# Patient Record
Sex: Female | Born: 1945 | Race: White | Hispanic: No | Smoking: Former smoker
Health system: Southern US, Community
[De-identification: ages and names within clinical notes are randomized; demographics above are authoritative.]

## PROBLEM LIST (undated history)

## (undated) DIAGNOSIS — I10 Essential (primary) hypertension: Secondary | ICD-10-CM

## (undated) DIAGNOSIS — M1990 Unspecified osteoarthritis, unspecified site: Secondary | ICD-10-CM

## (undated) DIAGNOSIS — J449 Chronic obstructive pulmonary disease, unspecified: Secondary | ICD-10-CM

## (undated) DIAGNOSIS — G4730 Sleep apnea, unspecified: Secondary | ICD-10-CM

## (undated) DIAGNOSIS — Z87442 Personal history of urinary calculi: Secondary | ICD-10-CM

## (undated) DIAGNOSIS — T884XXA Failed or difficult intubation, initial encounter: Secondary | ICD-10-CM

## (undated) DIAGNOSIS — K219 Gastro-esophageal reflux disease without esophagitis: Secondary | ICD-10-CM

## (undated) HISTORY — PX: BREAST EXCISIONAL BIOPSY: SUR124

## (undated) HISTORY — PX: BACK SURGERY: SHX140

## (undated) HISTORY — PX: COLONOSCOPY: SHX174

## (undated) HISTORY — PX: CHOLECYSTECTOMY: SHX55

## (undated) HISTORY — PX: SPLENECTOMY, TOTAL: SHX788

## (undated) HISTORY — PX: FRACTURE SURGERY: SHX138

## (undated) HISTORY — PX: EYE SURGERY: SHX253

## (undated) HISTORY — PX: TRUNK SKIN LESION EXCISIONAL BIOPSY: SUR474

---
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
## 1969-05-10 HISTORY — PX: BREAST BIOPSY: SHX20

## 2004-07-18 IMAGING — CR DG CHEST 1V PORT
1 series · 1 of 1 positions shown · non-contrast
Comparison: [DATE].

CLINICAL DATA: Weakness.  Lung lesion. 
PORTABLE CHEST:

[view not recorded]
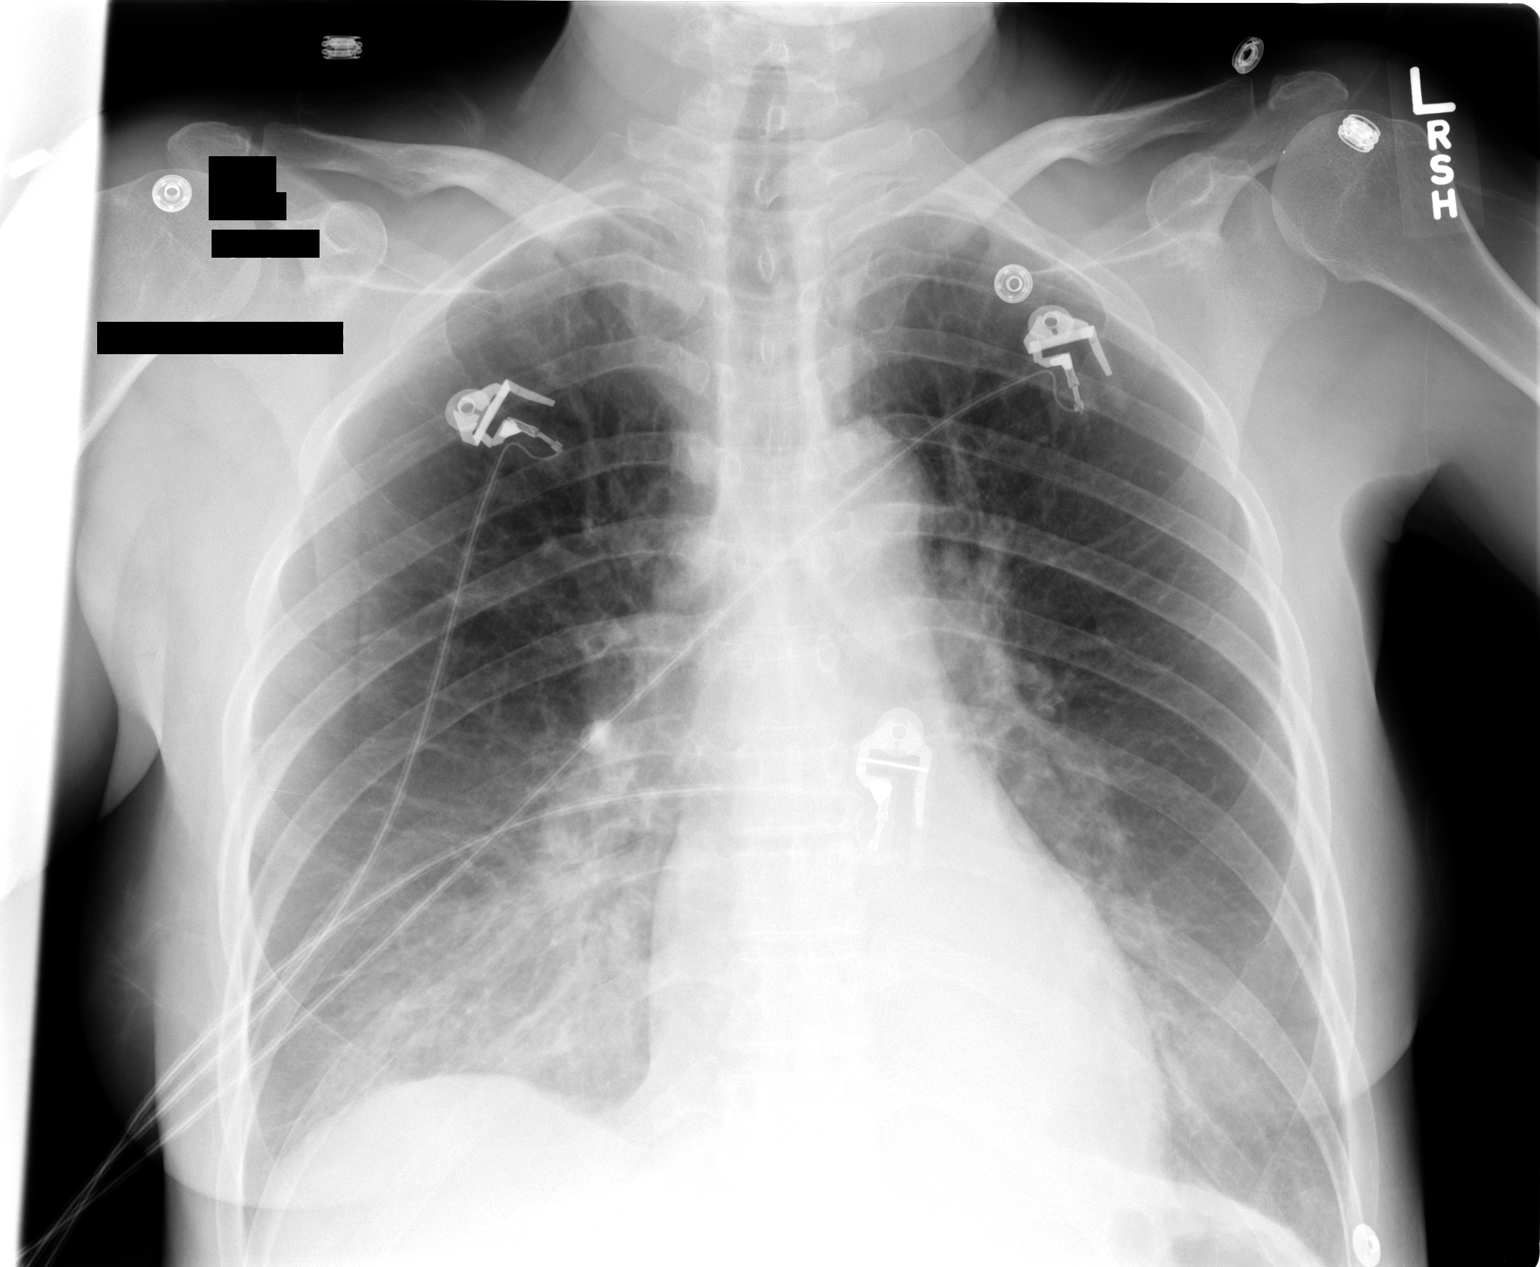

[1 of 1 positions shown; findings below may reference images not displayed]

Bilateral lower lung atelectasis / airspace disease noted.  Cardiomediastinal silhouette is stable.  There has been no interval change since the prior study.
IMPRESSION: Stable chest.

## 2005-02-01 ENCOUNTER — Ambulatory Visit: Payer: Self-pay | Admitting: Internal Medicine

## 2005-02-01 IMAGING — CT CT CHEST-ABD W/ CM
1 series · 15 of 32 positions shown, 19 images · non-contrast
Comparison: none

REASON FOR EXAM: RT sided pleurisy with pain
COMMENTS:

[Series 2: soft tissue · axial · 0.66mm/px · z∈[-610,-300]mm · 15 of 71 slices shown, 19 images]
[im 6/71  mediastinal]
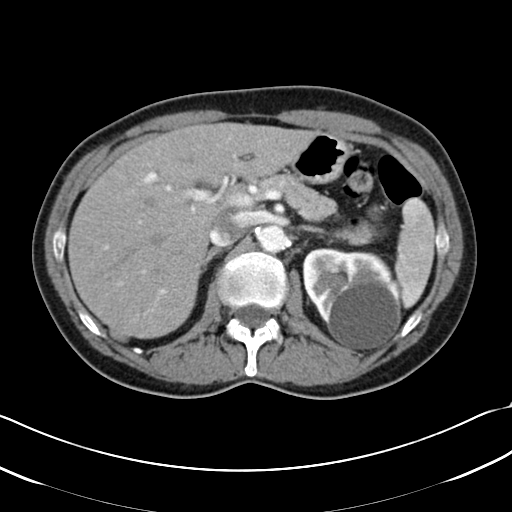
[im 6/71  bone]
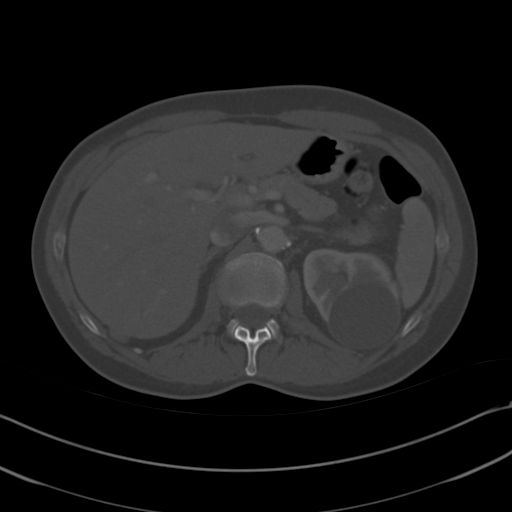
[im 11/71  mediastinal]
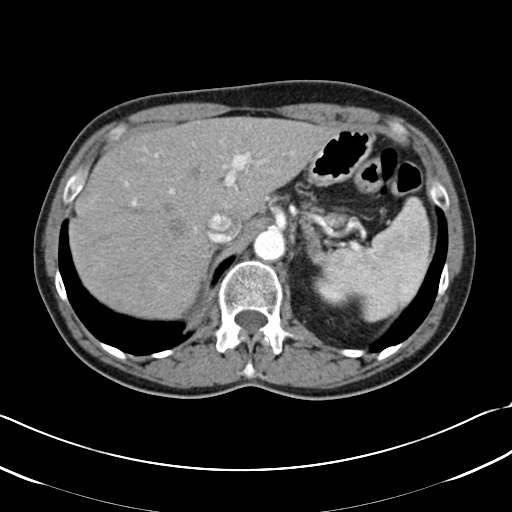
[im 15/71  mediastinal]
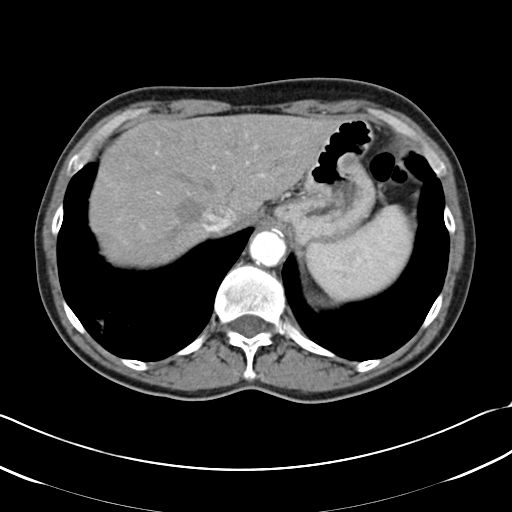
[im 21/71  mediastinal]
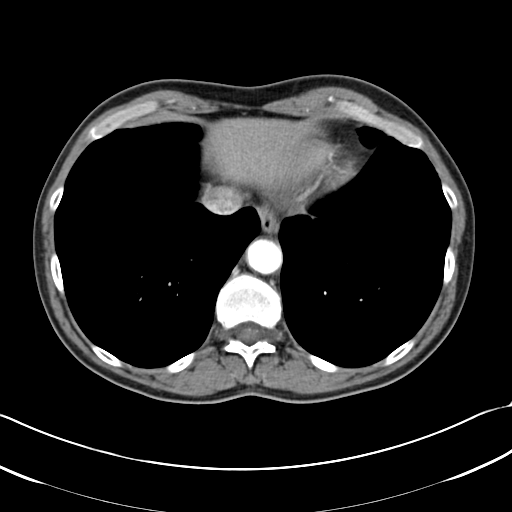
[im 26/71  mediastinal]
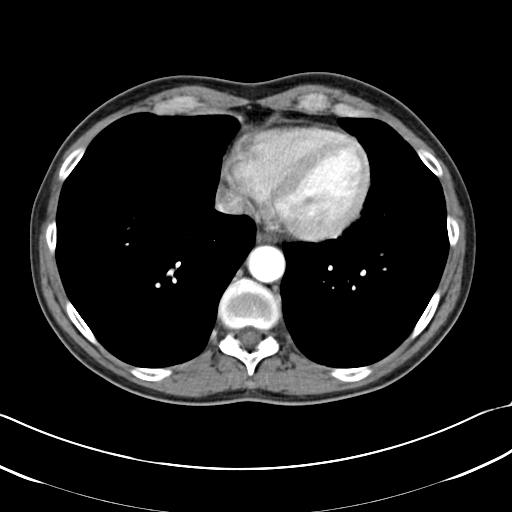
[im 32/71  mediastinal]
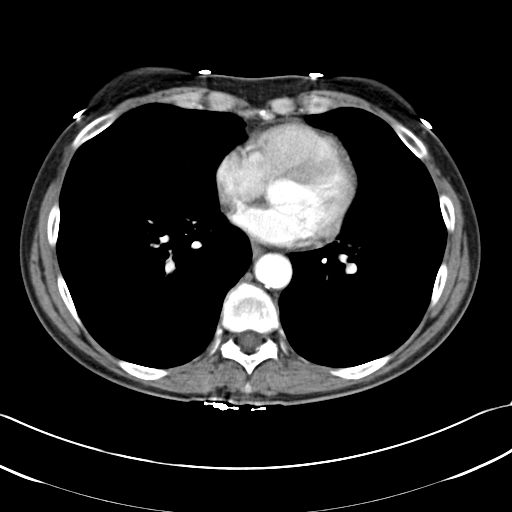
[im 38/71  mediastinal]
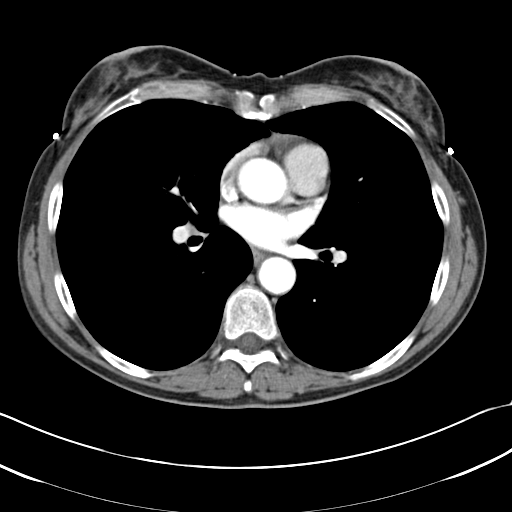
[im 42/71  mediastinal]
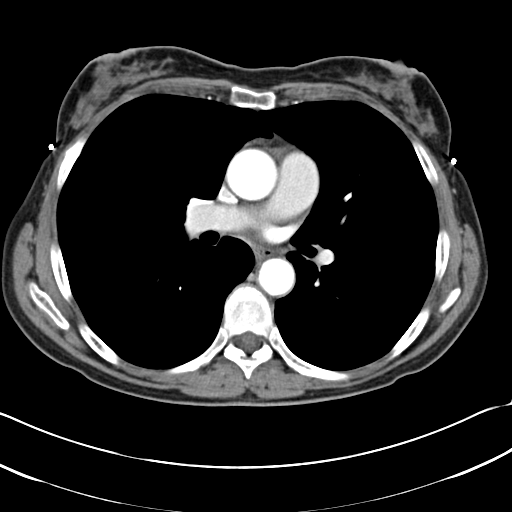
[im 45/71  mediastinal]
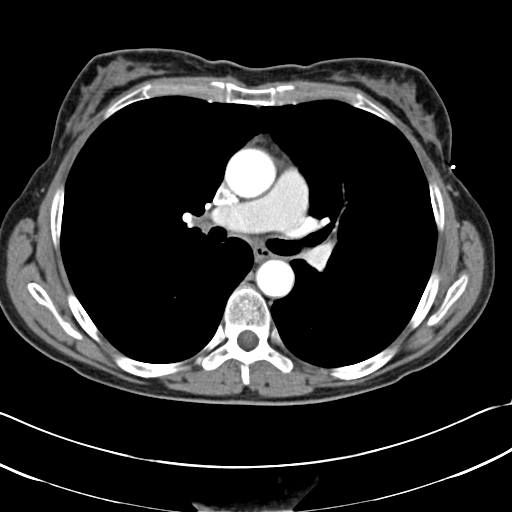
[im 45/71  bone]
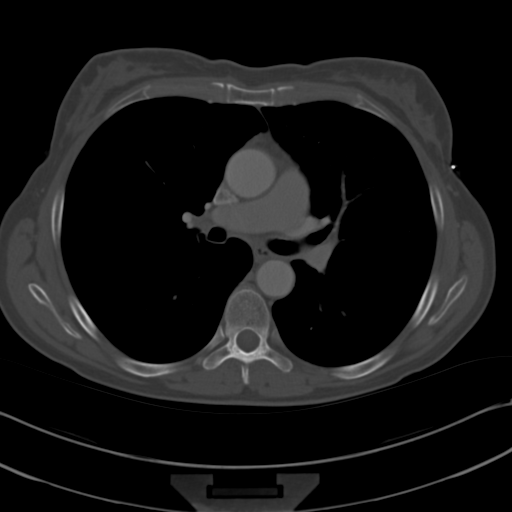
[im 50/71  mediastinal]
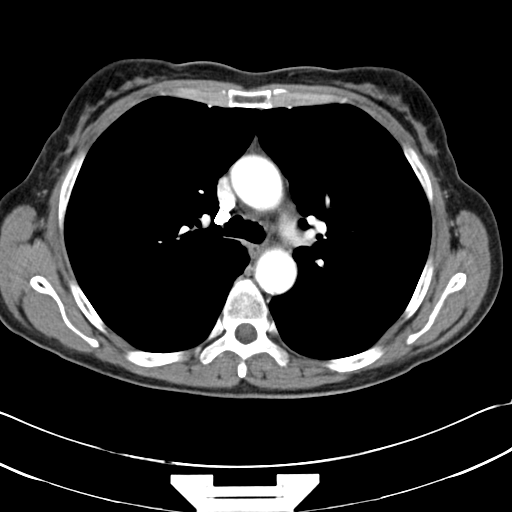
[im 57/71  mediastinal]
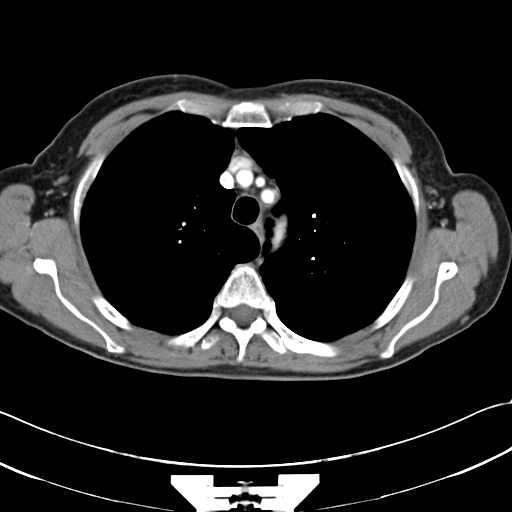
[im 60/71  mediastinal]
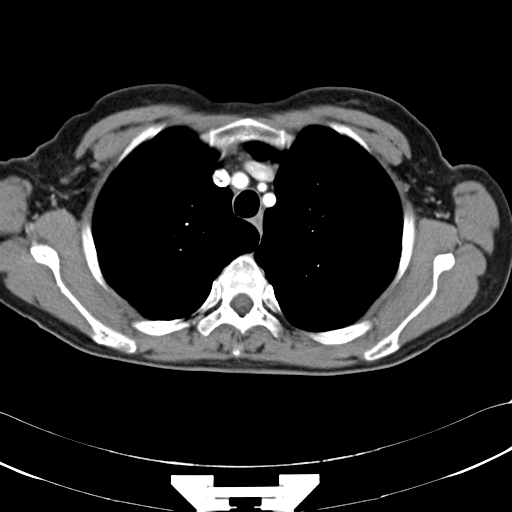
[im 60/71  lung]
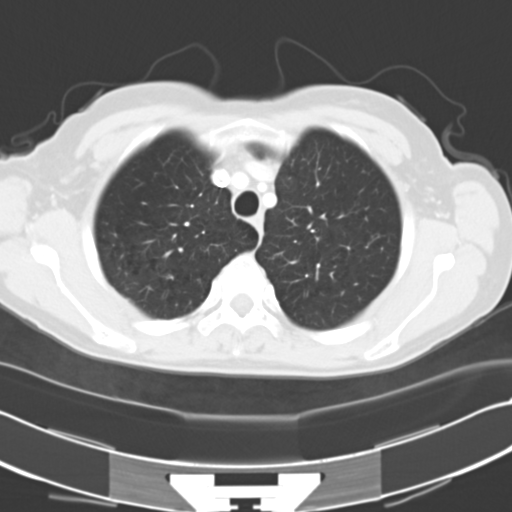
[im 63/71  lung]
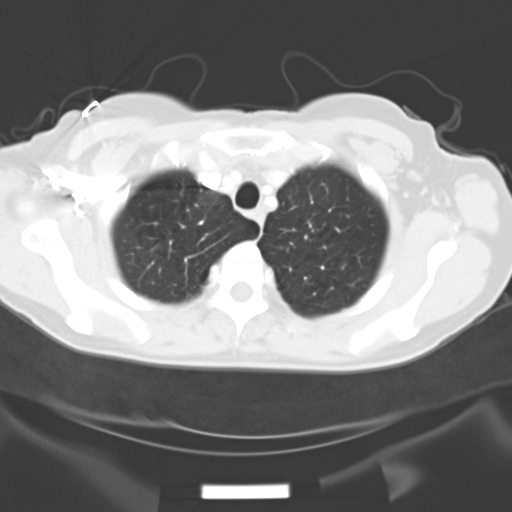
[im 65/71  mediastinal]
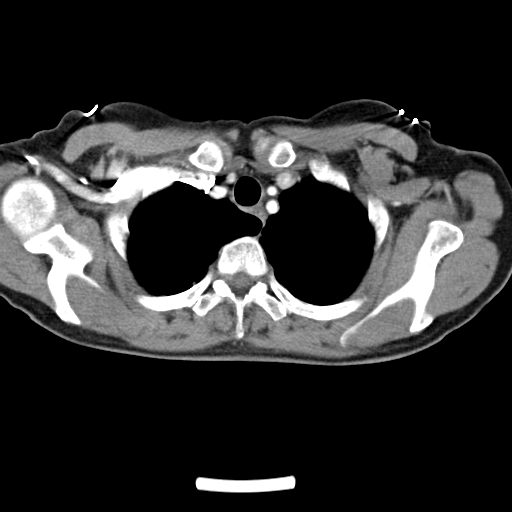
[im 65/71  lung]
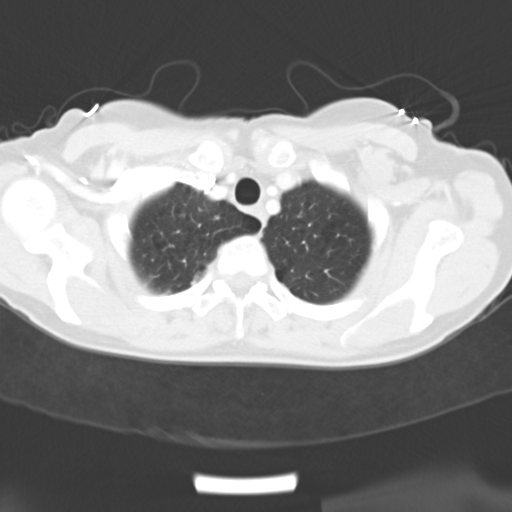
[im 68/71  lung]
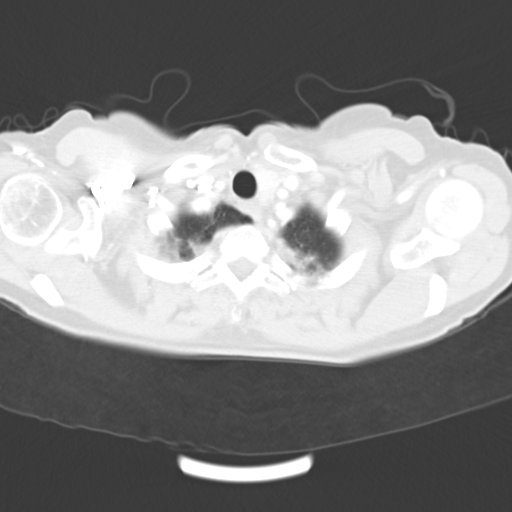

[15 of 32 positions shown; findings below may reference images not displayed]

PROCEDURE:     CT  - CT CHEST COMPLEX WITH CONTRAST  - [DATE]  [DATE]

RESULT:        Comparison is made to the study of [DATE].    CT of the chest
with IV contrast is performed along with high-resolution lung window images.
 The heart appears to be normal in size. There is no pleural effusion or
pericardial effusion evident.  There is a well-circumscribed low density
lesion within the mid to upper pole region of the LEFT kidney suggestive of
a cyst measuring approximately 4.6 cm in maximal diameter. The upper
abdominal viscera otherwise appear to be grossly normal.  There is a band of
density at the RIGHT lung base in the RIGHT lower lobe in a subpleural
location or pleural-based location which has an appearance suggestive of
possible fibrosis. An old tiny pulmonary infarct could give a similar
appearance.  Some minimal fibrosis is seen in the RIGHT costophrenic angle
region and in the RIGHT paraspinal region in the lower lobe as well.  No
other areas of possible infarct or significant fibrosis are seen aside from
some changes in the lung apices where there are some small bulla and some
areas of fibrotic banding present.  There is no focal infiltrate.  There is
no bronchiectasis.  No septal thickening or interstitial edema is seen.
IMPRESSION: 1.     The area of subtle subpleural density in the RIGHT lung base is
slightly more prominent than seen on the prior study.  The possibility of an
enlarging mass cannot be completely excluded.  The patient may benefit from
a CT PET to confirm absence of neoplasm or the presence of neoplasm.
2.     Some minimal nodular at the RIGHT lung base that appears to be
pleural based along with some other areas suggestive of fibrosis.  The area
may be secondary to a small infarct or an old area of fibrosis.  No evidence
of mediastinal or hilar mass.
3.     Incidental finding of an upper pole LEFT renal cyst.
4.     Cholecystectomy clips noted.

## 2005-02-07 ENCOUNTER — Ambulatory Visit: Payer: Self-pay | Admitting: Physical Medicine & Rehabilitation

## 2005-02-07 ENCOUNTER — Inpatient Hospital Stay (HOSPITAL_COMMUNITY): Admission: EM | Admit: 2005-02-07 | Discharge: 2005-02-19 | Payer: Self-pay | Admitting: Emergency Medicine
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
## 2005-02-08 IMAGING — CR DG FOOT COMPLETE 3+V*R*
3 series · 3 of 3 positions shown · non-contrast
Comparison: none

CLINICAL DATA: Ankle dislocation. 
 FOOT ? 3 VIEWS: 
 Through SAA the previously identified subtalar dislocation has been reduced.  There is near anatomic alignment visualized on these three films.

[view not recorded (1 of 3)]
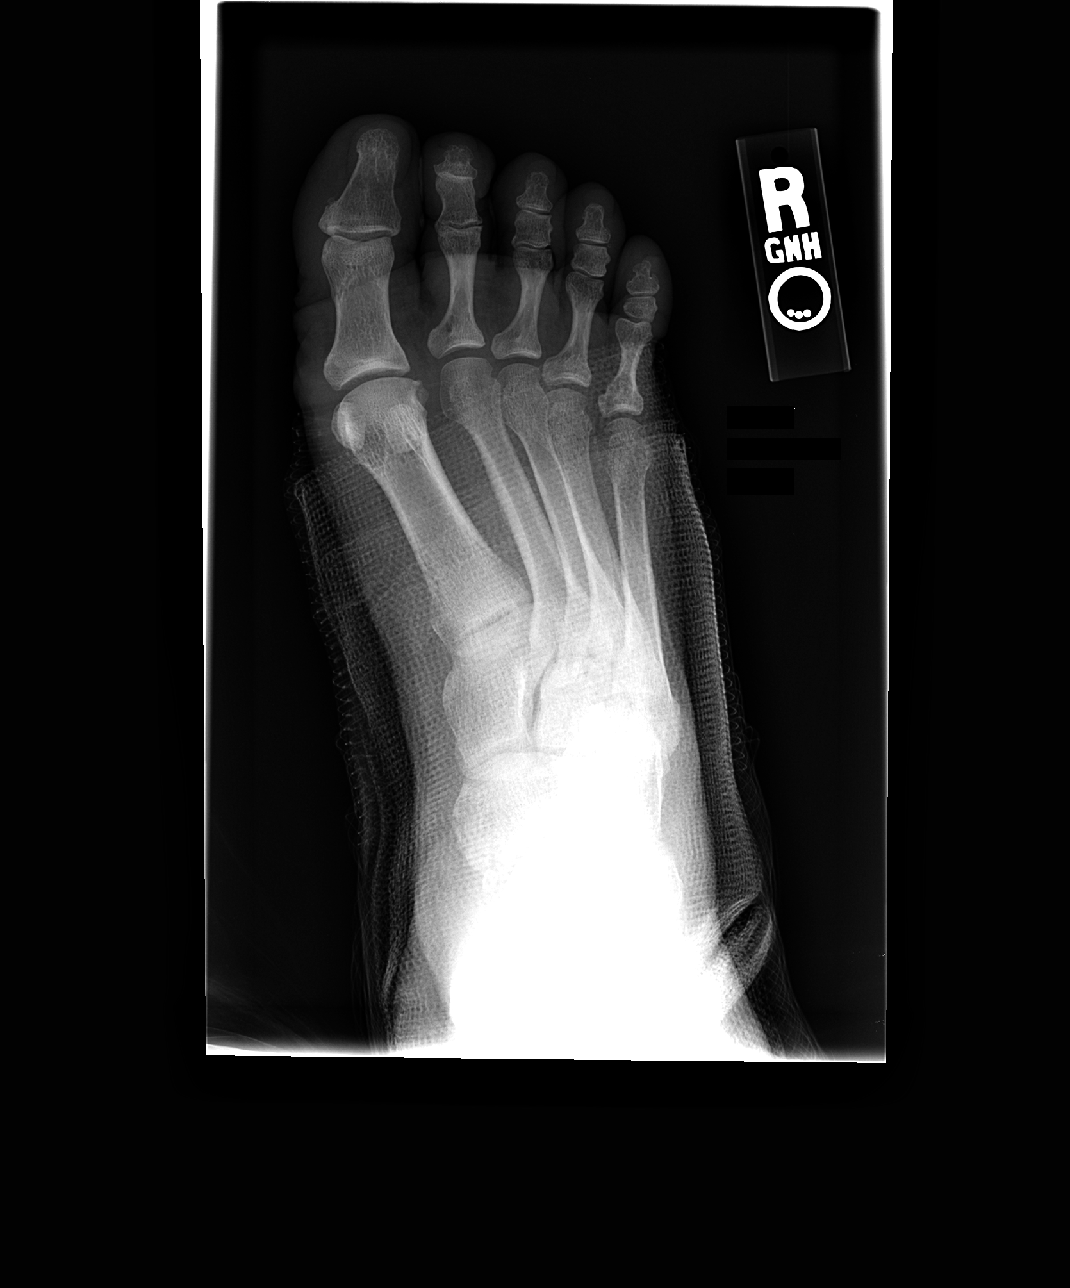

[view not recorded (2 of 3)]
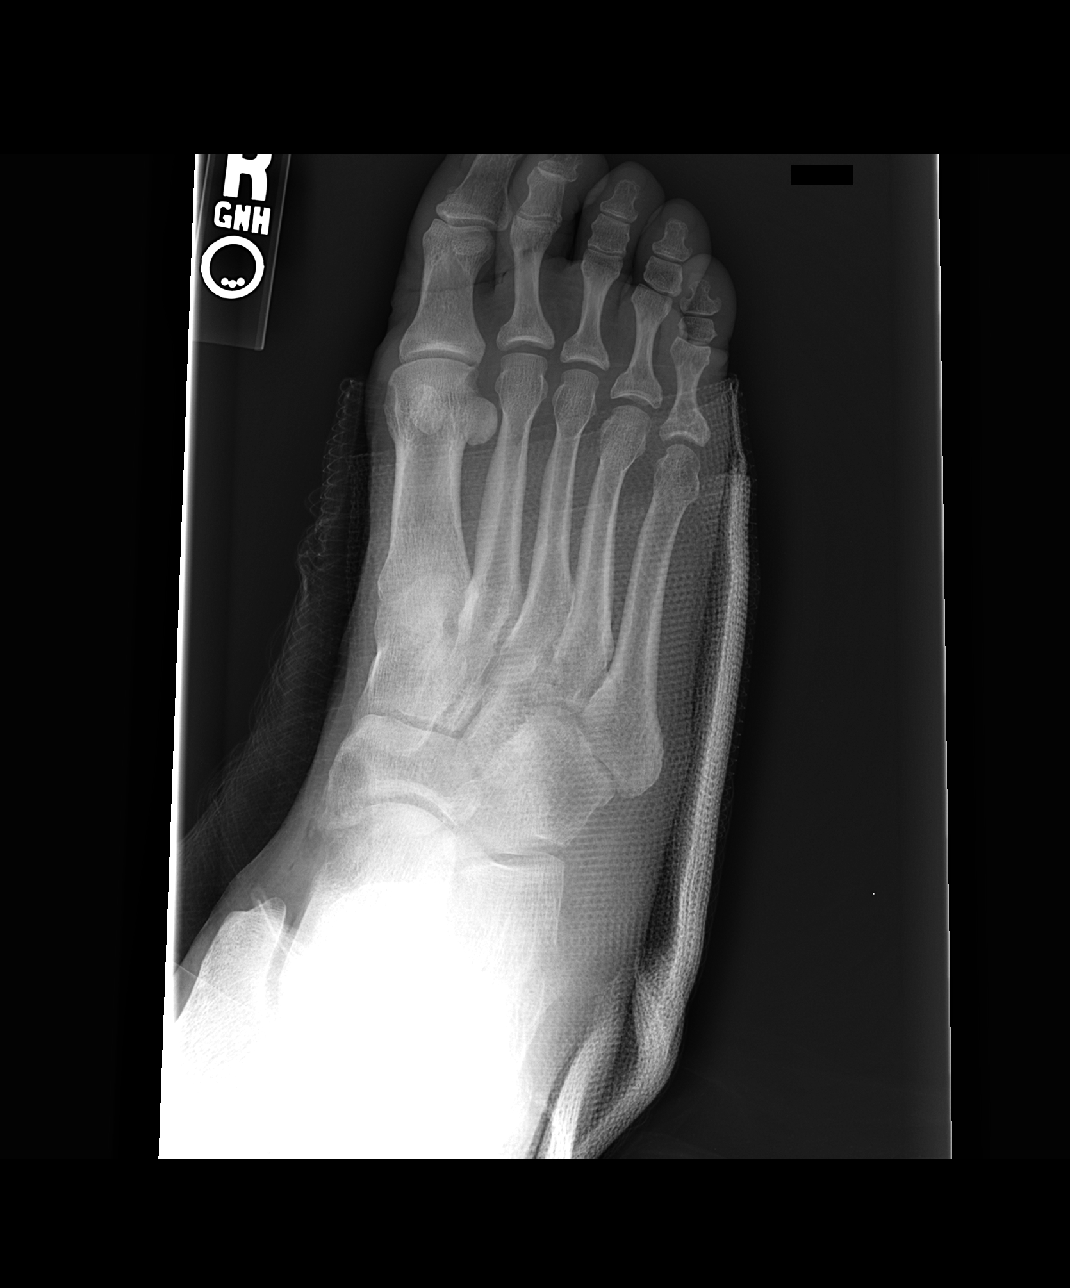

[view not recorded (3 of 3)]
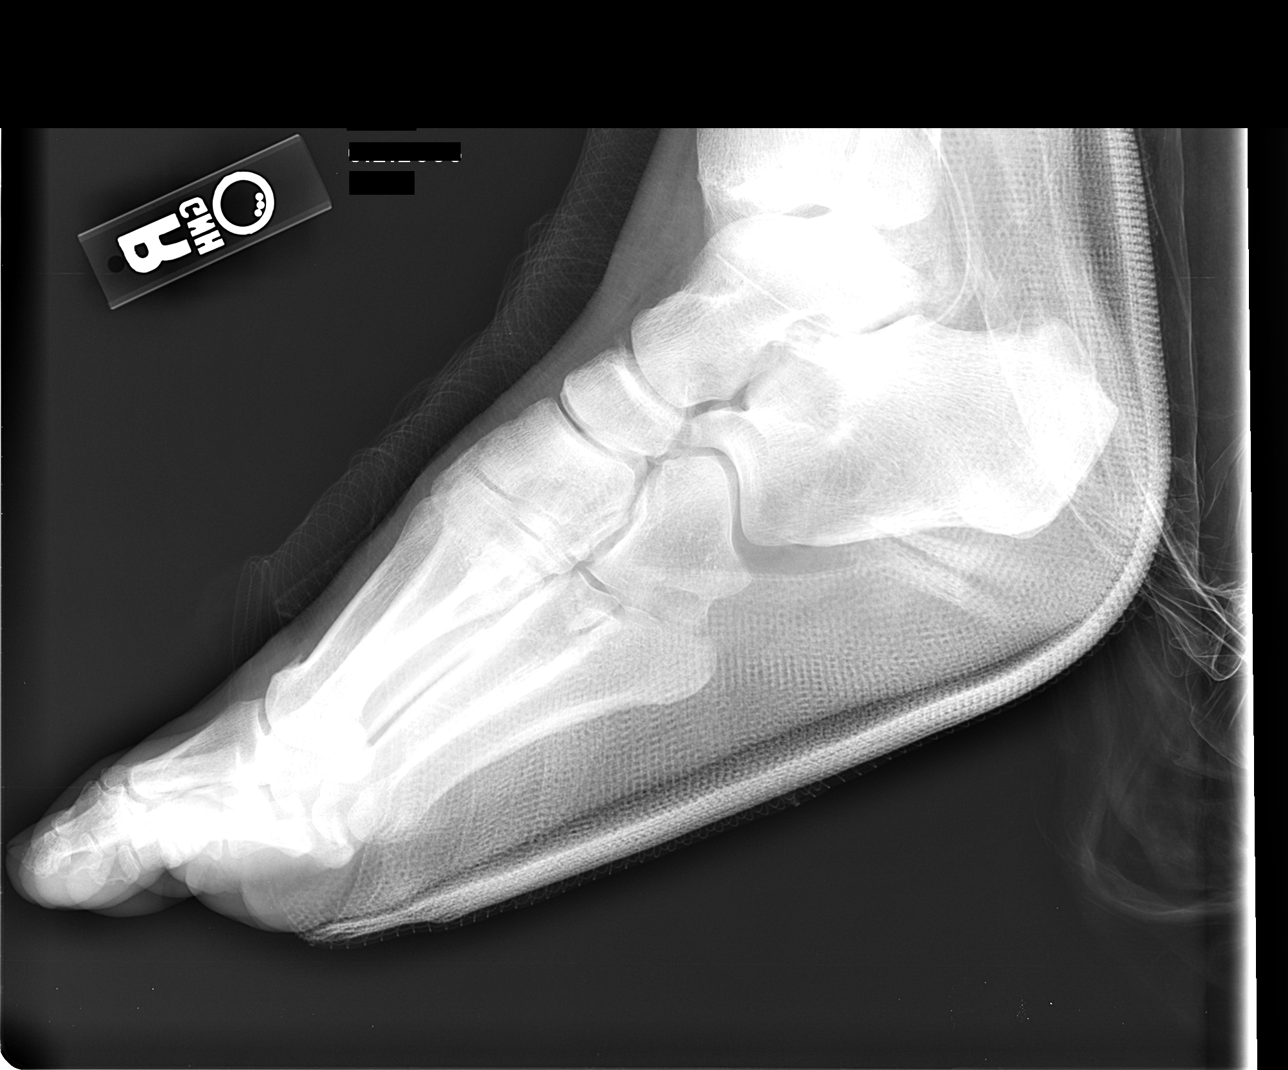

[3 of 3 positions shown; findings below may reference images not displayed]

IMPRESSION: Satisfactory appearance status-post reduction of subtalar dislocation.  
 ANKLE ? 3 VIEWS: 
 Through SAA there has been near anatomic restoration of subtalar dislocation.  Fractures of the lateral malleolus and talus are again noted.
IMPRESSION: Improved appearance status-post closed reduction.

## 2005-02-08 IMAGING — CR DG ANKLE PORT 2V*R*
3 series · 3 of 3 positions shown · non-contrast
Comparison: none

CLINICAL DATA: Ankle dislocation. 
 FOOT ? 3 VIEWS: 
 Through SAA the previously identified subtalar dislocation has been reduced.  There is near anatomic alignment visualized on these three films.

[view not recorded (1 of 3)]
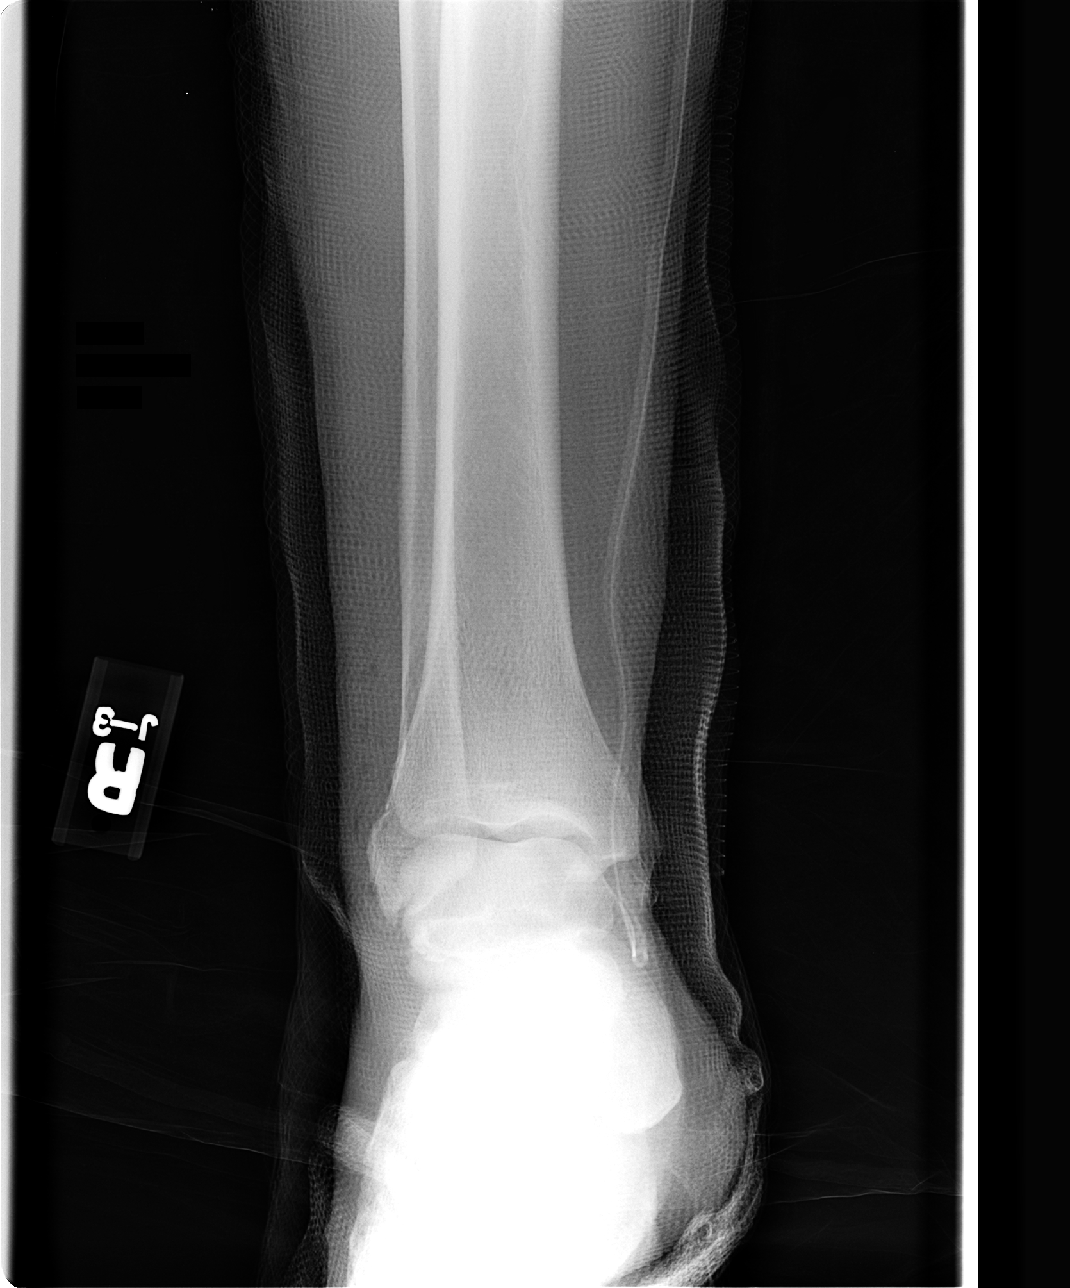

[view not recorded (2 of 3)]
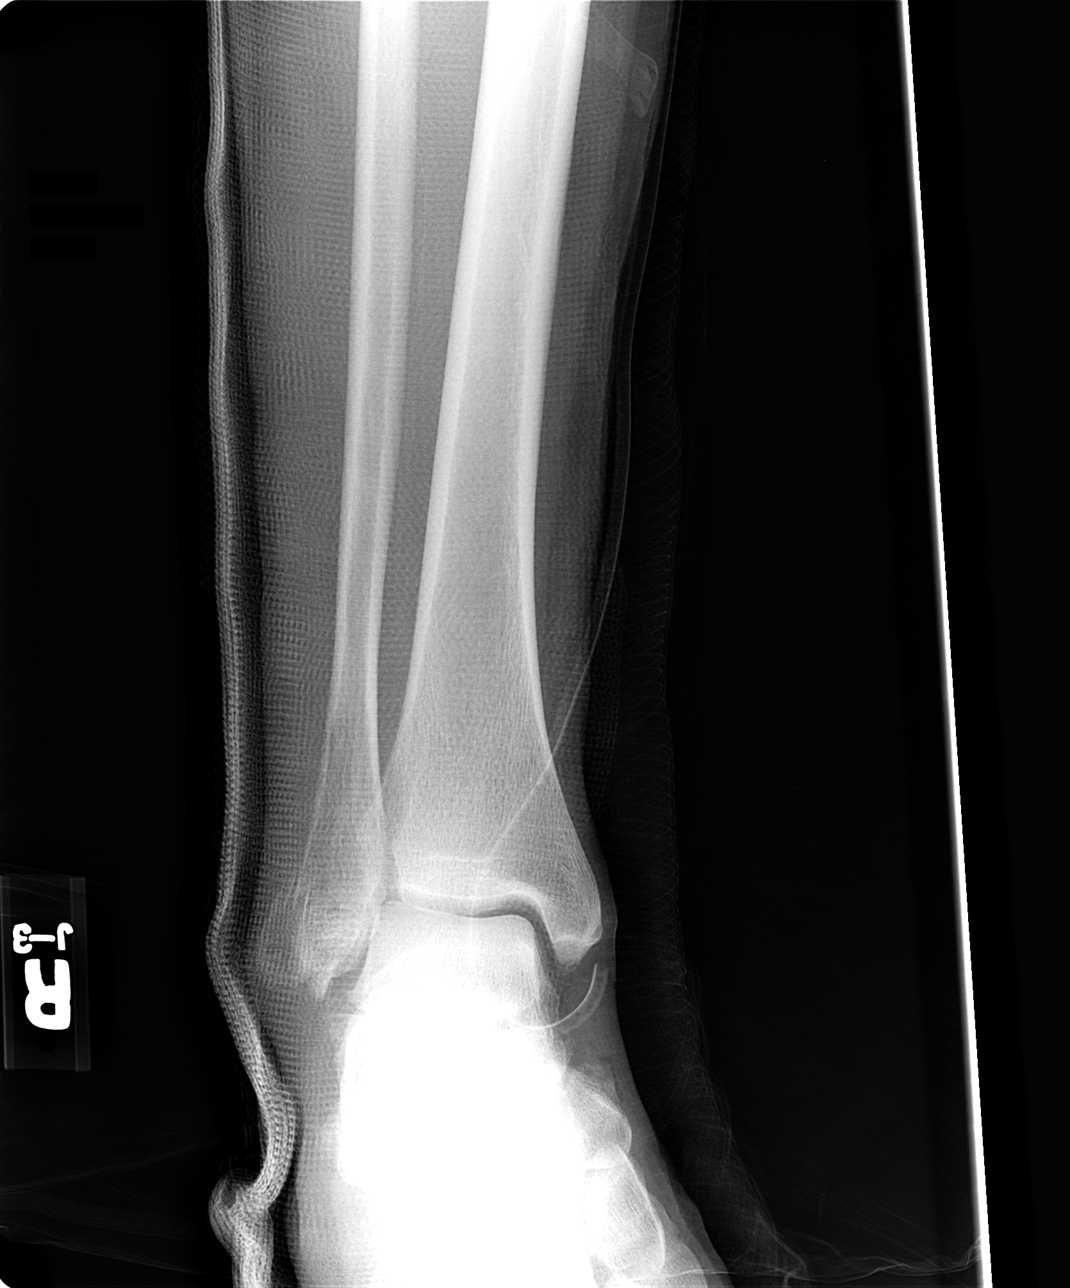

[view not recorded (3 of 3)]
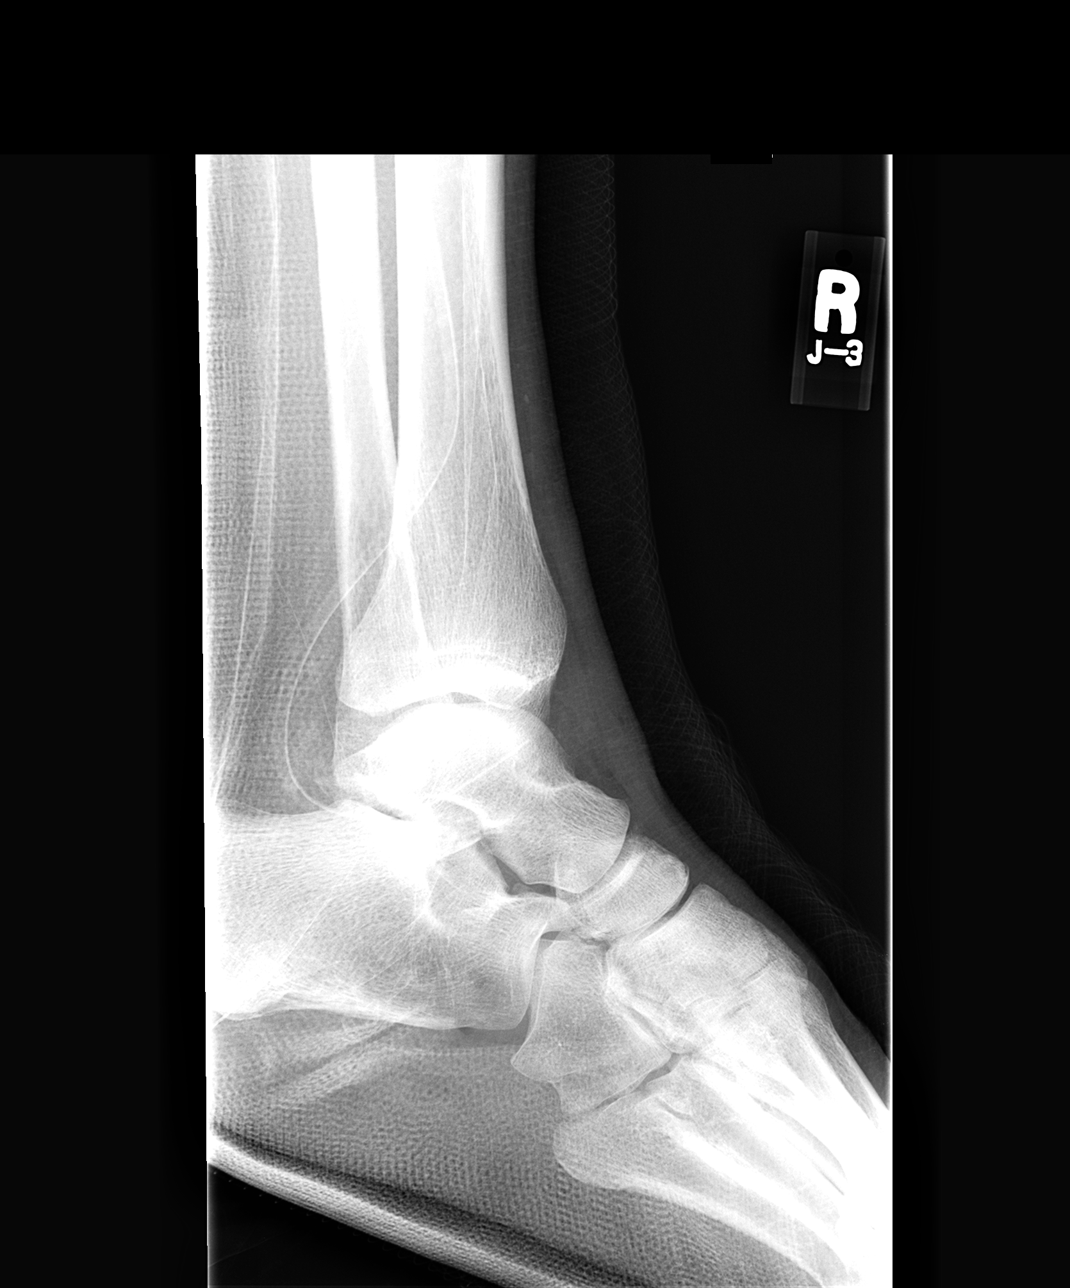

[3 of 3 positions shown; findings below may reference images not displayed]

IMPRESSION: Satisfactory appearance status-post reduction of subtalar dislocation.  
 ANKLE ? 3 VIEWS: 
 Through SAA there has been near anatomic restoration of subtalar dislocation.  Fractures of the lateral malleolus and talus are again noted.
IMPRESSION: Improved appearance status-post closed reduction.

## 2005-02-09 IMAGING — CR DG CHEST 2V
2 series · 2 of 2 positions shown · non-contrast
Comparison: none

CLINICAL DATA: Short of breath.  Surgery for ankle dislocation.
 CHEST ? 2 VIEW:
 Two views of the chest are compared to a chest x-ray of [DATE].  There are now bilateral small pleural effusions present.  Pneumonia is difficult to exclude.  The lungs appear slightly hyperaerated.  The heart is within normal limits in size.

[w chest lat]
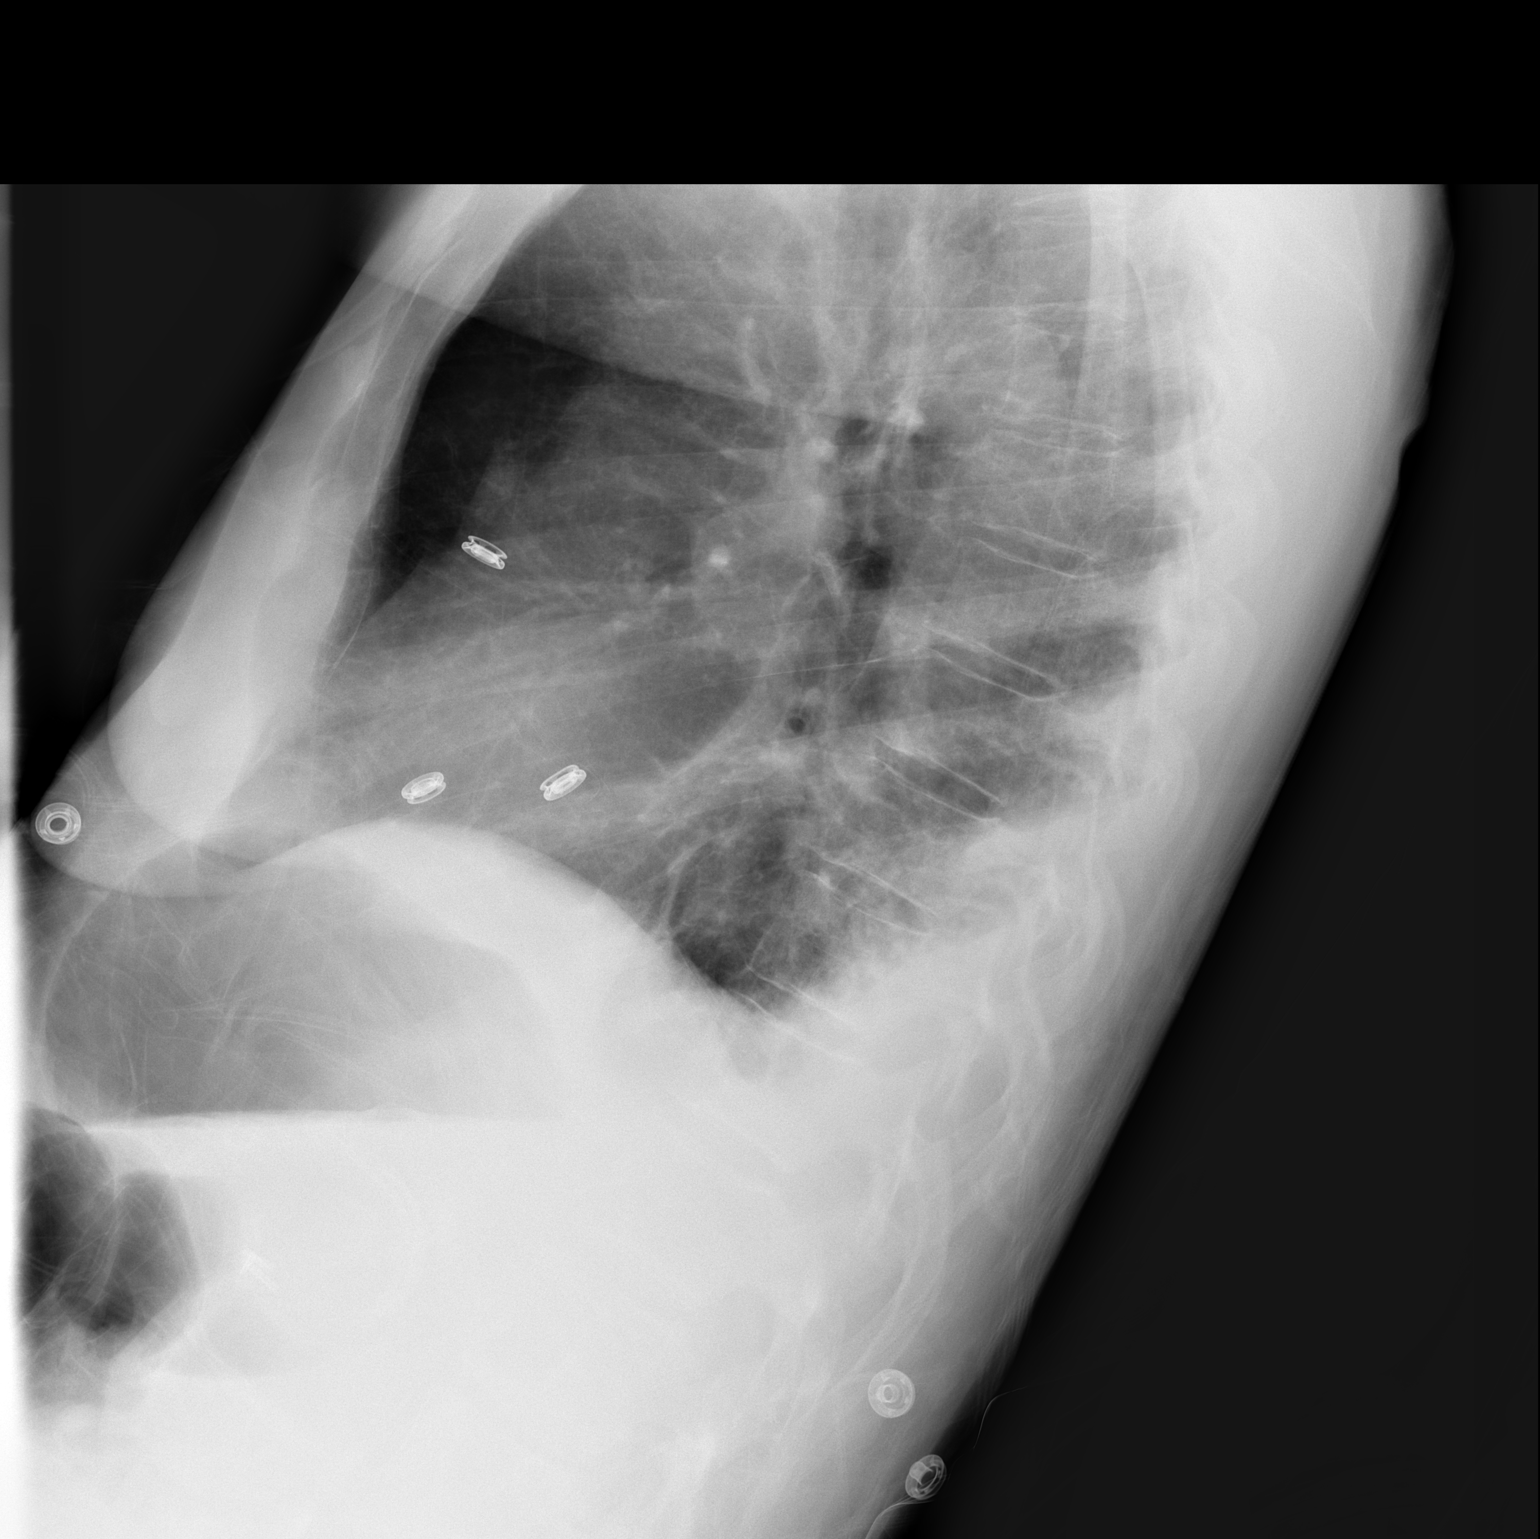

[view not recorded]
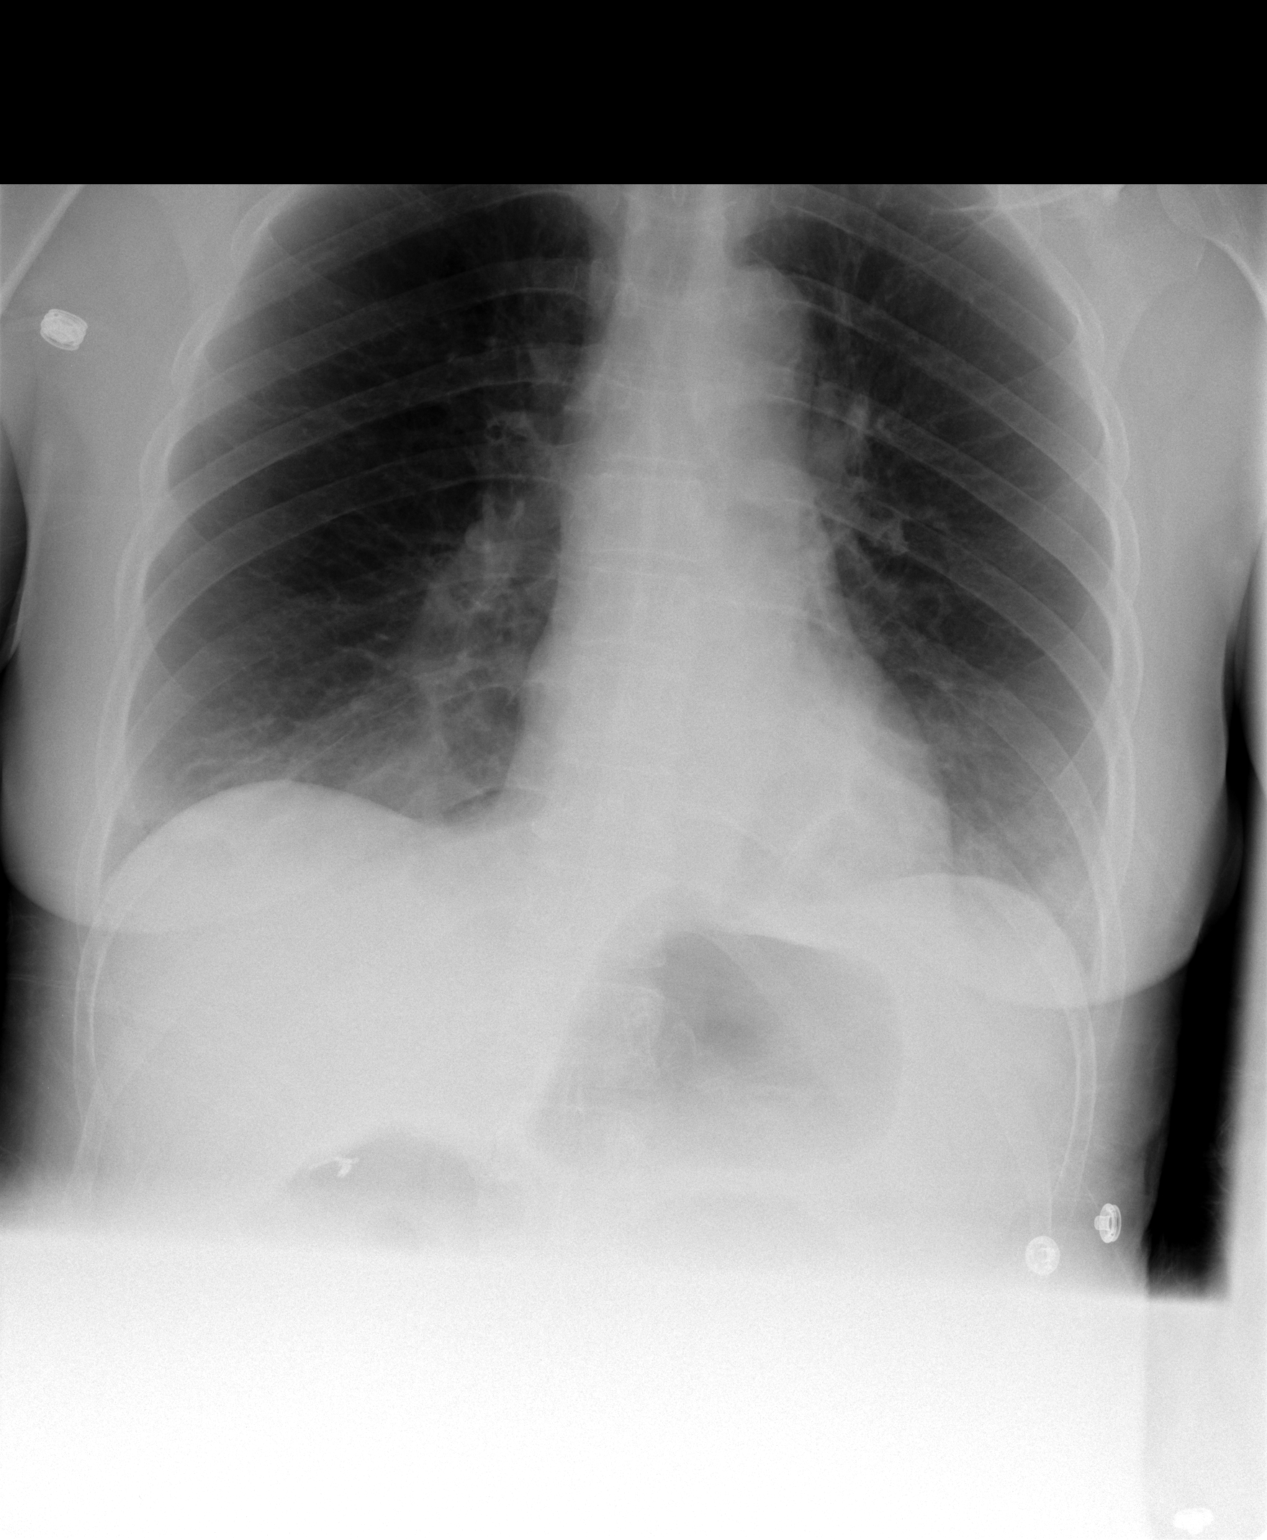

[2 of 2 positions shown; findings below may reference images not displayed]

IMPRESSION: New small pleural effusion and basilar volume loss.  COPD.

## 2005-02-09 IMAGING — CR DG FOOT 2V*R*
2 series · 2 of 2 positions shown · non-contrast
Comparison: none

CLINICAL DATA: Right ankle dislocation.
 RIGHT FOOT ? 2 VIEW:
 Two views of the right foot show some lucency through the distal phalanx of the right great toe, and a nondisplaced fracture cannot be excluded, although this area is difficult to assess due to overlying cast material.  No other abnormality is seen.

[view not recorded (1 of 2)]
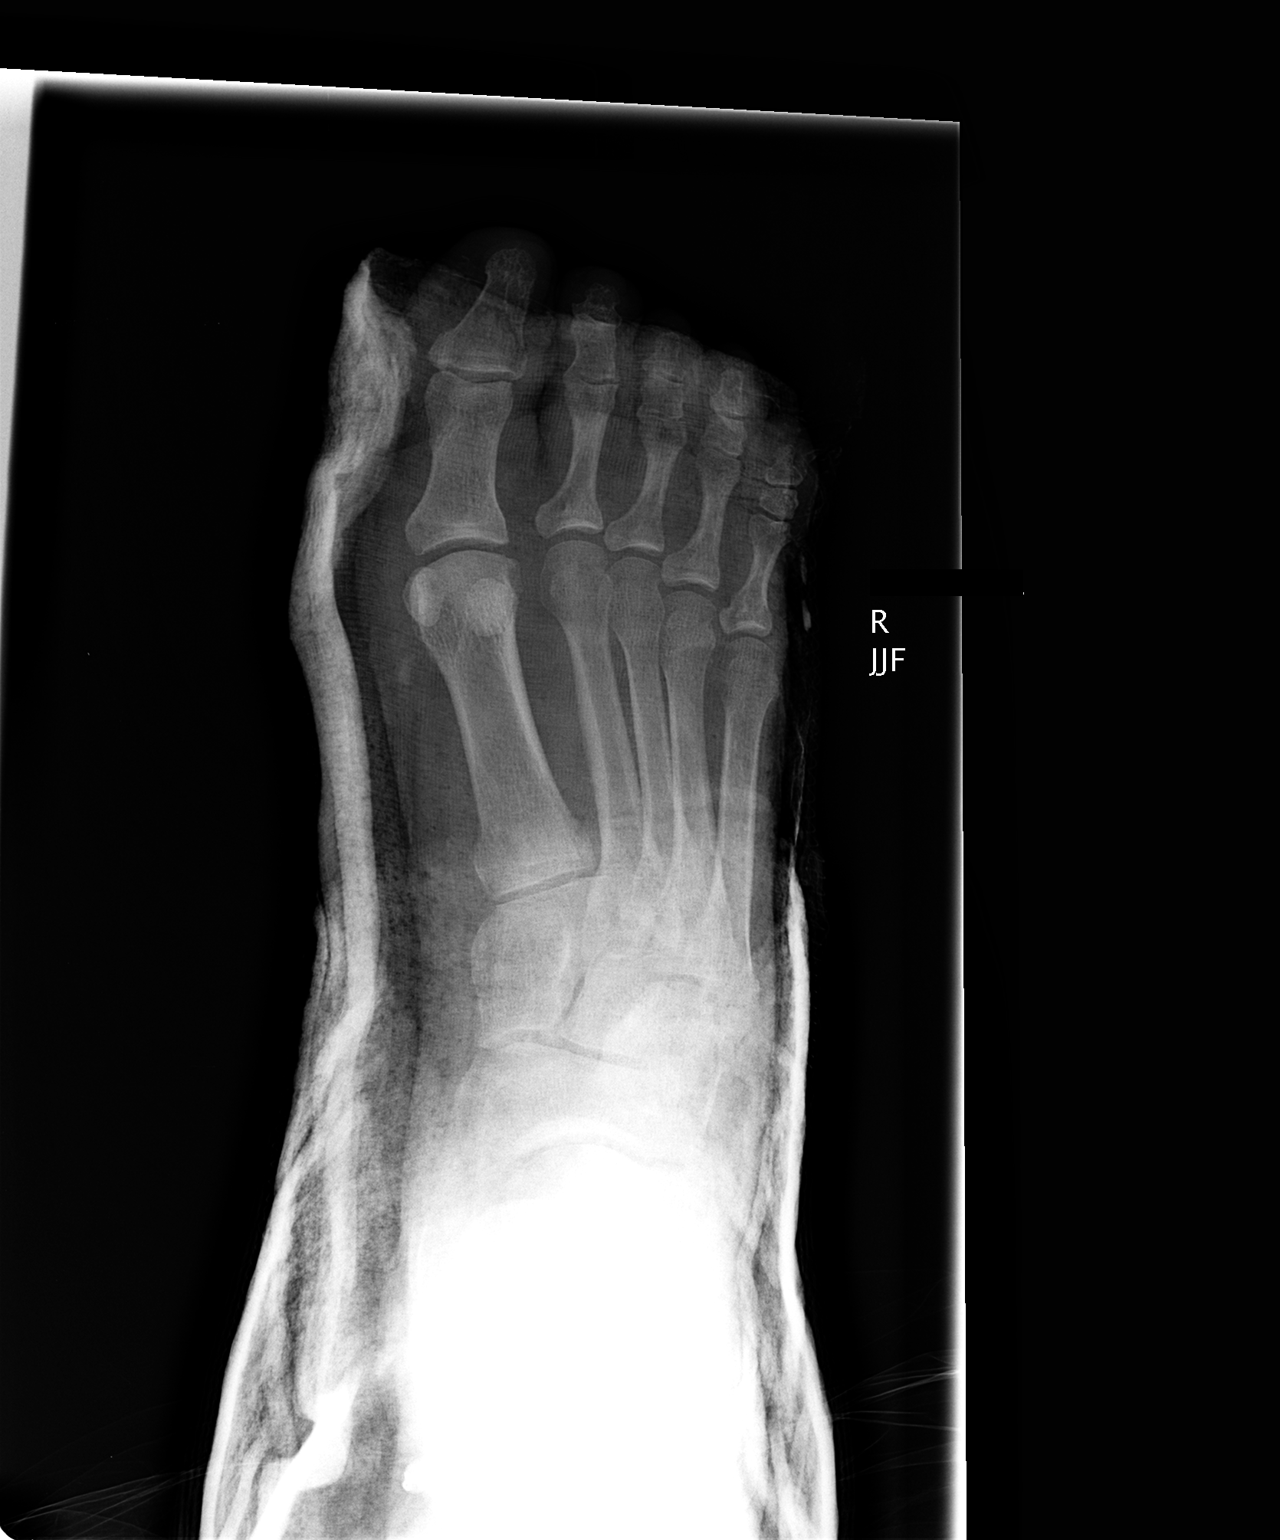

[view not recorded (2 of 2)]
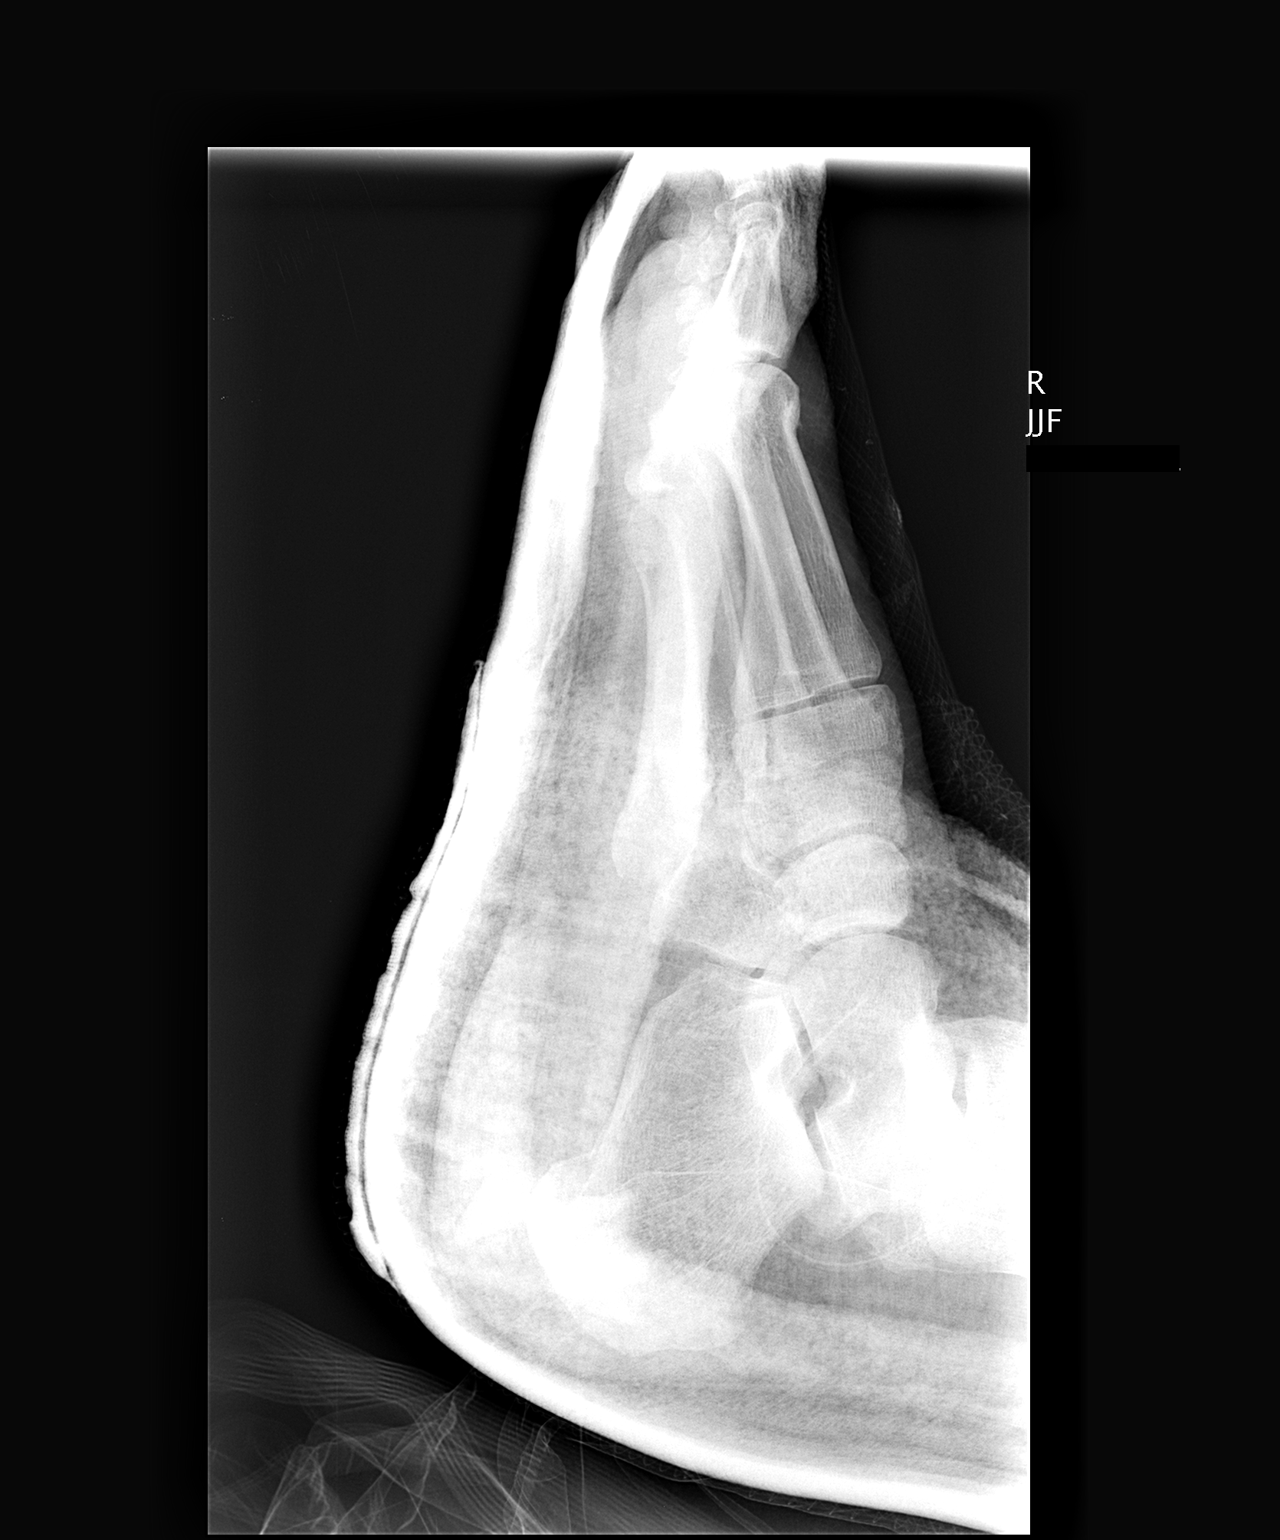

[2 of 2 positions shown; findings below may reference images not displayed]

IMPRESSION: Questionable nondisplaced fracture of the distal phalanx of the right great toe.  Overlying cast obscures detail.

## 2005-02-09 IMAGING — CT CT PELVIS W/ CM
1 of 3 series · 14 of 32 positions shown, 19 images · IV contrast ([ID] OMNI 300)
Comparison: [DATE].

CLINICAL DATA: Follow-up hepatic and splenic lacerations.  Worsening left-sided abdominal pain and nausea.  Probable ileus.
ABDOMEN CT WITH CONTRAST:
TECHNIQUE: Multidetector CT imaging of the abdomen was performed following the standard protocol during bolus administration of intravenous contrast.
Contrast:  100 cc Omnipaque 300.
TECHNIQUE: Multidetector CT imaging of the pelvis was performed following the standard protocol during bolus administration of intravenous contrast.
A moderate to large amount of intraperitoneal blood is seen in the pelvis which is new since the previous study.  A Foley catheter is seen within the urinary bladder.  No other pelvic abnormality is identified.

[Series 2: routine abdomen · axial · 0.75mm/px · z∈[-446,-31]mm · 14 of 93 slices shown, 19 images]
[im 5/93  soft-tissue]
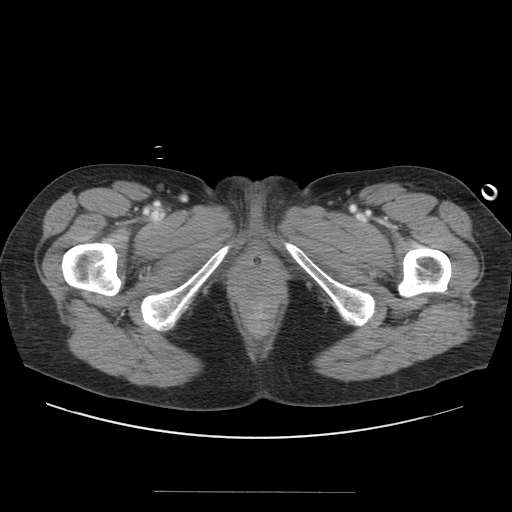
[im 5/93  bone]
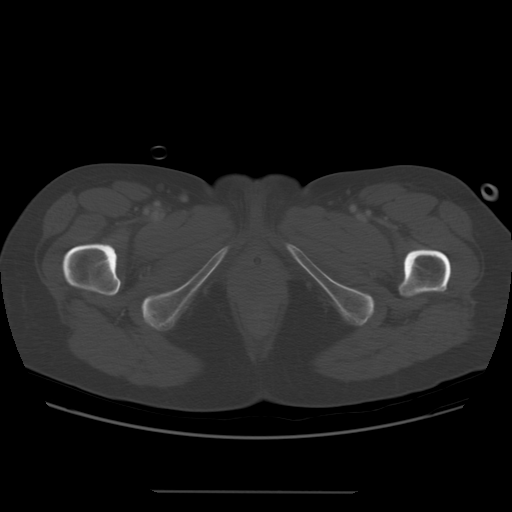
[im 15/93  soft-tissue]
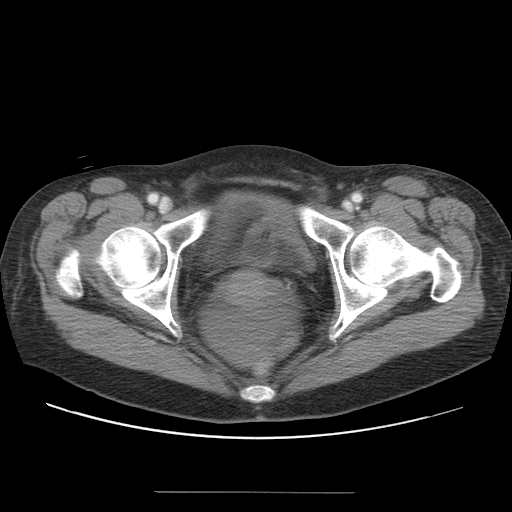
[im 20/93  soft-tissue]
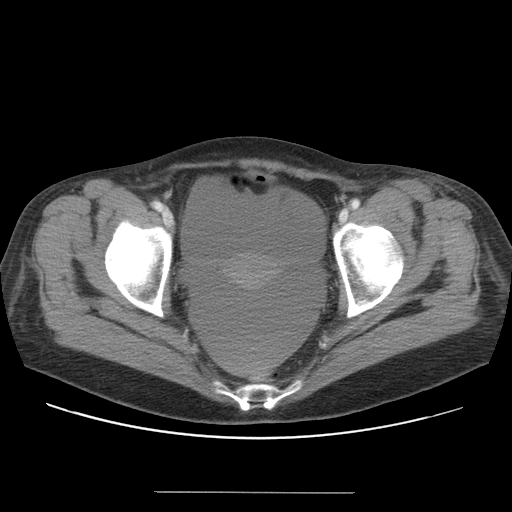
[im 25/93  soft-tissue]
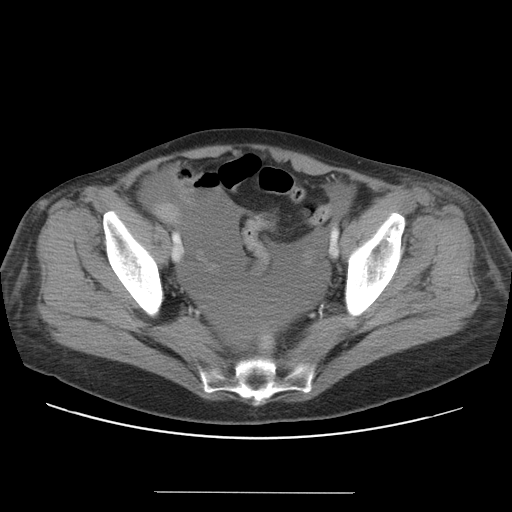
[im 34/93  soft-tissue]
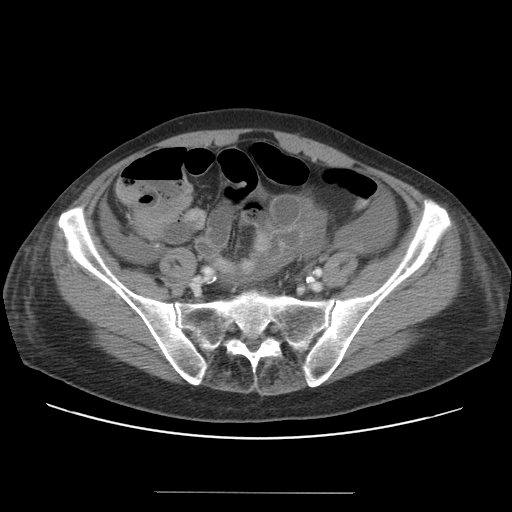
[im 39/93  soft-tissue]
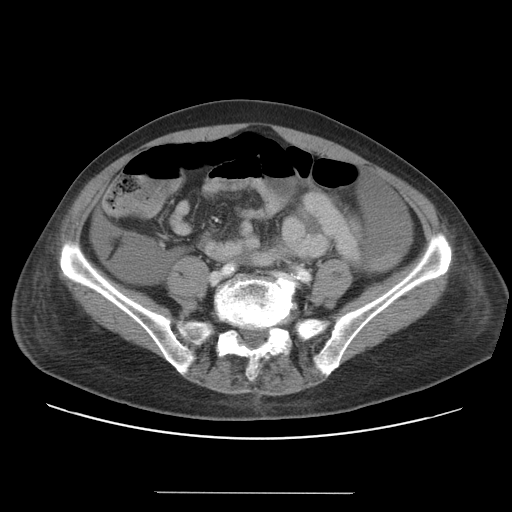
[im 49/93  soft-tissue]
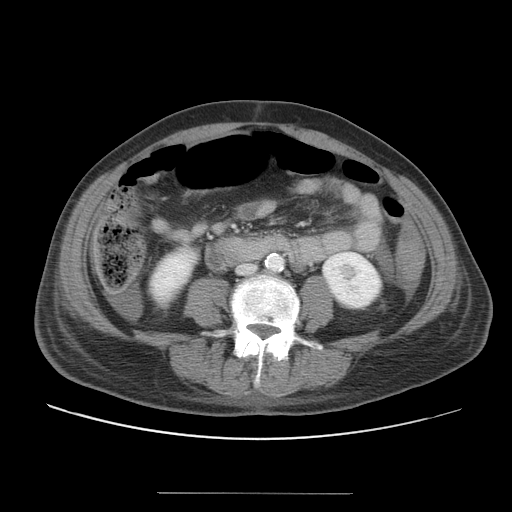
[im 54/93  soft-tissue]
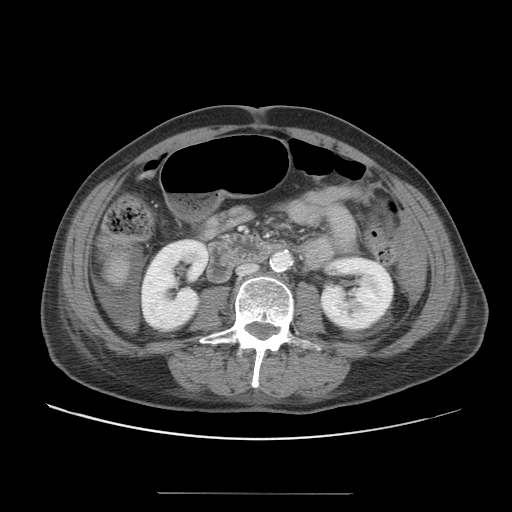
[im 59/93  soft-tissue]
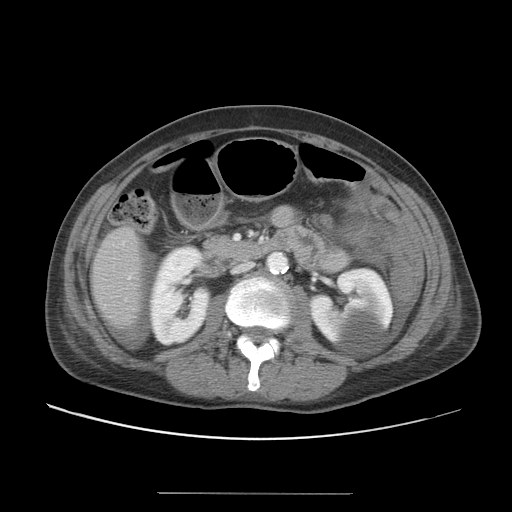
[im 59/93  bone]
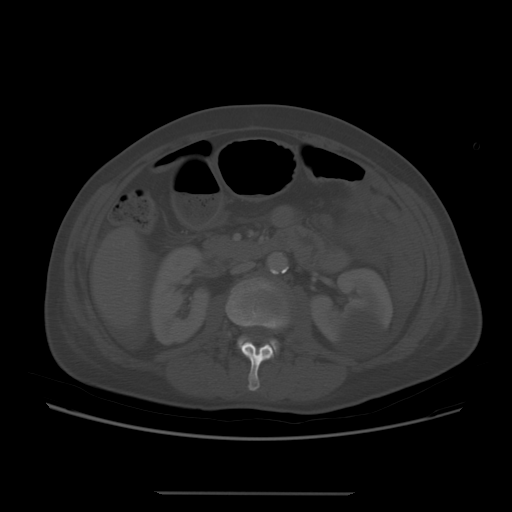
[im 68/93  soft-tissue]
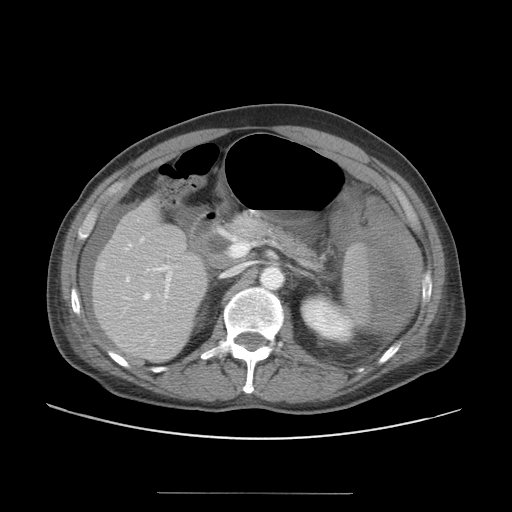
[im 73/93  soft-tissue]
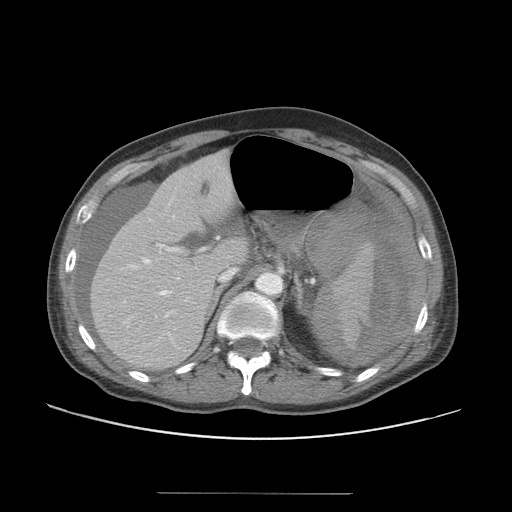
[im 73/93  lung]
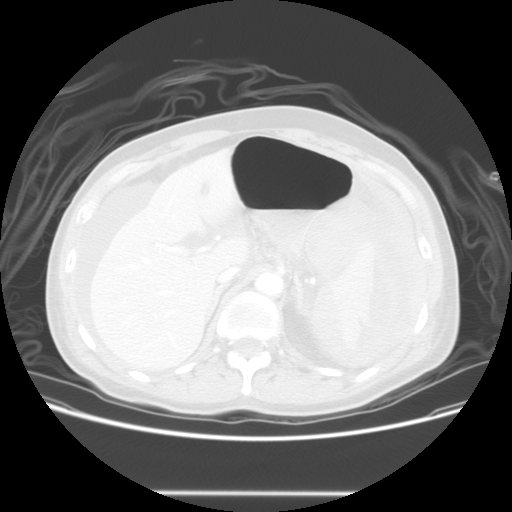
[im 78/93  soft-tissue]
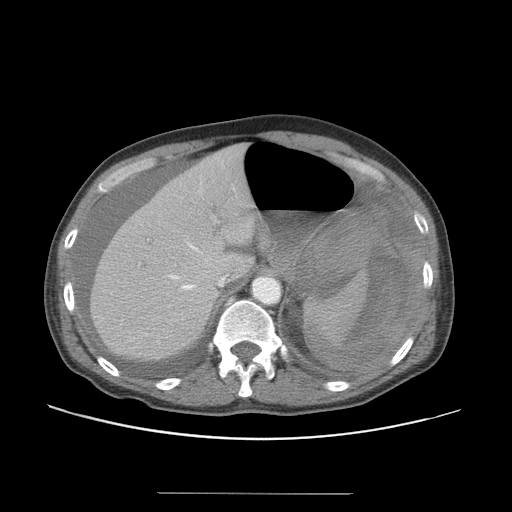
[im 78/93  lung]
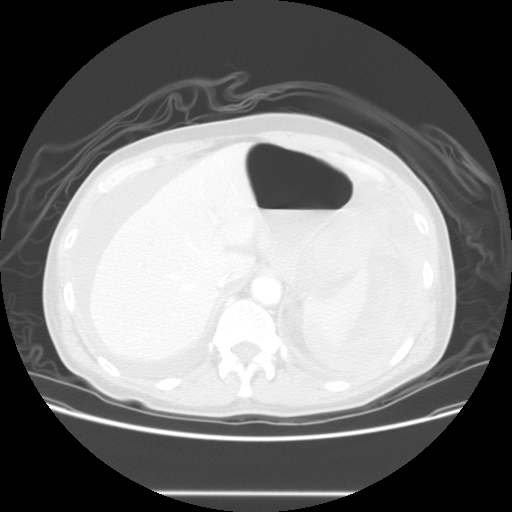
[im 83/93  lung]
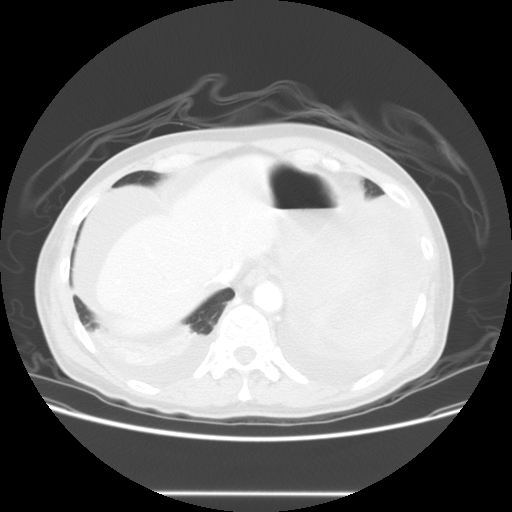
[im 88/93  soft-tissue]
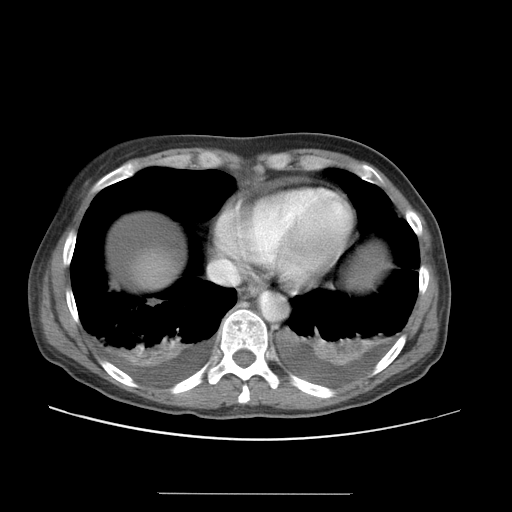
[im 88/93  lung]
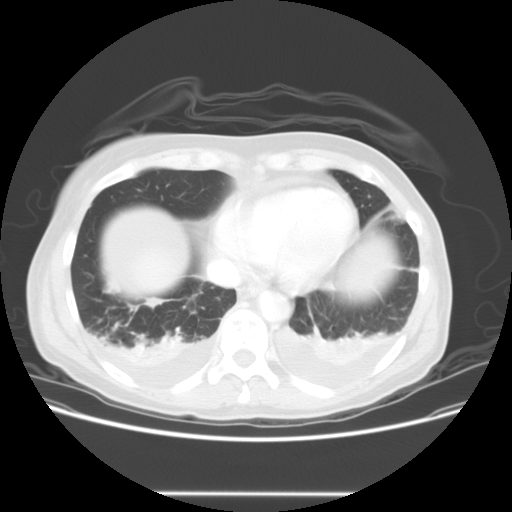

[14 of 32 positions shown; findings below may reference images not displayed]

Again noted is a laceration of the medial aspect of the spleen.  There is now a large amount of perisplenic hematoma which is new since the previous study.  A large amount of perihepatic blood is also seen however, no definite hepatic laceration is visualized on this study.  Small bilateral pleural effusions are also new as well as mild bibasilar atelectasis.
A large left renal cyst remains stable.  No dilated bowel loops are seen.
IMPRESSION: 1.  Splenic laceration is again noted.  Large amount of perisplenic hematoma and intraperitoneal blood which is new since the previous study. 
2.  New small bilateral pleural effusions and bibasilar atelectasis.
3.  No definite hepatic laceration visualized on today?s study.
PELVIS CT WITH CONTRAST:
IMPRESSION: 1.  New moderate to large amount of intraperitoneal blood within the pelvis. 
2.  This report was called to the patient?s floor nurse at the time of this dictation at [FE] hours on [DATE].  It was also discussed with Dr. LAAOUINA.

## 2005-02-11 IMAGING — CR DG CHEST 1V PORT
1 series · 1 of 1 positions shown · non-contrast
Comparison: [DATE].

CLINICAL DATA: Ankle dislocation.
 PORTABLE CHEST - 1 VIEW:

[view not recorded]
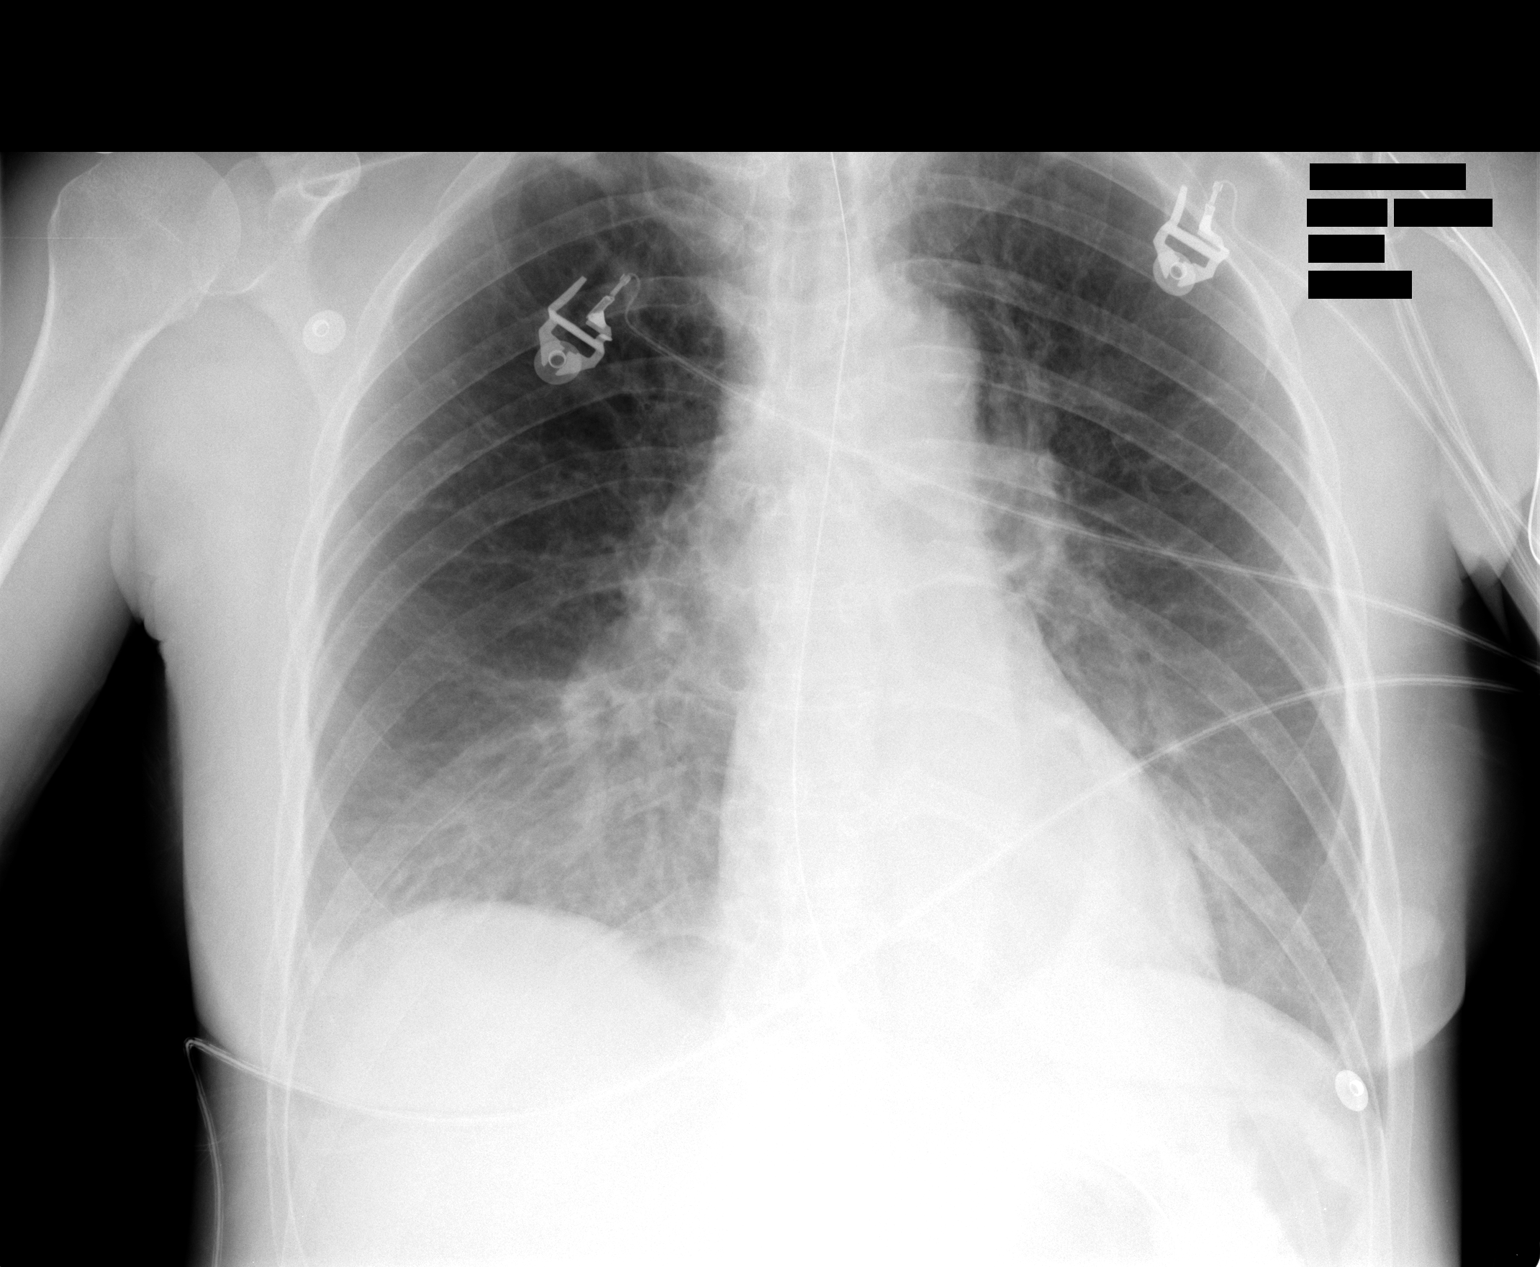

[1 of 1 positions shown; findings below may reference images not displayed]

Small bilateral pleural effusions and basilar atelectasis are unchanged.  Cardiomediastinal silhouette is stable.  Mild vascular congestion noted.  Nasogastric tube entering the stomach is identified.
IMPRESSION: Stable chest.

## 2005-02-12 IMAGING — CR DG FOOT COMPLETE 3+V*R*
3 series · 3 of 3 positions shown · non-contrast
Comparison: [DATE].

CLINICAL DATA: Right ankle open dislocation, post-cast change.    
 RIGHT FOOT - THREE VIEW:

[view not recorded (1 of 3)]
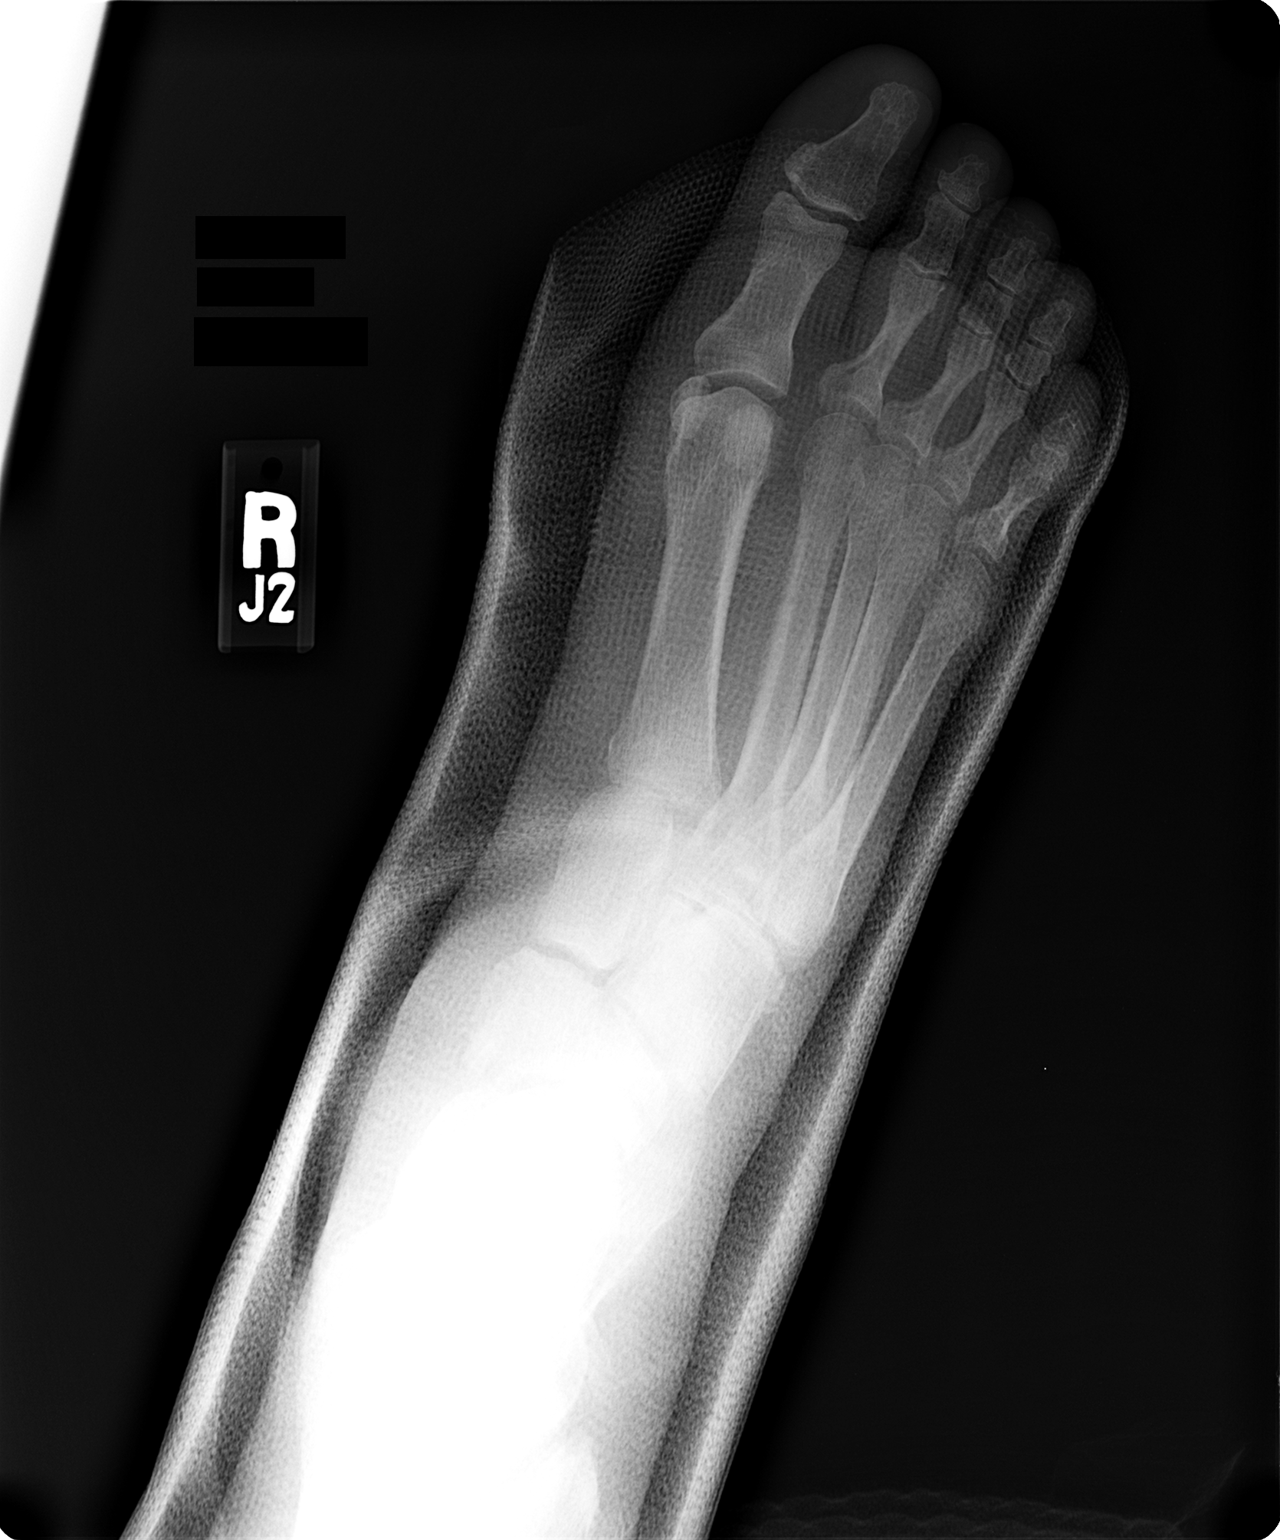

[view not recorded (2 of 3)]
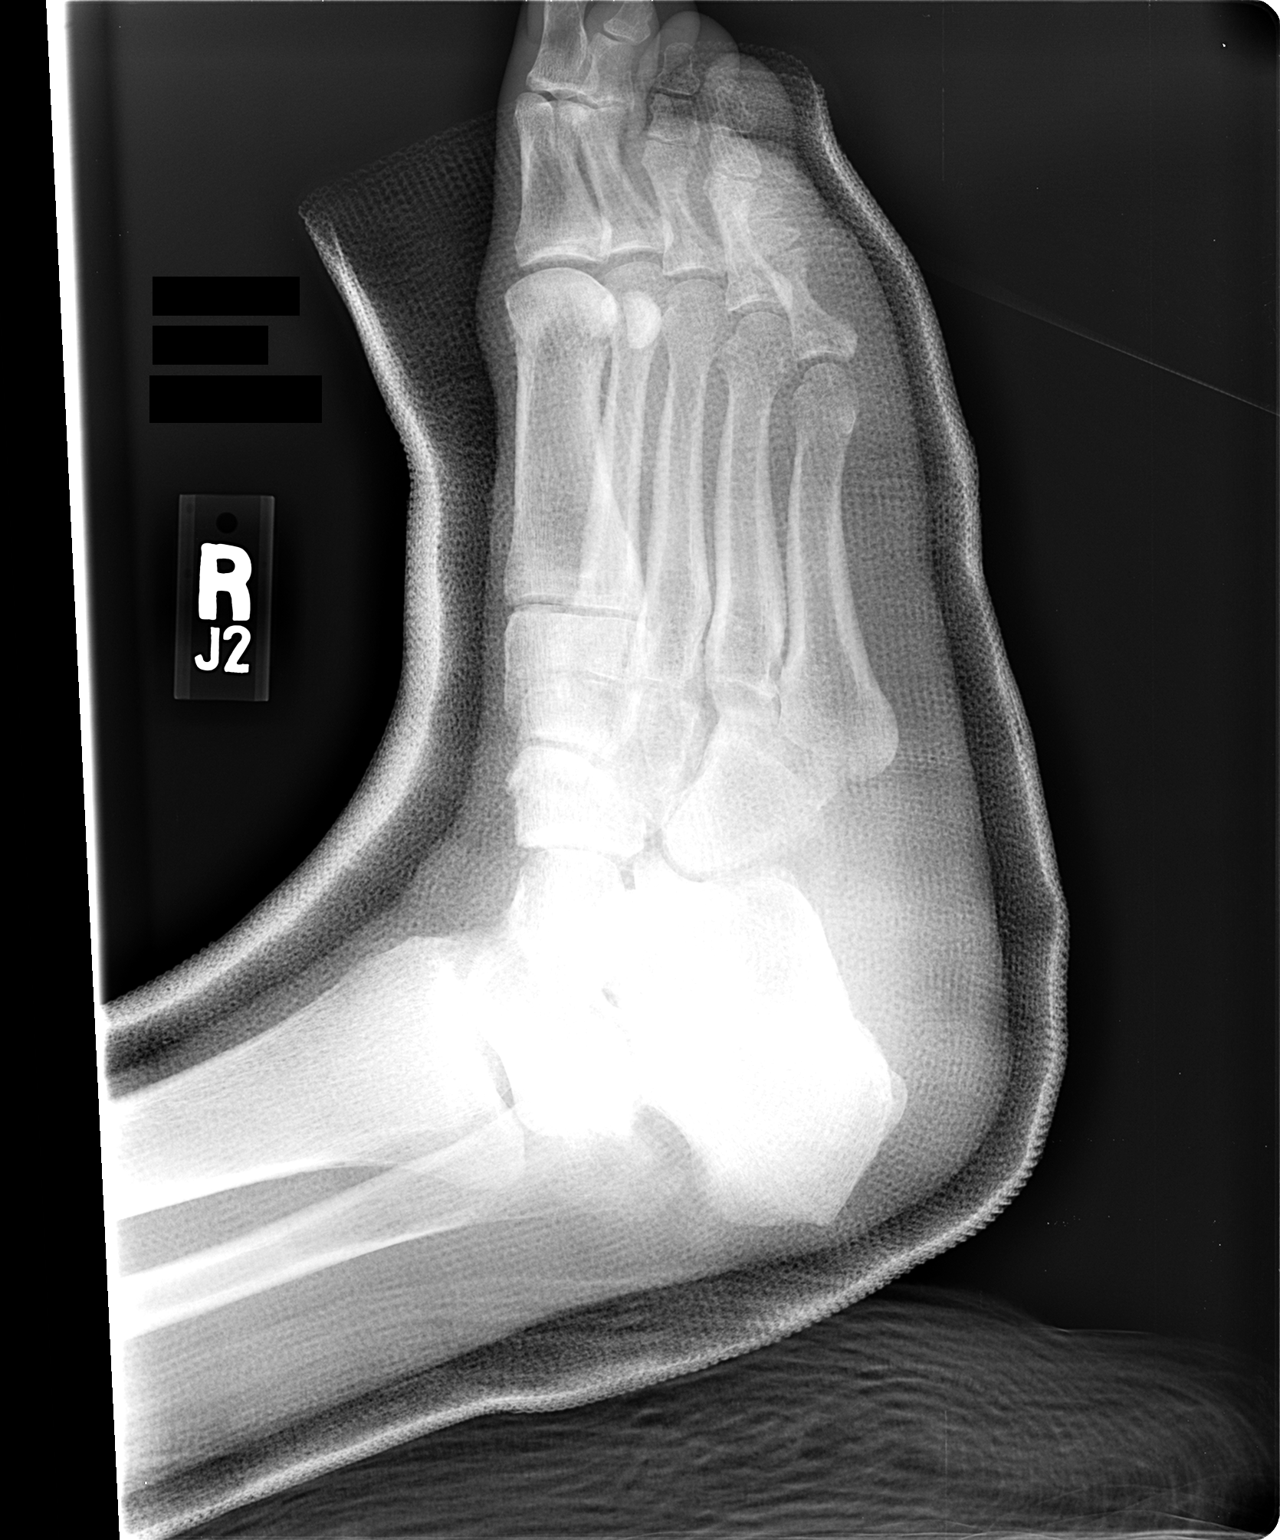

[view not recorded (3 of 3)]
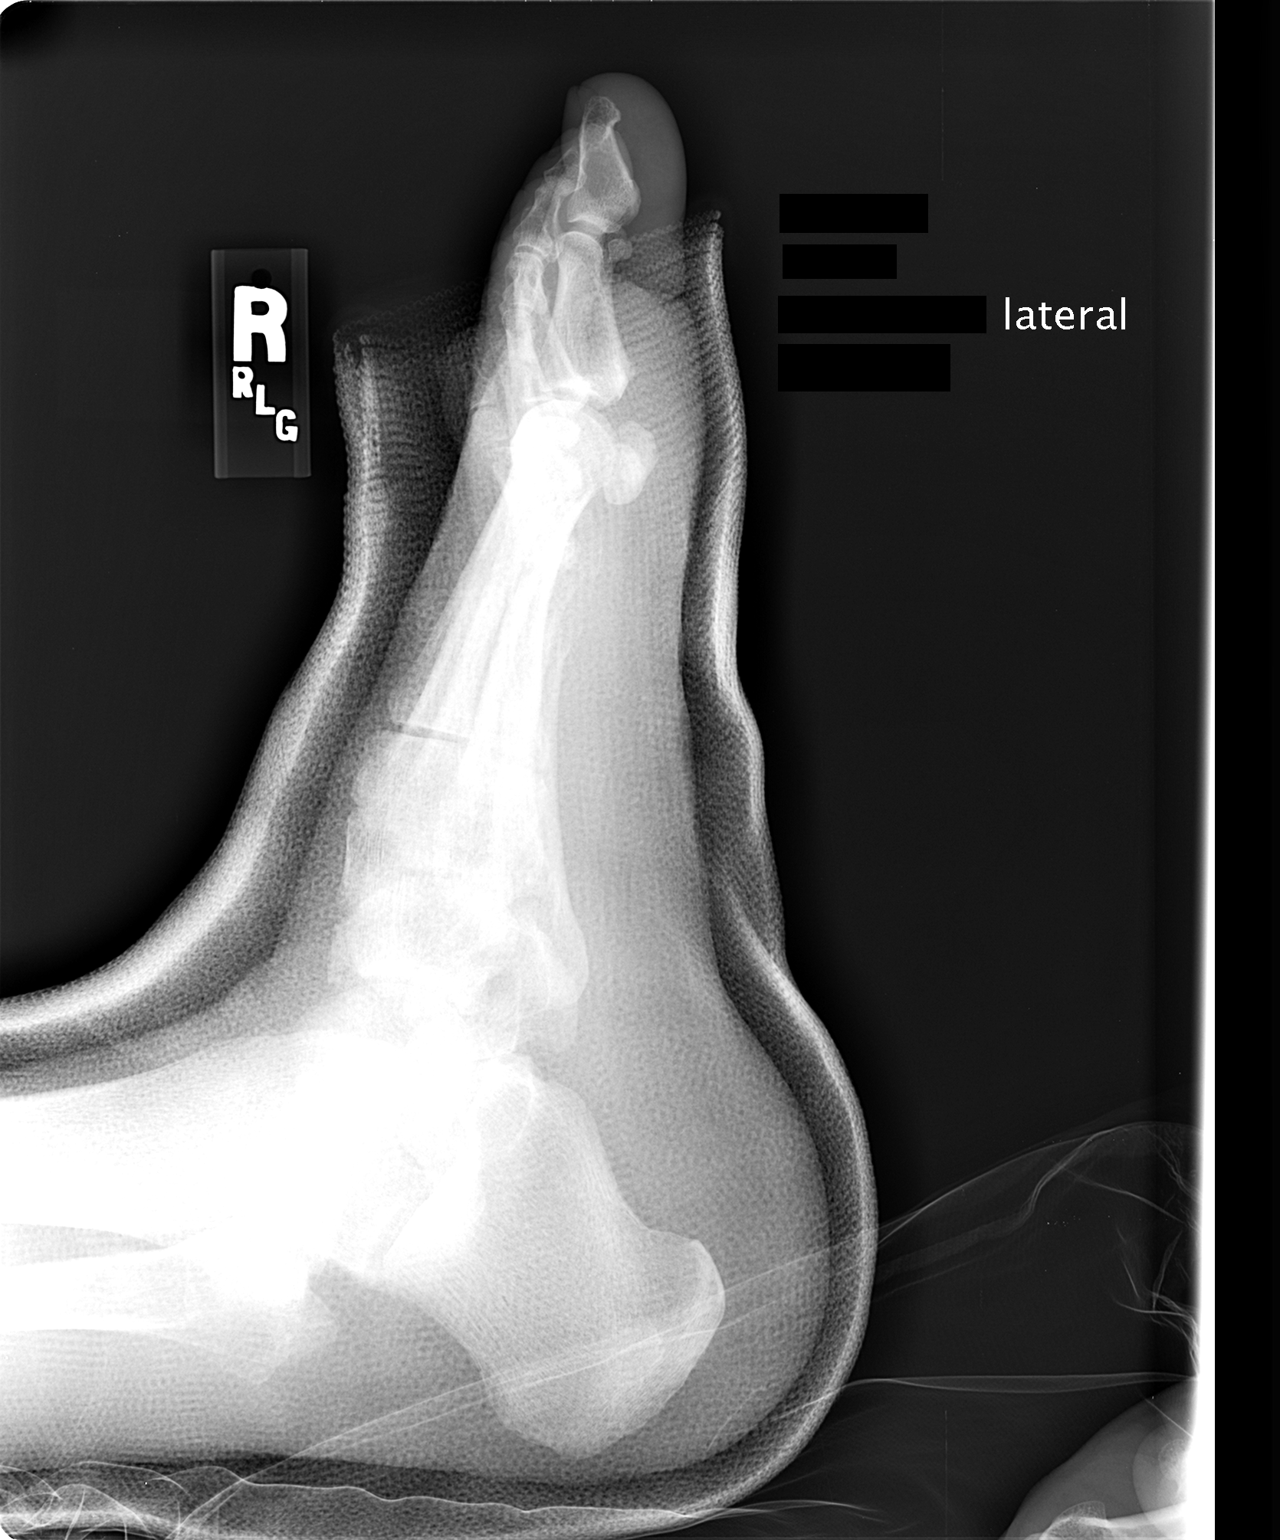

[3 of 3 positions shown; findings below may reference images not displayed]

FINDINGS: Overlying casting material obscures fine bone detail. 
 Distal right fibular fracture is again noted.  
 No additional fractures or dislocations identified.
IMPRESSION: Interval casting.  Fine bone detail is obscured. Distal right fibular fracture fragments in anatomic alignment.

## 2005-02-12 IMAGING — CR DG CHEST 1V PORT
1 series · 1 of 1 positions shown · non-contrast
Comparison: [DATE].

CLINICAL DATA: Right ankle dislocation.
 PORTABLE CHEST, ONE VIEW ? [DATE] ? ([4T] HOURS):

[view not recorded]
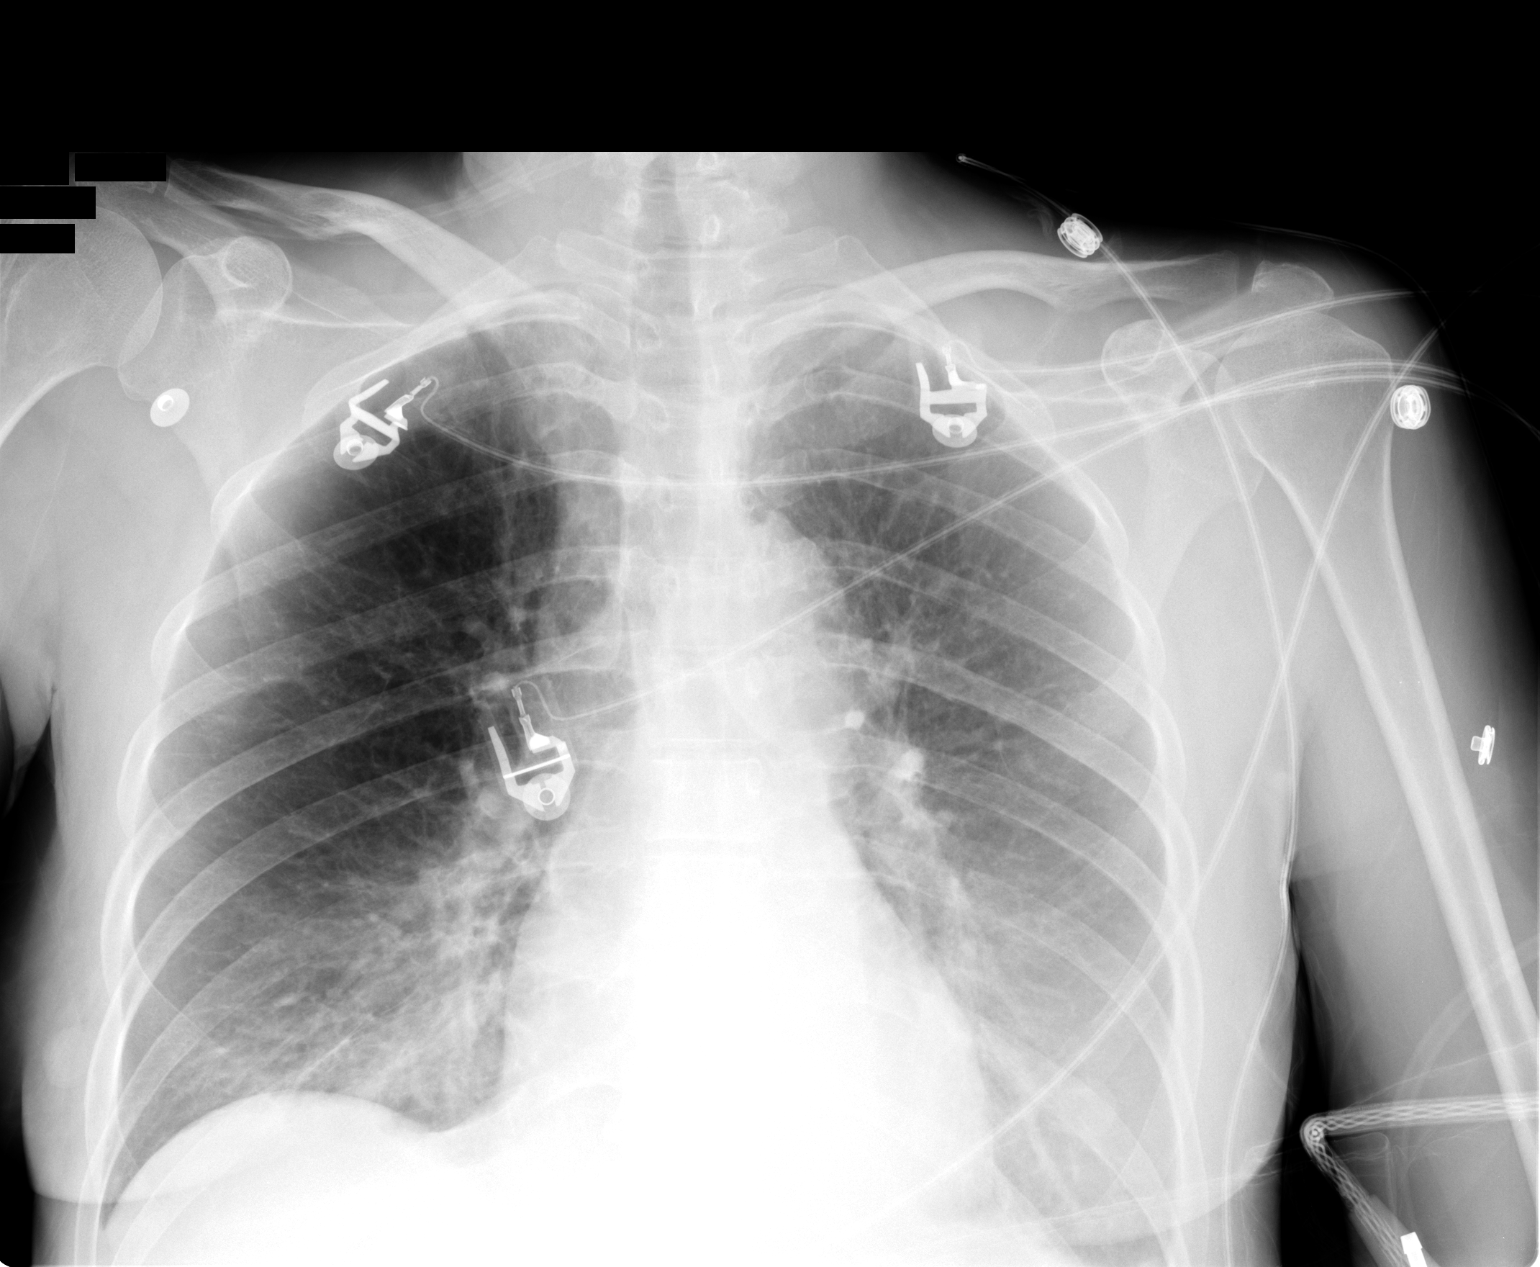

[1 of 1 positions shown; findings below may reference images not displayed]

FINDINGS: There is mild increase in left effusion.  There remains bibasilar atelectasis.  There is chronic lung disease.
IMPRESSION: Increase in left pleural effusion.  Little change in bibasilar atelectasis.

## 2005-02-12 IMAGING — CR DG ANKLE PORT 2V*R*
3 series · 3 of 3 positions shown · non-contrast
Comparison: [DATE].

CLINICAL DATA: Right ankle fracture.  
 RIGHT ANKLE - THREE VIEW:

[view not recorded (1 of 3)]
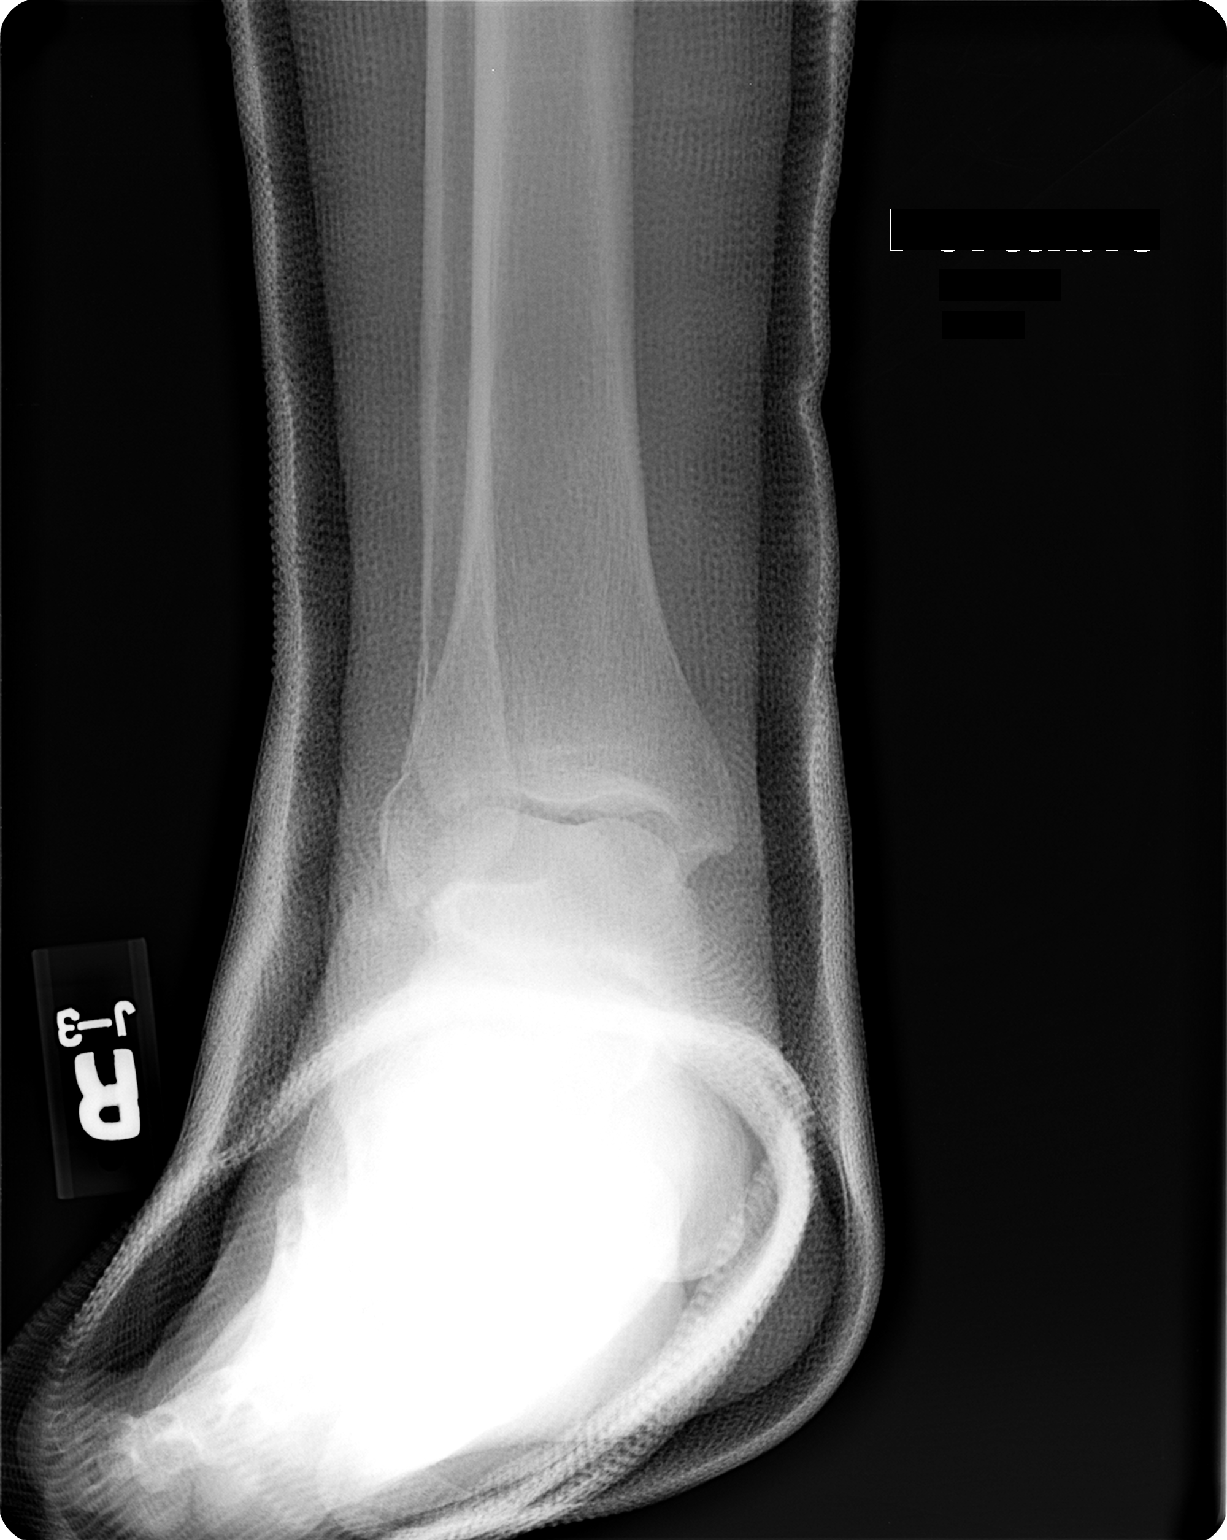

[view not recorded (2 of 3)]
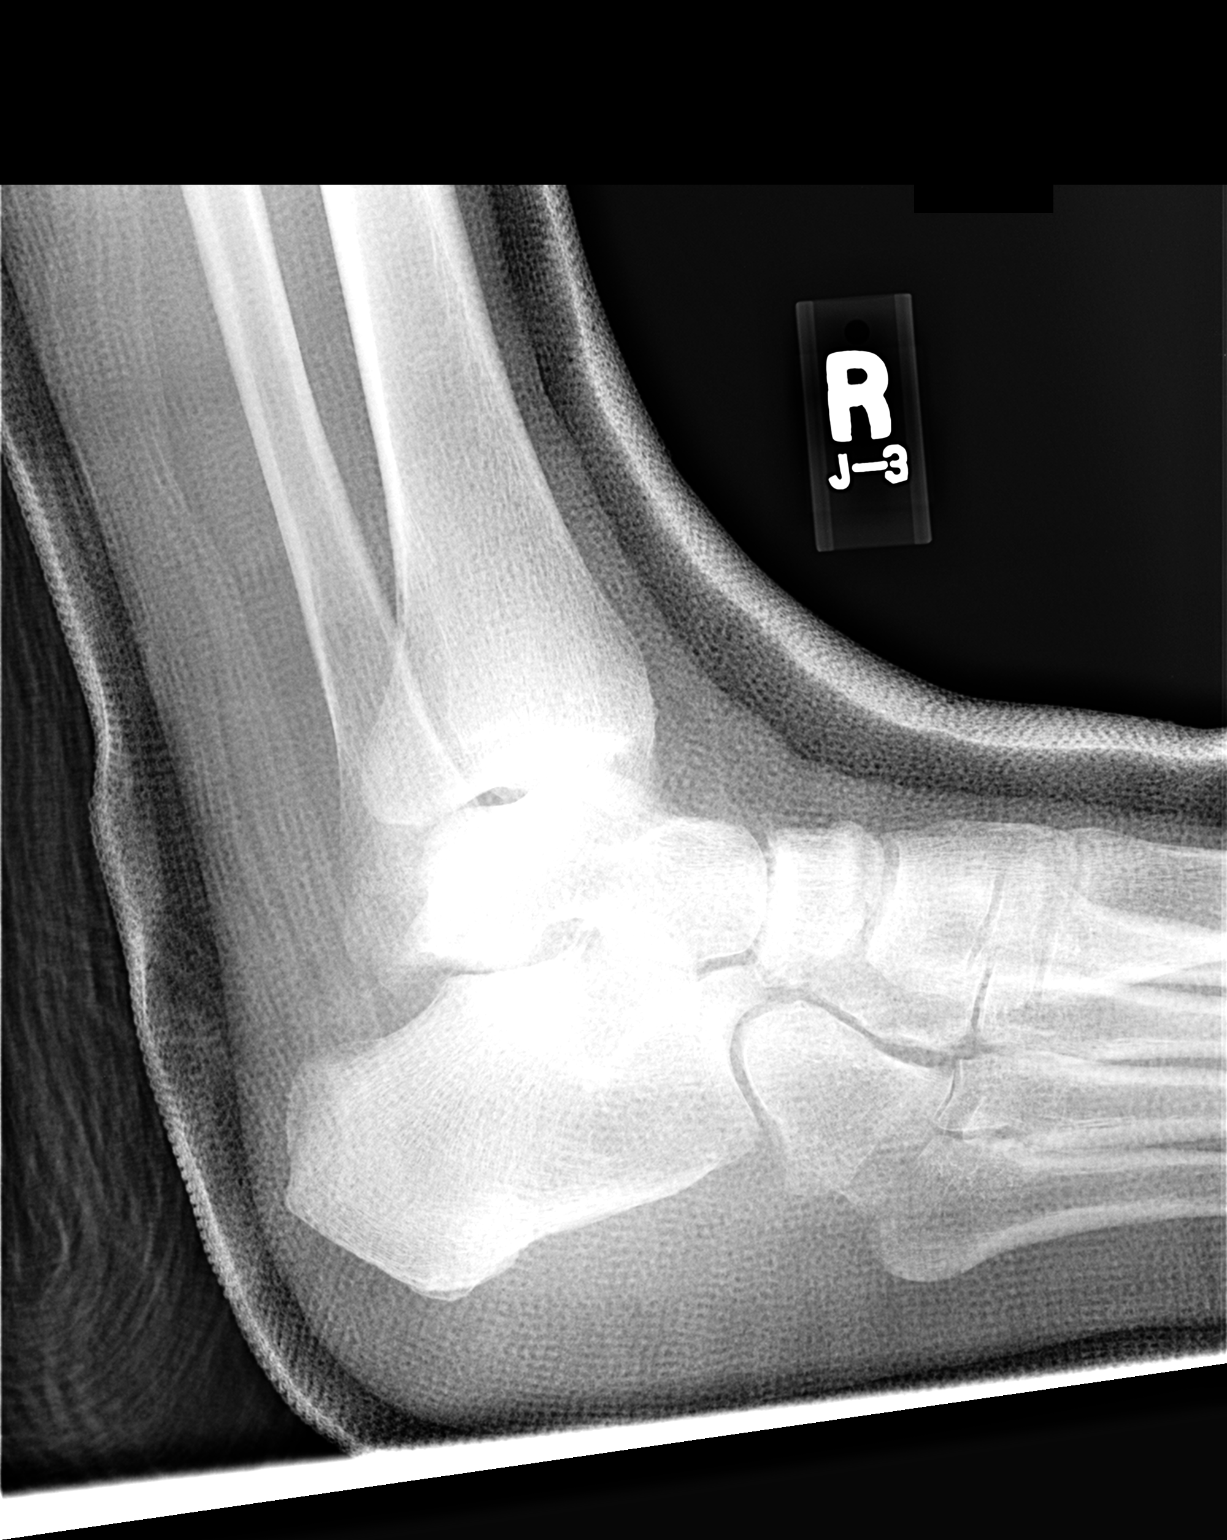

[view not recorded (3 of 3)]
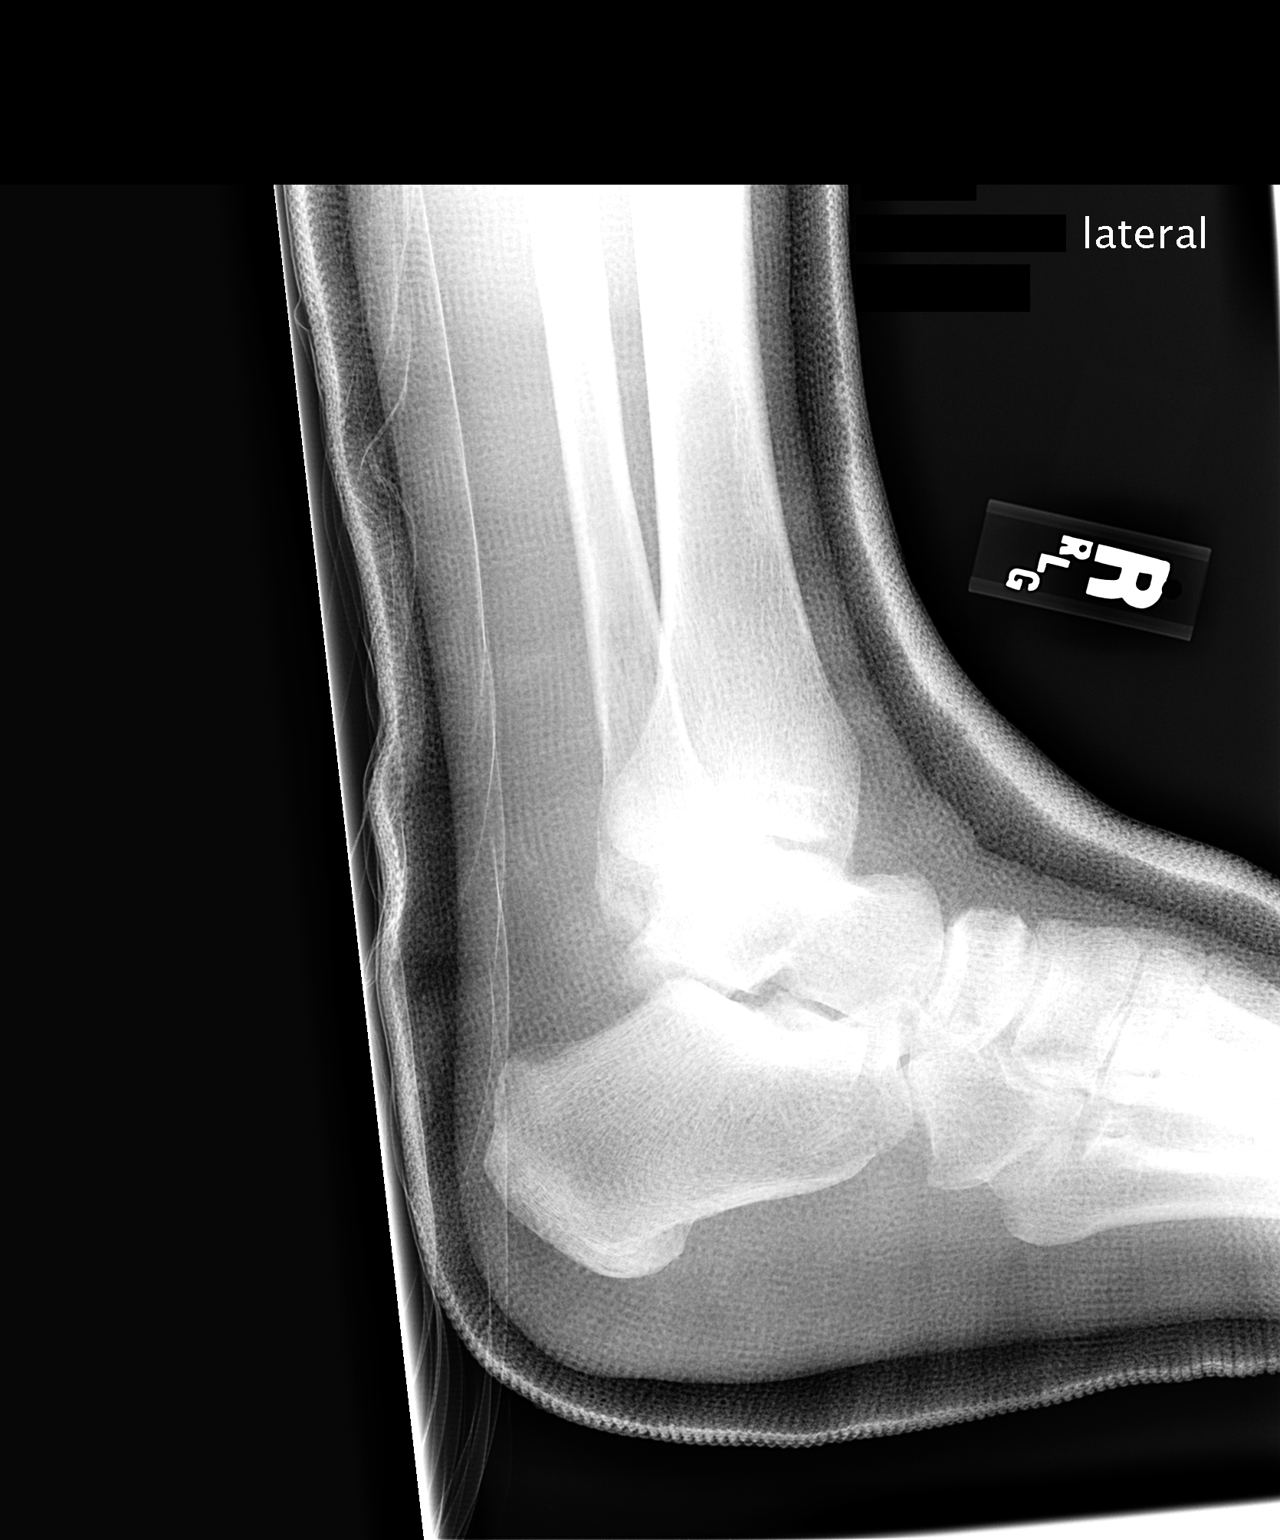

[3 of 3 positions shown; findings below may reference images not displayed]

Overlying casting material obscures fine bone detail. 
 Again identified is a transverse fracture of the distal right fibula.  The fracture fragments appear to be in near anatomic alignment.
IMPRESSION: Status post casting of right distal fibular fracture fragment in near anatomic alignment.

## 2005-02-13 IMAGING — CR DG CHEST 1V PORT
1 series · 1 of 1 positions shown · non-contrast
Comparison: Portable chest x-ray yesterday.

CLINICAL DATA: Fever, cough. Right ankle fractures.

PORTABLE CHEST - 1 VIEW  [DATE]/[PHONE_NUMBER] hours:

[view not recorded]
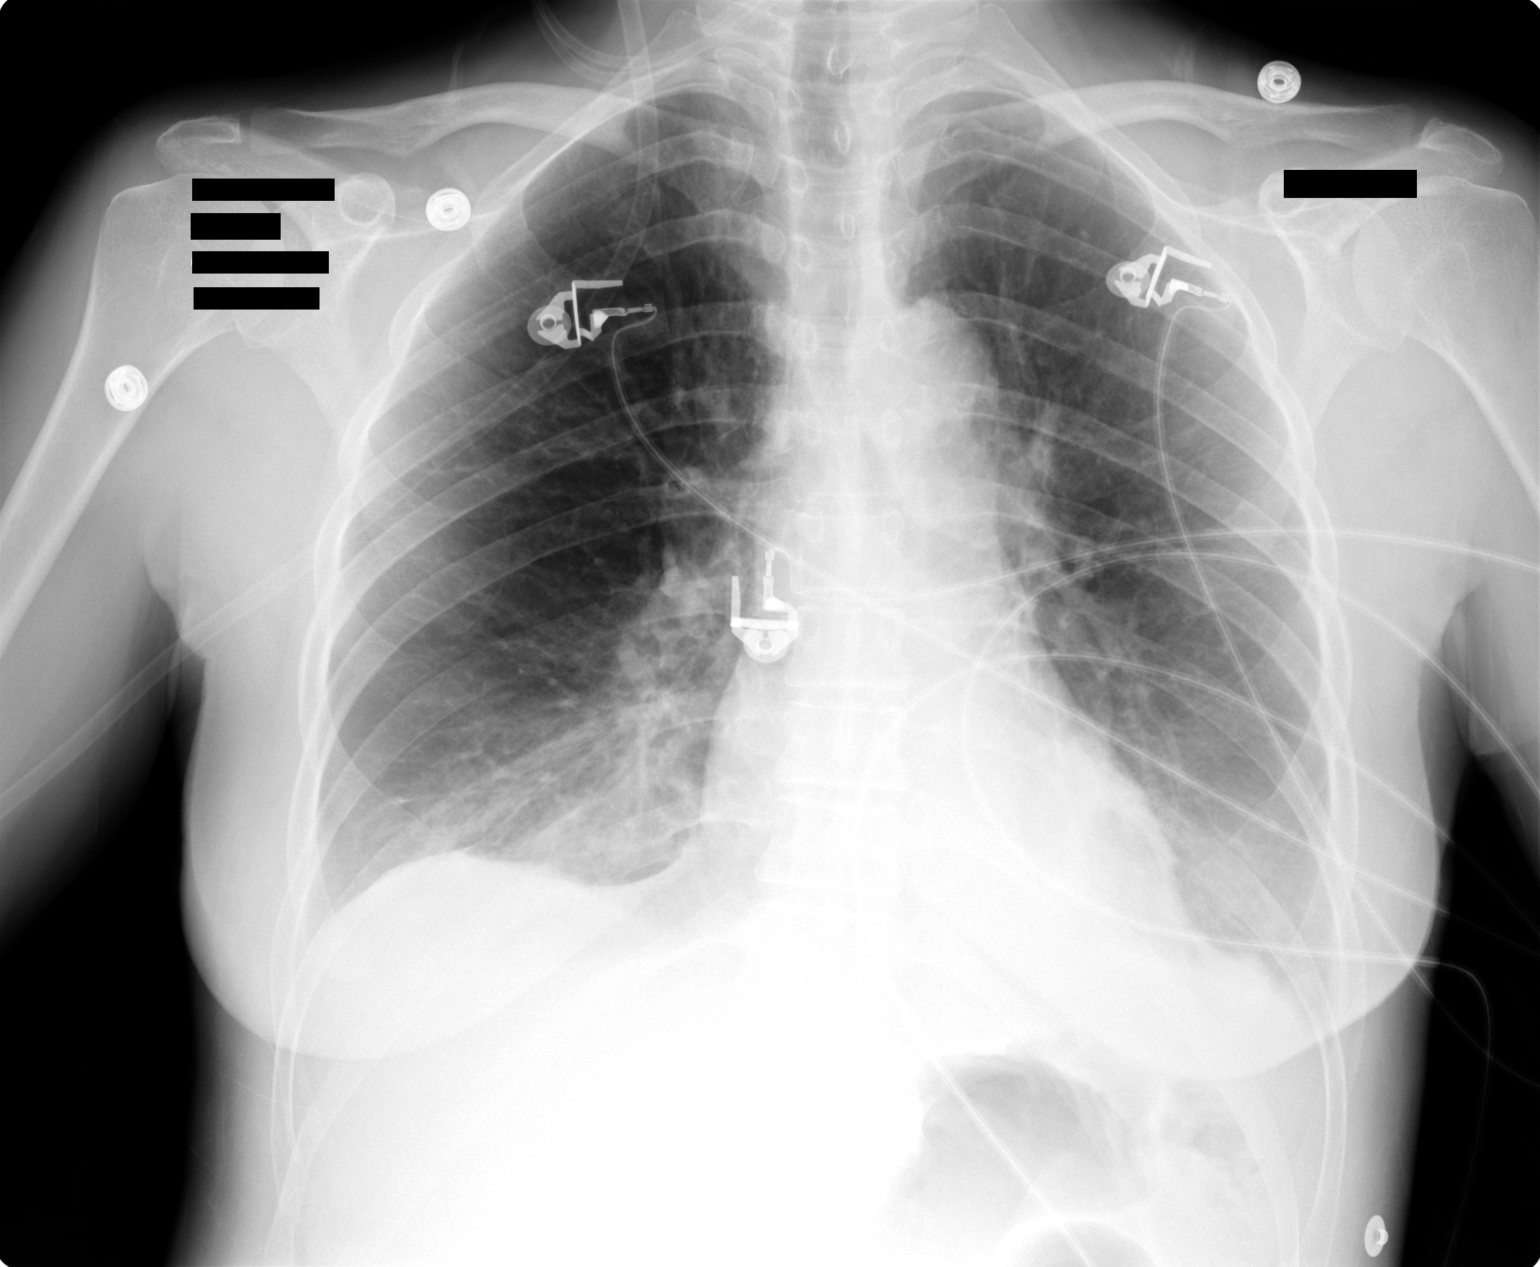

[1 of 1 positions shown; findings below may reference images not displayed]

FINDINGS: The small bilateral pleural effusions, left greater than right, are
not significantly changed. The airspace opacities in the lung bases are
similarly stable. No new abnormalities are detected. The heart size is normal
and stable.
IMPRESSION: Stable small bilateral effusions and associated atelectasis/pneumonia in the
lower lobes. No new abnormalities.

## 2005-02-14 IMAGING — CR DG CHEST 1V PORT
1 series · 1 of 1 positions shown · non-contrast
Comparison: [DATE].

CLINICAL DATA: Right ankle fracture dislocation.
 PORTABLE [78] VIEW:

[view not recorded]
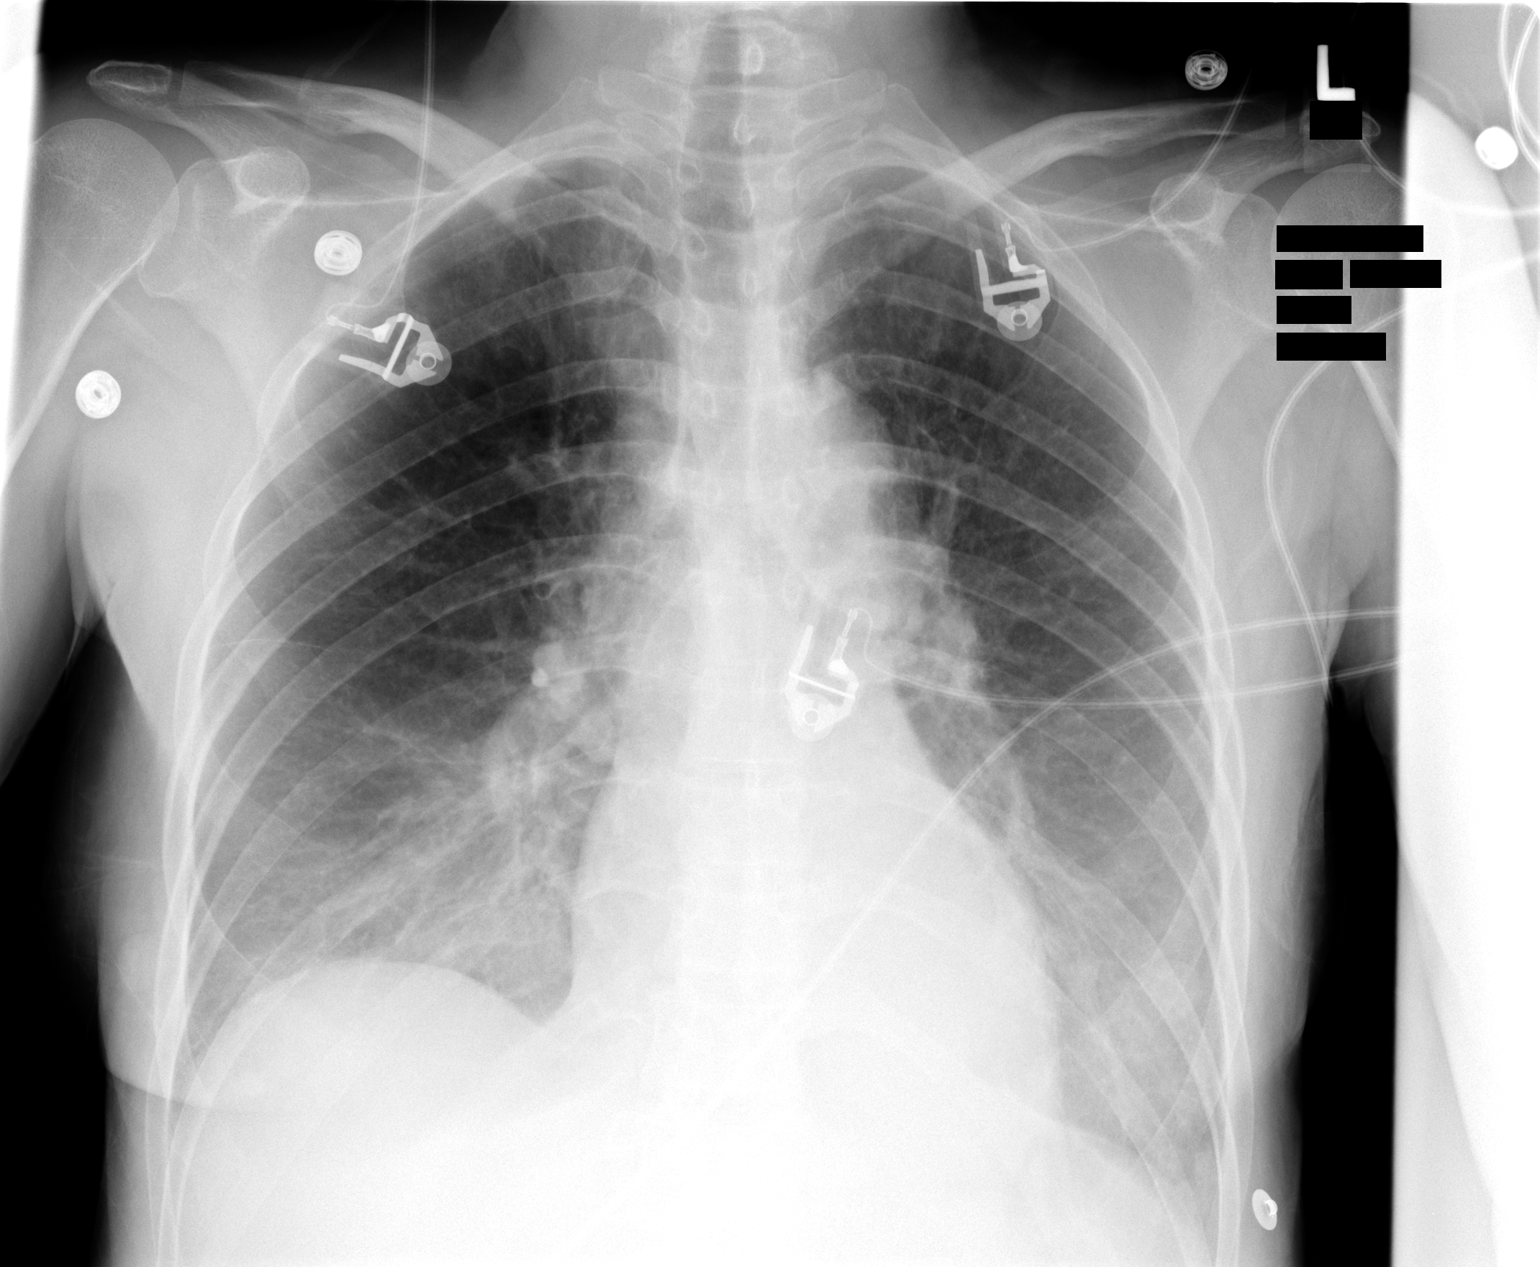

[1 of 1 positions shown; findings below may reference images not displayed]

Bilateral lower lung atelectasis/airspace disease and small bilateral pleural effusions noted. Slightly improved lung volumes since the last study.  No other significant changes.
IMPRESSION: Slight improved lung volumes. Otherwise stable bilateral lower lung atelectasis/airspace disease and small effusion.

## 2005-02-19 ENCOUNTER — Inpatient Hospital Stay: Admission: RE | Admit: 2005-02-19 | Discharge: 2005-02-23 | Payer: Self-pay | Admitting: *Deleted

## 2005-02-20 ENCOUNTER — Ambulatory Visit (HOSPITAL_COMMUNITY): Admission: RE | Admit: 2005-02-20 | Discharge: 2005-02-20 | Payer: Self-pay | Admitting: *Deleted

## 2006-02-10 ENCOUNTER — Ambulatory Visit: Payer: Self-pay | Admitting: Internal Medicine

## 2006-02-10 IMAGING — CT CT CHEST W/ CM
1 series · 15 of 33 positions shown, 19 images · non-contrast
Comparison: none

REASON FOR EXAM: Dyspnea, follow-up pleural nodule
COMMENTS:

[Series 2: soft tissue · axial · 0.68mm/px · z∈[-572,-258]mm · 15 of 75 slices shown, 19 images]
[im 6/75  mediastinal]
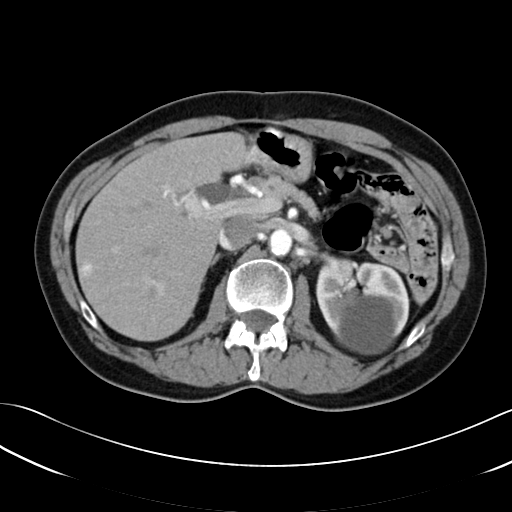
[im 6/75  lung]
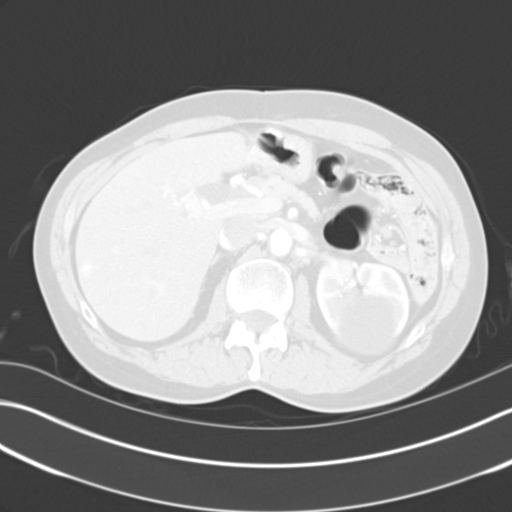
[im 11/75  lung]
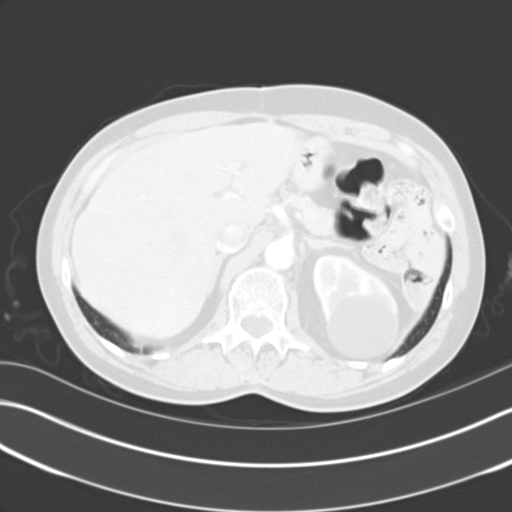
[im 15/75  lung]
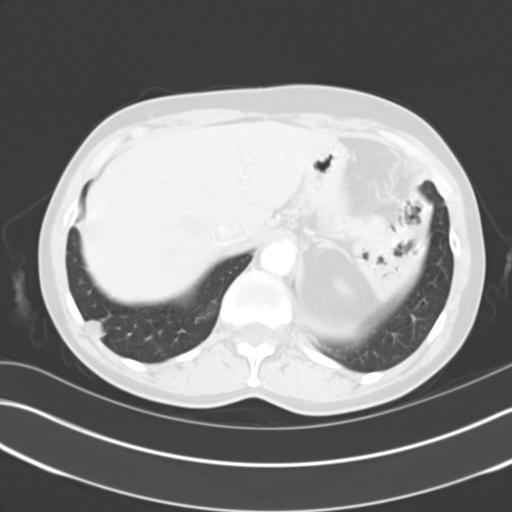
[im 20/75  lung]
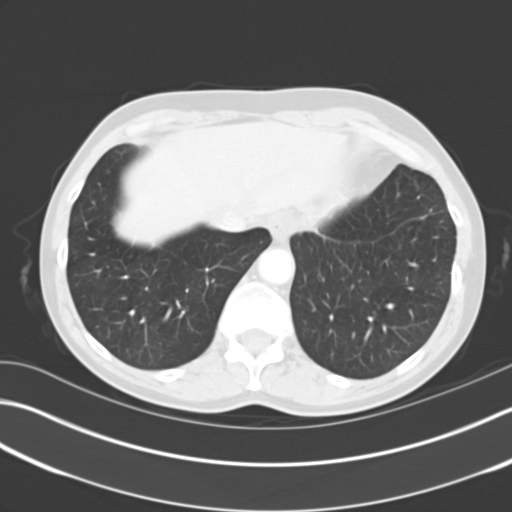
[im 25/75  mediastinal]
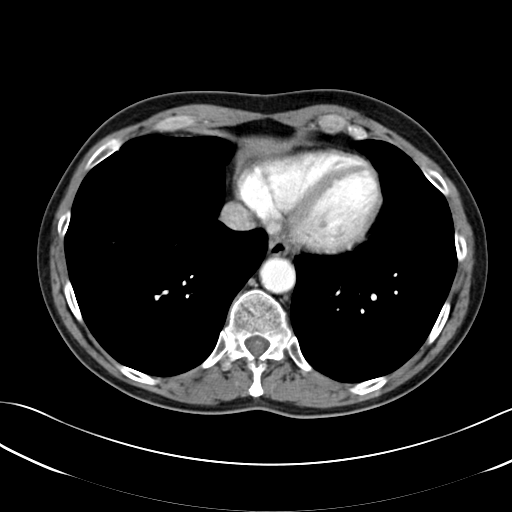
[im 25/75  lung]
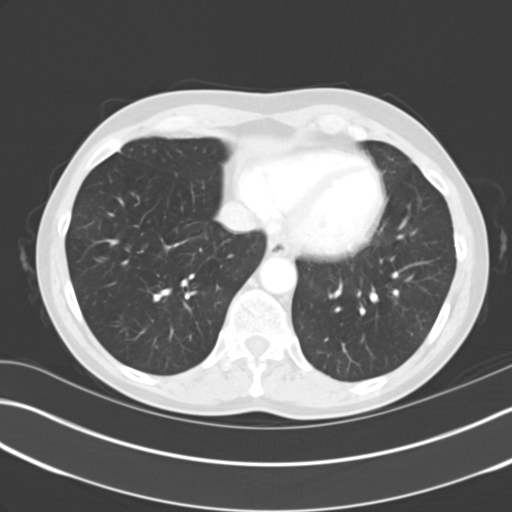
[im 30/75  lung]
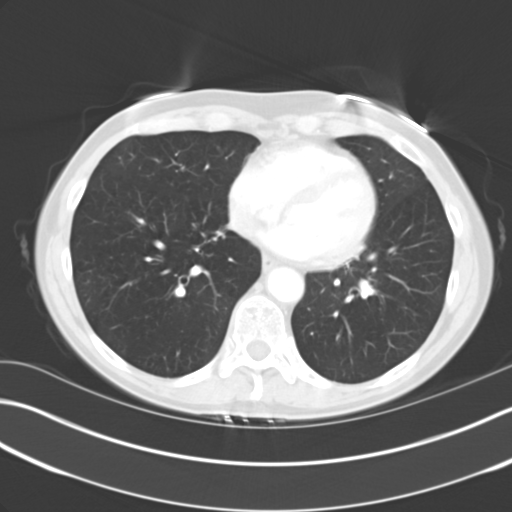
[im 33/75  lung]
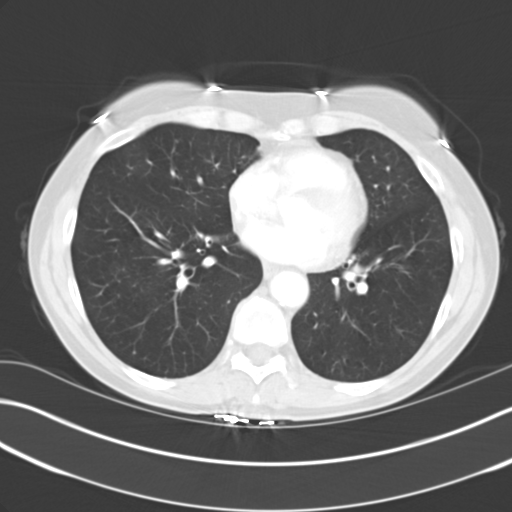
[im 39/75  lung]
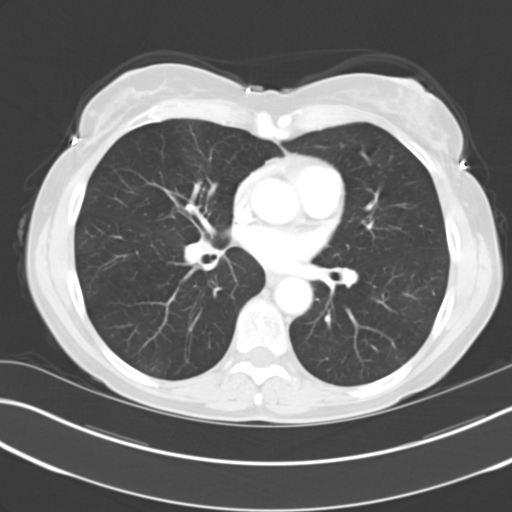
[im 42/75  mediastinal]
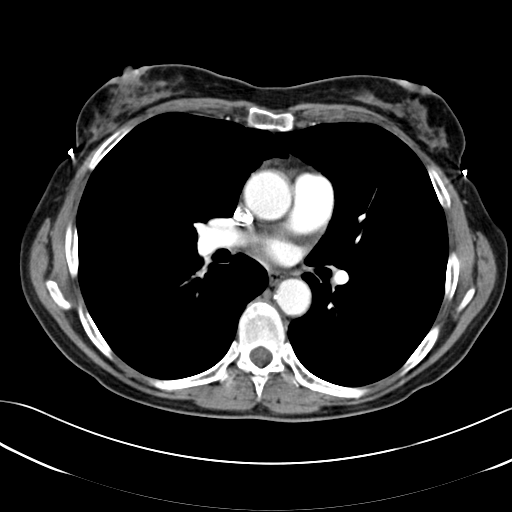
[im 42/75  lung]
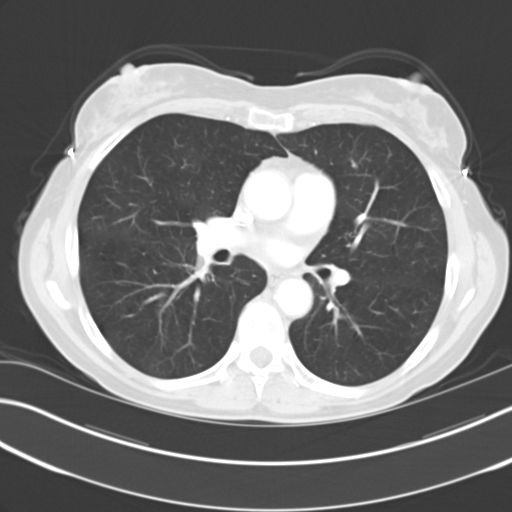
[im 45/75  lung]
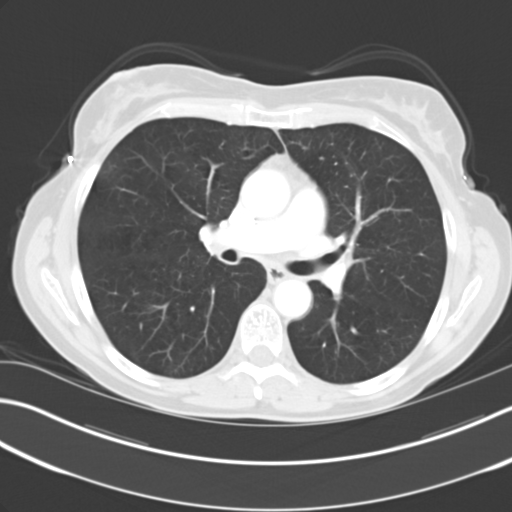
[im 50/75  lung]
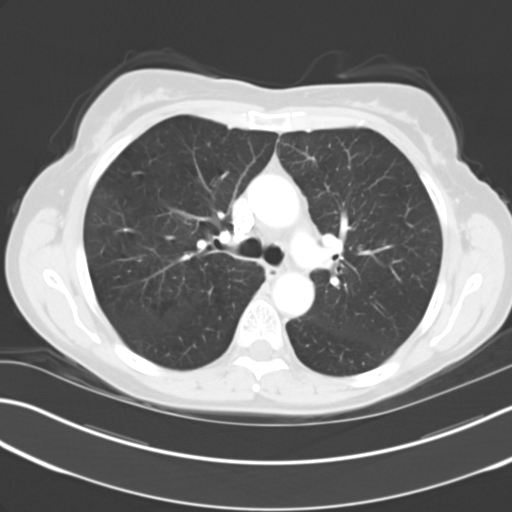
[im 55/75  lung]
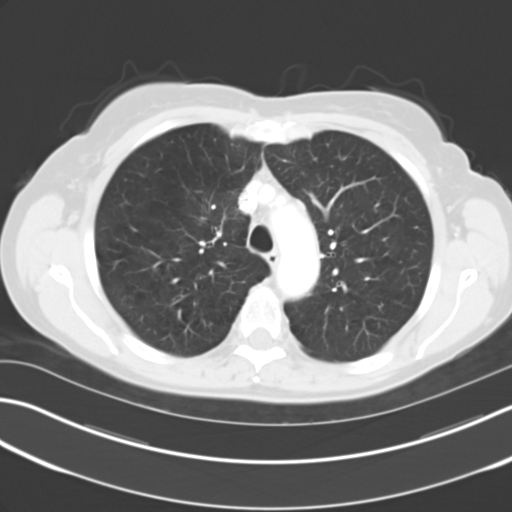
[im 60/75  mediastinal]
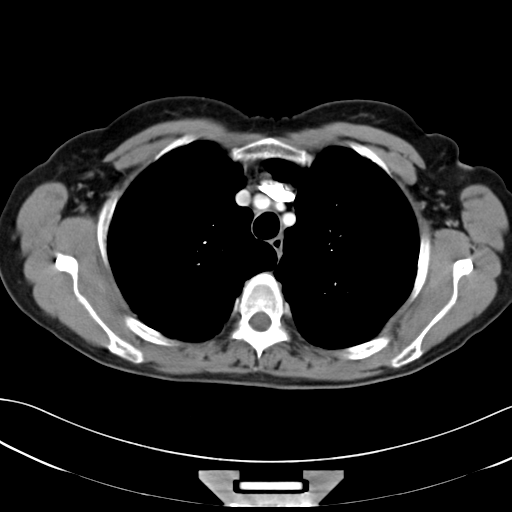
[im 60/75  lung]
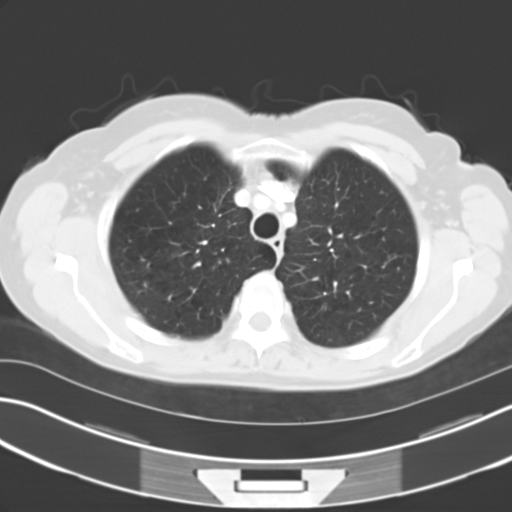
[im 64/75  lung]
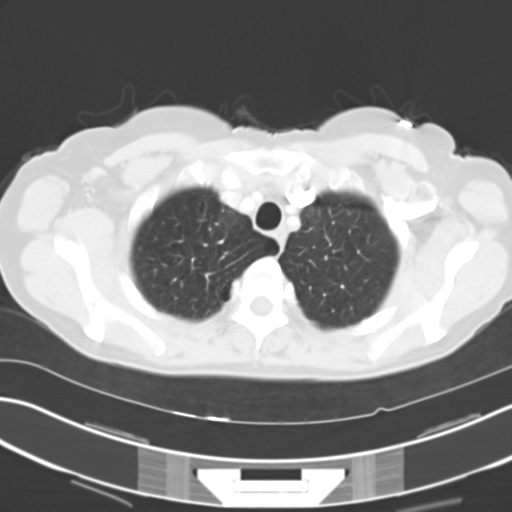
[im 69/75  lung]
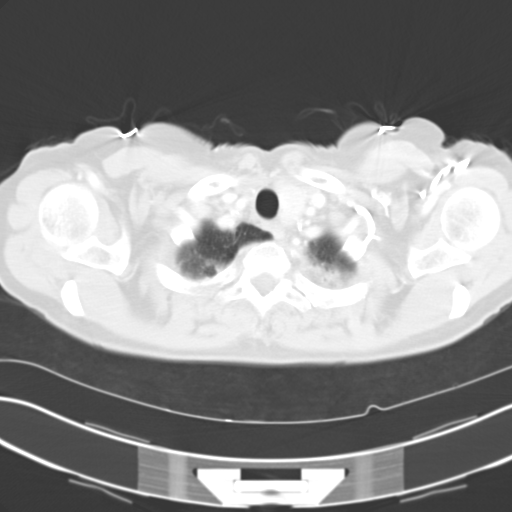

[15 of 33 positions shown; findings below may reference images not displayed]

PROCEDURE:     CT  - CT CHEST WITH CONTRAST  - [DATE]  [DATE]

RESULT:     The patient is being evaluated for dyspnea. The patient
underwent chest CT scanning on [DATE] at which time nodularity was noted
at the RIGHT lung base.

The patient received 75 cc's of [8N] for this study.

There is density at the RIGHT lung base that is stable. It lies
posterolaterally as well as some medially just above the costophrenic
gutter. I do not see other areas of abnormal density nor nodularity in the
RIGHT lung. There is a small amount of apical pleural scarring bilaterally
which is stable. There is a small amount of bullous disease in the apices
that is stable.

Within the mediastinum, the caliber of the thoracic aorta is normal. The
cardiac chambers are not enlarged. I see no pathologic sized mediastinal or
hilar lymph nodes. There are no central pulmonary emboli identified.

Within the upper abdomen, the gallbladder is surgically absent. There is a
dominant hypodensity in the mid to upper pole of the LEFT kidney consistent
with a cyst. The Hounsfield measurement is 9. It measures approximately
cm in diameter.
IMPRESSION: 1.     I do not see evidence of new lesions within the RIGHT or LEFT lung.
2.     There is no mediastinal or hilar lymphadenopathy.
3.     The caliber of the thoracic aorta is normal and the pulmonary
vascularity is within the limits of normal centrally.
4.     There is a stable hypodensity in the upper pole of the LEFT kidney
compatible with a cyst.
5.     There is low density within the RIGHT thyroid lobe which likely
reflects a nodule. This was not included in the field of view on the prior
study. Correlation clinically and with possible ultrasound is recommended.

## 2006-02-12 ENCOUNTER — Ambulatory Visit: Payer: Self-pay | Admitting: Internal Medicine

## 2006-02-12 IMAGING — US US THYROID
1 series · 17 of 25 positions shown · non-contrast
Comparison: none

REASON FOR EXAM: Thyroid nodules as seen on chest CT
COMMENTS:

[Series 1: us thyroid · 17 of 48 slices shown]
[im 1/48]
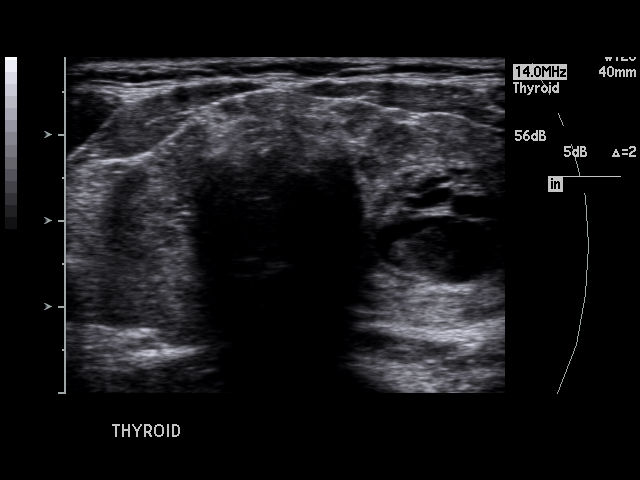
[im 4/48]
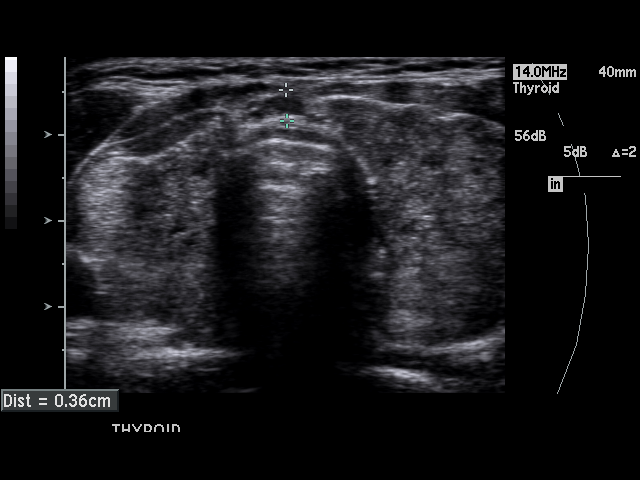
[im 6/48]
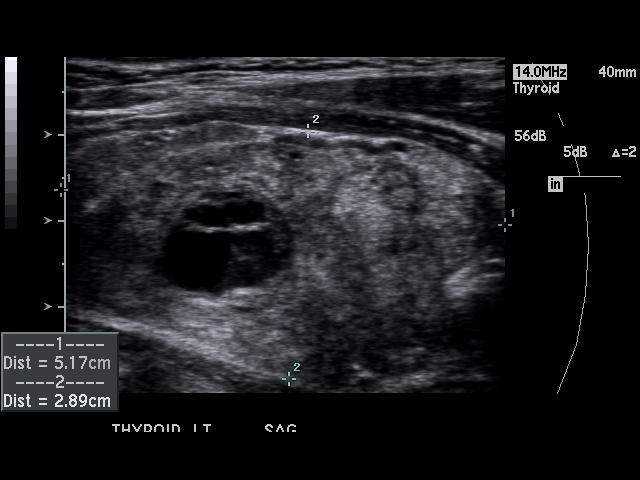
[im 10/48]
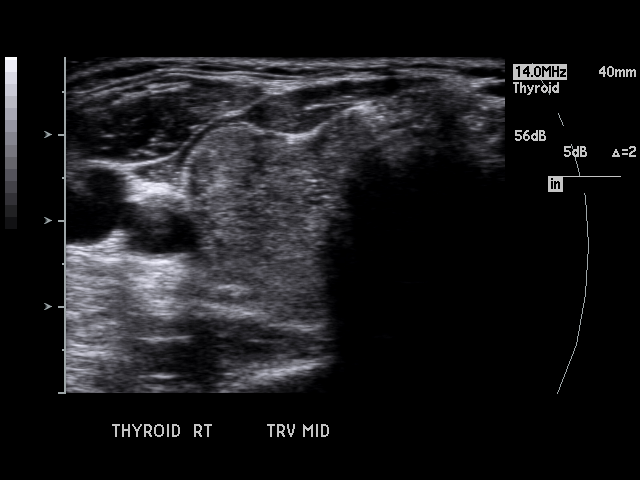
[im 12/48]
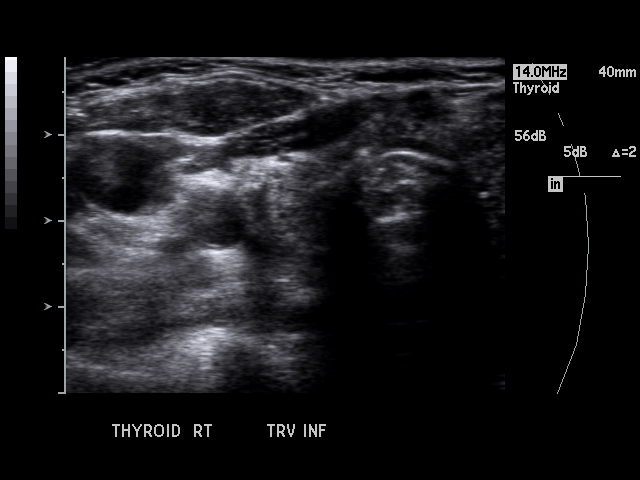
[im 16/48]
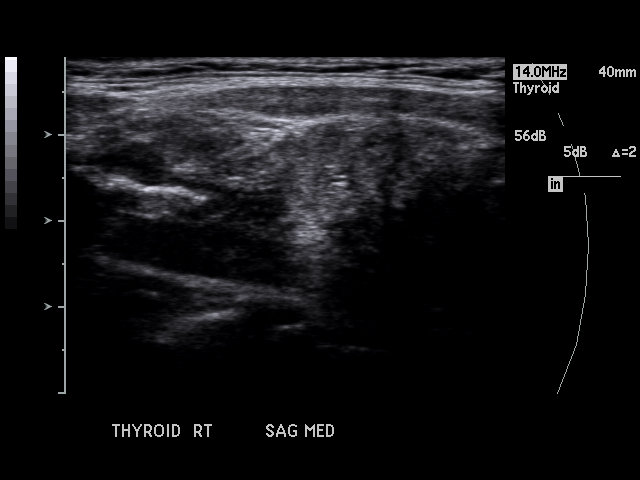
[im 18/48]
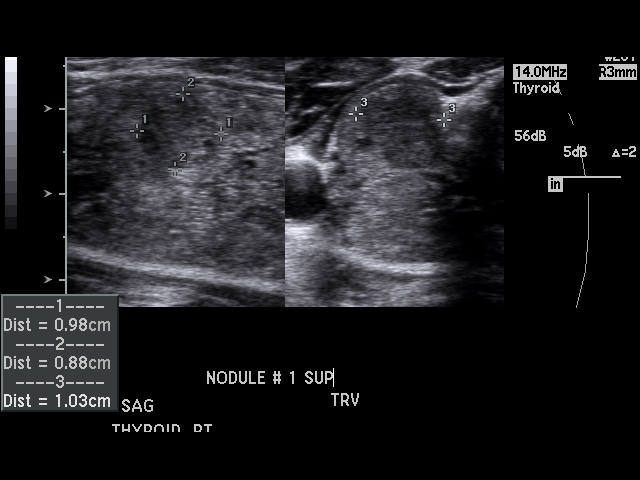
[im 22/48]
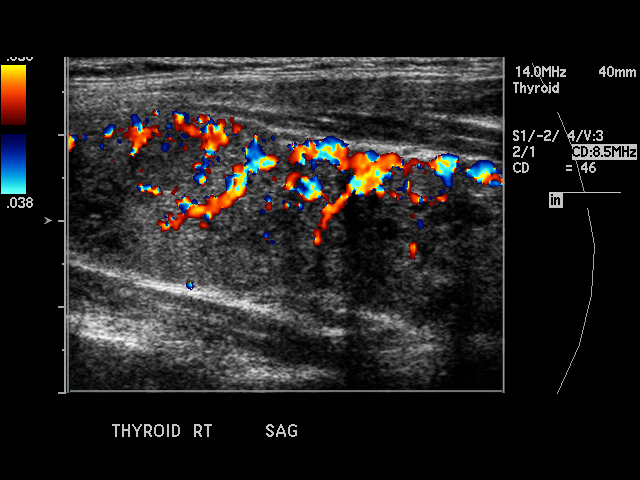
[im 24/48]
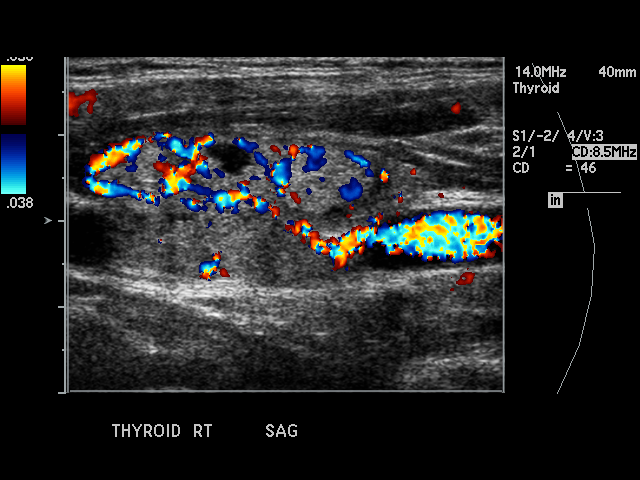
[im 26/48]
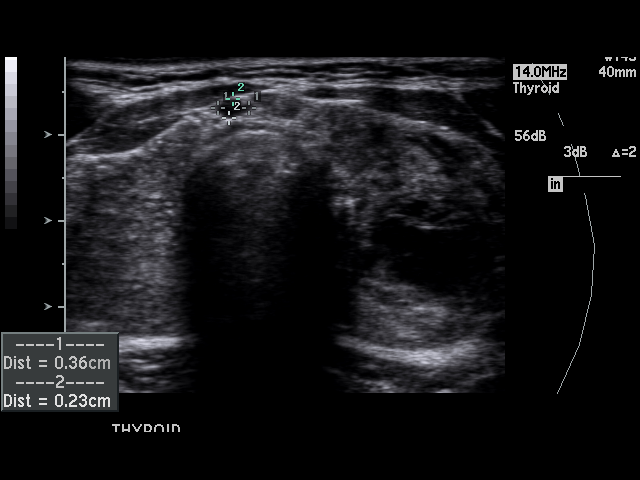
[im 30/48]
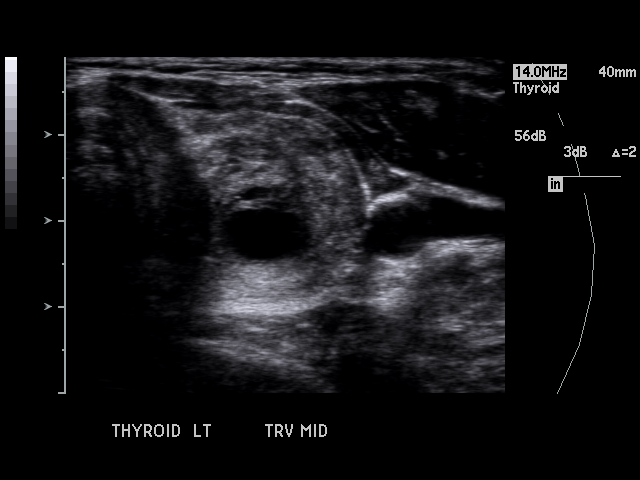
[im 32/48]
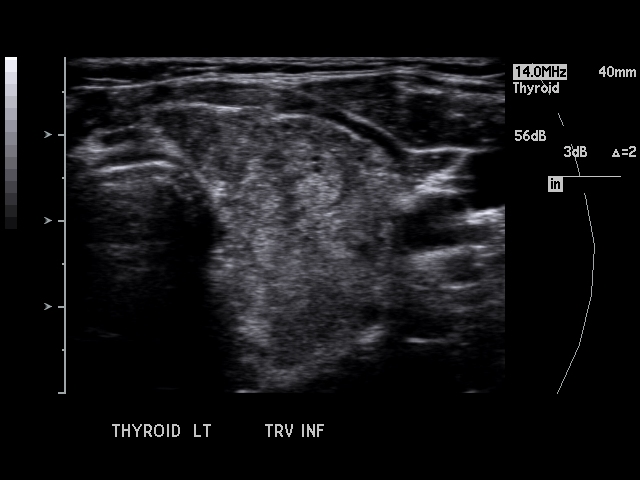
[im 36/48]
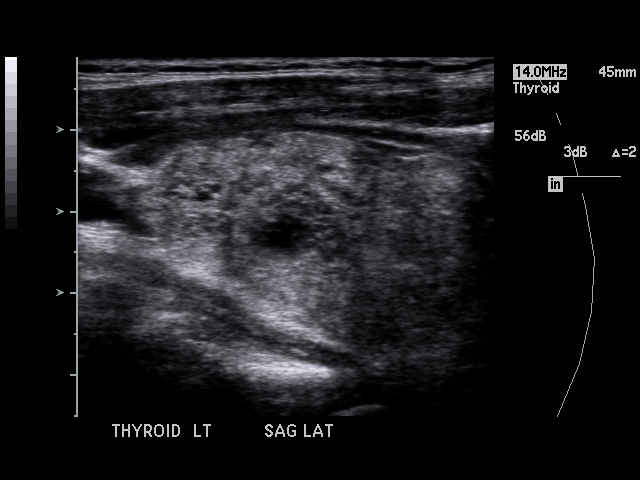
[im 38/48]
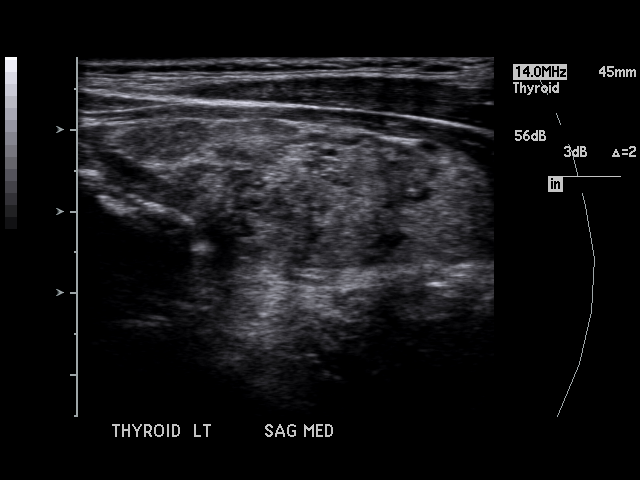
[im 42/48]
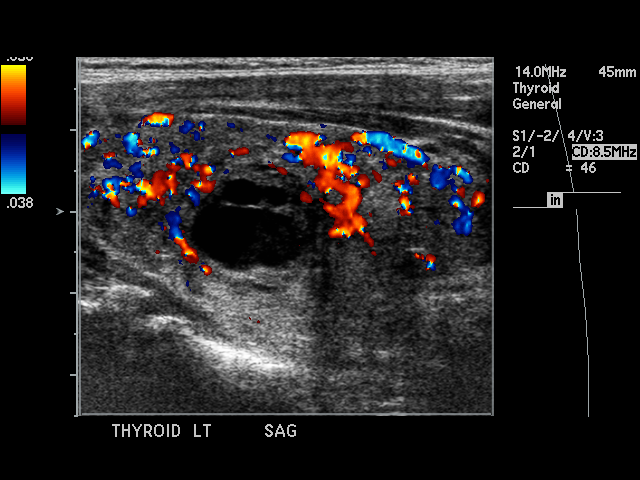
[im 44/48]
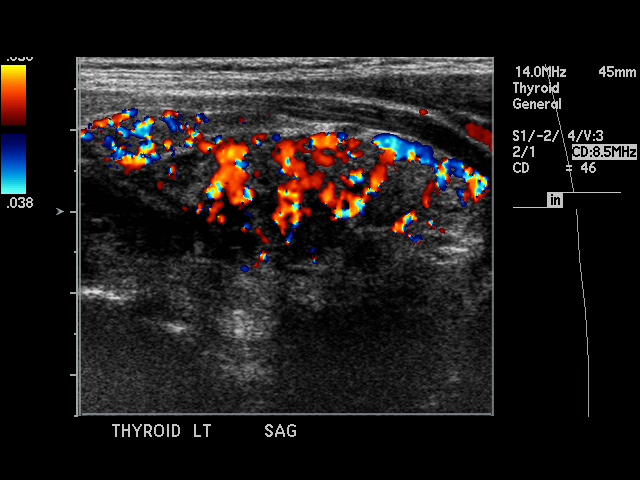
[im 48/48]
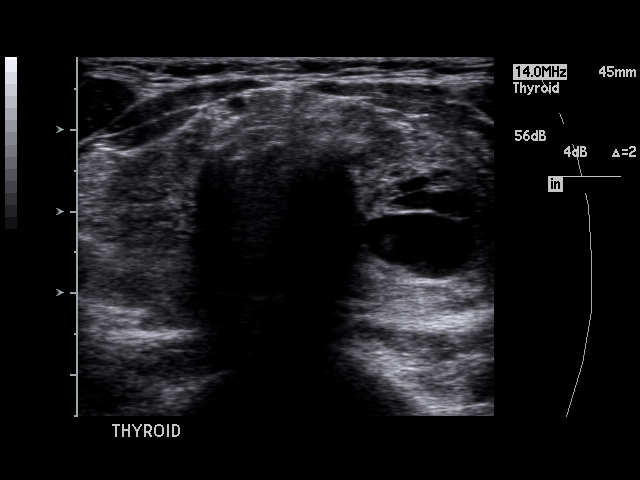

[17 of 25 positions shown; findings below may reference images not displayed]

PROCEDURE:     US  - US THYROID  - [DATE] [DATE]

RESULT:     The RIGHT lobe of the thyroid measures 4.55 x 1.91 x 1.93 cm.
Multiple nodules are demonstrated along the periphery of the RIGHT lobe.
Superiorly the nodule has a complex appearance and measures 0.98 x 0.88 x
1.03 cm. A more cystic appearing nodule is seen within the superior mid
portion measuring 0.53 x 0.4 x 0.4 cm. Along the mid inferior portion of the
RIGHT lobe a hyperechoic nodule is identified measuring 1.04 x 1.72 x 0.8 cm
and superiorly a cystic nodule is identified measuring 0.3 x 0.27 x 0.27 cm.
The isthmus measures 3.6 mm and a small cystic area is identified measuring
3.6 x 2.3 mm. Evaluation of the LEFT lobe of the thyroid demonstrates
dimensions of 5.17 x 2.89 x 2.23 cm. A nodule is demonstrated centrally
within the LEFT lobe which has a complex cystic and solid appearance
measuring 1.51 x 1.56 x 1.67 cm. The cystic areas likely represent the
sequelae of colloid. No further definite nodule is demonstrated within the
LEFT lobe though the remaining portions of the LEFT lobe demonstrate a
heterogeneous echotexture.
IMPRESSION: Multiple nodules within the thyroid as described above with
diffuse enlargement. These findings suggest the sequelae of a multinodular
goiter.

## 2006-04-30 ENCOUNTER — Ambulatory Visit: Payer: Self-pay | Admitting: Internal Medicine

## 2006-04-30 IMAGING — MG UNKNOWN MG STUDY
1 series · 4 of 4 positions shown · non-contrast
Comparison: none

REASON FOR EXAM: Screening mammo
COMMENTS:

PROCEDURE:     MAM - MAM DGTL SCREENING MAMMO W/CAD  - [DATE]  [DATE]
RESULT:     The current exam is compared to the prior examinations of [DATE]
and [DATE]. No dominant mass or malignant-appearing microcalcifications are
seen.  The breast parenchyma is moderately dense.

[Series 7228: R CC · right · 4 of 4 slices shown]
[im 1/4]
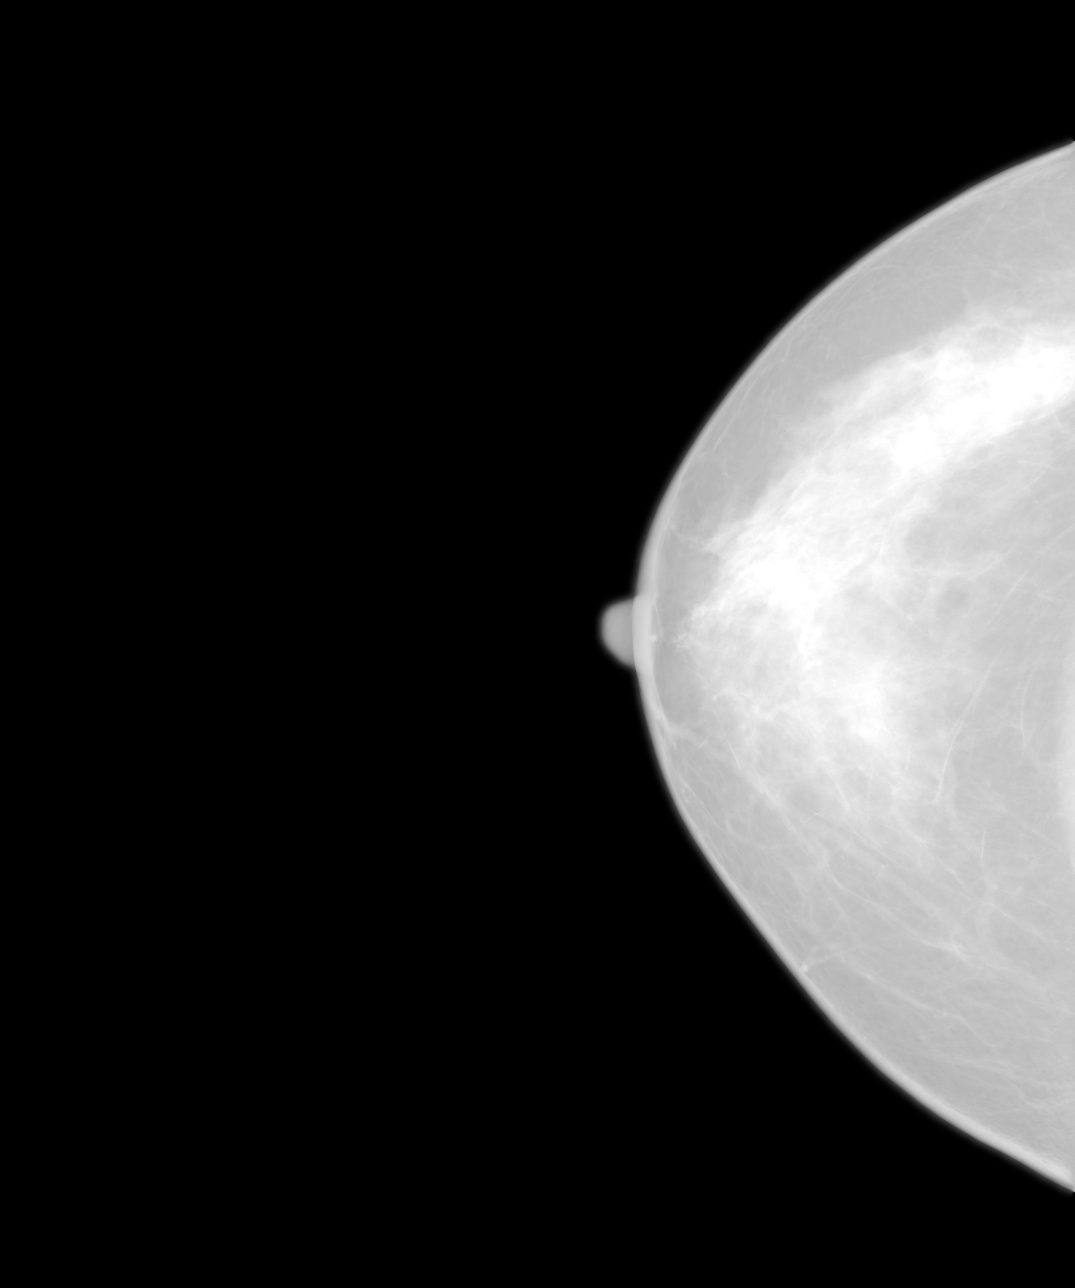
[im 2/4]
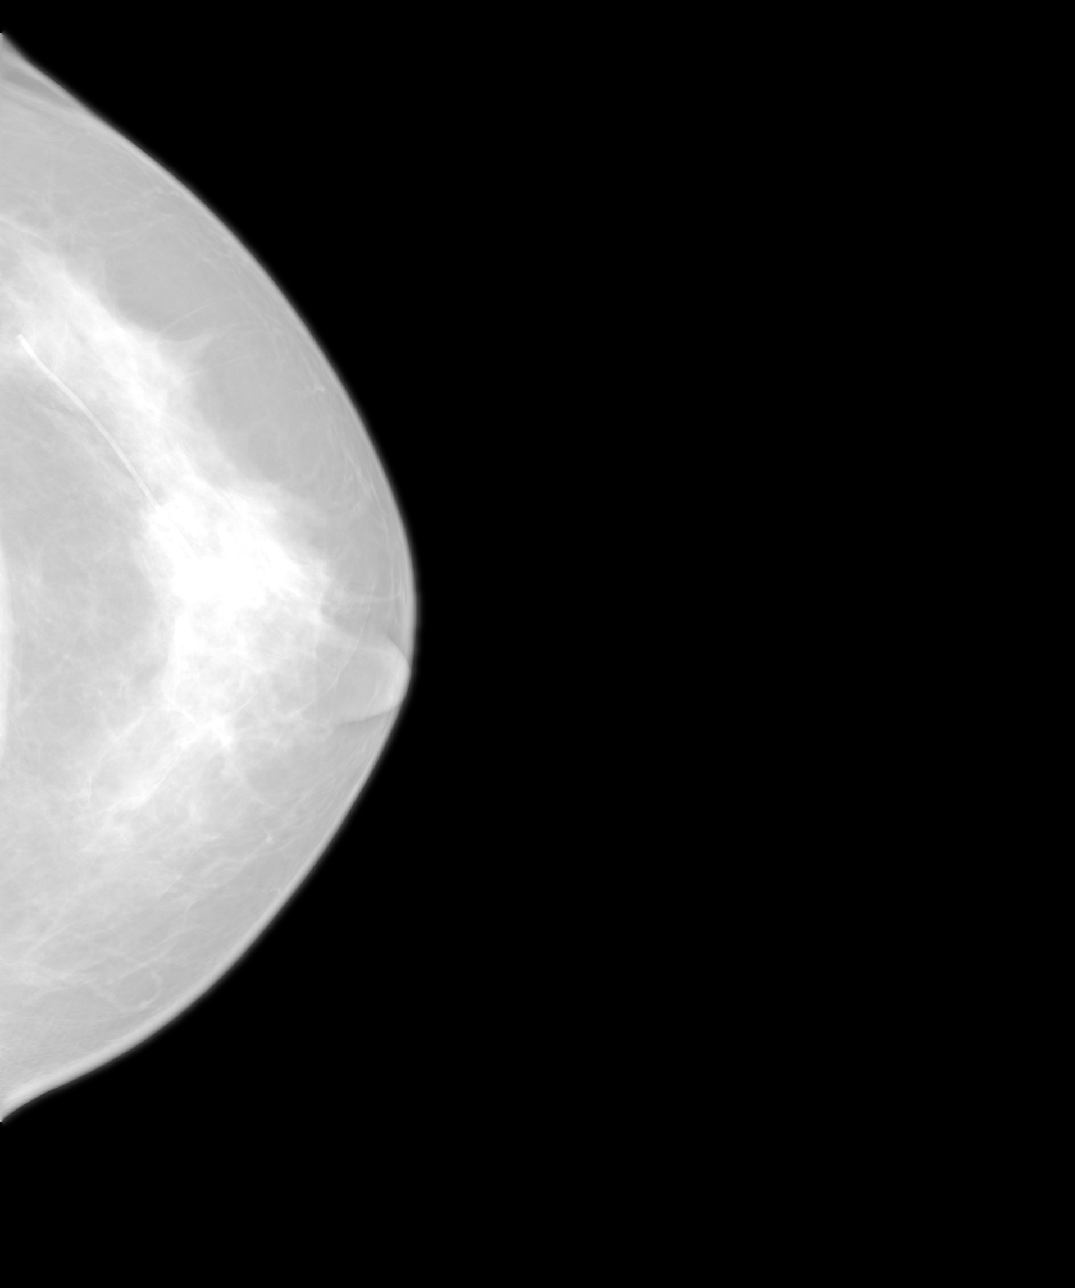
[im 3/4]
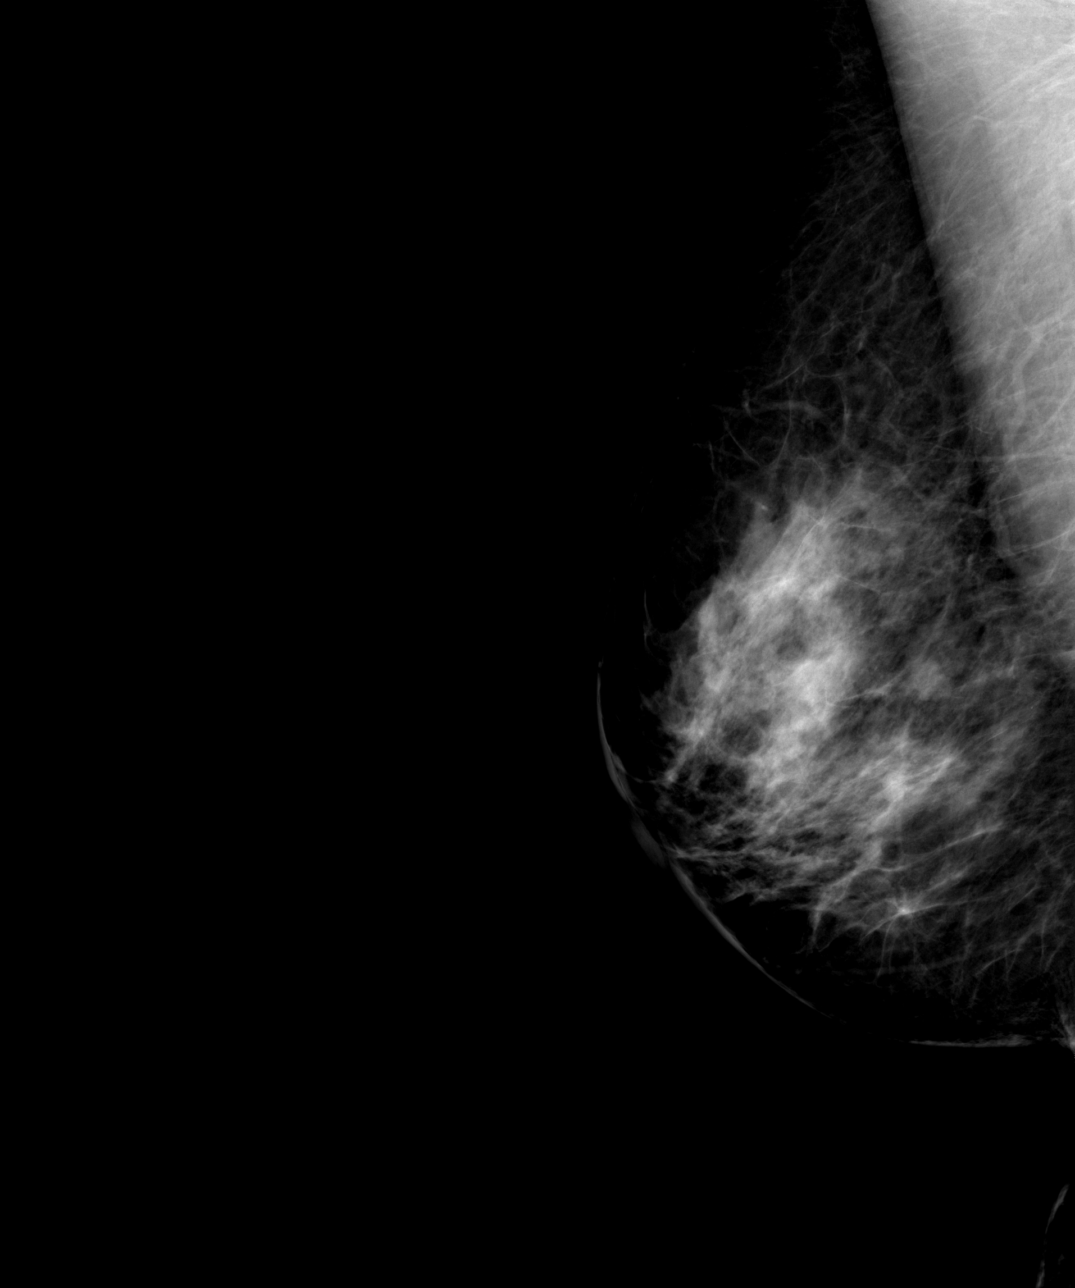
[im 4/4]
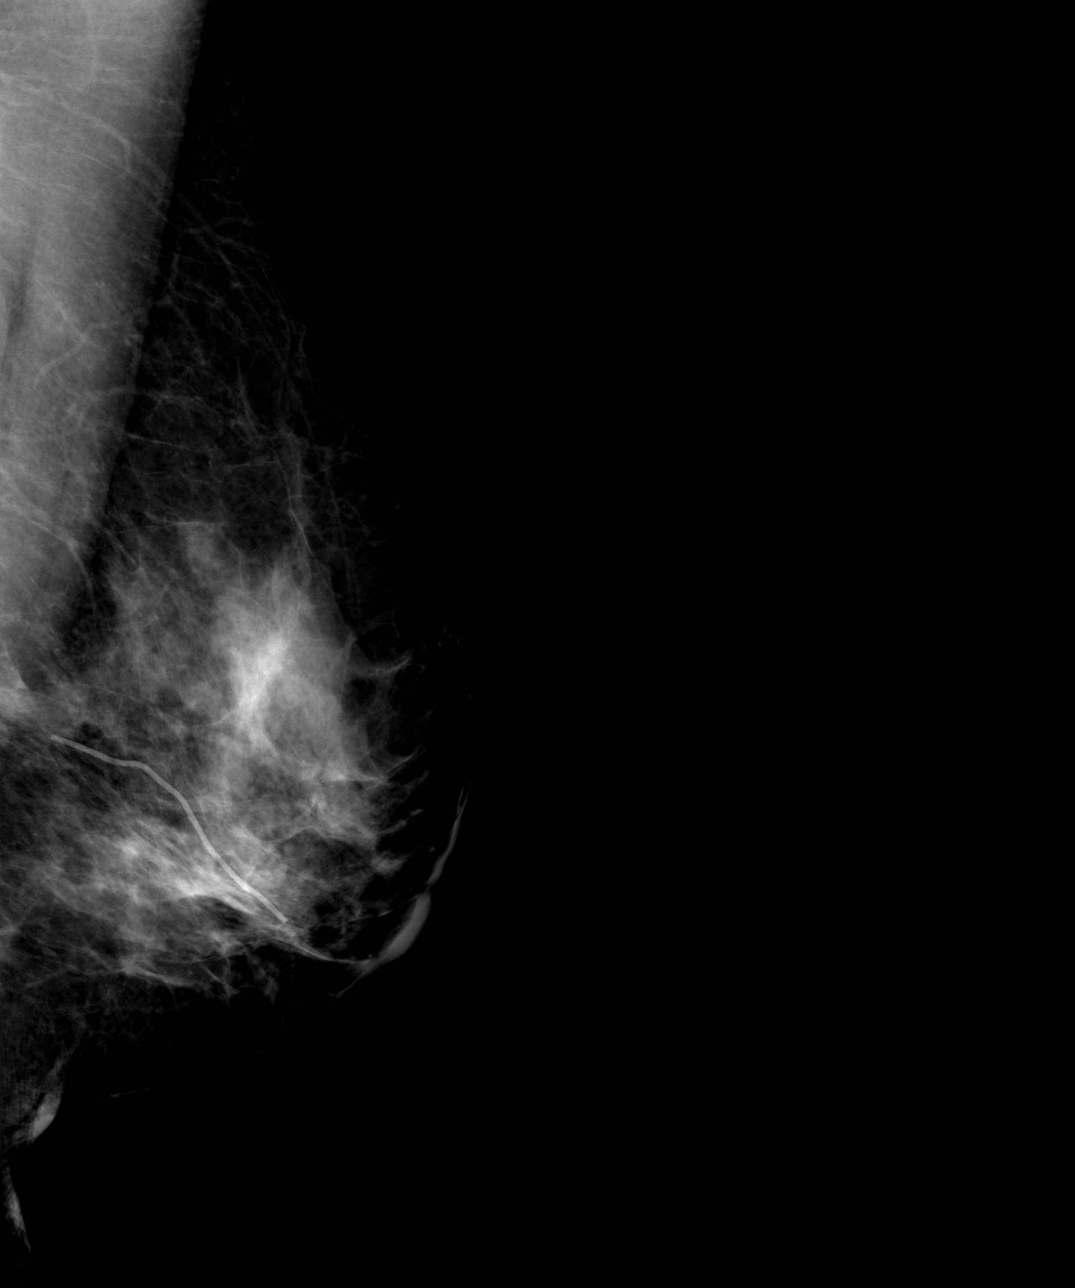

[4 of 4 positions shown; findings below may reference images not displayed]

IMPRESSION: Stable, bilaterally benign-appearing screening mammography.

Annual screening mammography is recommended.

BI-RADS: Category 2 - Benign Finding.

A NEGATIVE MAMMOGRAM REPORT DOES NOT PRECLUDE BIOPSY OR OTHER EVALUATION OF
A CLINICALLY PALPABLE OR OTHERWISE SUSPICIOUS MASS OR LESION.  BREAST CANCER
MAY NOT BE DETECTED BY MAMMOGRAPHY IN UP TO 10% OF CASES.

## 2008-03-15 ENCOUNTER — Ambulatory Visit: Payer: Self-pay | Admitting: Internal Medicine

## 2008-03-15 IMAGING — MG MAM DGTL SCREENING MAMMO W/CAD
1 series · 4 of 4 positions shown · non-contrast
Comparison: none

REASON FOR EXAM: scr mammo
COMMENTS:

[R CC · right · 4 of 4 slices shown]
[im 1/4]
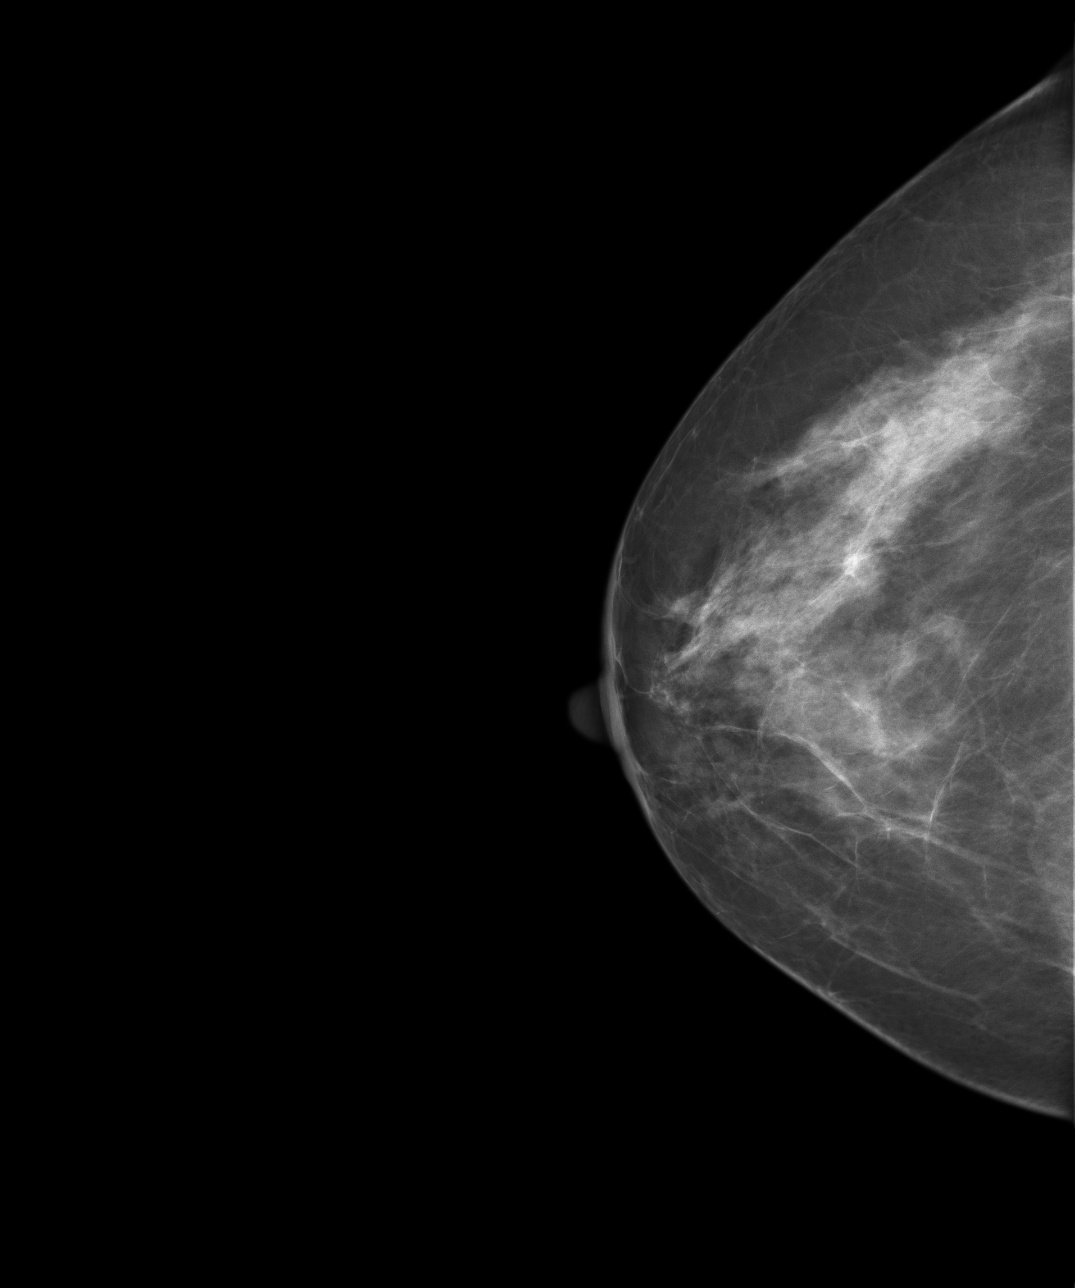
[im 2/4]
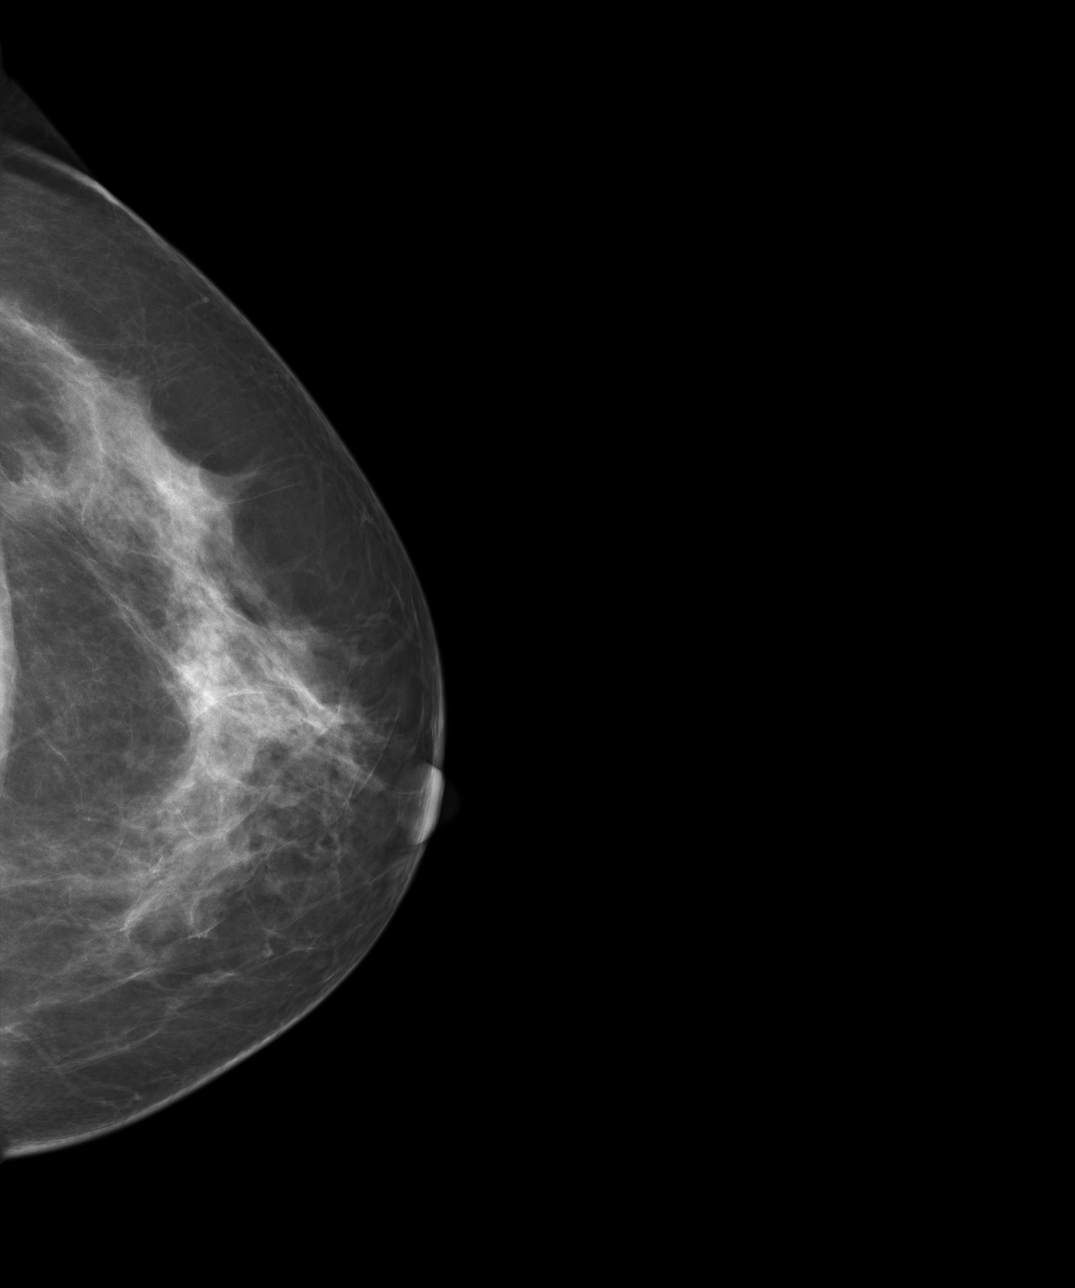
[im 3/4]
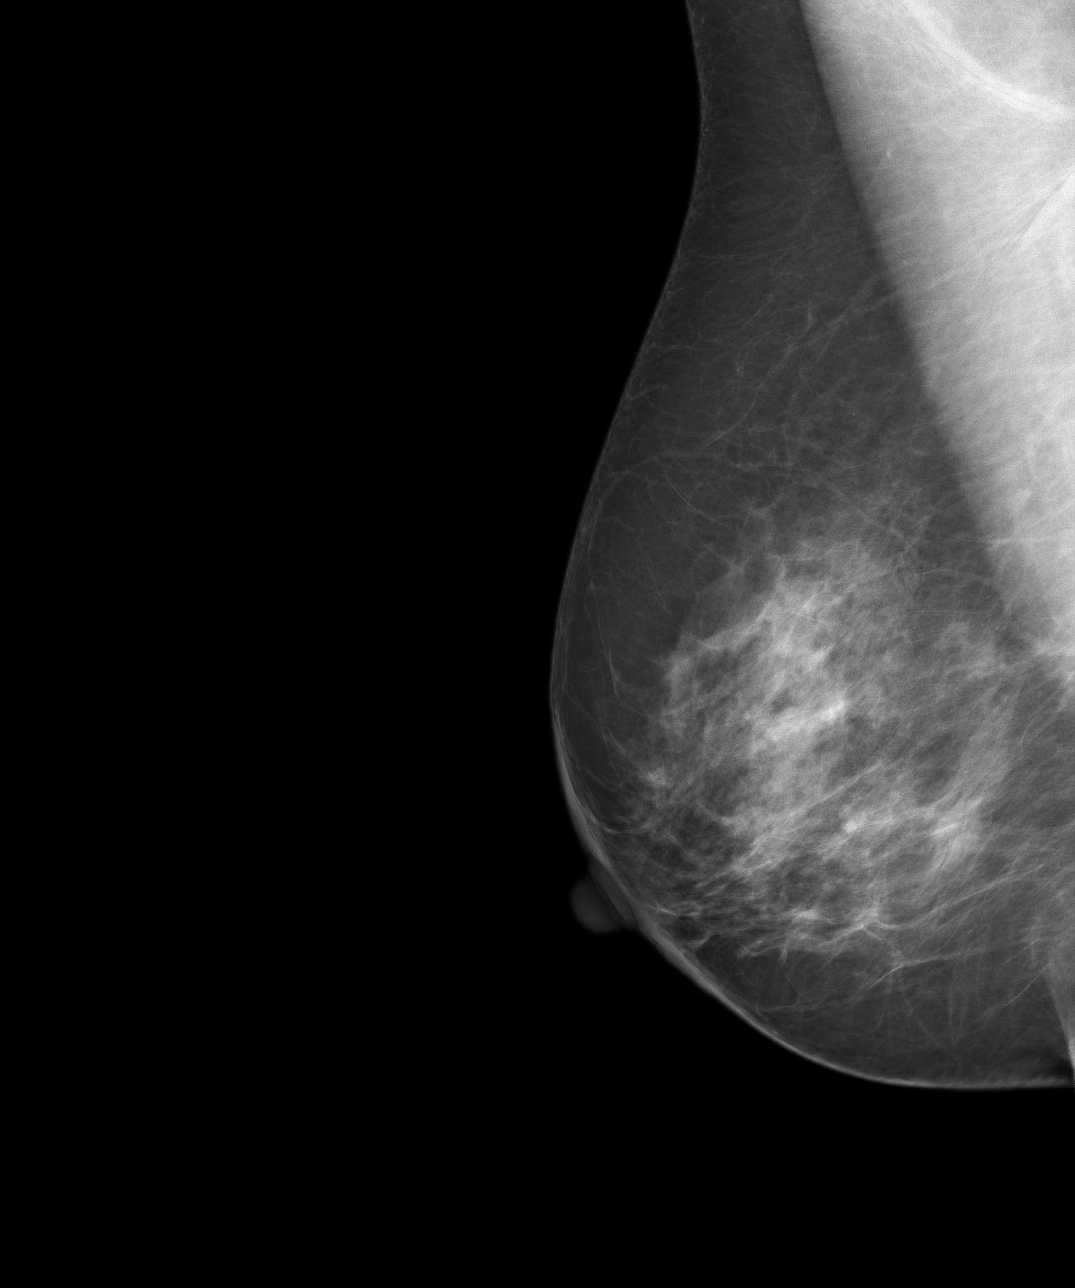
[im 4/4]
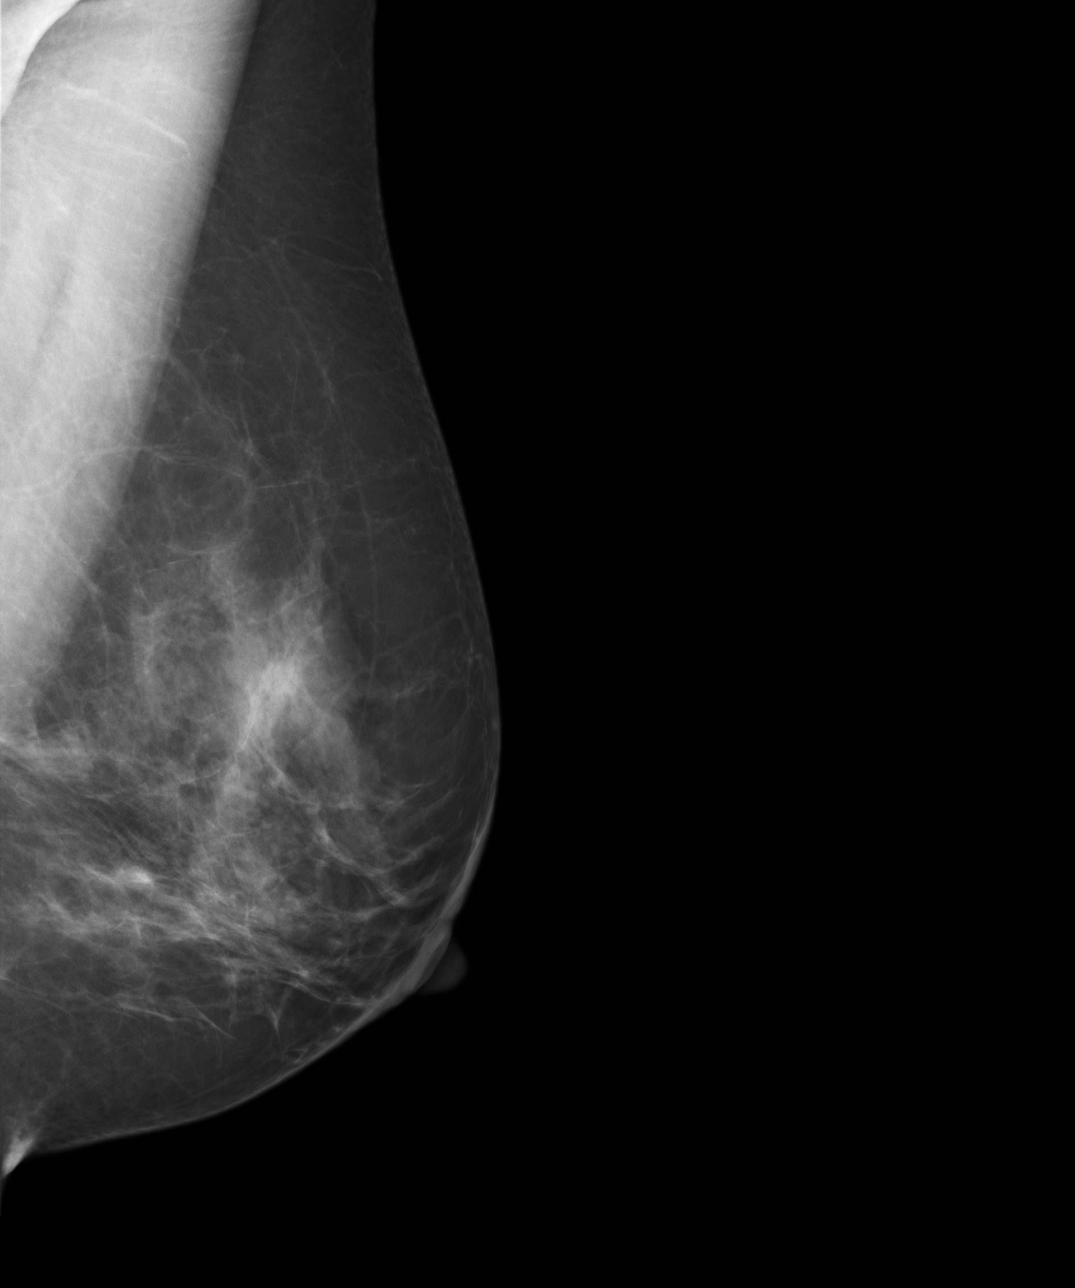

[4 of 4 positions shown; findings below may reference images not displayed]

PROCEDURE:     MAM - MAM DGTL SCREENING MAMMO W/CAD  - [DATE]  [DATE]

RESULT:        Comparison is made to film screen images of [DATE] and to
digital images performed on [DATE] as well as [DATE] and [DATE].

The breasts exhibit a mild to moderately dense parenchymal pattern which
appears to be stable. There is no evidence of an area of developing density,
dominant mass or malignant-appearing calcification.  The patient has
undergone a previous biopsy on the LEFT.
IMPRESSION: 1.     Stable, benign-appearing bilateral mammogram.
2.     BI-RADS:  Category 2-Benign Finding.
3.     Please continue to encourage annual mammographic followup.

A NEGATIVE MAMMOGRAM REPORT DOES NOT PRECLUDE BIOPSY OR OTHER EVALUATION OF
A CLINICALLY PALPABLE OR OTHERWISE SUPSICIOUS MASS OR LESION.  BREAST CANCER
MAY NOT BE DETECTED BY MAMMOGRAPHY IN UP TO 10% OF CASES.

## 2008-06-13 ENCOUNTER — Ambulatory Visit: Payer: Self-pay | Admitting: Gastroenterology

## 2008-09-04 ENCOUNTER — Emergency Department: Payer: Self-pay | Admitting: Emergency Medicine

## 2008-09-04 IMAGING — CR DG CHEST 2V
1 series · 2 of 2 positions shown · non-contrast
Comparison: none

REASON FOR EXAM: SOB
COMMENTS:

PROCEDURE:     DXR - DXR CHEST PA (OR AP) AND LATERAL  - [DATE]  [DATE]
RESULT:     The lung fields are clear of infiltrate. No pneumonia,
pneumothorax or pleural effusion seen. The chest is hyperexpanded compatible
with COPD. Heart size is normal. No significant osseous abnormalities are
noted.

[Series 1: view not recorded · 0.17mm/px · 2 of 2 slices shown]
[im 1/2]
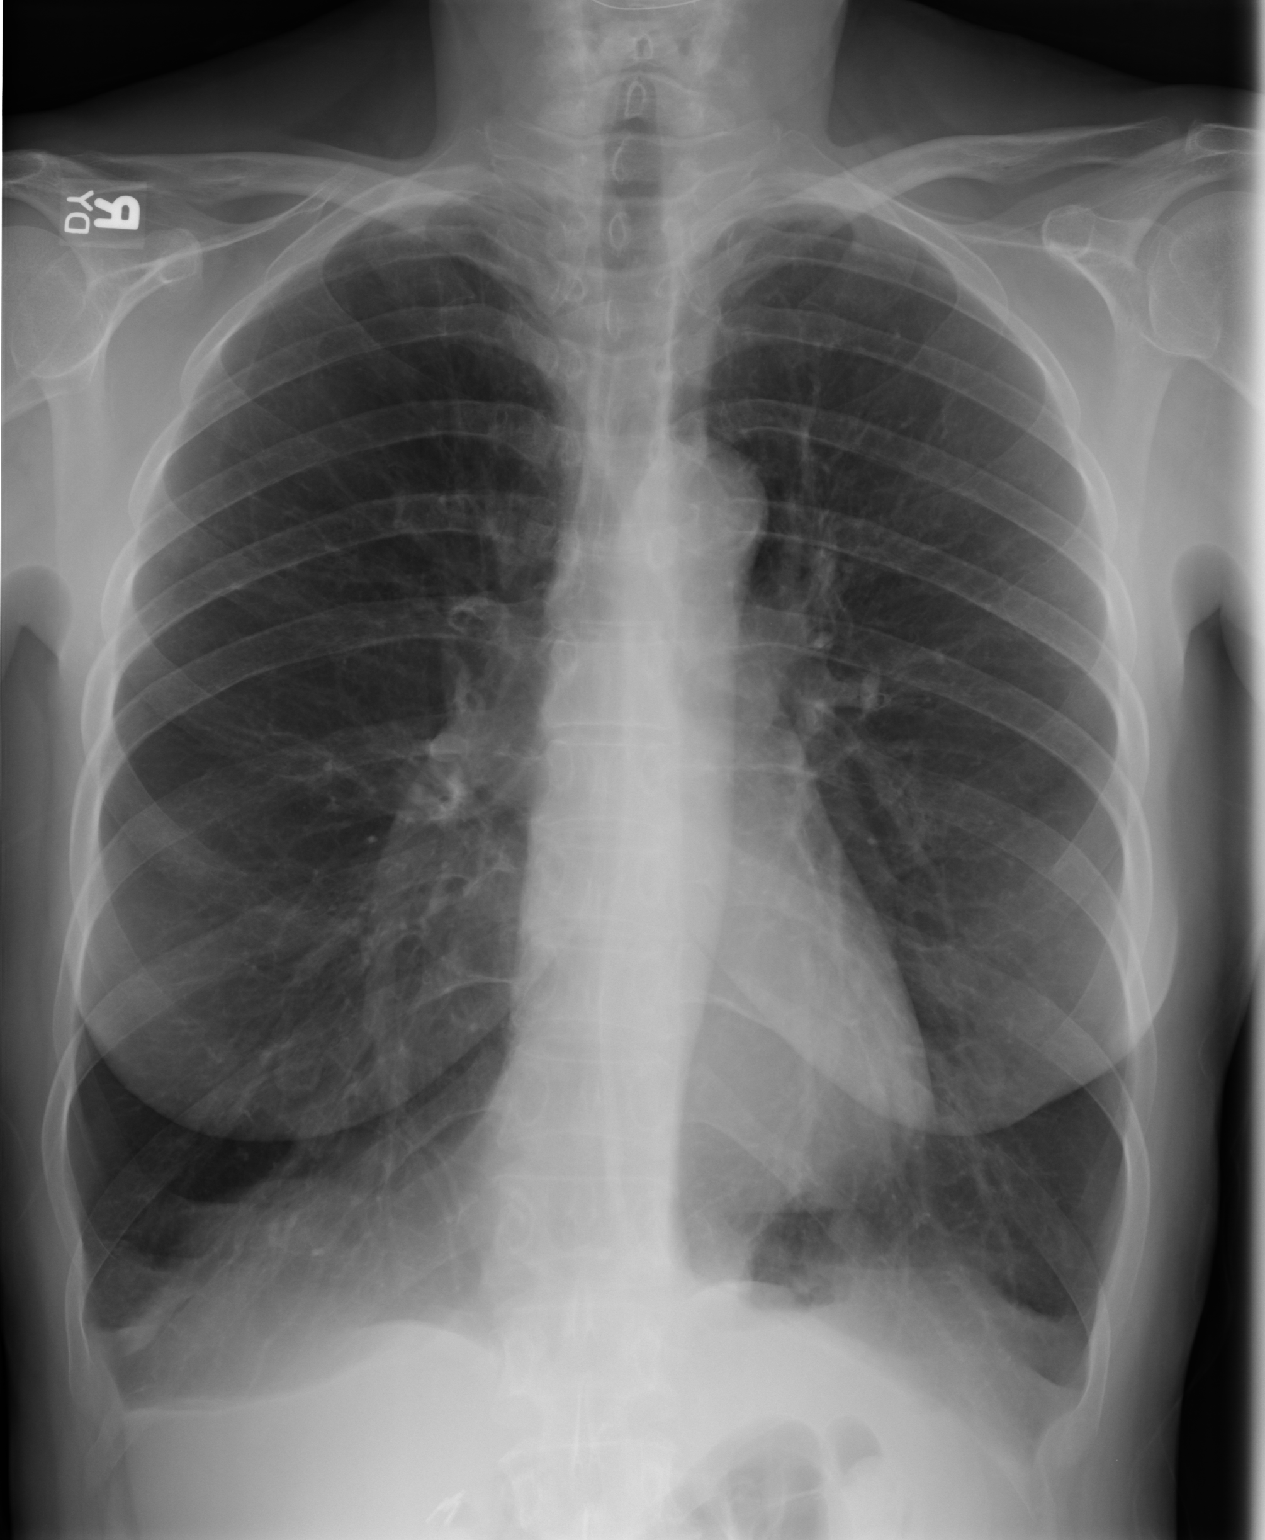
[im 2/2]
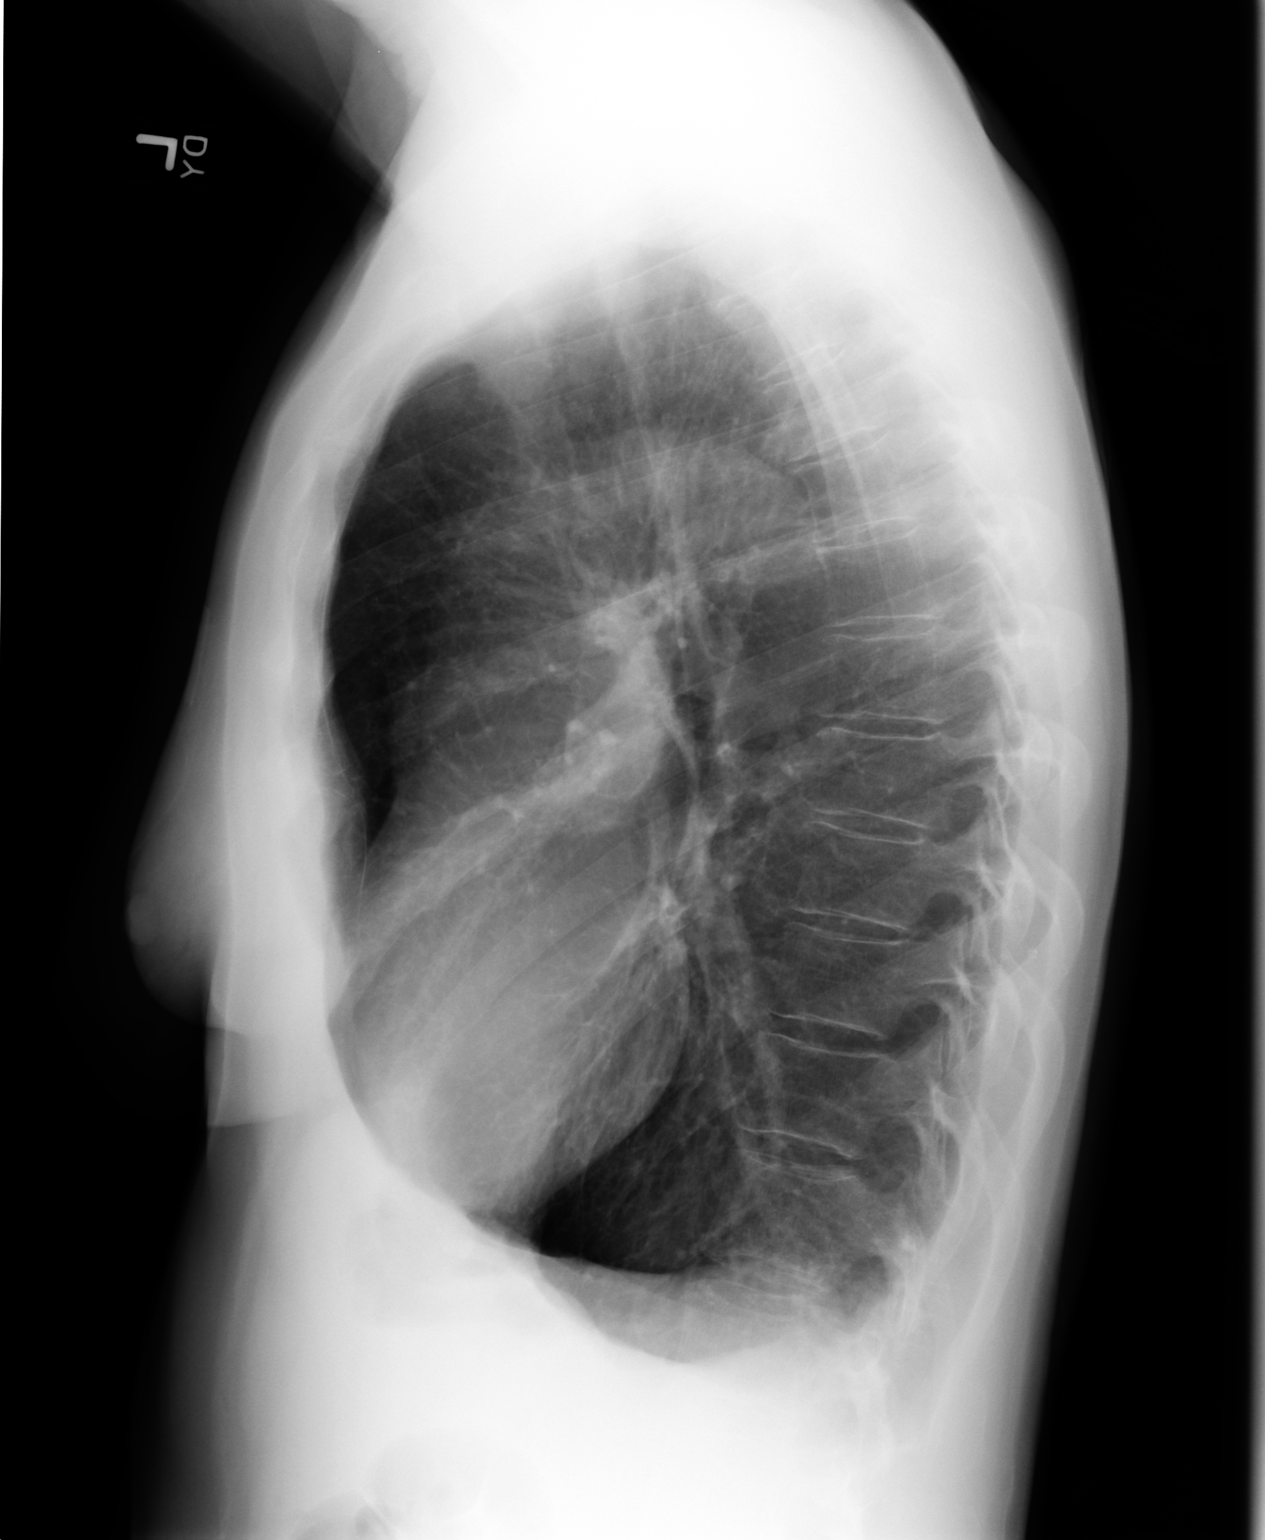

[2 of 2 positions shown; findings below may reference images not displayed]

IMPRESSION: 1. The lung fields are clear of infiltrate.
2. COPD.

## 2009-03-08 ENCOUNTER — Ambulatory Visit: Payer: Self-pay | Admitting: Specialist

## 2010-02-07 ENCOUNTER — Ambulatory Visit: Payer: Self-pay | Admitting: Internal Medicine

## 2010-02-07 IMAGING — MG MAM DGTL SCREENING MAMMO W/CAD
1 series · 4 of 4 positions shown · non-contrast
Comparison: none

REASON FOR EXAM: scr
COMMENTS:

[Series 1758: R CC · right · 4 of 4 slices shown]
[im 1/4]
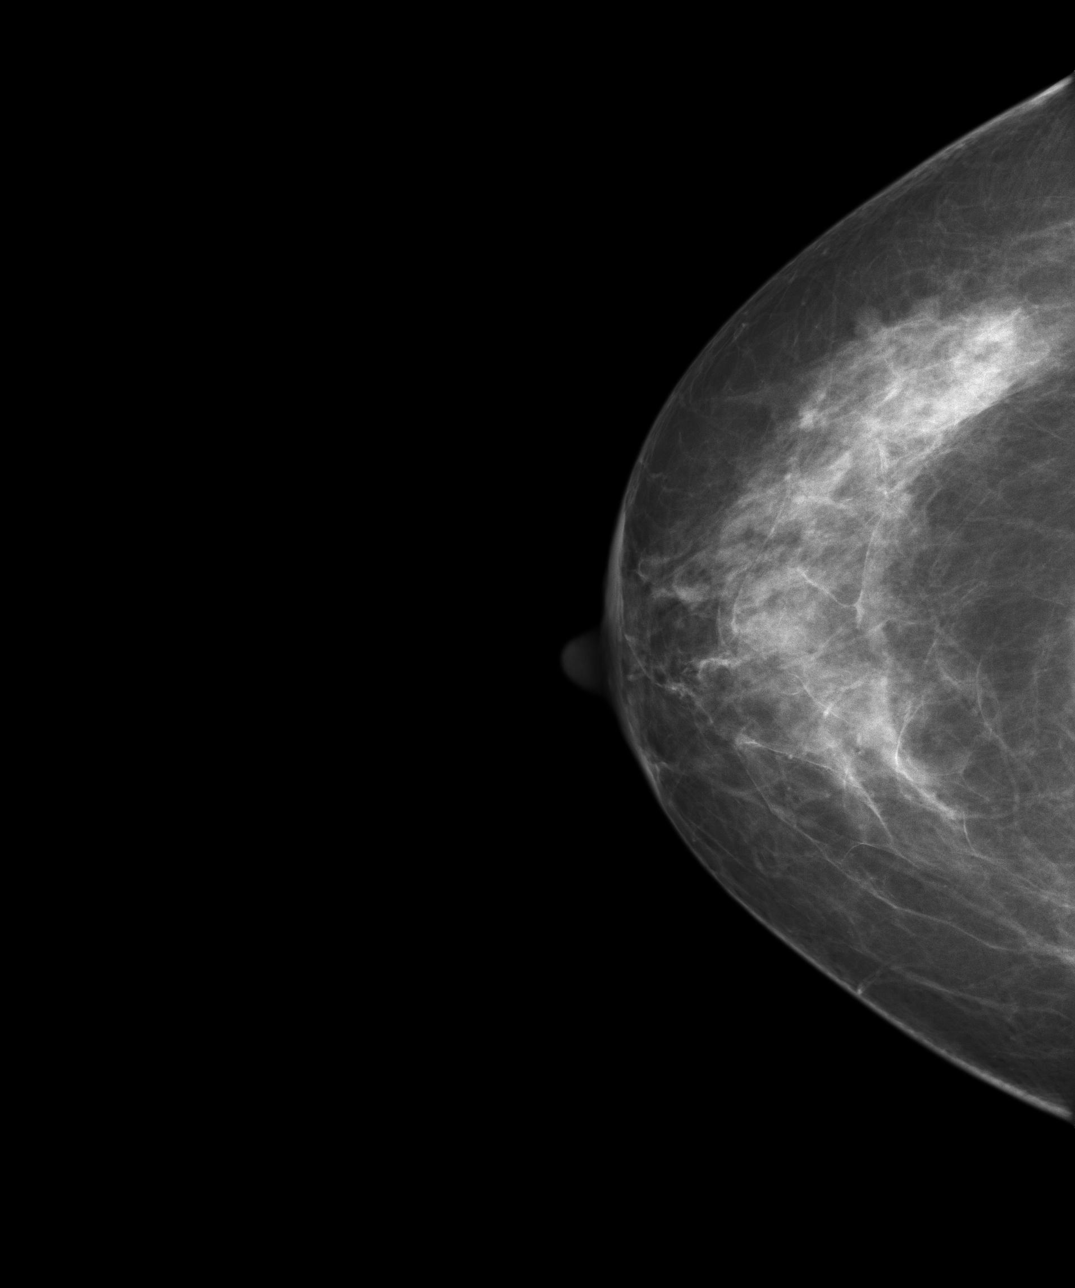
[im 2/4]
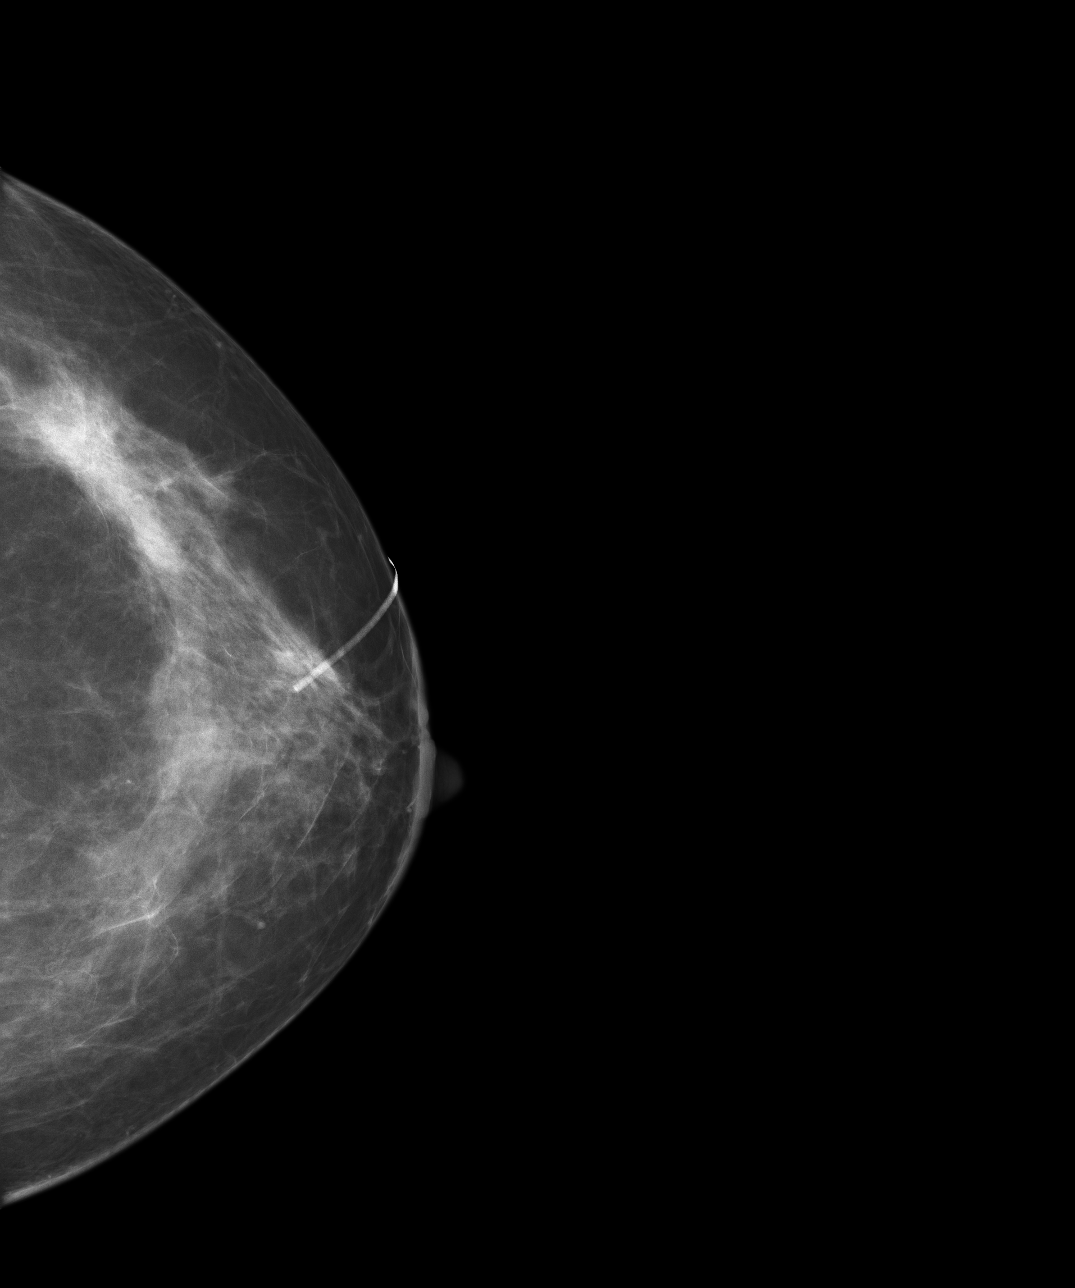
[im 3/4]
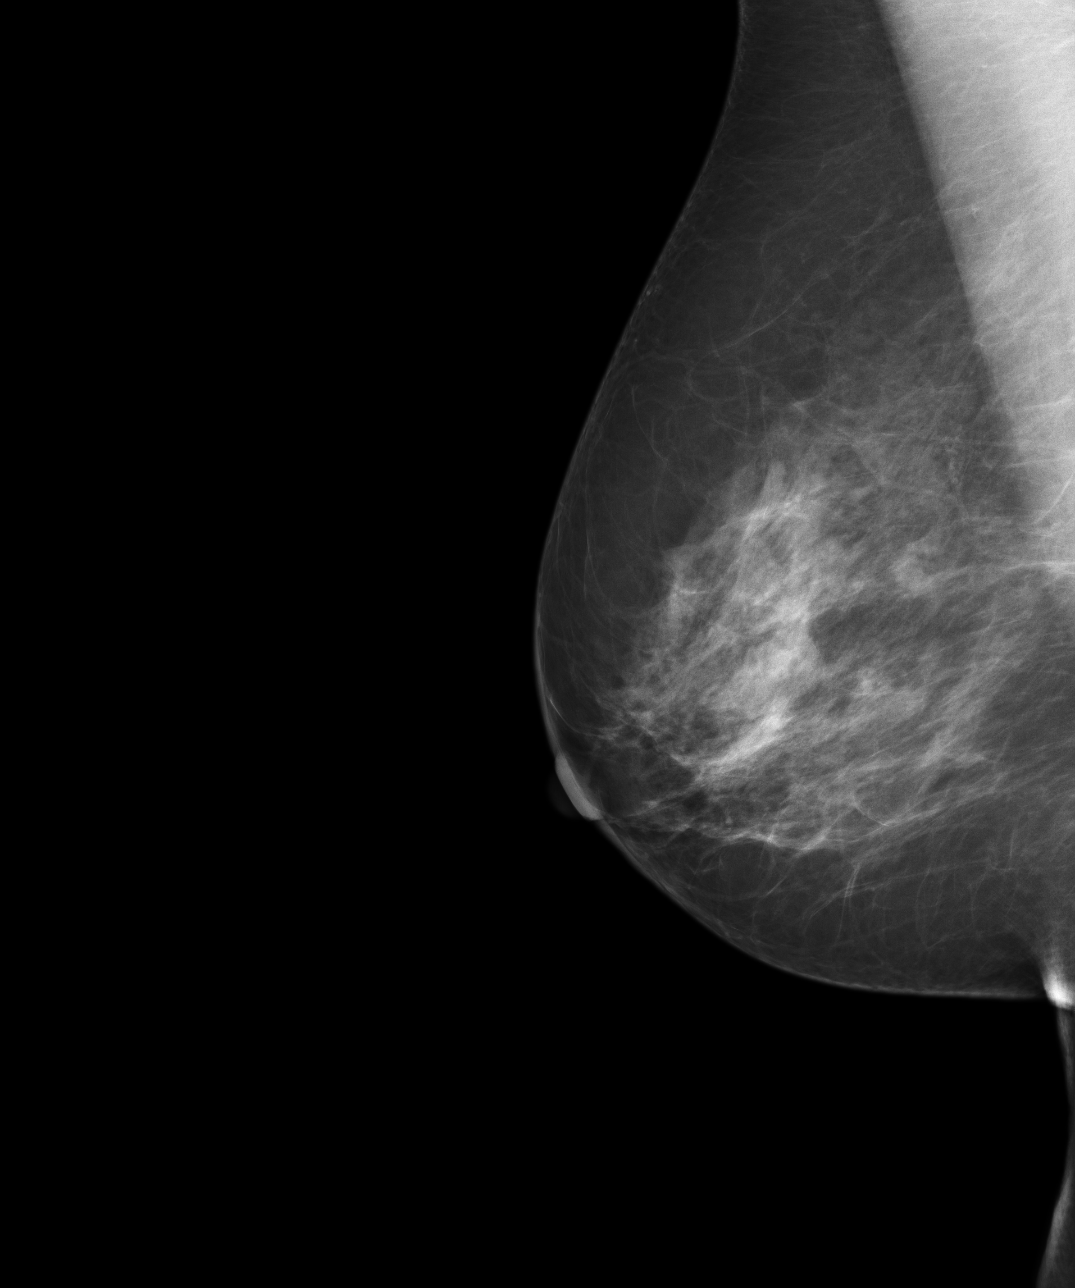
[im 4/4]
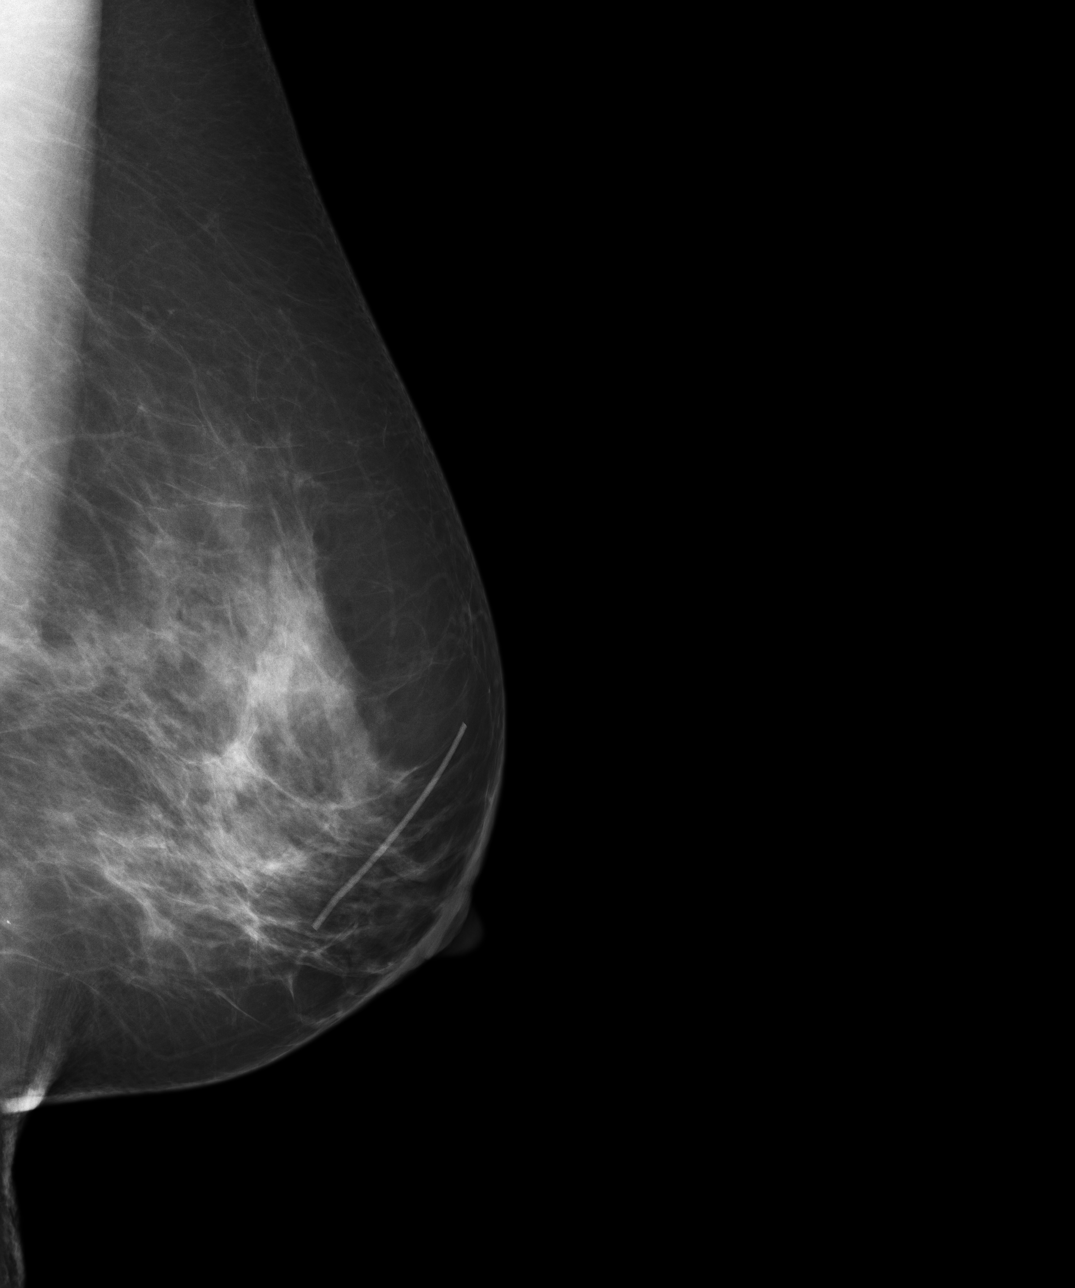

[4 of 4 positions shown; findings below may reference images not displayed]

PROCEDURE:     MAM - MAM DGTL SCREENING MAMMO W/CAD  - [DATE] [DATE]

RESULT:      Comparison is made to prior examinations of [DATE], [DATE] and
[DATE].

There are scattered fibroglandular densities bilaterally.  A linear skin
marker is noted laterally at the anterior aspect of the left breast denoting
the incision site for prior left breast biopsy.  No mass or
malignant-appearing calcifications are seen.
IMPRESSION: 1.     Bilateral benign-appearing screening mammography.
2.     Continued annual screening mammography is recommended.
3.     BI-RADS: Category 1-Negative.

A negative mammogram report does not preclude biopsy or other evaluation of
a clinically palpable or otherwise suspicious mass or lesion. Breast cancer
may not be detected by mammography in up to 10% of cases.

## 2011-05-16 ENCOUNTER — Ambulatory Visit: Payer: Self-pay | Admitting: Internal Medicine

## 2012-05-29 ENCOUNTER — Ambulatory Visit: Payer: Self-pay | Admitting: Internal Medicine

## 2012-05-29 IMAGING — MG MM CAD SCREENING MAMMO
1 series · 5 of 5 positions shown · non-contrast
Comparison: none

REASON FOR EXAM: SCR MAMMO NO ORDER
COMMENTS:

PROCEDURE:     MAM - MAM DGTL SCRN MAM NO ORDER W/CAD  - [DATE] [DATE]
RESULT:     Comparison is made to prior studies dated [DATE], [DATE],
and [DATE].
The breasts demonstrate a heterogeneous parenchymal pattern. There is no new
radiographic evidence to suggest malignancy.

[R CC · right · 5 of 5 slices shown]
[im 1/5]
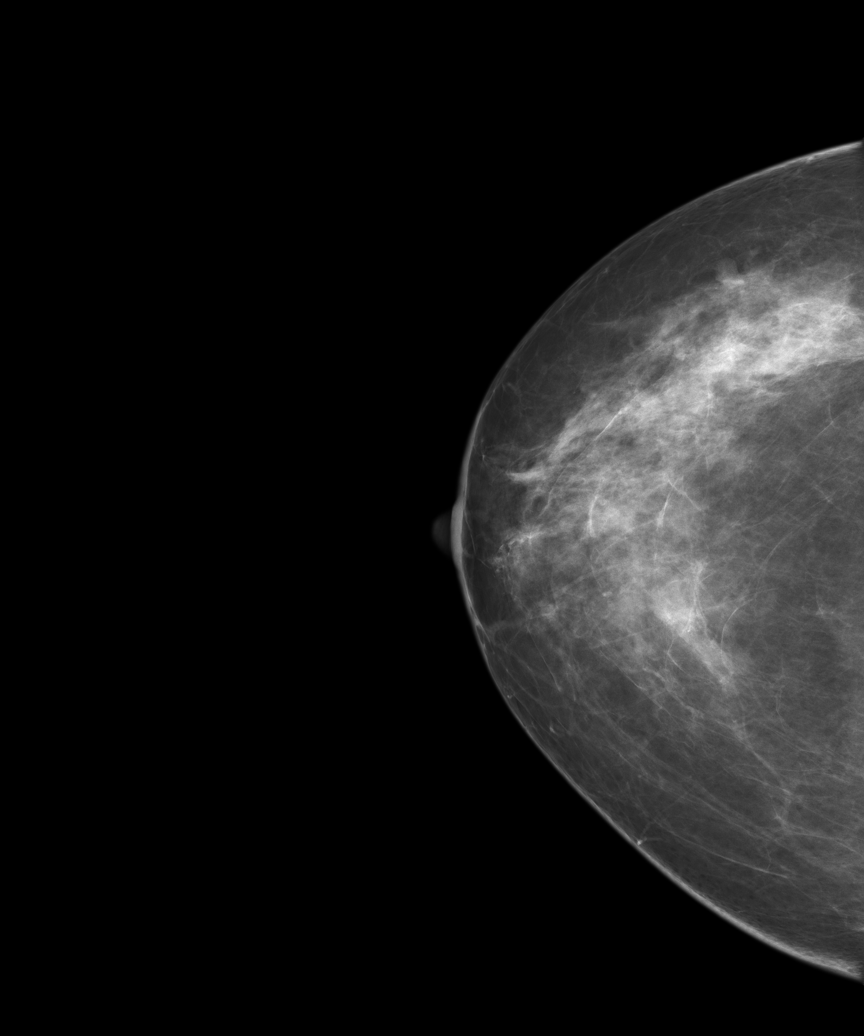
[im 2/5]
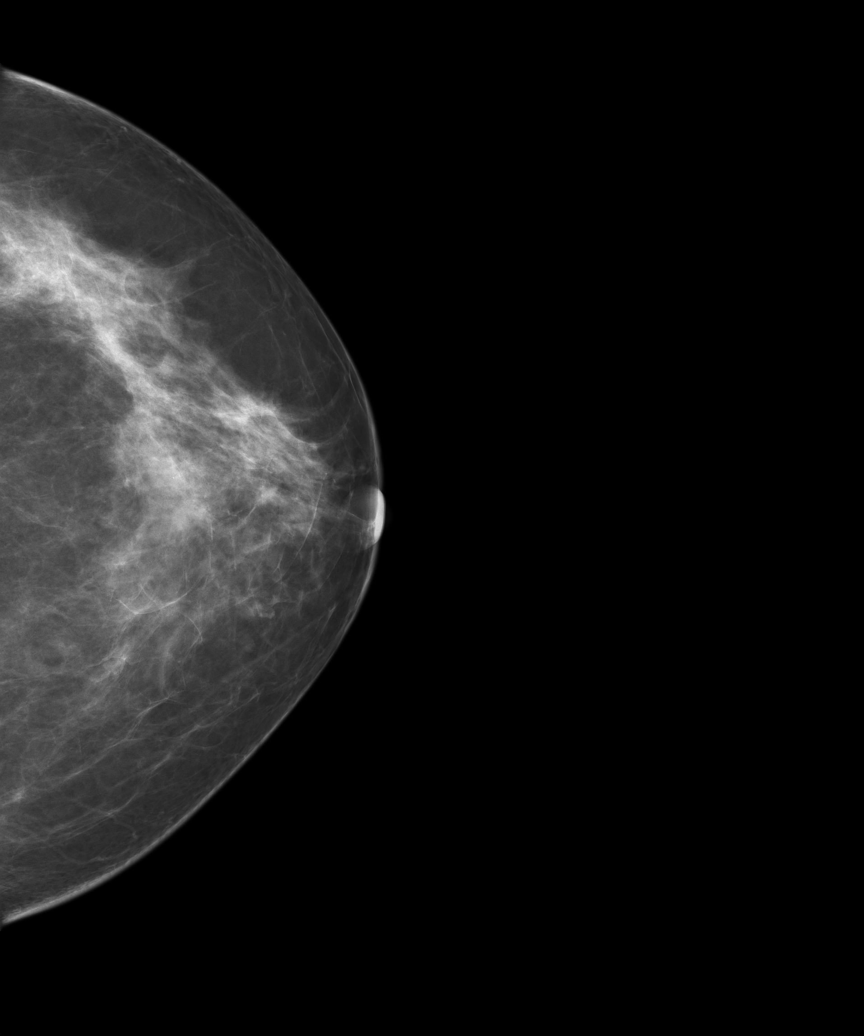
[im 3/5]
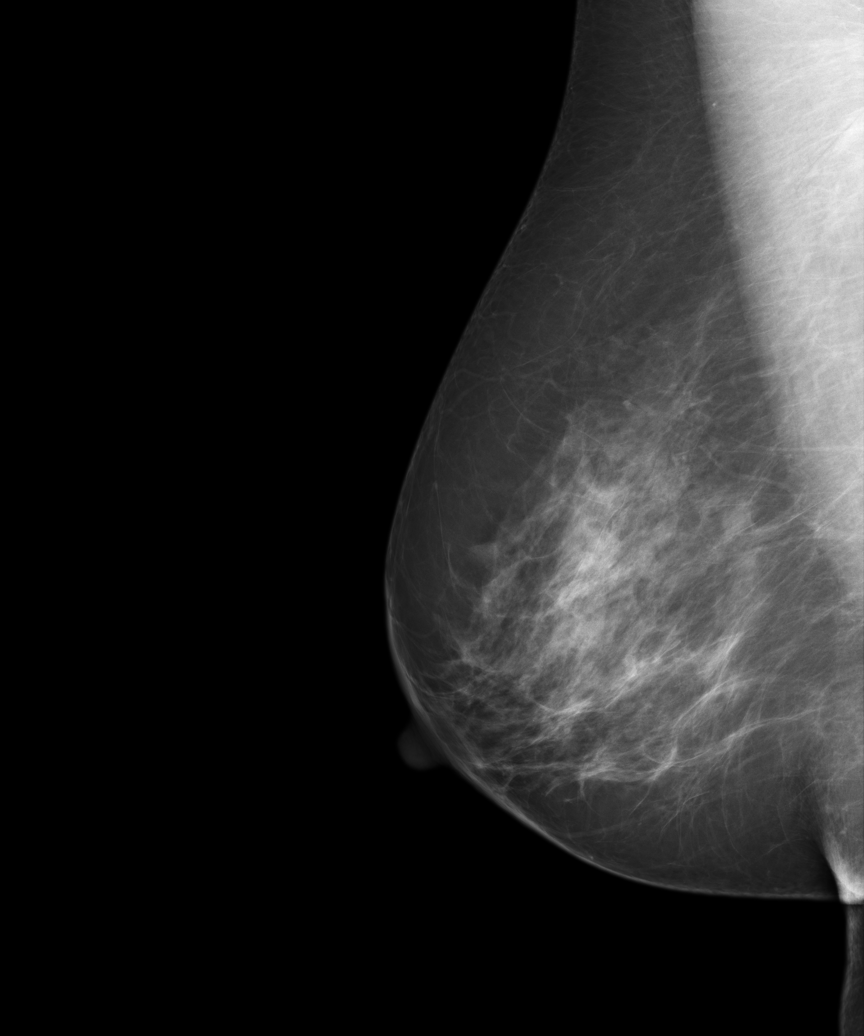
[im 4/5]
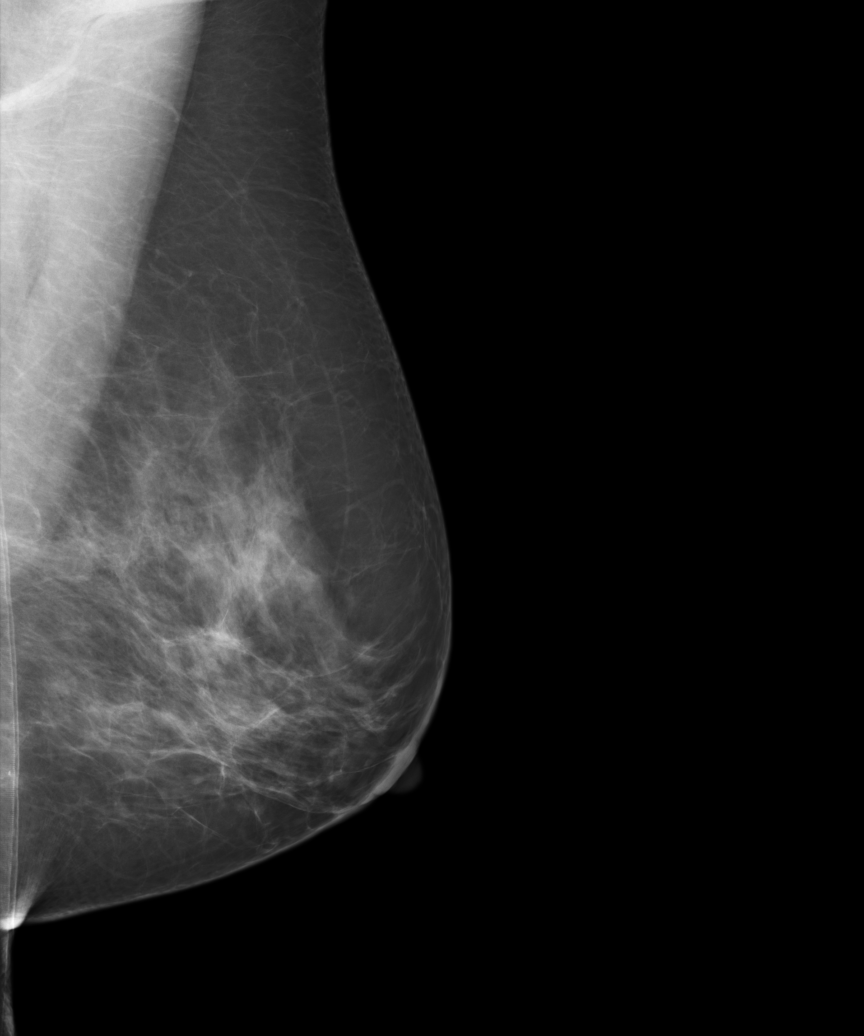
[im 5/5]
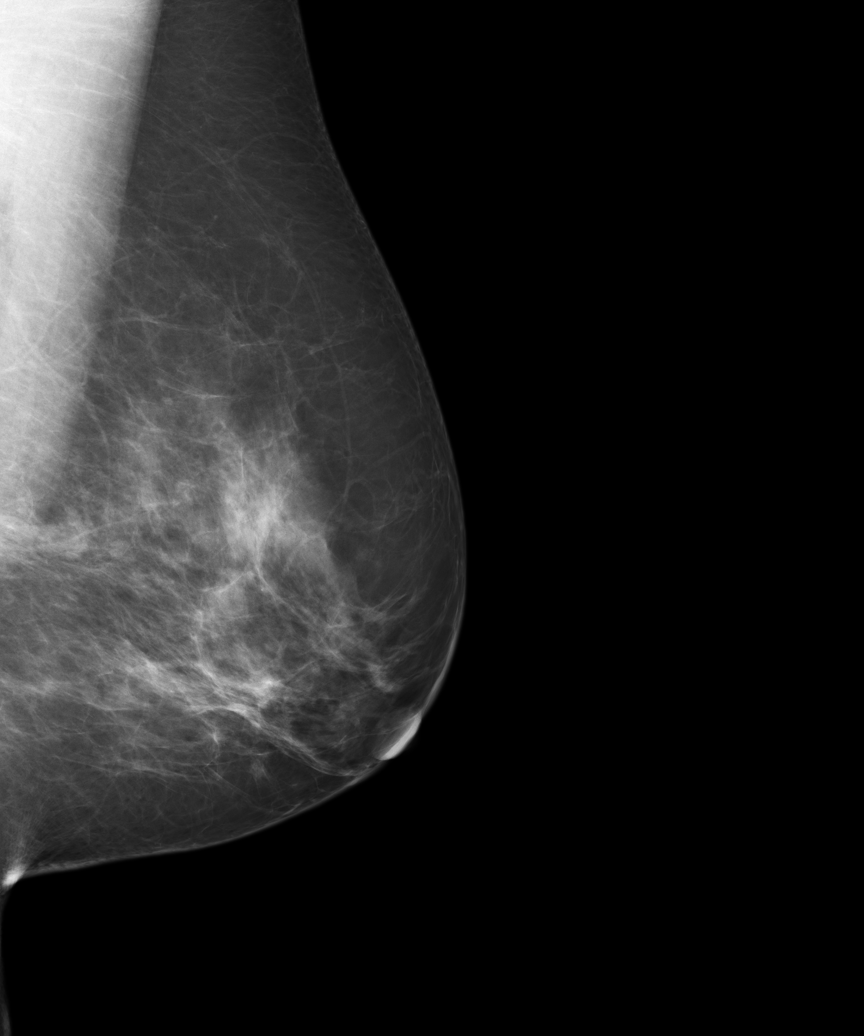

[5 of 5 positions shown; findings below may reference images not displayed]

IMPRESSION: BI-RADS: Category 2 - Benign Findings

A NEGATIVE MAMMOGRAM REPORT DOES NOT PRECLUDE BIOPSY OR OTHER EVALUATION OF
A CLINICALLY PALPABLE OR OTHERWISE SUSPICIOUS MASS OR LESION. BREAST CANCER
MAY NOT BE DETECTED BY MAMMOGRAPHY IN UP TO 10% OF CASES.

## 2013-06-02 ENCOUNTER — Ambulatory Visit: Payer: Self-pay | Admitting: Internal Medicine

## 2013-06-02 IMAGING — MG MM DIGITAL SCREENING BILAT W/ CAD
1 series · 4 of 4 positions shown · non-contrast
Comparison: Previous exam(s).

REASON FOR EXAM: SCR MAMMO NO ORDER
COMMENTS:

PROCEDURE:     MAM - MAM DGTL SCRN MAM NO ORDER W/CAD  - [DATE] [DATE]
CLINICAL DATA: Screening.
EXAM:
DIGITAL SCREENING BILATERAL MAMMOGRAM WITH CAD

[R CC · right · 4 of 4 slices shown]
[im 1/4]
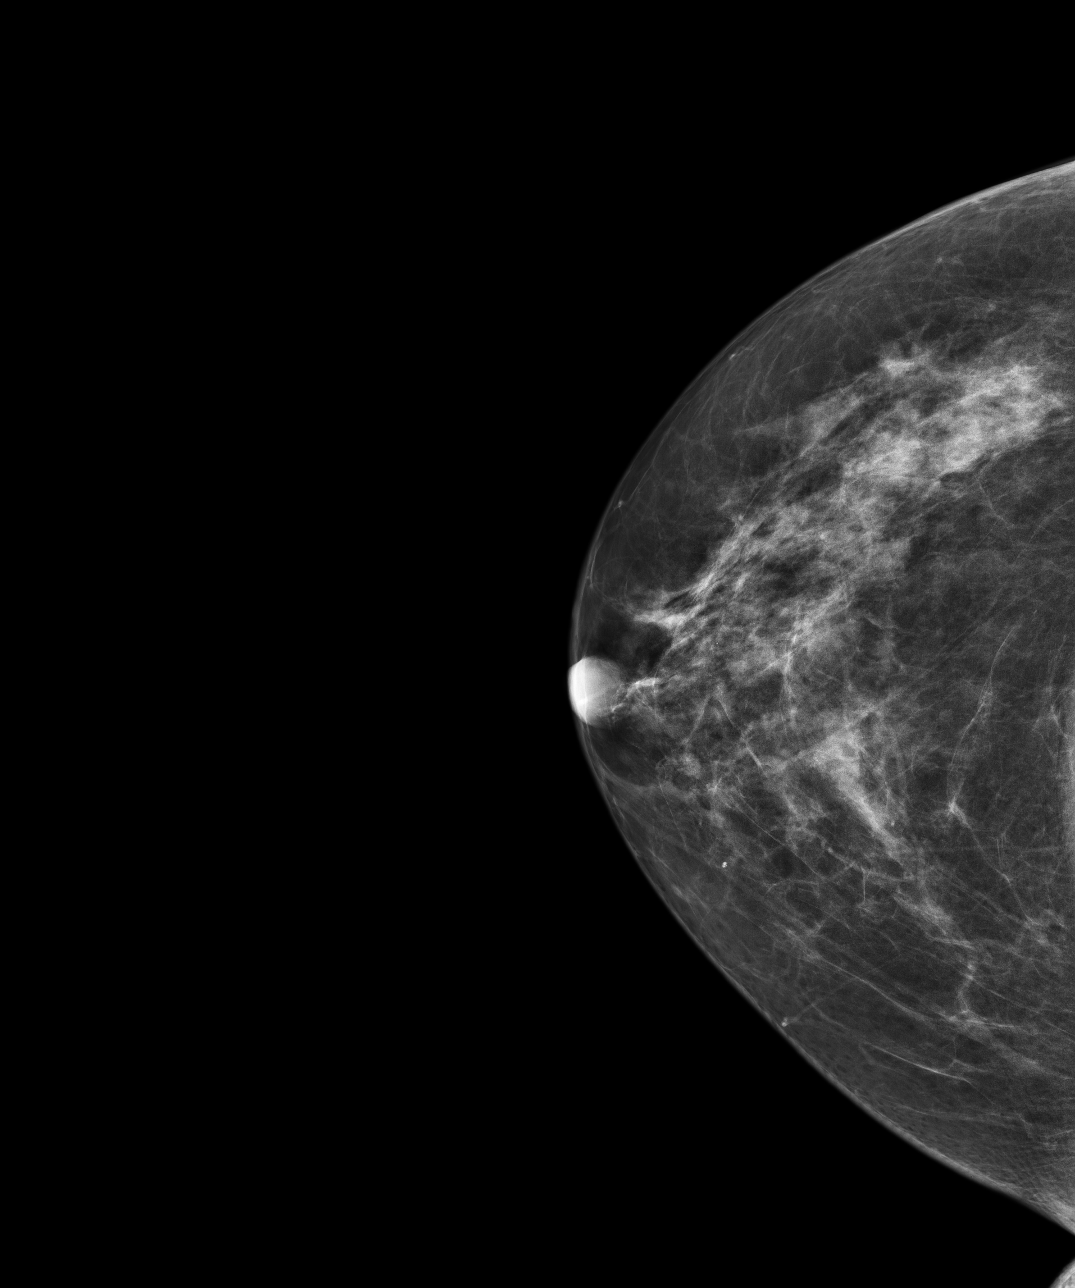
[im 2/4]
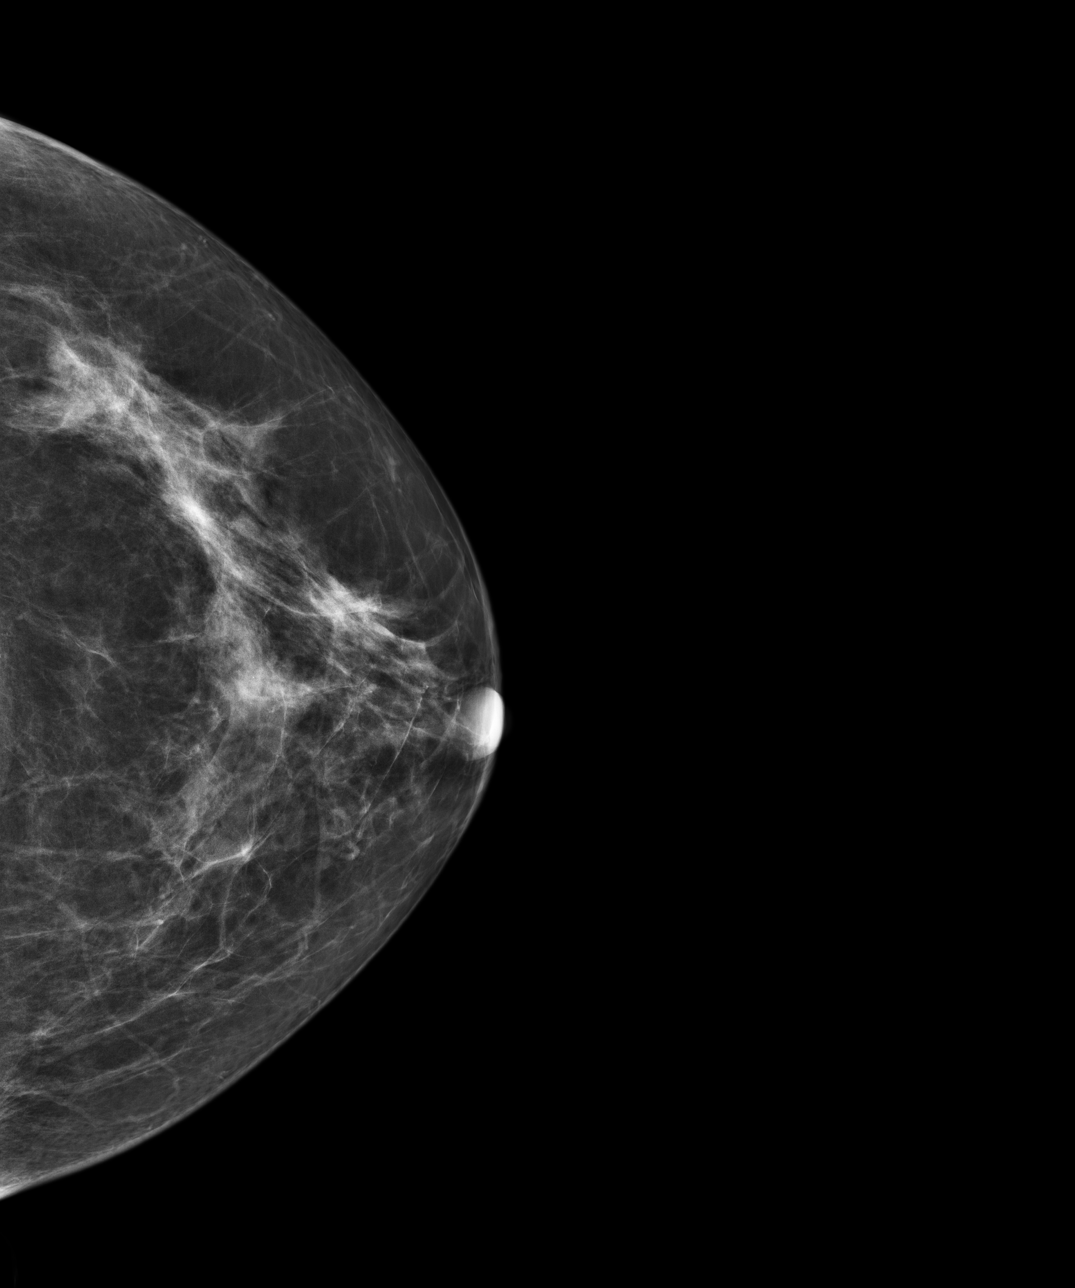
[im 3/4]
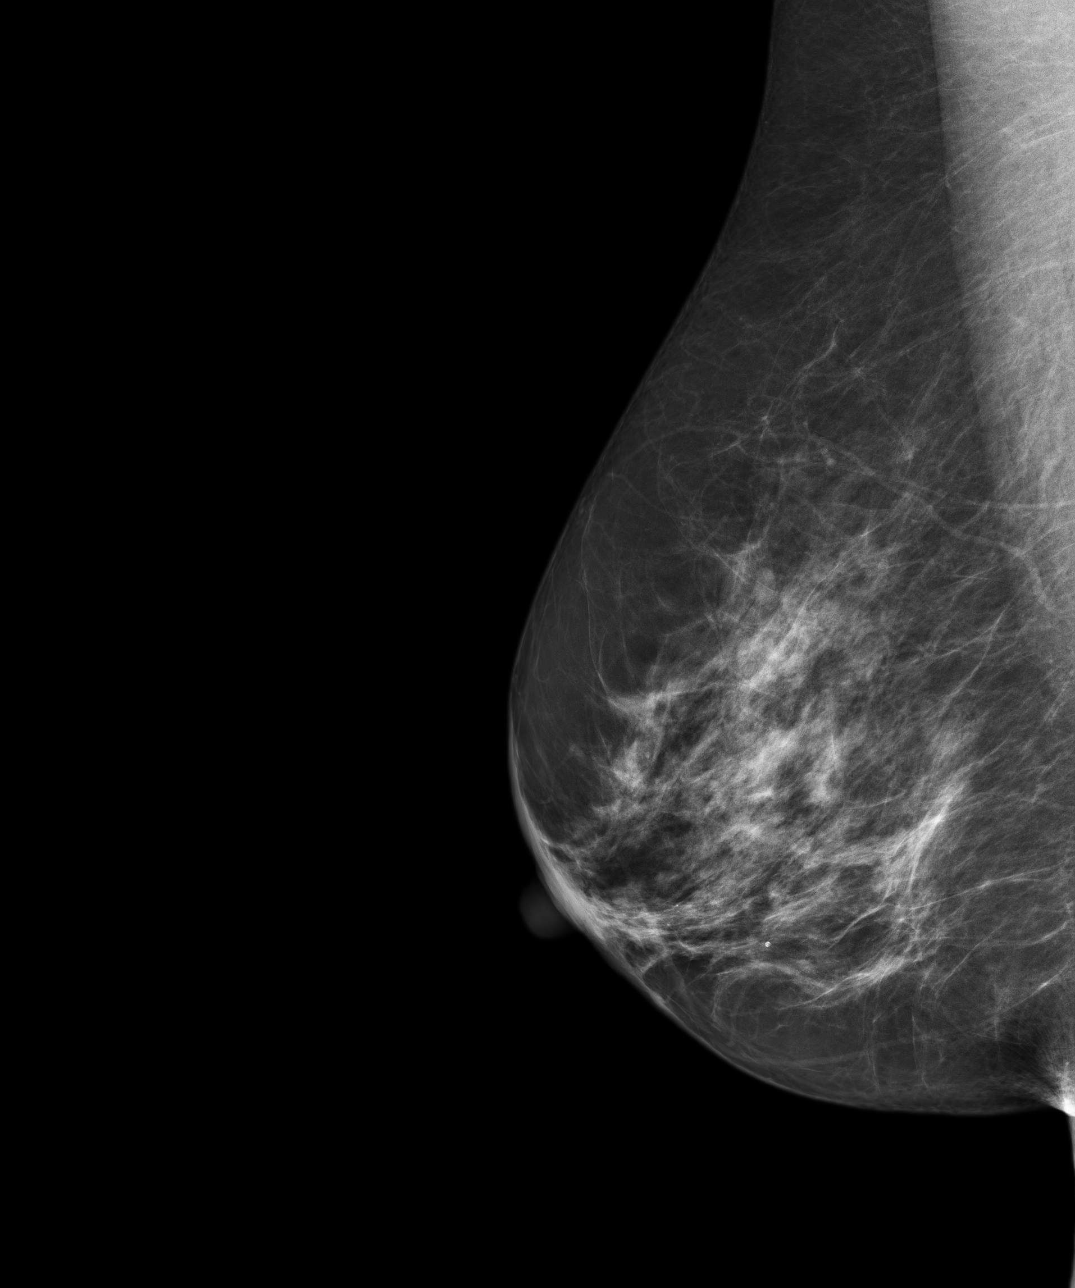
[im 4/4]
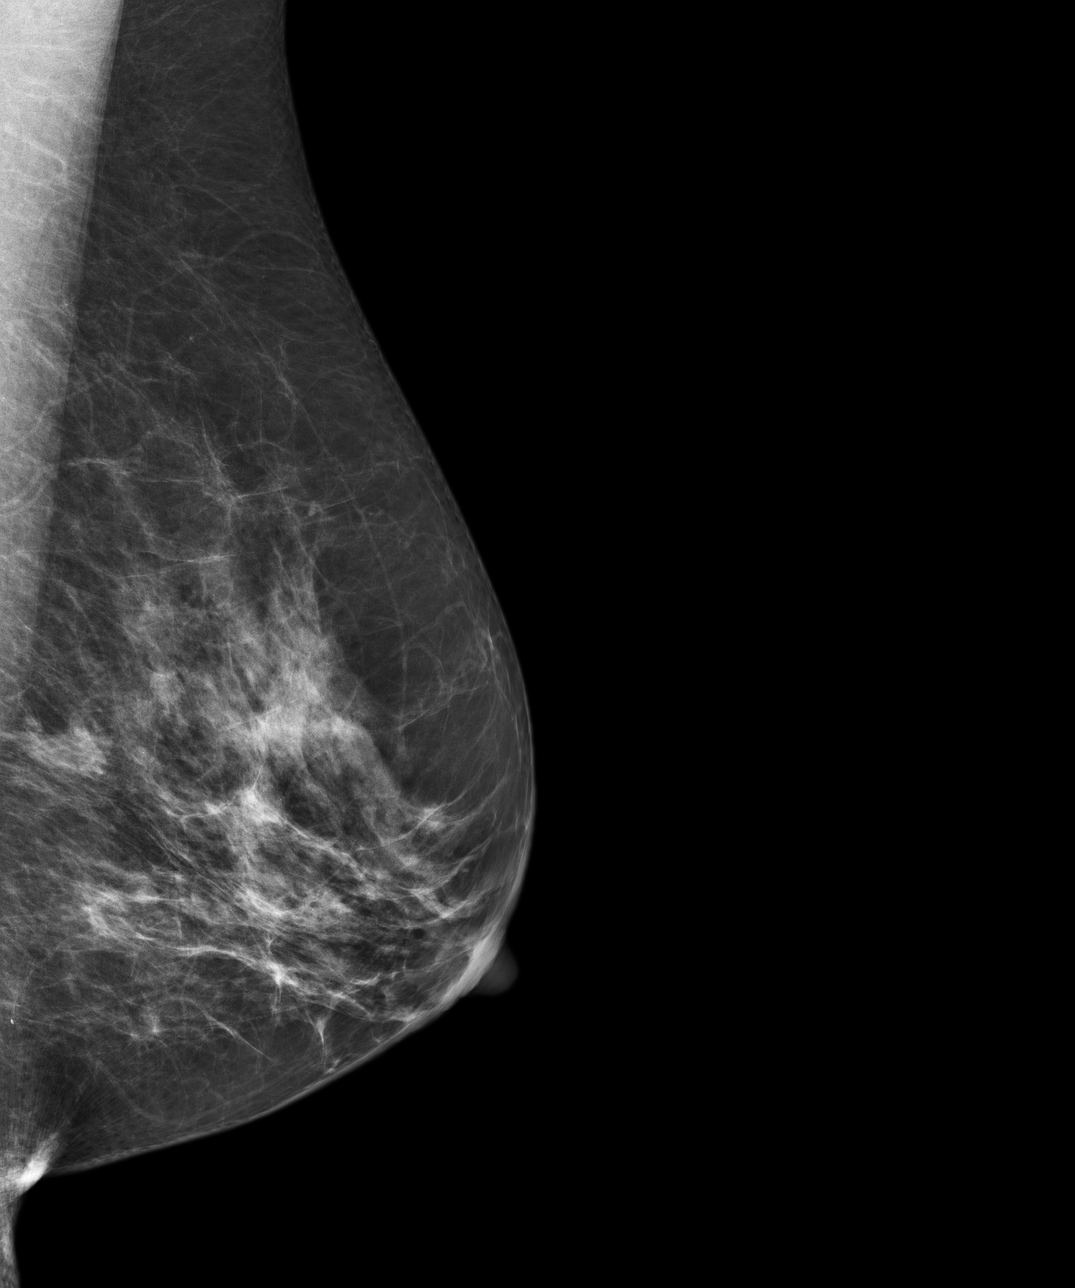

[4 of 4 positions shown; findings below may reference images not displayed]

ACR Breast Density Category c: The breasts are heterogeneously
dense, which may obscure small masses.
FINDINGS: There are no findings suspicious for malignancy. Images were
processed with CAD.
IMPRESSION: No mammographic evidence of malignancy. A result letter of this
screening mammogram will be mailed directly to the patient.

RECOMMENDATION:
Screening mammogram in one year. (Code:[CH])

BI-RADS CATEGORY  1: Negative

## 2013-06-14 ENCOUNTER — Ambulatory Visit: Payer: Self-pay | Admitting: Cardiology

## 2014-01-22 ENCOUNTER — Emergency Department: Payer: Self-pay | Admitting: Emergency Medicine

## 2014-08-10 ENCOUNTER — Ambulatory Visit: Payer: Self-pay | Admitting: Internal Medicine

## 2014-08-10 IMAGING — MG MM DIGITAL SCREENING BILAT W/ CAD
4 series · 4 of 4 positions shown · non-contrast
Comparison: Previous exam(s).

CLINICAL DATA: Screening.

EXAM:
DIGITAL SCREENING BILATERAL MAMMOGRAM WITH CAD

[R CC]
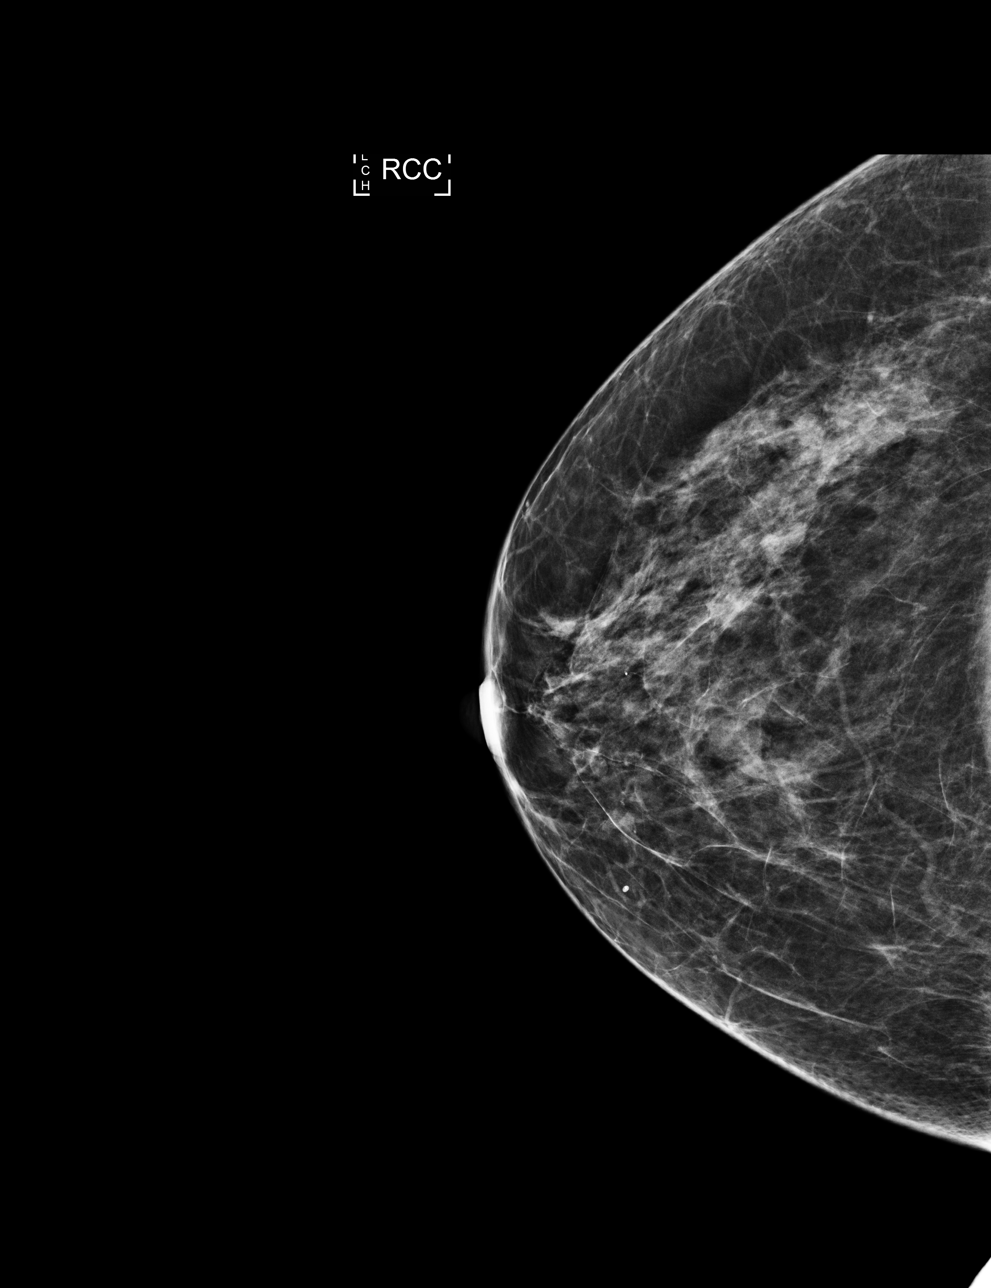

[L MLO]
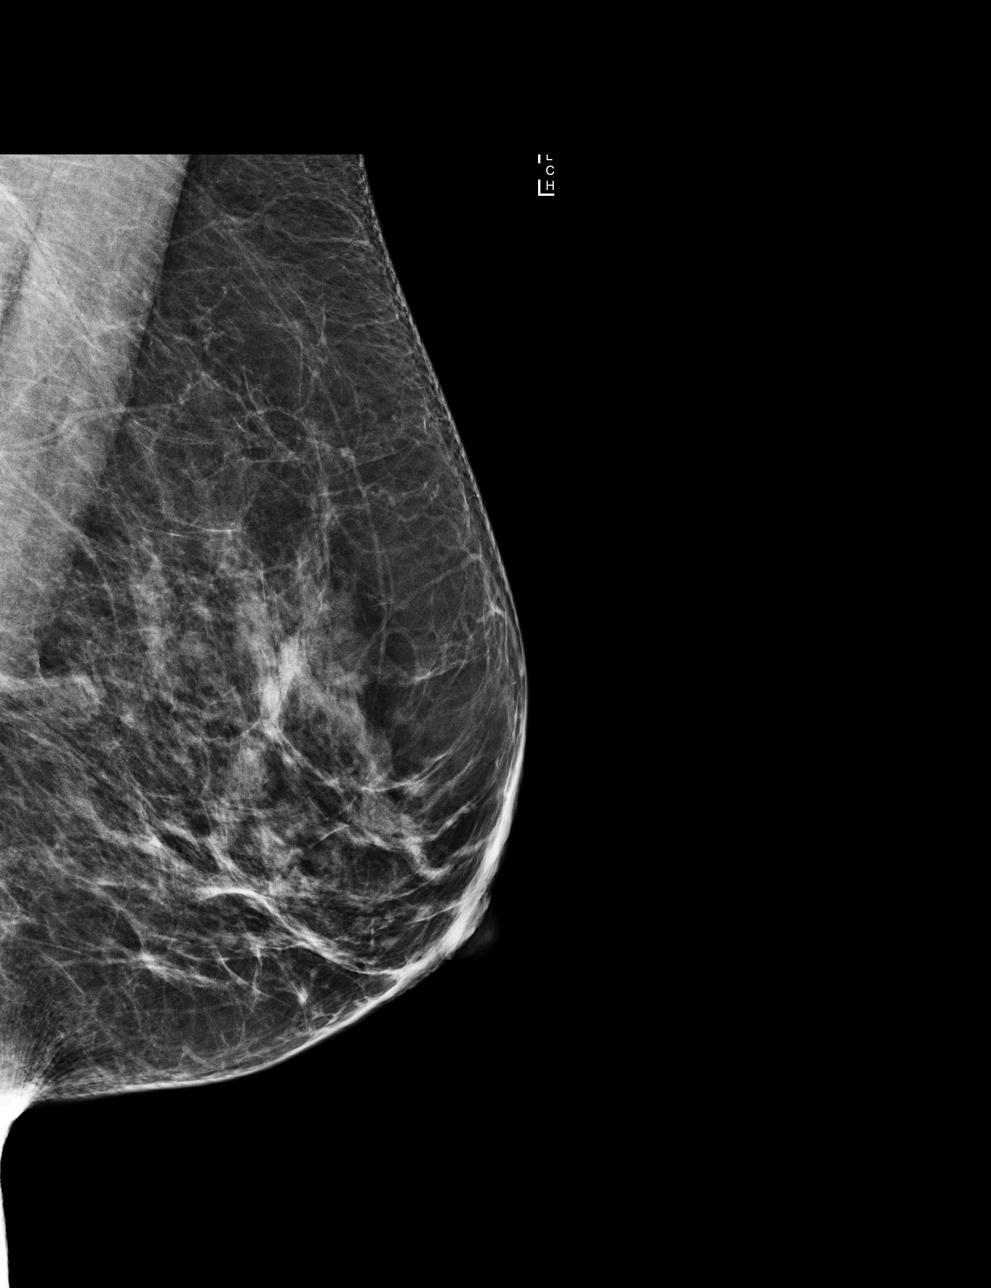

[R MLO]
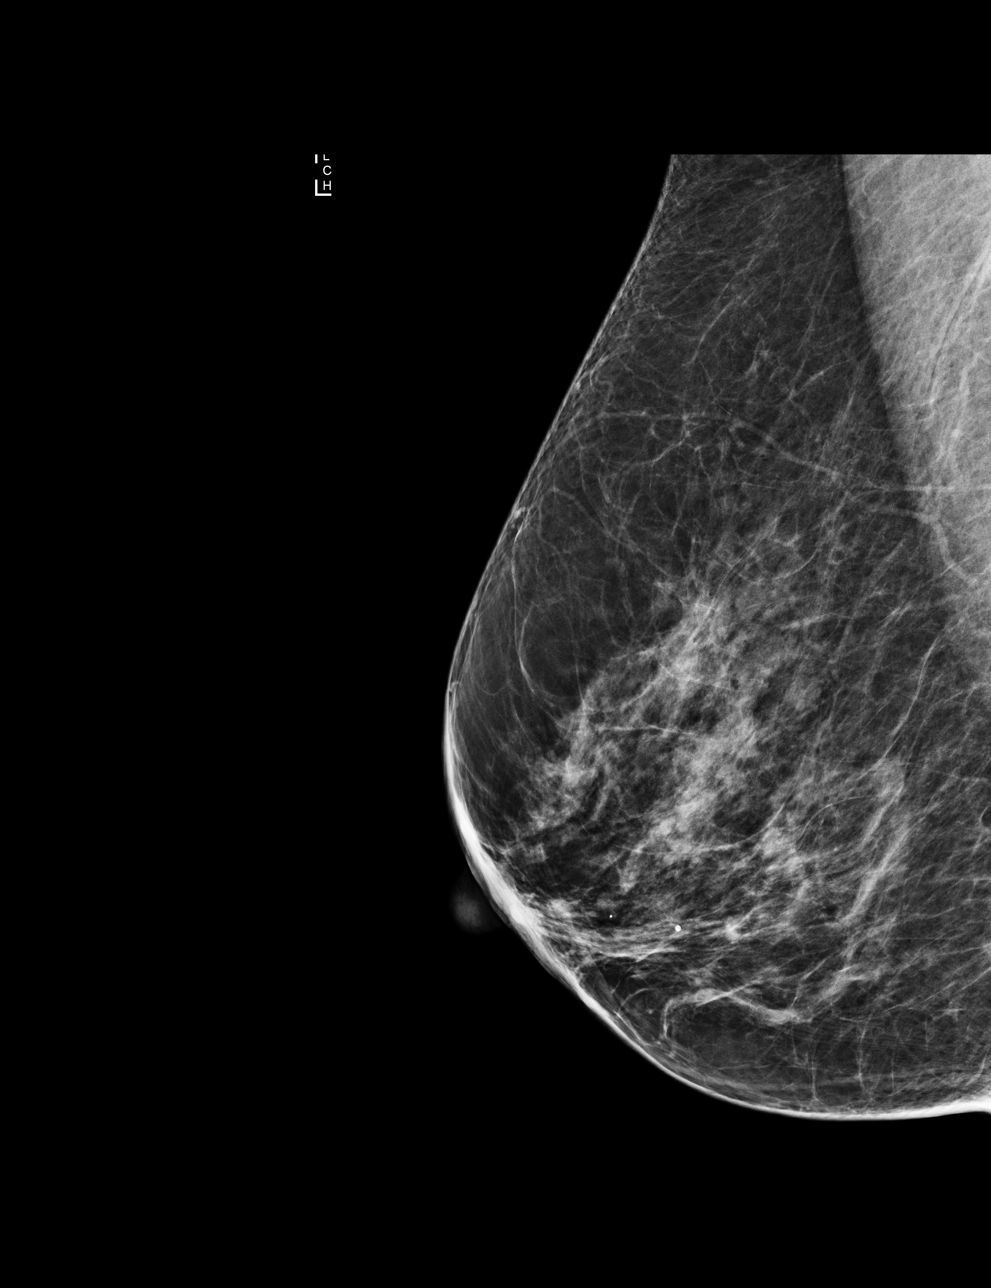

[L CC]
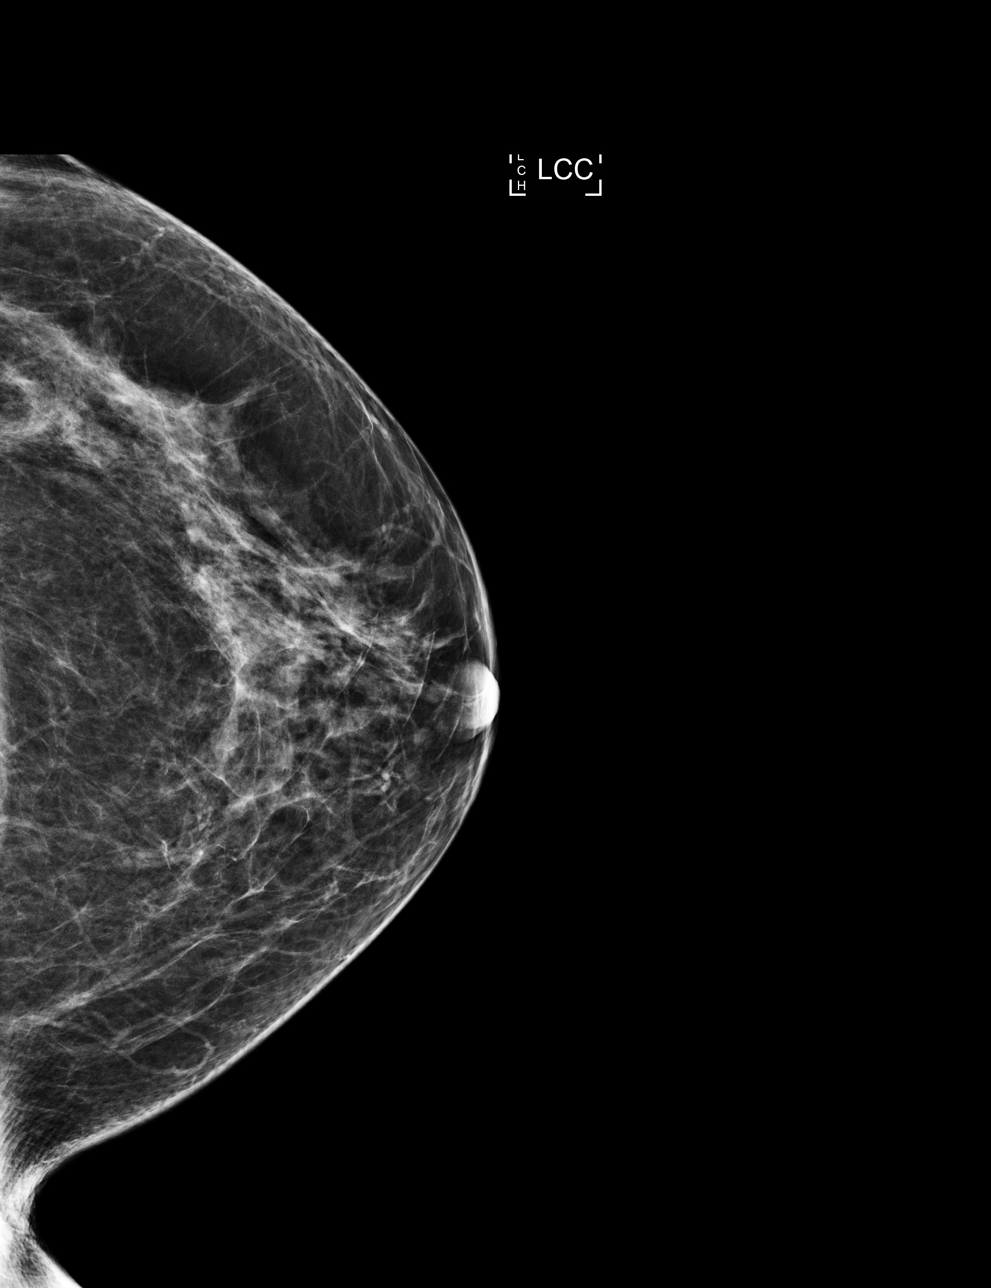

[4 of 4 positions shown; findings below may reference images not displayed]

ACR Breast Density Category b: There are scattered areas of
fibroglandular density.
FINDINGS: There are no findings suspicious for malignancy. Images were
processed with CAD.
IMPRESSION: No mammographic evidence of malignancy. A result letter of this
screening mammogram will be mailed directly to the patient.

RECOMMENDATION:
Screening mammogram in one year. (Code:[US])

BI-RADS CATEGORY  1: Negative.

## 2015-08-30 ENCOUNTER — Other Ambulatory Visit: Payer: Self-pay | Admitting: Internal Medicine

## 2015-08-30 DIAGNOSIS — J431 Panlobular emphysema: Secondary | ICD-10-CM

## 2015-08-30 DIAGNOSIS — Z1231 Encounter for screening mammogram for malignant neoplasm of breast: Secondary | ICD-10-CM

## 2015-08-30 HISTORY — DX: Panlobular emphysema: J43.1

## 2015-09-06 ENCOUNTER — Ambulatory Visit
Admission: RE | Admit: 2015-09-06 | Discharge: 2015-09-06 | Disposition: A | Discharge status: Home / Self Care | Payer: Medicare Other | Source: Ambulatory Visit | Attending: Internal Medicine | Admitting: Internal Medicine

## 2015-09-06 ENCOUNTER — Other Ambulatory Visit: Payer: Self-pay | Admitting: Internal Medicine

## 2015-09-06 DIAGNOSIS — Z1231 Encounter for screening mammogram for malignant neoplasm of breast: Secondary | ICD-10-CM | POA: Diagnosis not present

## 2015-09-06 IMAGING — MG MM DIGITAL SCREENING BILAT W/ TOMO W/ CAD
9 of 12 series · 9 of 28 positions shown · non-contrast
Comparison: Previous exam(s).

CLINICAL DATA: Screening.

EXAM:
DIGITAL SCREENING BILATERAL MAMMOGRAM WITH 3D TOMO WITH CAD

[R CC synth-2D]
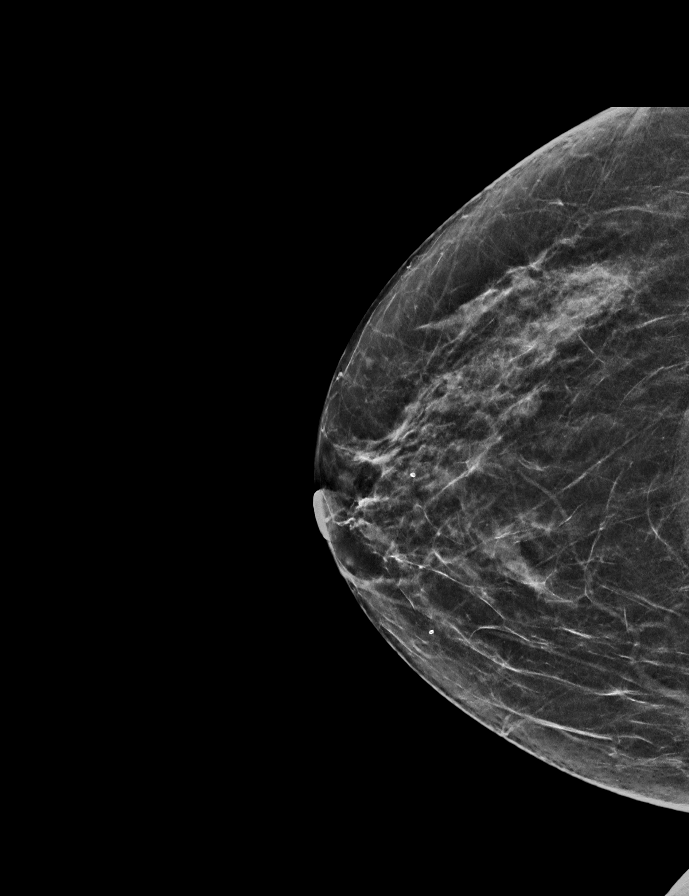

[R MLO synth-2D]
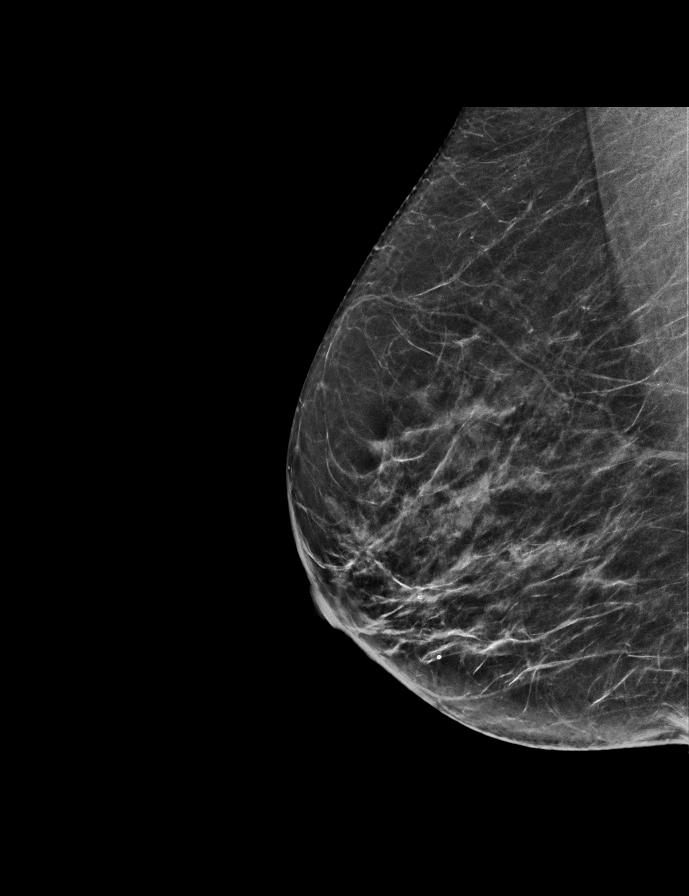

[L CC synth-2D]
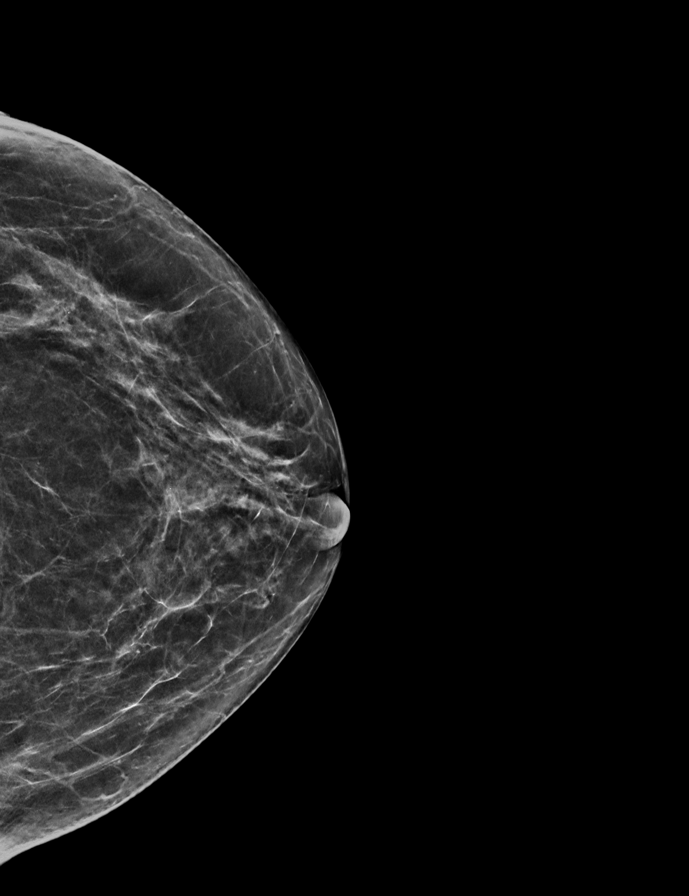

[R MLO]
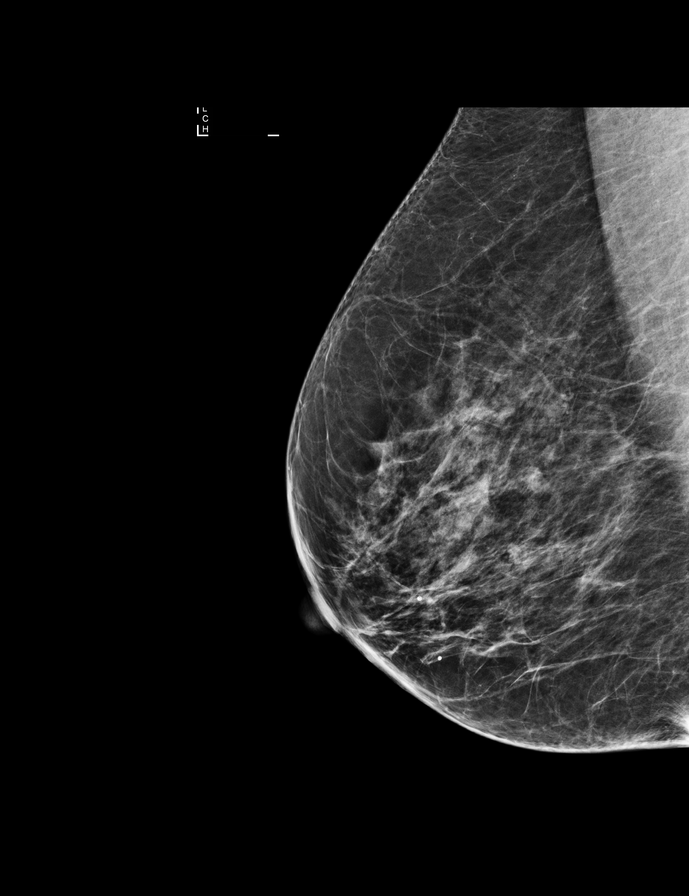

[L MLO synth-2D]
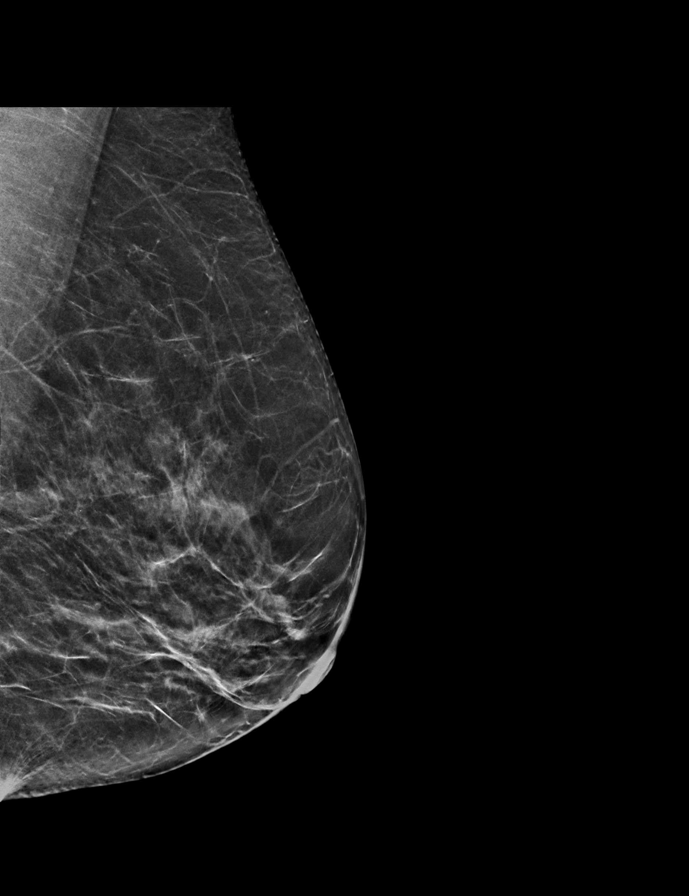

[L MLO]
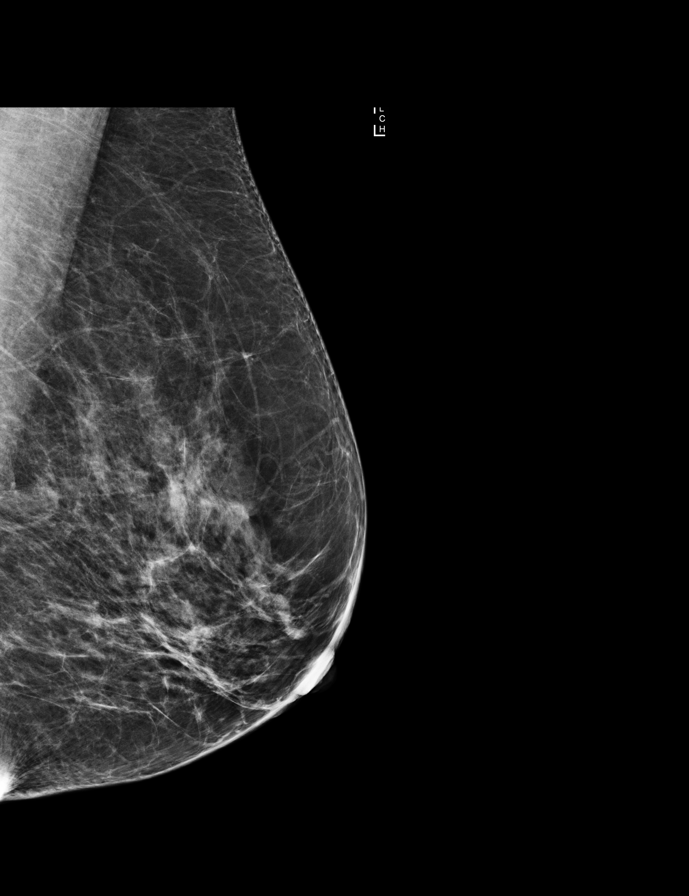

[R CC]
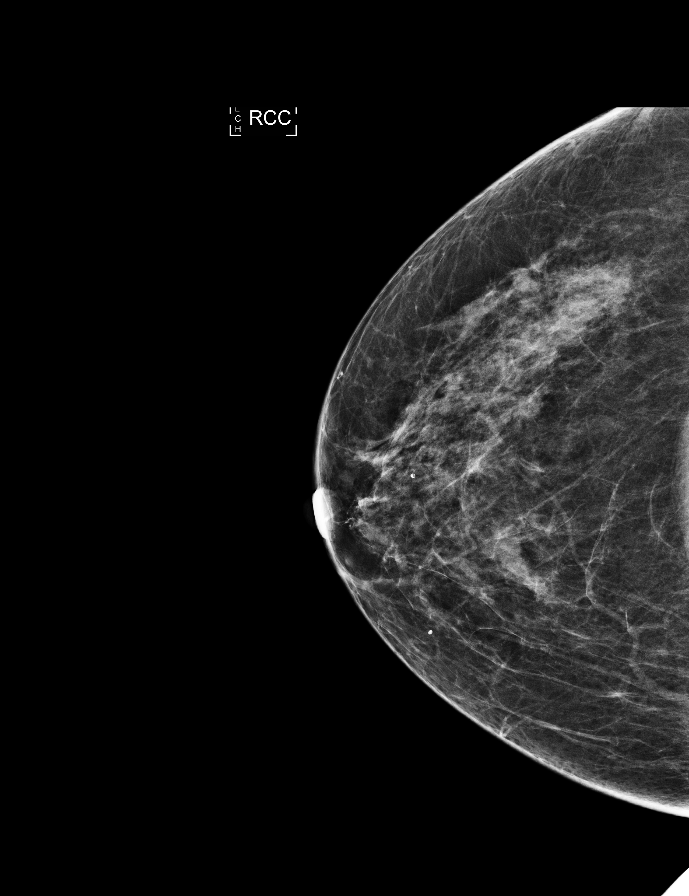

[L CC]
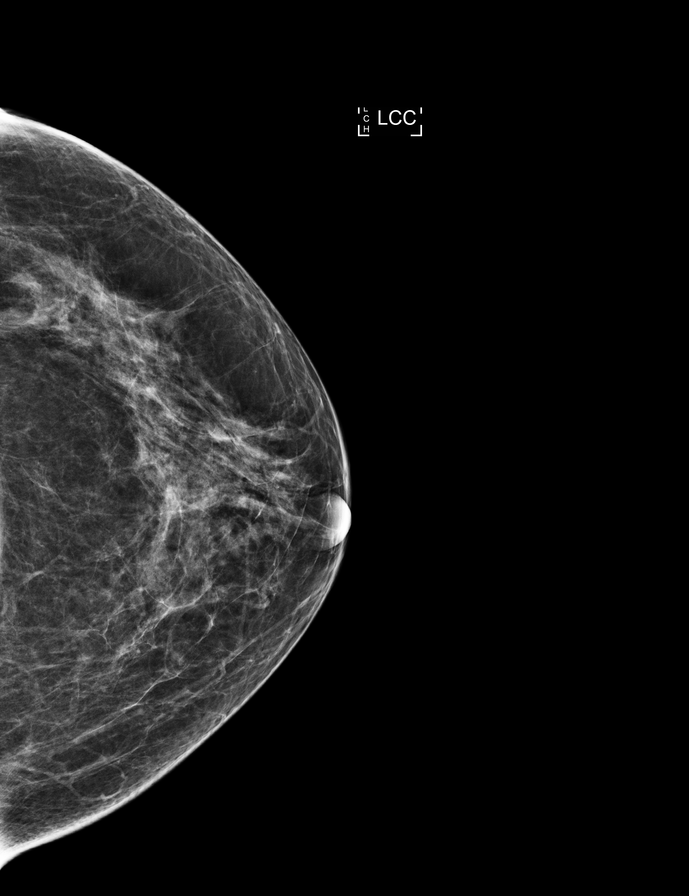

[L MLO tomo · tomo slice 27/54.0]
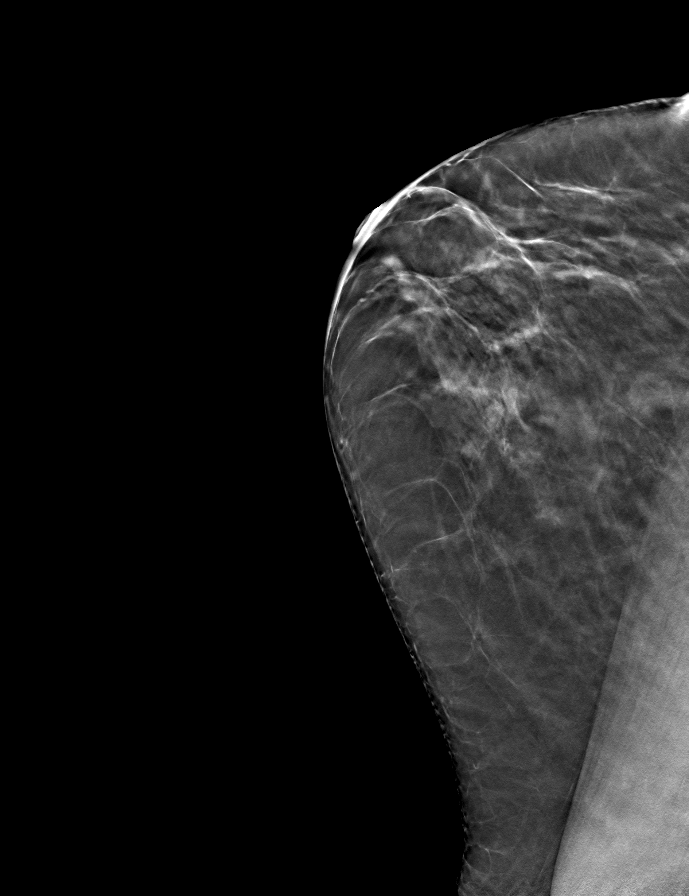

[9 of 28 positions shown; findings below may reference images not displayed]

ACR Breast Density Category c: The breast tissue is heterogeneously
dense, which may obscure small masses.
FINDINGS: There are no findings suspicious for malignancy. Images were
processed with CAD.
IMPRESSION: No mammographic evidence of malignancy. A result letter of this
screening mammogram will be mailed directly to the patient.

RECOMMENDATION:
Screening mammogram in one year. (Code:[SM])

BI-RADS CATEGORY  1: Negative.

## 2016-09-06 ENCOUNTER — Other Ambulatory Visit: Payer: Self-pay | Admitting: Internal Medicine

## 2016-09-06 DIAGNOSIS — Z1231 Encounter for screening mammogram for malignant neoplasm of breast: Secondary | ICD-10-CM

## 2016-10-09 ENCOUNTER — Ambulatory Visit
Admission: RE | Admit: 2016-10-09 | Discharge: 2016-10-09 | Disposition: A | Discharge status: Home / Self Care | Payer: Medicare Other | Source: Ambulatory Visit | Attending: Internal Medicine | Admitting: Internal Medicine

## 2016-10-09 DIAGNOSIS — Z1231 Encounter for screening mammogram for malignant neoplasm of breast: Secondary | ICD-10-CM

## 2016-10-09 IMAGING — MG MM DIGITAL SCREENING BILAT W/ TOMO W/ CAD
8 of 12 series · 8 of 28 positions shown · non-contrast
Comparison: Previous exam(s).

CLINICAL DATA: Screening.

EXAM:
2D DIGITAL SCREENING BILATERAL MAMMOGRAM WITH CAD AND ADJUNCT TOMO

[R MLO synth-2D]
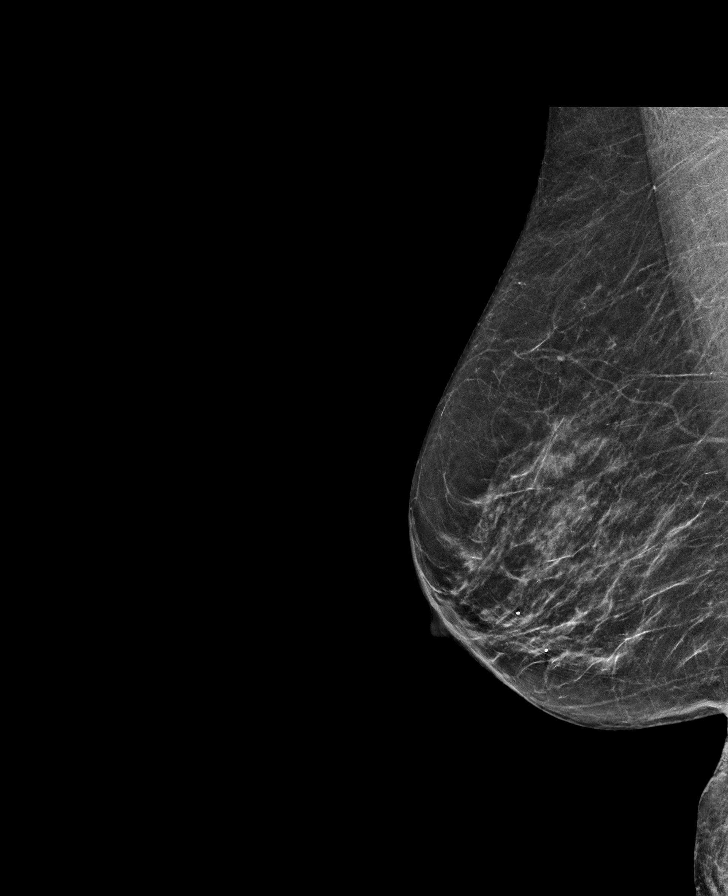

[L MLO]
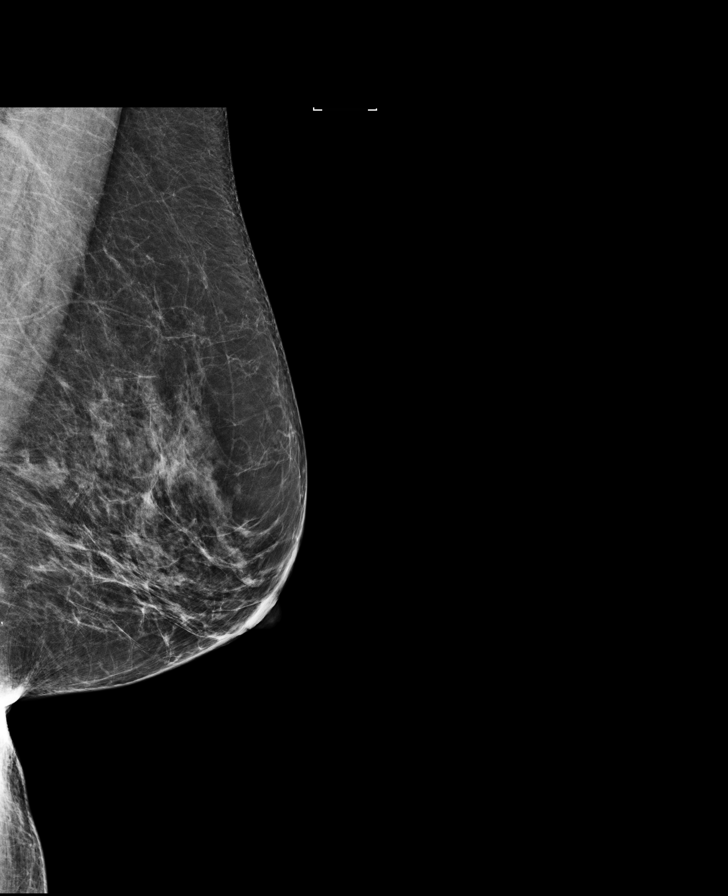

[R MLO]
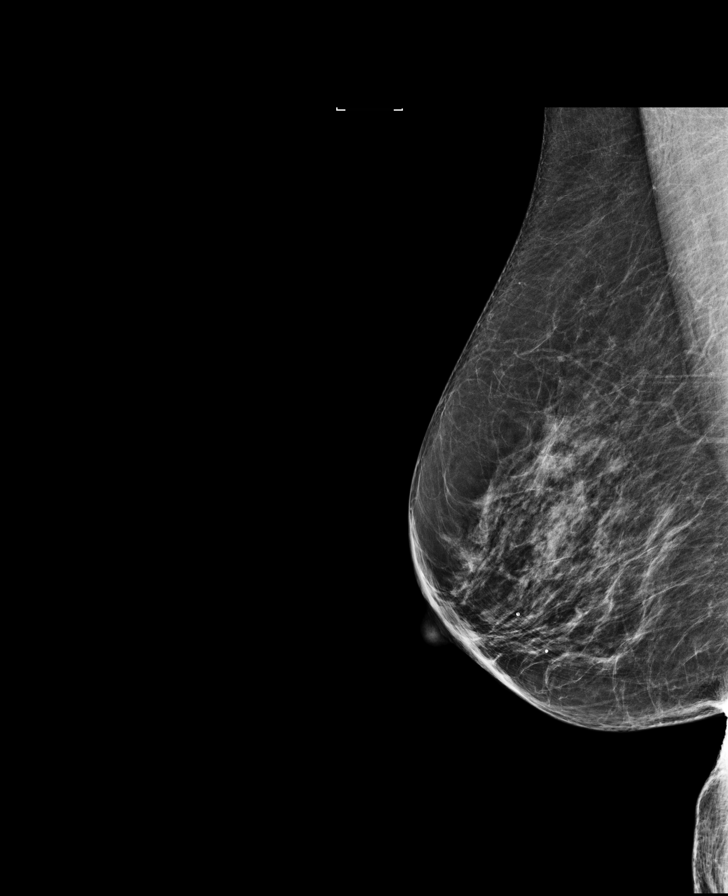

[L MLO synth-2D]
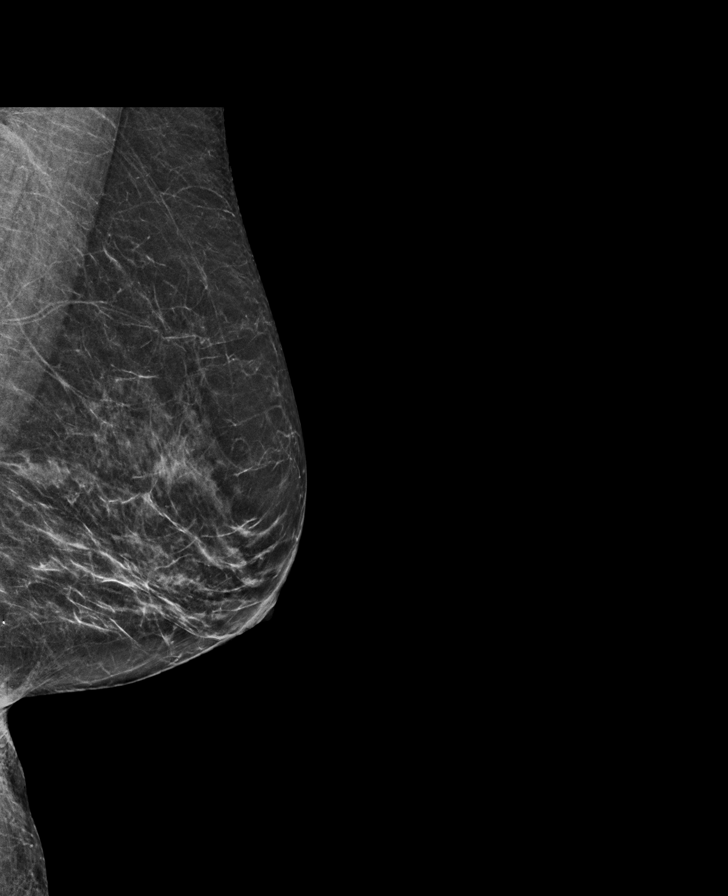

[L CC]
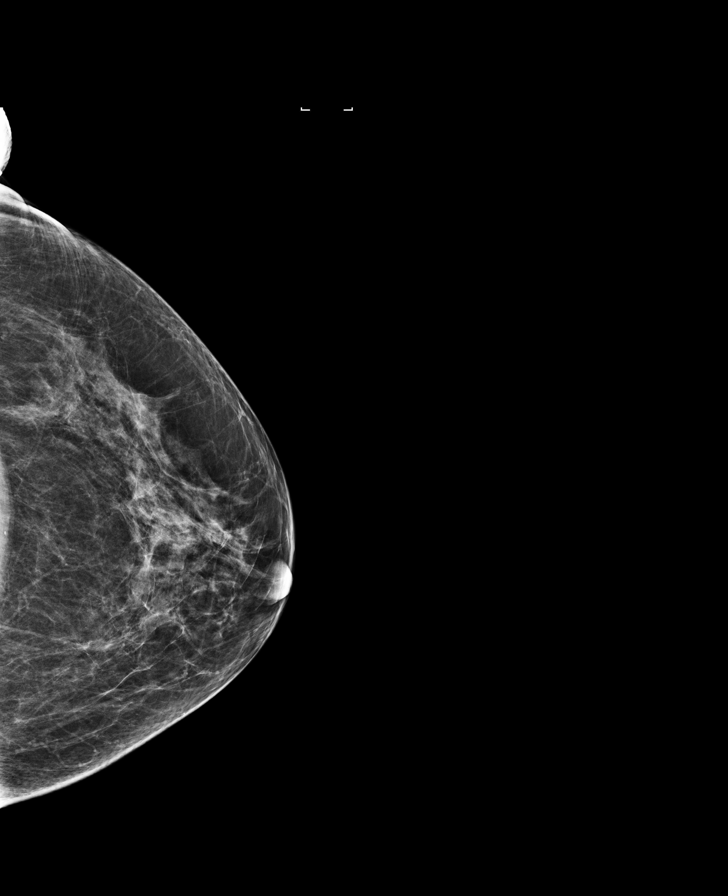

[R CC]
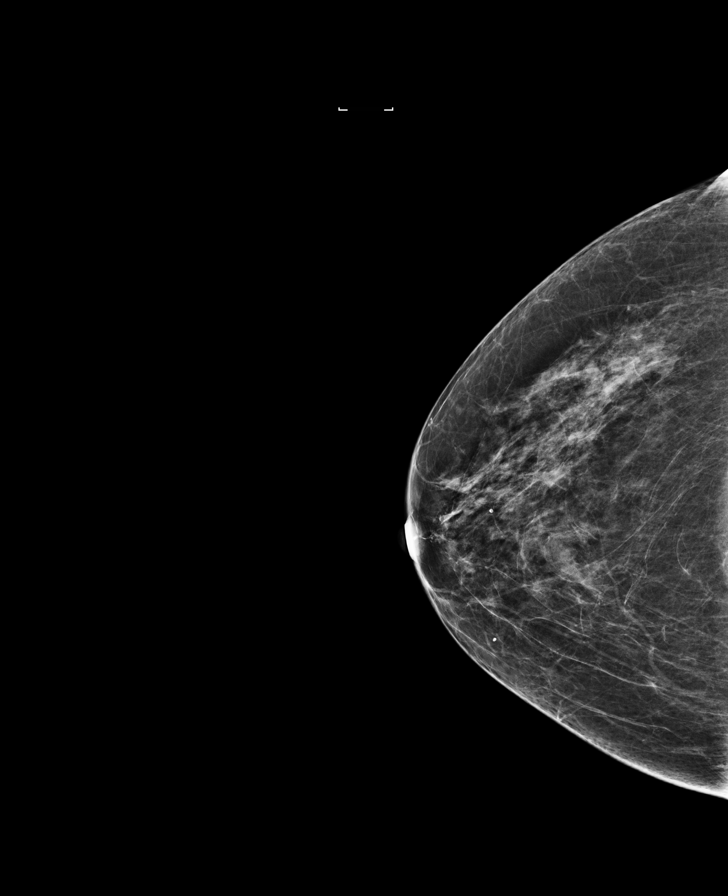

[L CC synth-2D]
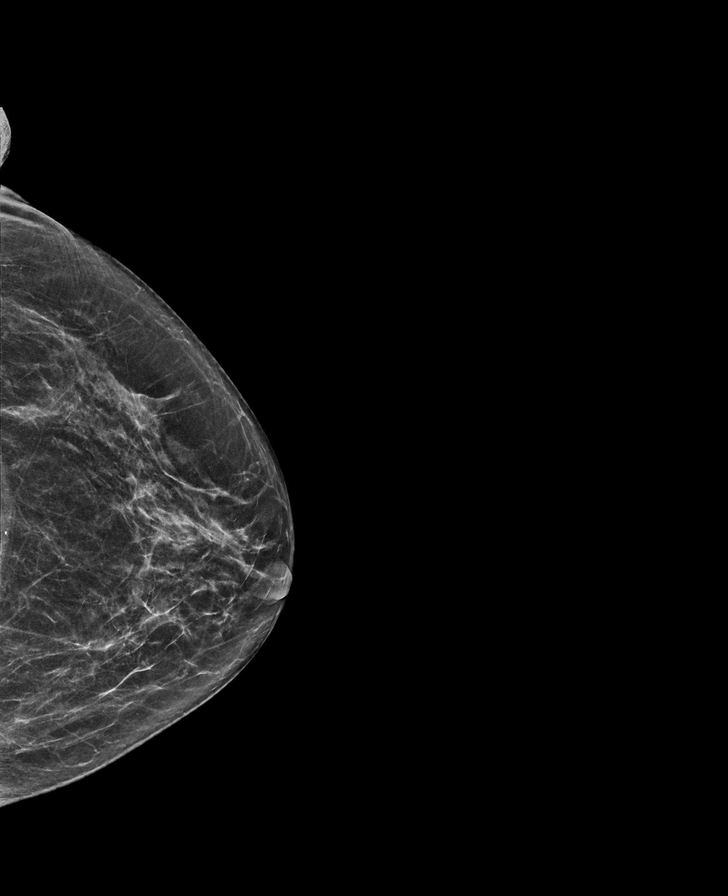

[R CC synth-2D]
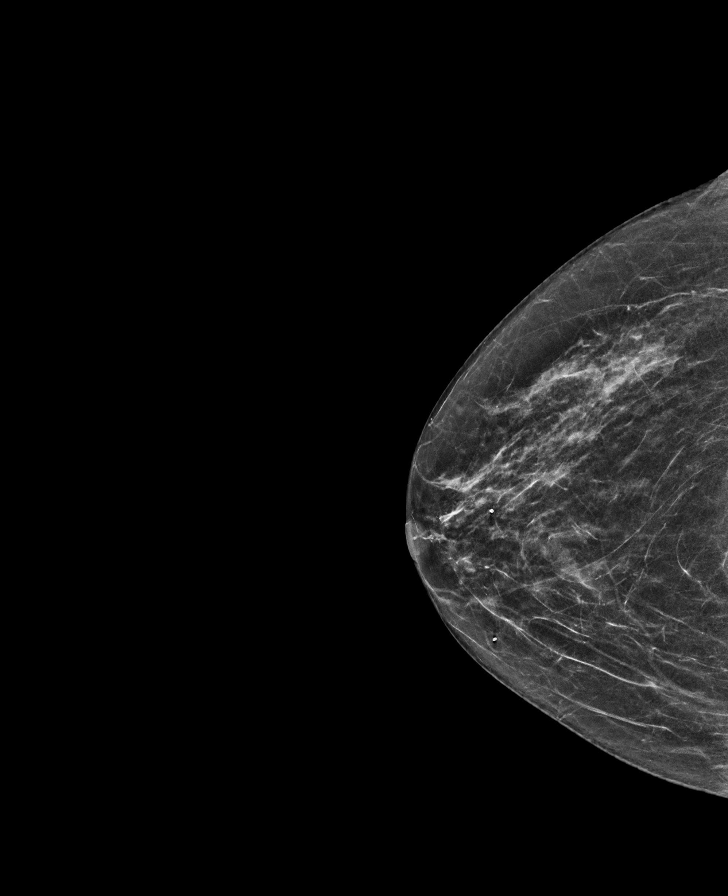

[8 of 28 positions shown; findings below may reference images not displayed]

ACR Breast Density Category b: There are scattered areas of
fibroglandular density.
FINDINGS: There are no findings suspicious for malignancy. Images were
processed with CAD.
IMPRESSION: No mammographic evidence of malignancy. A result letter of this
screening mammogram will be mailed directly to the patient.

RECOMMENDATION:
Screening mammogram in one year. (Code:[33])

BI-RADS CATEGORY  1: Negative.

## 2017-09-05 ENCOUNTER — Other Ambulatory Visit: Payer: Self-pay | Admitting: Internal Medicine

## 2017-09-05 DIAGNOSIS — Z1231 Encounter for screening mammogram for malignant neoplasm of breast: Secondary | ICD-10-CM

## 2017-10-10 ENCOUNTER — Ambulatory Visit
Admission: RE | Admit: 2017-10-10 | Discharge: 2017-10-10 | Disposition: A | Discharge status: Home / Self Care | Payer: Medicare Other | Source: Ambulatory Visit | Attending: Internal Medicine | Admitting: Internal Medicine

## 2017-10-10 DIAGNOSIS — Z1231 Encounter for screening mammogram for malignant neoplasm of breast: Secondary | ICD-10-CM | POA: Diagnosis not present

## 2017-10-10 IMAGING — MG MM DIGITAL SCREENING BILAT W/ TOMO W/ CAD
9 of 13 series · 9 of 29 positions shown · non-contrast
Comparison: Previous exam(s).

CLINICAL DATA: Screening.

EXAM:
2D DIGITAL SCREENING BILATERAL MAMMOGRAM WITH 3D TOMO WITH CAD

[L MLO (1 of 2)]
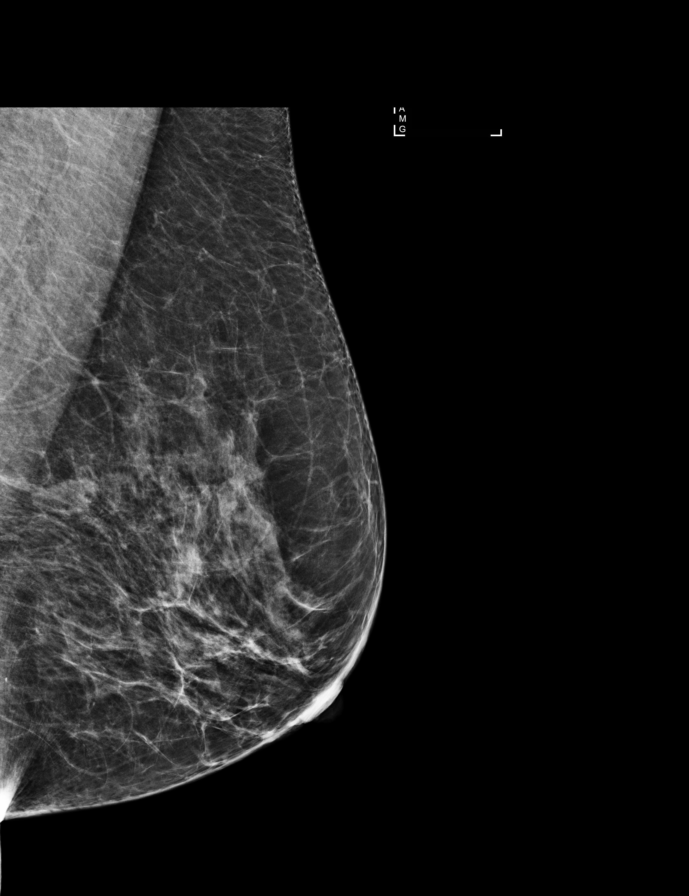

[R CC]
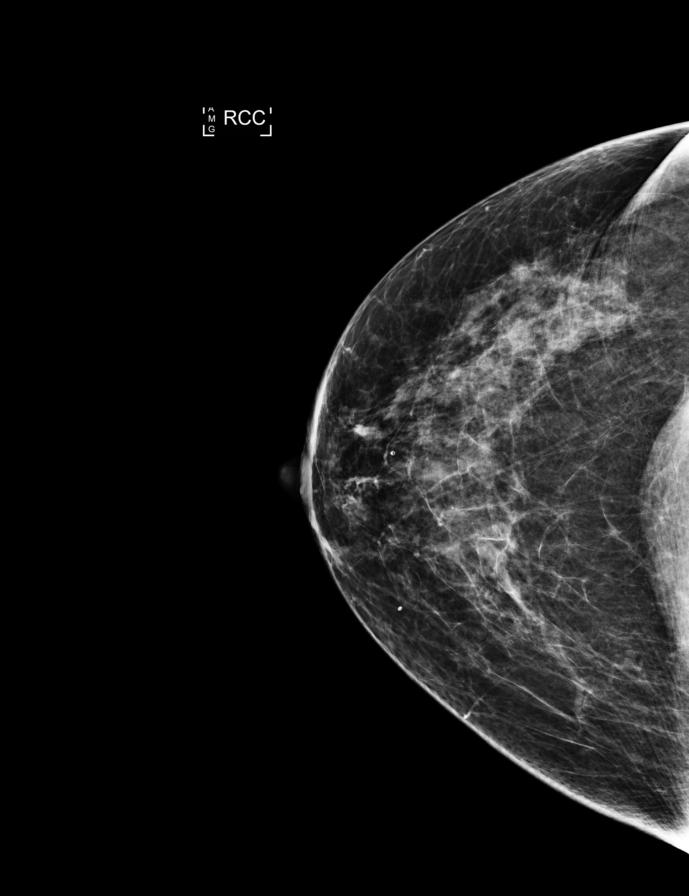

[L MLO synth-2D]
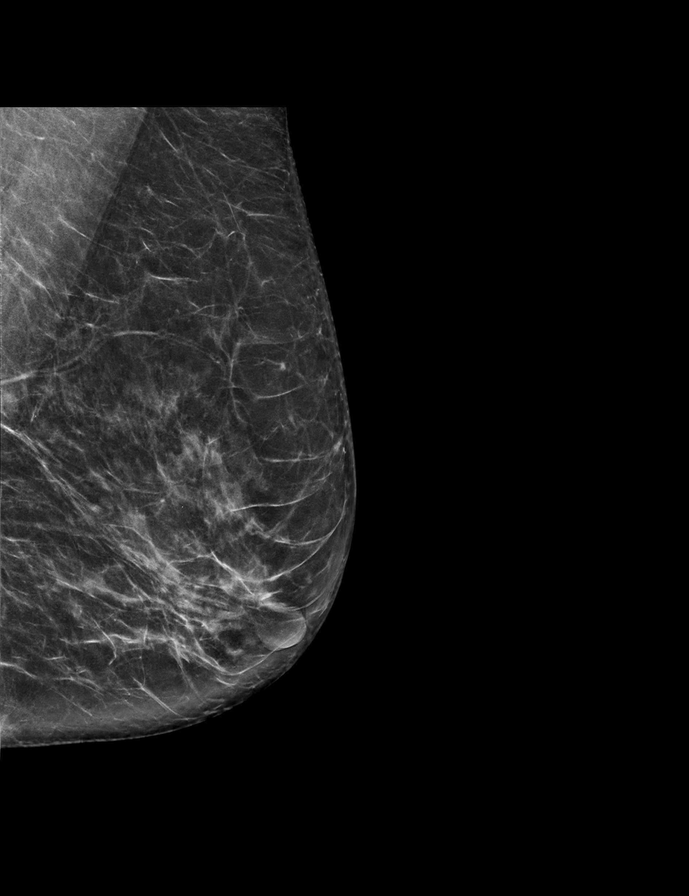

[R MLO]
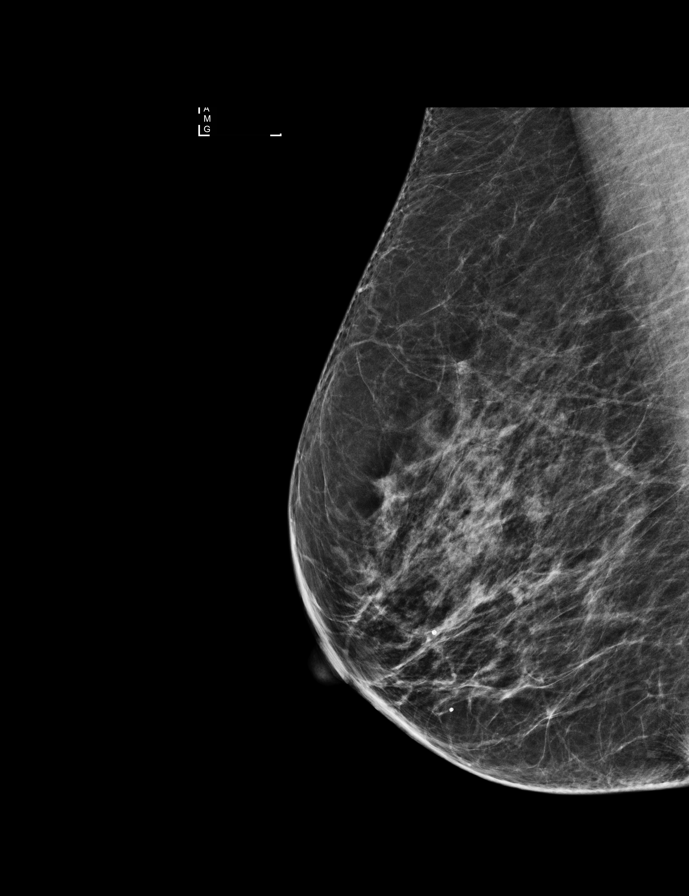

[R MLO synth-2D]
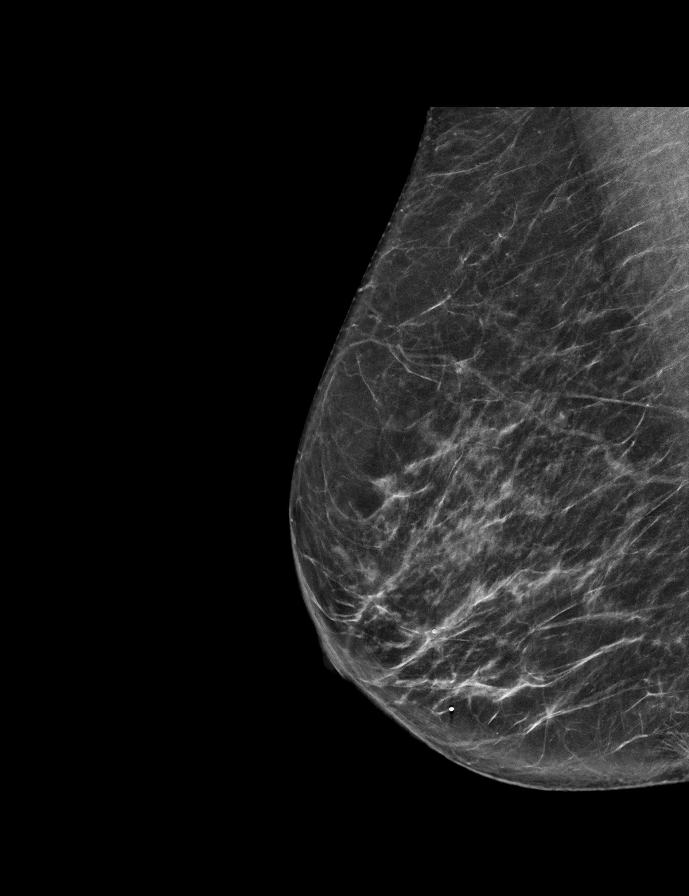

[L CC]
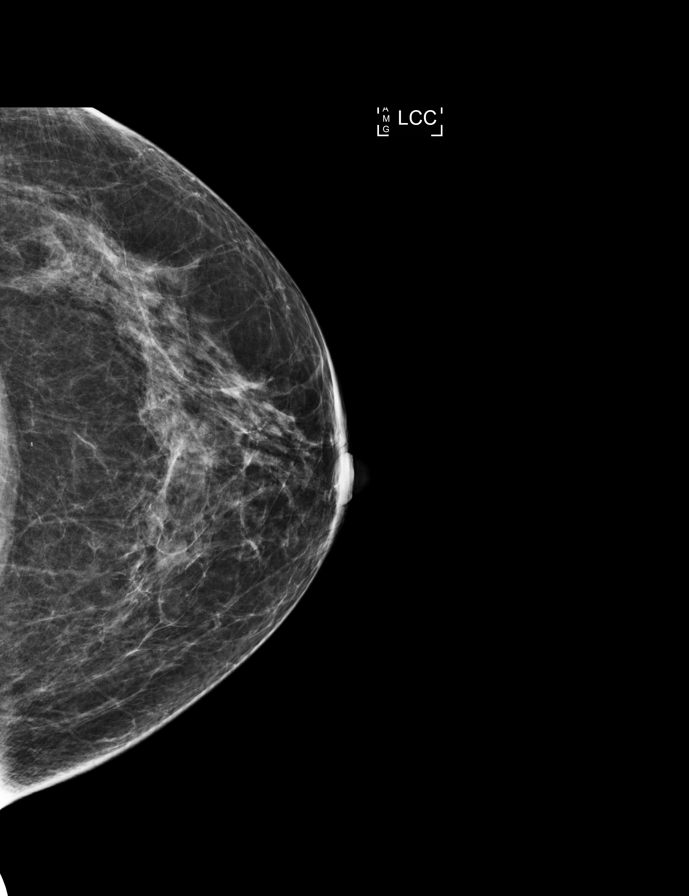

[R CC synth-2D]
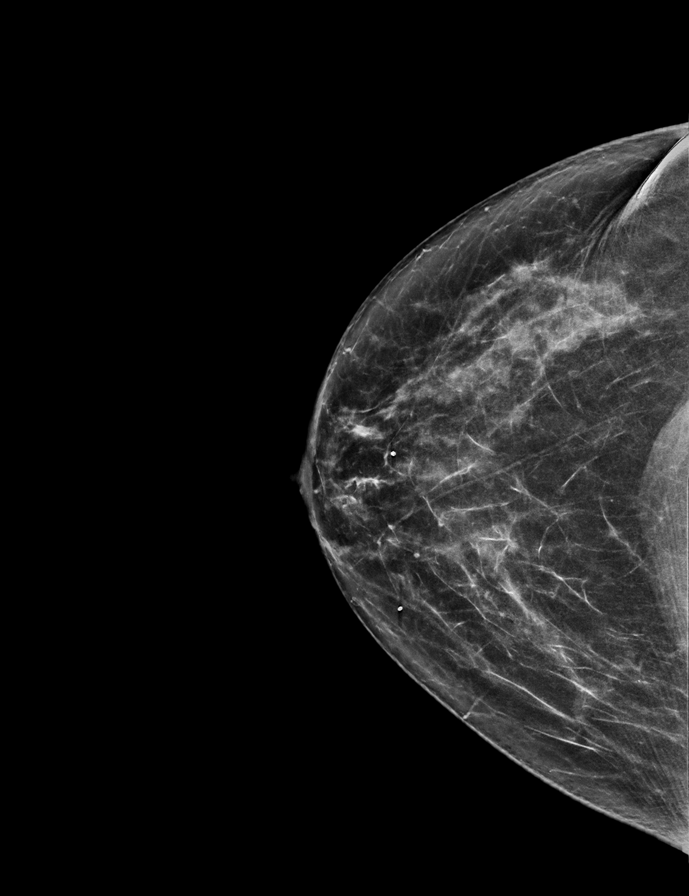

[L CC synth-2D]
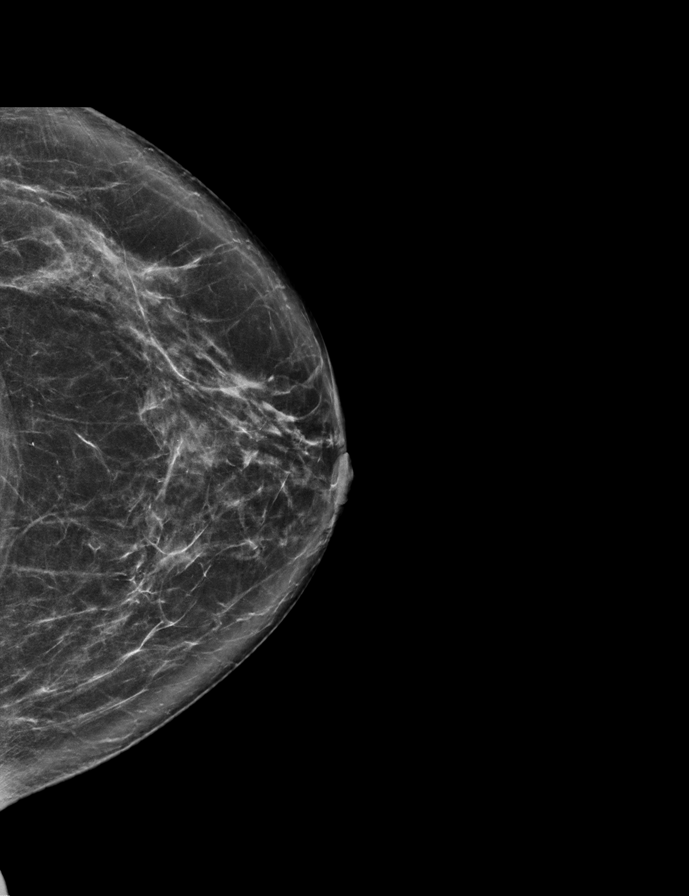

[L MLO (2 of 2)]
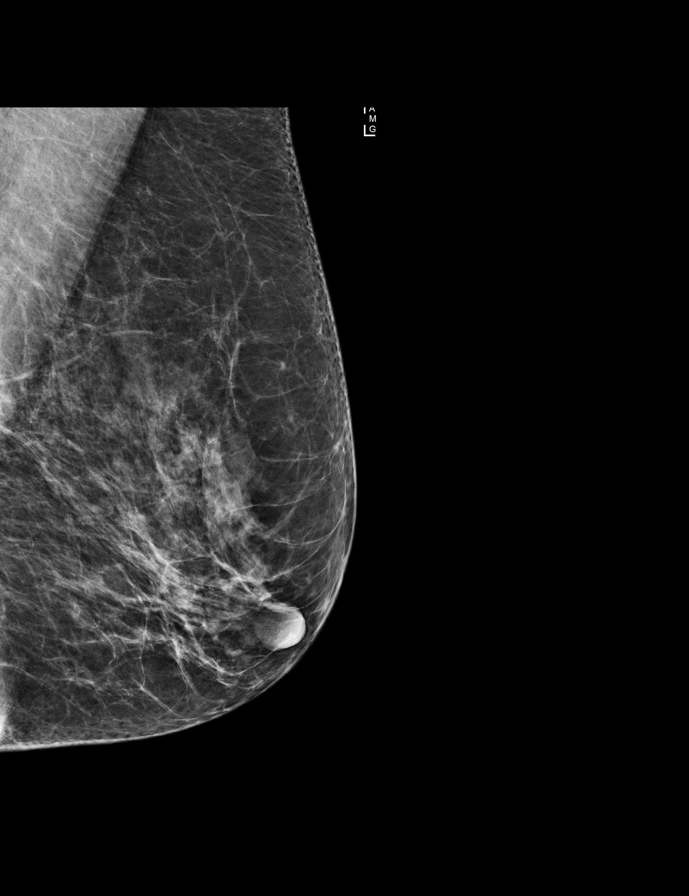

[9 of 29 positions shown; findings below may reference images not displayed]

ACR Breast Density Category c: The breast tissue is heterogeneously
dense, which may obscure small masses.
FINDINGS: There are no findings suspicious for malignancy. Images were
processed with CAD.
IMPRESSION: No mammographic evidence of malignancy. A result letter of this
screening mammogram will be mailed directly to the patient.

RECOMMENDATION:
Screening mammogram in one year. (Code:[4B])

BI-RADS CATEGORY  1: Negative.

## 2018-09-09 ENCOUNTER — Encounter: Payer: Self-pay | Admitting: Emergency Medicine

## 2018-09-09 ENCOUNTER — Emergency Department
Admission: EM | Admit: 2018-09-09 | Discharge: 2018-09-09 | Disposition: A | Discharge status: Home / Self Care | Payer: Medicare Other | Attending: Emergency Medicine | Admitting: Emergency Medicine

## 2018-09-09 DIAGNOSIS — S61451A Open bite of right hand, initial encounter: Secondary | ICD-10-CM

## 2018-09-09 DIAGNOSIS — Z79899 Other long term (current) drug therapy: Secondary | ICD-10-CM | POA: Diagnosis not present

## 2018-09-09 DIAGNOSIS — Y92019 Unspecified place in single-family (private) house as the place of occurrence of the external cause: Secondary | ICD-10-CM | POA: Insufficient documentation

## 2018-09-09 DIAGNOSIS — W540XXA Bitten by dog, initial encounter: Secondary | ICD-10-CM | POA: Diagnosis not present

## 2018-09-09 DIAGNOSIS — Z7982 Long term (current) use of aspirin: Secondary | ICD-10-CM | POA: Insufficient documentation

## 2018-09-09 DIAGNOSIS — Y998 Other external cause status: Secondary | ICD-10-CM | POA: Insufficient documentation

## 2018-09-09 DIAGNOSIS — Z23 Encounter for immunization: Secondary | ICD-10-CM | POA: Insufficient documentation

## 2018-09-09 DIAGNOSIS — S6991XA Unspecified injury of right wrist, hand and finger(s), initial encounter: Secondary | ICD-10-CM | POA: Diagnosis present

## 2018-09-09 DIAGNOSIS — Y9389 Activity, other specified: Secondary | ICD-10-CM | POA: Diagnosis not present

## 2018-09-09 MED ORDER — TETANUS-DIPHTH-ACELL PERTUSSIS 5-2.5-18.5 LF-MCG/0.5 IM SUSP
0.5000 mL | Freq: Once | INTRAMUSCULAR | Status: AC
  Administered 2018-09-09: 0.5 mL via INTRAMUSCULAR
  Filled 2018-09-09: qty 0.5

## 2018-09-09 MED ORDER — ALPRAZOLAM 0.5 MG PO TABS
0.5000 mg | ORAL_TABLET | Freq: Once | ORAL | Status: AC
  Administered 2018-09-09: 0.5 mg via ORAL
  Filled 2018-09-09: qty 1

## 2018-09-09 MED ORDER — AMOXICILLIN-POT CLAVULANATE 875-125 MG PO TABS: 1.0000 | ORAL_TABLET | Freq: Two times a day (BID) | ORAL | 0 refills | Status: DC

## 2018-09-09 NOTE — ED Provider Notes (Signed)
[facility] Emergency Department Provider Note  ____________________________________________  Time seen: Approximately 3:49 PM  I have reviewed the triage vital signs and the nursing notes.   HISTORY  Chief Complaint Animal Bite    HPI Jenny White is a 73 y.o. female who presents the emergency department complaining of dog bite to the fourth and fifth digits.  Patient reports that her dog had a bone in his mouth which she did not see, she reached down to touch him and he turned around and snapped at her.  Patient reports that when she felt the teeth pain contact with her hand, she stood up and pulled her hand away causing lacerations to the fourth and fifth digit.  These injuries were thoroughly cleansed last night using soap, water, hydrogen peroxide and bandaged.  When changing bandages today, laceration started to dehisce and she presents the emergency department for evaluation.  She is unsure of her last tetanus shot.  She has full range of motion to the digits.  No other injury or complaint.  The dog's immunizations are up-to-date.  Incident was reported to [employer].    History reviewed. No pertinent past medical history.  There are no active problems to display for this patient.   Past Surgical History:  Procedure Laterality Date  . BREAST BIOPSY Left 1970's    Prior to Admission medications   Medication Sig Start Date End Date Taking? Authorizing Provider  ALPRAZolam Prudy Feeler) 0.5 MG tablet Take 0.5 mg by mouth at bedtime as needed for anxiety.   Yes [provider]  aspirin 81 MG chewable tablet Chew 81 mg by mouth daily.   Yes [provider]  lisinopril-hydrochlorothiazide (PRINZIDE,ZESTORETIC) 10-12.5 MG tablet Take 1 tablet by mouth daily.   Yes [provider]  pantoprazole (PROTONIX) 40 MG tablet Take 40 mg by mouth daily.   Yes [provider]  tiotropium (SPIRIVA) 18 MCG inhalation capsule  Place 18 mcg into inhaler and inhale daily.   Yes [provider]  amoxicillin-clavulanate (AUGMENTIN) 875-125 MG tablet Take 1 tablet by mouth 2 (two) times daily. 09/09/18   , Delorise Royals, PA-C    Allergies Patient has no known allergies.  Family History  Problem Relation Age of Onset  . Breast cancer Sister 54    Social History Social History   Tobacco Use  . Smoking status: Never Smoker  . Smokeless tobacco: Never Used  Substance Use Topics  . Alcohol use: Not on file  . Drug use: Not on file     Review of Systems  Constitutional: No fever/chills Eyes: No visual changes.  Cardiovascular: no chest pain. Respiratory: no cough. No SOB. Gastrointestinal: No abdominal pain.  No nausea, no vomiting.   Musculoskeletal: Accidental dog bite to the fourth and fifth digit of the right hand Skin: Negative for rash, abrasions, lacerations, ecchymosis. Neurological: Negative for headaches, focal weakness or numbness. 10-point ROS otherwise negative.  ____________________________________________   PHYSICAL EXAM:  VITAL SIGNS: ED Triage Vitals  Enc Vitals Group     BP 09/09/18 1433 (!) 168/70     Pulse Rate 09/09/18 1433 94     Resp 09/09/18 1433 16     Temp 09/09/18 1433 97.8 F (36.6 C)     Temp Source 09/09/18 1433 Oral     SpO2 09/09/18 1433 97 %     Weight 09/09/18 1434 140 lb (63.5 kg)     Height 09/09/18 1434 5\' 8"  (1.727 m)  Head Circumference --      Peak Flow --      Pain Score 09/09/18 1437 4     Pain Loc --      Pain Edu? --      Excl. in GC? --      Constitutional: Alert and oriented. Well appearing and in no acute distress. Eyes: Conjunctivae are normal. PERRL. EOMI. Head: Atraumatic. Neck: No stridor.    Cardiovascular: Normal rate, regular rhythm. Normal S1 and S2.  Good peripheral circulation. Respiratory: Normal respiratory effort without tachypnea or retractions. Lungs CTAB. Good air entry to the bases with no decreased or  absent breath sounds. Musculoskeletal: Full range of motion to all extremities. No gross deformities appreciated.  Visualization of the fourth and fifth digit reveals multiple lacerations and abrasions consistent with dog bite to the fourth and fifth digit.  No laceration is gaped open.  Most are in the processes of scabbing.  No active bleeding.  No foreign body.  Patient has full range of motion to the digits.  Patient is tender over the soft tissue injury but no appreciable osseous tenderness to palpation.  Sensation and cap refill intact to both digits. Neurologic:  Normal speech and language. No gross focal neurologic deficits are appreciated.  Skin:  Skin is warm, dry and intact. No rash noted. Psychiatric: Mood and affect are normal. Speech and behavior are normal. Patient exhibits appropriate insight and judgement.   ____________________________________________   LABS (all labs ordered are listed, but only abnormal results are displayed)  Labs Reviewed - No data to display ____________________________________________  EKG   ____________________________________________  RADIOLOGY   No results found.  ____________________________________________    PROCEDURES  Procedure(s) performed:    Marland Kitchen.Marland Kitchen.Laceration Repair Date/Time: 09/09/2018 3:54 PM Performed by: Racheal Patchesuthriell,  D, PA-C Authorized by: Racheal Patchesuthriell,  D, PA-C   Consent:    Consent obtained:  Verbal   Consent given by:  Patient   Risks discussed:  Pain, poor cosmetic result, poor wound healing and infection Anesthesia (see MAR for exact dosages):    Anesthesia method:  None Laceration details:    Location:  Finger   Finger location: R ring and small finger. Repair type:    Repair type:  Simple Exploration:    Hemostasis obtained with: Hemostasis prior to arrival.   Wound exploration: wound explored through full range of motion and entire depth of wound probed and visualized     Wound extent: no  foreign bodies/material noted, no muscle damage noted, no nerve damage noted, no tendon damage noted, no underlying fracture noted and no vascular damage noted   Treatment:    Area cleansed with:  Betadine   Amount of cleaning:  Extensive   Irrigation solution: Betadine and saline. Approximation:    Approximation:  Loose Post-procedure details:    Dressing:  Non-adherent dressing and splint for protection   Patient tolerance of procedure:  Tolerated well, no immediate complications Comments:     Patient with multiple lacerations and abrasions to the fourth and fifth digit from accidental dog bite.  Overall, exam is reassuring with full range of motion, sensation and cap refill intact.  Area is thoroughly cleansed.  Given nature of injury with dog bite, no attempts at closure, in addition, injury occurred 20 hours prior to arrival and edges of lacerations already show signs of mild tissue death.  As such, areas are covered using nonadherent dressing, splint for protection.      Medications  Tdap (BOOSTRIX) injection 0.5 mL (  has no administration in time range)  ALPRAZolam Prudy Feeler) tablet 0.5 mg (0.5 mg Oral Given 09/09/18 1521)     ____________________________________________   INITIAL IMPRESSION / ASSESSMENT AND PLAN / ED COURSE  Pertinent labs & imaging results that were available during my care of the patient were reviewed by me and considered in my medical decision making (see chart for details).  Review of the [place] CSRS was performed in accordance of the [facility] prior to dispensing any controlled drugs.      Patient's diagnosis is consistent with dog bite to the hand.  Patient presents the emergency department with complaint of dog bite to the fourth and fifth digit.  This occurred by the patient's own dog.  Accidental in nature.  Animal is up-to-date on all immunizations, was acting appropriately.  No concern for rabies.  Unsure last tetanus shot so this was updated tonight.  Patient  is going to be placed on Augmentin prophylactically given nature of injury.  Patient has a routine appointment in 2 days with her primary care and will follow-up with them for reevaluation of wound..  Patient is given ED precautions to return to the ED for any worsening or new symptoms.     ____________________________________________  FINAL CLINICAL IMPRESSION(S) / ED DIAGNOSES  Final diagnoses:  Dog bite of right hand, initial encounter      NEW MEDICATIONS STARTED DURING THIS VISIT:  ED Discharge Orders         Ordered    amoxicillin-clavulanate (AUGMENTIN) 875-125 MG tablet  2 times daily     09/09/18 1624              This chart was dictated using voice recognition software/Dragon. Despite best efforts to proofread, errors can occur which can change the meaning. Any change was purely unintentional.    Racheal Patches, PA-C 09/09/18 1627    Minna Antis, MD 09/09/18 2203

## 2018-09-09 NOTE — ED Notes (Signed)
See triage note  Presents s/p dog bite  Last pm  States she reached down to the dog and he had a bone  He snapped at her hand  Avulsions and punctures wounds noted to right 4 th and 5th fingers

## 2018-09-09 NOTE — ED Triage Notes (Signed)
Patient presents to the ED with a dog bite to her 4th and 5th fingers of her right hand.  Patient states her beagle had a bone and she didn't realize it and she got her hand near him and he bit her.  Patient is in no obvious distress at this time.  Fingers are bandaged at this time.  Patient states her dog's shots are up to date.  [place]  [place] [facility].

## 2018-09-14 ENCOUNTER — Other Ambulatory Visit: Payer: Self-pay | Admitting: Internal Medicine

## 2018-09-14 DIAGNOSIS — Z1231 Encounter for screening mammogram for malignant neoplasm of breast: Secondary | ICD-10-CM

## 2018-10-16 ENCOUNTER — Ambulatory Visit
Admission: RE | Admit: 2018-10-16 | Discharge: 2018-10-16 | Disposition: A | Discharge status: Home / Self Care | Payer: Medicare Other | Source: Ambulatory Visit | Attending: Internal Medicine | Admitting: Internal Medicine

## 2018-10-16 DIAGNOSIS — Z1231 Encounter for screening mammogram for malignant neoplasm of breast: Secondary | ICD-10-CM | POA: Diagnosis present

## 2018-10-16 IMAGING — MG DIGITAL SCREENING BILATERAL MAMMOGRAM WITH TOMO AND CAD
6 of 10 series · 6 of 30 positions shown · non-contrast
Comparison: Previous exam(s).

CLINICAL DATA: Screening.

EXAM:
DIGITAL SCREENING BILATERAL MAMMOGRAM WITH TOMO AND CAD

[R CC synth-2D]
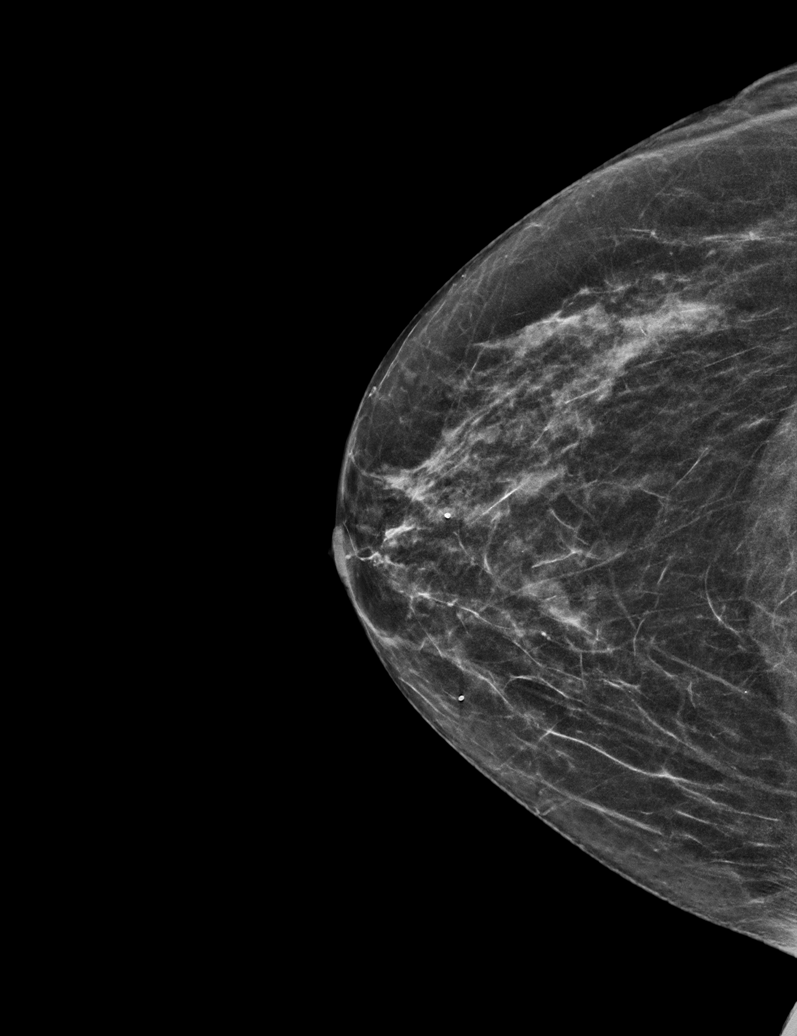

[L MLO synth-2D]
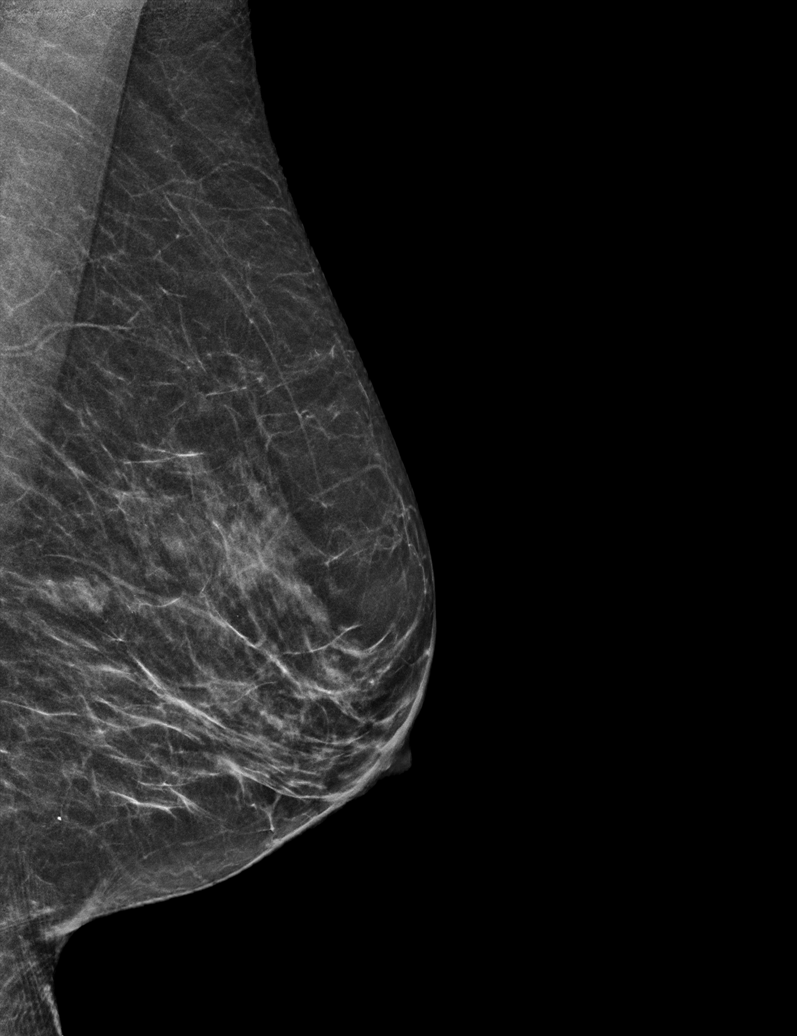

[L CC synth-2D (1 of 2)]
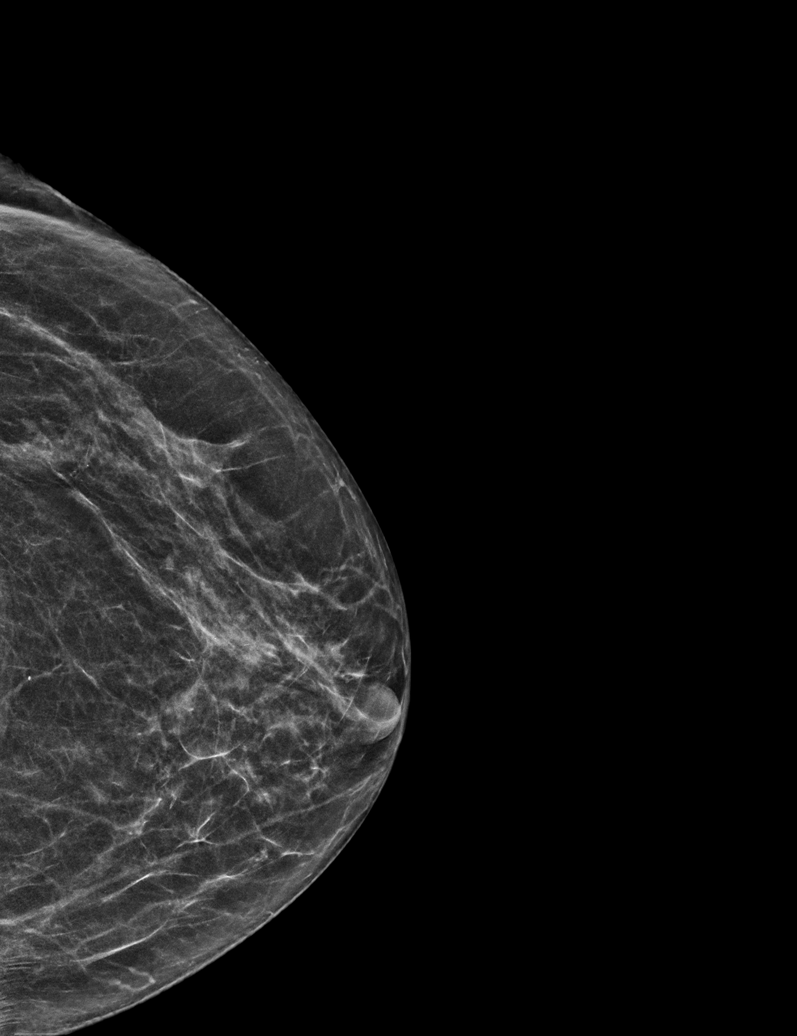

[R MLO synth-2D]
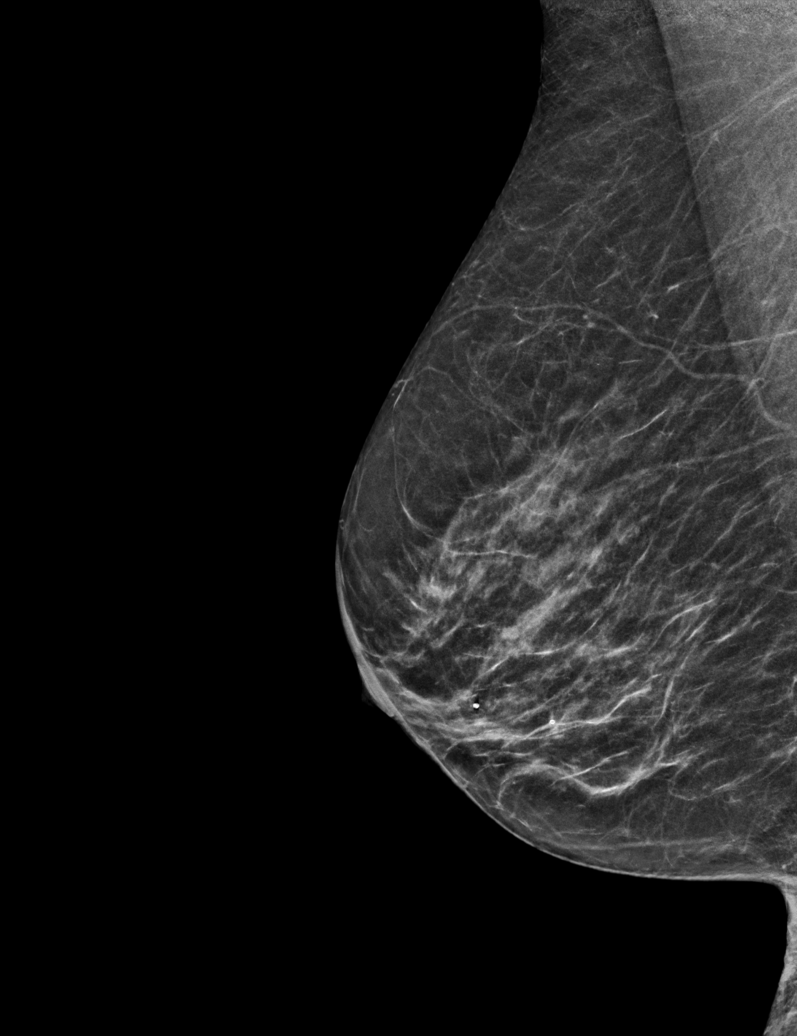

[L CC synth-2D (2 of 2)]
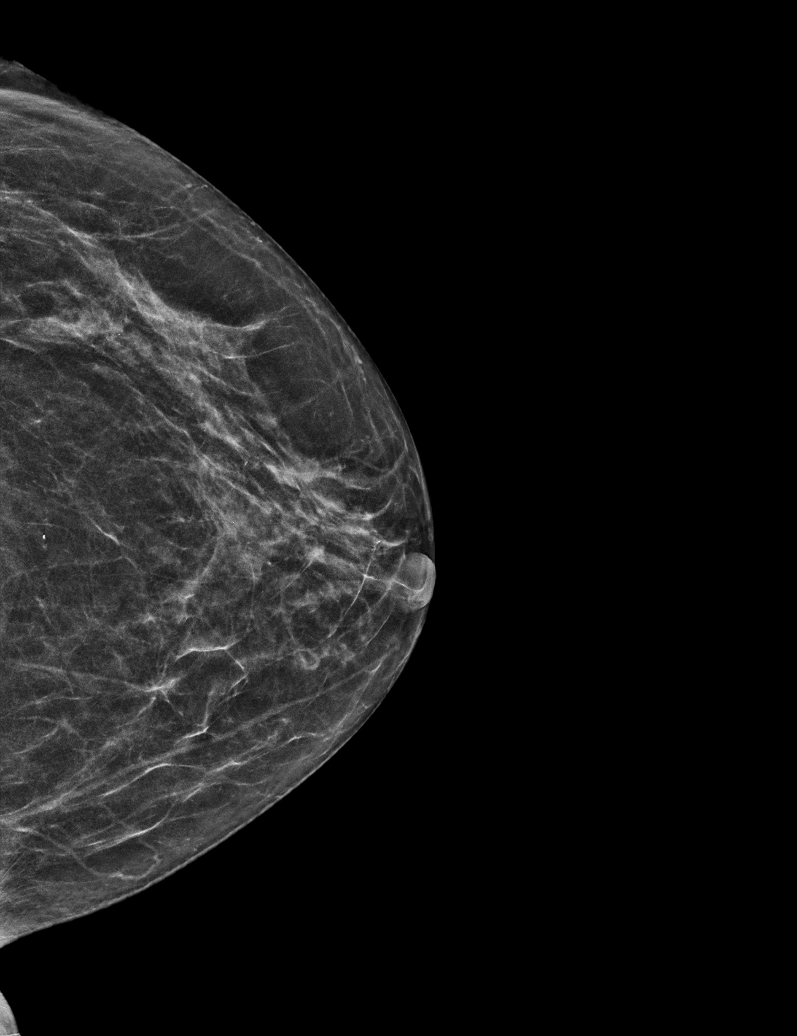

[L MLO tomo · tomo slice 23/44.0]
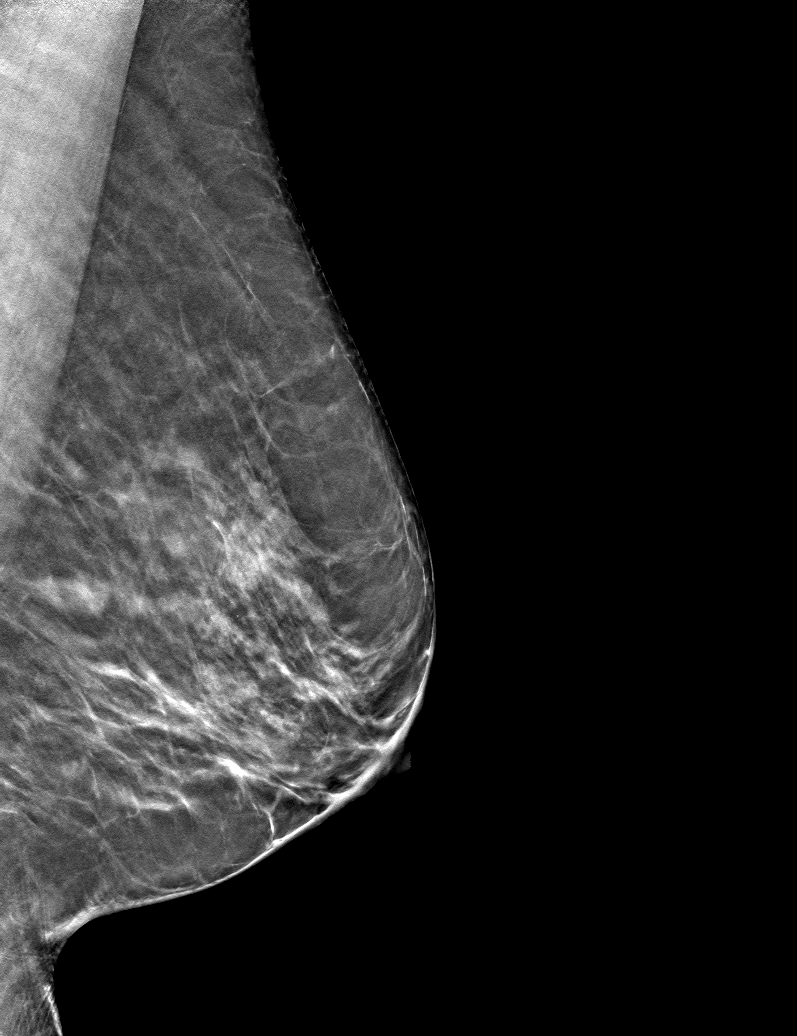

[6 of 30 positions shown; findings below may reference images not displayed]

ACR Breast Density Category b: There are scattered areas of
fibroglandular density.
FINDINGS: There are no findings suspicious for malignancy. Images were
processed with CAD.
IMPRESSION: No mammographic evidence of malignancy. A result letter of this
screening mammogram will be mailed directly to the patient.

RECOMMENDATION:
Screening mammogram in one year. (Code:[TQ])

BI-RADS CATEGORY  1: Negative.

## 2018-11-05 ENCOUNTER — Telehealth: Payer: Self-pay | Admitting: *Deleted

## 2018-11-05 ENCOUNTER — Encounter: Payer: Self-pay | Admitting: *Deleted

## 2018-11-05 DIAGNOSIS — Z87891 Personal history of nicotine dependence: Secondary | ICD-10-CM

## 2018-11-05 DIAGNOSIS — Z122 Encounter for screening for malignant neoplasm of respiratory organs: Secondary | ICD-10-CM

## 2018-11-05 NOTE — Telephone Encounter (Signed)
Received referral for initial lung cancer screening scan. Contacted patient and obtained smoking history,(former, quit 2007, 52 pack year) as well as answering questions related to screening process. Patient denies signs of lung cancer such as weight loss or hemoptysis. Patient denies comorbidity that would prevent curative treatment if lung cancer were found. Patient is scheduled for shared decision making visit and CT scan on 11/16/18 at 115pm.

## 2018-11-16 ENCOUNTER — Encounter: Payer: Self-pay | Admitting: Nurse Practitioner

## 2018-11-16 ENCOUNTER — Ambulatory Visit
Admission: RE | Admit: 2018-11-16 | Discharge: 2018-11-16 | Disposition: A | Discharge status: Home / Self Care | Payer: Medicare Other | Source: Ambulatory Visit | Attending: Oncology | Admitting: Oncology

## 2018-11-16 ENCOUNTER — Ambulatory Visit: Admission: RE | Admit: 2018-11-16 | Payer: Medicare Other | Source: Ambulatory Visit

## 2018-11-16 ENCOUNTER — Inpatient Hospital Stay: Payer: Medicare Other | Attending: Oncology | Admitting: Oncology

## 2018-11-16 ENCOUNTER — Other Ambulatory Visit: Payer: Self-pay

## 2018-11-16 DIAGNOSIS — Z87891 Personal history of nicotine dependence: Secondary | ICD-10-CM | POA: Diagnosis not present

## 2018-11-16 DIAGNOSIS — Z122 Encounter for screening for malignant neoplasm of respiratory organs: Secondary | ICD-10-CM | POA: Insufficient documentation

## 2018-11-16 IMAGING — CT CT CHEST LUNG CANCER SCREENING LOW DOSE
2 of 6 series · 15 of 40 positions shown, 18 images · non-contrast
Comparison: Standard CT chest [DATE].

CLINICAL DATA: 72-year-old female with 52 pack-year history of
smoking. Lung cancer screening.

EXAM:
CT CHEST WITHOUT CONTRAST LOW-DOSE FOR LUNG CANCER SCREENING
TECHNIQUE: Multidetector CT imaging of the chest was performed following the
standard protocol without IV contrast.

[Series 3: lung · axial · 0.62mm/px · z∈[-1243,-920]mm · 12 of 363 slices shown, 15 images (1 of 2)]
[im 20/363  mediastinal]
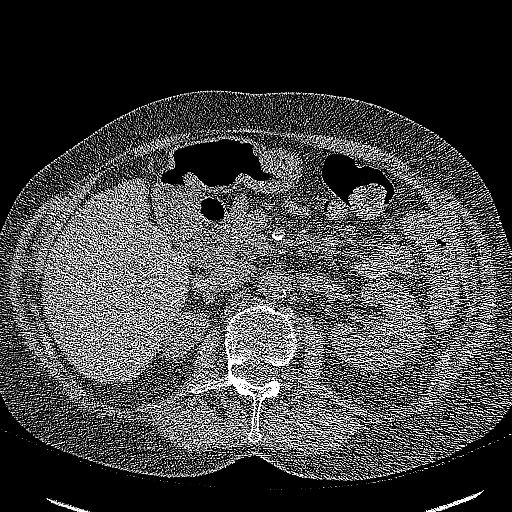
[im 20/363  lung]
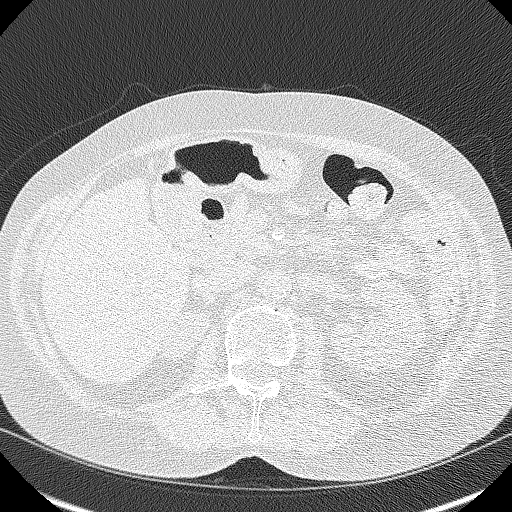
[im 58/363  lung]
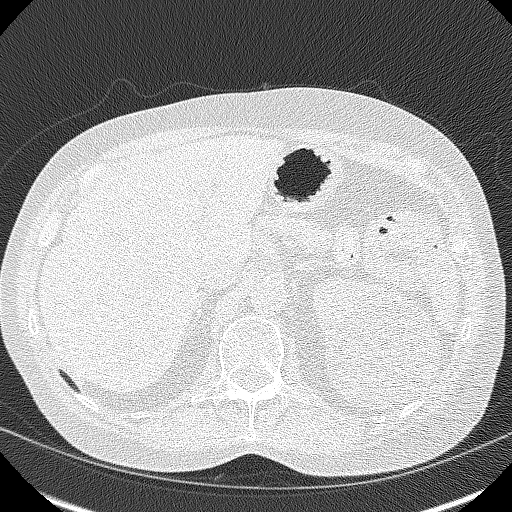
[im 77/363  lung]
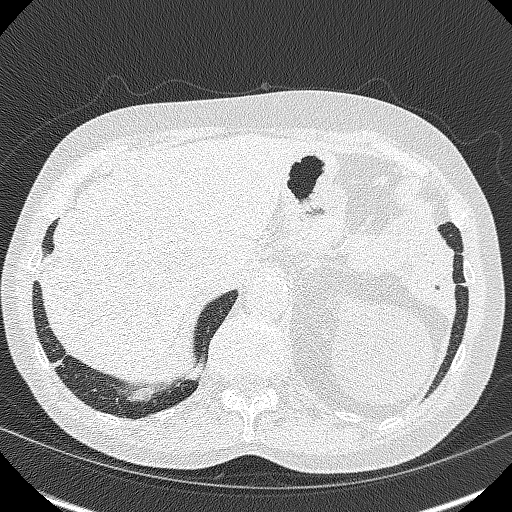
[im 115/363  lung]
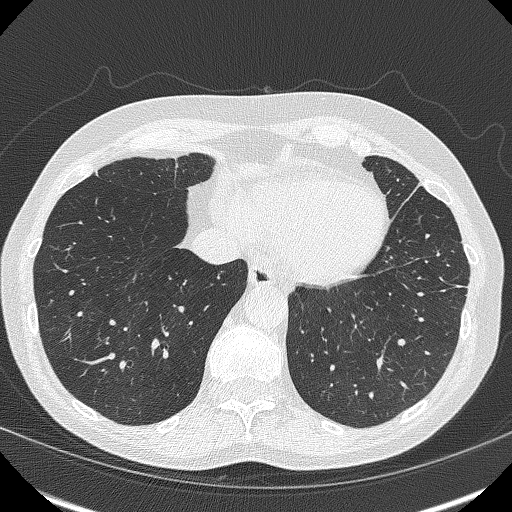
[im 134/363  mediastinal]
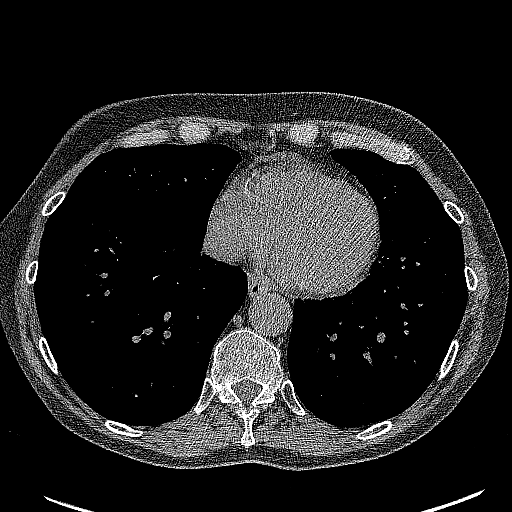
[im 134/363  lung]
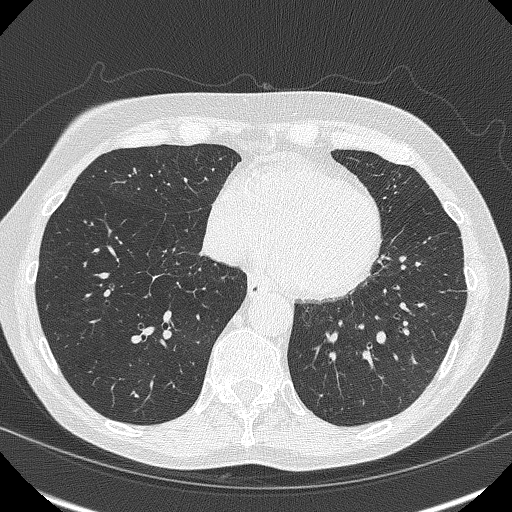
[im 172/363  lung]
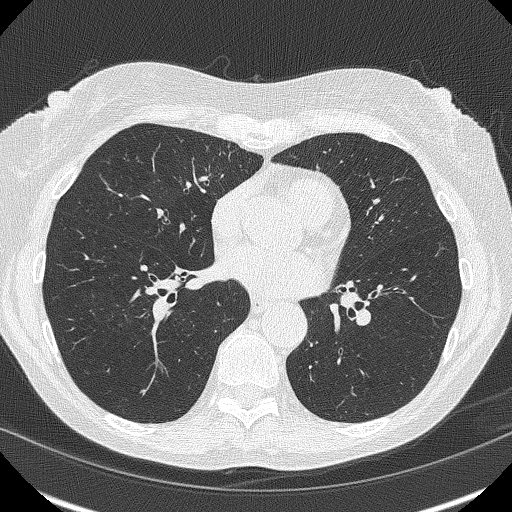
[im 191/363  lung]
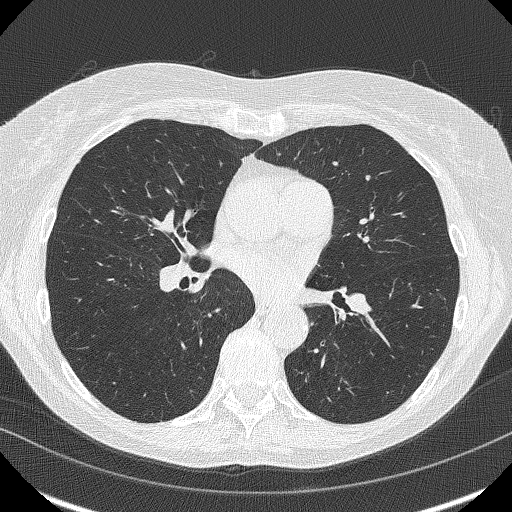
[im 229/363  lung]
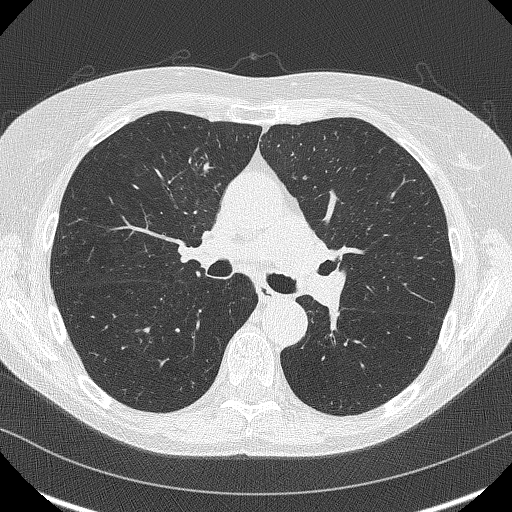
[im 248/363  mediastinal]
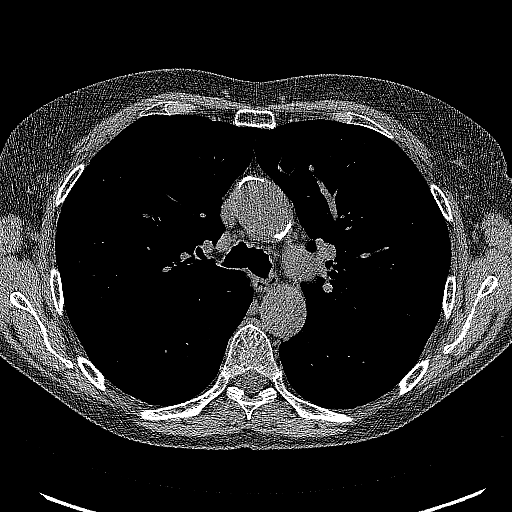
[im 248/363  lung]
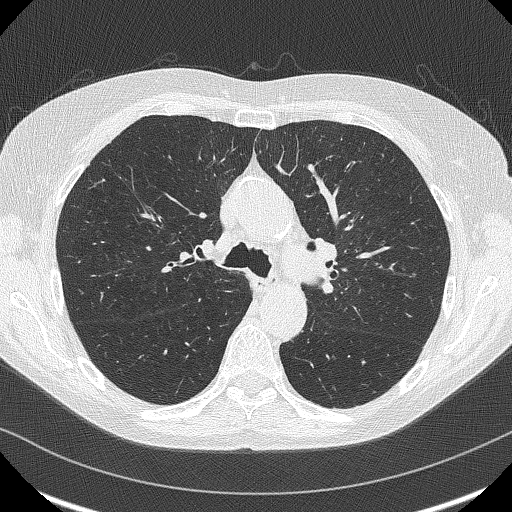
[im 286/363  lung]
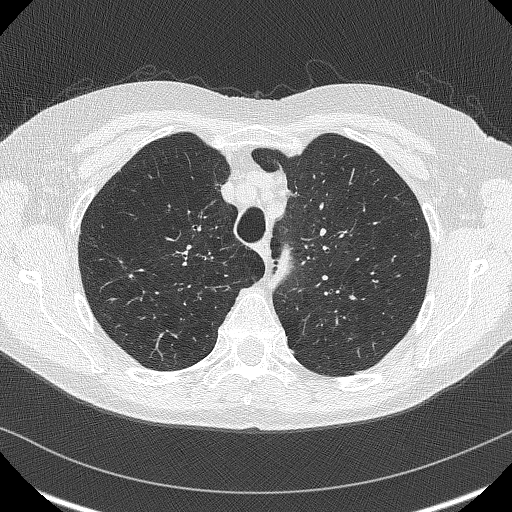
[im 305/363  lung]
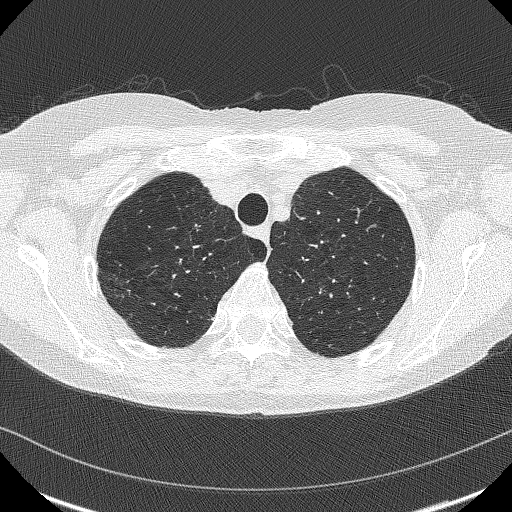
[im 343/363  lung]
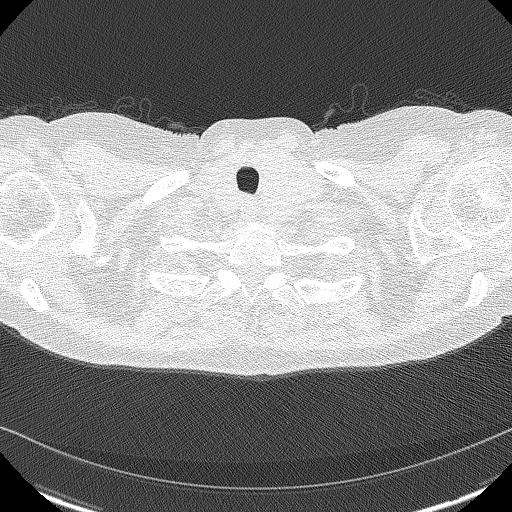

[Series 4: lung · coronal · 0.62mm/px · 3 of 259 slices shown (2 of 2)]
[im 52/259  lung]
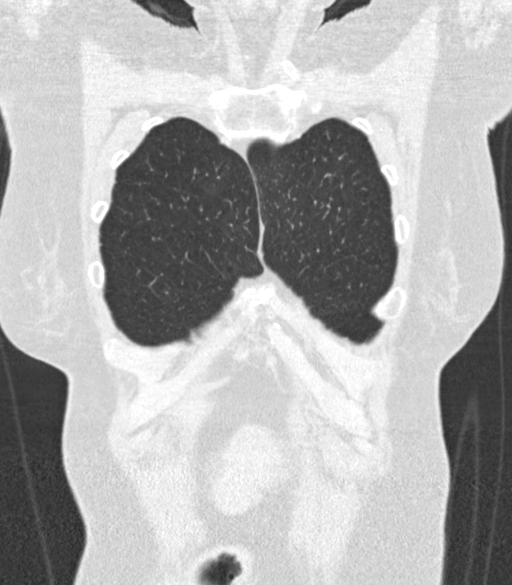
[im 104/259  lung]
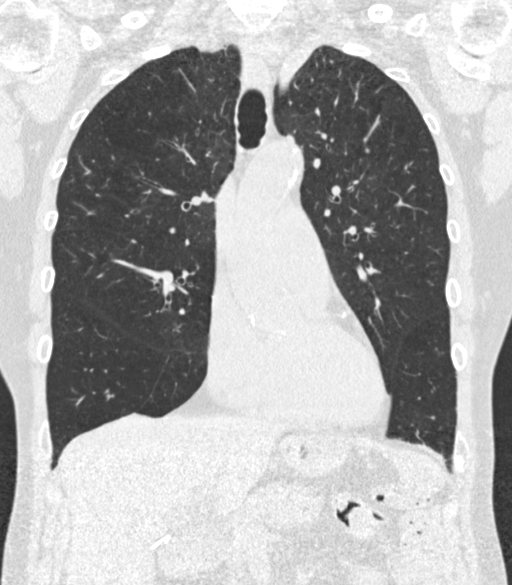
[im 155/259  lung]
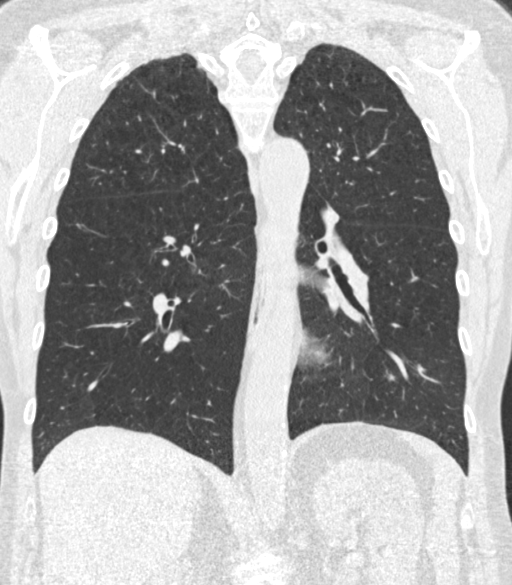

[15 of 40 positions shown; findings below may reference images not displayed]

FINDINGS: Cardiovascular: The heart size is normal. No substantial pericardial
effusion. Coronary artery calcification is evident. Atherosclerotic
calcification is noted in the wall of the thoracic aorta.

Mediastinum/Nodes: No mediastinal lymphadenopathy. No evidence for
gross hilar lymphadenopathy although assessment is limited by the
lack of intravenous contrast on today's study. The esophagus has
normal imaging features. There is no axillary lymphadenopathy. Left
greater than right thyroid enlargement is similar to prior.

Lungs/Pleura: Centrilobular and paraseptal emphysema noted. Biapical
pleuroparenchymal scarring evident. A peripheral opacity in the
posterior right lower lobe has a wedge-shaped configuration
inferiorly but a more ill-defined masslike quality along its cranial
margin. This lesion demonstrates a volume derived equivalent
diameter of 14.6 mm. Other scattered bilateral pulmonary nodules
measure up to 3.9 mm. No pleural effusion.

Upper Abdomen: 7.2 cm cyst upper pole left kidney increased from
cm previously. Nonobstructing stone identified upper pole left
kidney.

Musculoskeletal: No worrisome lytic or sclerotic osseous
abnormality.
IMPRESSION: 1. Lung-RADS 4A, suspicious. Follow up low-dose chest CT without
contrast in 3 months (please use the following order, "CT CHEST LCS
NODULE FOLLOW-UP W/O CM") is recommended. Alternatively, PET may be
considered when there is a solid component 8mm or larger.
2.  Emphysema. ([3N]-[3N])
3.  Aortic Atherosclerois ([3N]-170.0)

## 2018-11-17 ENCOUNTER — Telehealth: Payer: Self-pay | Admitting: *Deleted

## 2018-11-17 NOTE — Progress Notes (Signed)
In accordance with CMS guidelines, patient has met eligibility criteria including age, absence of signs or symptoms of lung cancer.  Social History   Tobacco Use  . Smoking status: Former Smoker    Packs/day: 1.00    Years: 52.00    Pack years: 52.00    Types: Cigarettes    Last attempt to quit: 2007    Years since quitting: 13.1  . Smokeless tobacco: Never Used  Substance Use Topics  . Alcohol use: Not on file  . Drug use: Not on file     A shared decision-making session was conducted prior to the performance of CT scan. This includes one or more decision aids, includes benefits and harms of screening, follow-up diagnostic testing, over-diagnosis, false positive rate, and total radiation exposure.  Counseling on the importance of adherence to annual lung cancer LDCT screening, impact of co-morbidities, and ability or willingness to undergo diagnosis and treatment is imperative for compliance of the program.  Counseling on the importance of continued smoking cessation for former smokers; the importance of smoking cessation for current smokers, and information about tobacco cessation interventions have been given to patient including [place] and [employer] quit [place] programs.  Written order for lung cancer screening with LDCT has been given to the patient and any and all questions have been answered to the best of my abilities.   Yearly follow up will be coordinated by Burgess Estelle, Thoracic Navigator.  Faythe Casa, NP 11/17/2018 8:54 AM

## 2018-11-17 NOTE — Telephone Encounter (Signed)
Notified patient of LDCT lung cancer screening program results with recommendation for 3 month follow up imaging. Also notified of incidental findings noted below and is encouraged to discuss further with PCP who will receive a copy of this note and/or the CT report. Patient verbalizes understanding.   Will send results to Dr. Karna Christmas as well as discuss with radiology in multidisciplinary conference this week for recommendation.  IMPRESSION: 1. Lung-RADS 4A, suspicious. Follow up low-dose chest CT without contrast in 3 months (please use the following order, "CT CHEST LCS NODULE FOLLOW-UP W/O CM") is recommended. Alternatively, PET may be considered when there is a solid component 24mm or larger. 2.  Emphysema. (ICD10-J43.9) 3.  Aortic Atherosclerois (ICD10-170.0)

## 2018-11-19 ENCOUNTER — Telehealth: Payer: Self-pay | Admitting: *Deleted

## 2018-11-19 NOTE — Telephone Encounter (Signed)
After further discussion with interventional radiologist at multidisciplinary thoracic conference, contacted patient and gave opinion to do 3 month follow up non contrast CT scan. Patient understands and is in agreement with this plan of care.   IMPRESSION: 1. Lung-RADS 4A, suspicious. Follow up low-dose chest CT without contrast in 3 months (please use the following order, "CT CHEST LCS NODULE FOLLOW-UP W/O CM") is recommended. Alternatively, PET may be considered when there is a solid component 5mm or larger. 2.  Emphysema. (ICD10-J43.9) 3.  Aortic Atherosclerois (ICD10-170.0)

## 2018-11-20 NOTE — Telephone Encounter (Signed)
Thank you sir appreciate your effort.

## 2018-12-11 ENCOUNTER — Ambulatory Visit: Admission: RE | Admit: 2018-12-11 | Payer: Medicare Other | Source: Home / Self Care | Admitting: Gastroenterology

## 2018-12-11 ENCOUNTER — Encounter: Admission: RE | Payer: Self-pay | Source: Home / Self Care

## 2018-12-11 SURGERY — COLONOSCOPY WITH PROPOFOL
Anesthesia: General

## 2019-02-19 ENCOUNTER — Telehealth: Payer: Self-pay

## 2019-02-19 ENCOUNTER — Telehealth: Payer: Self-pay | Admitting: *Deleted

## 2019-02-19 DIAGNOSIS — Z122 Encounter for screening for malignant neoplasm of respiratory organs: Secondary | ICD-10-CM

## 2019-02-19 DIAGNOSIS — Z87891 Personal history of nicotine dependence: Secondary | ICD-10-CM

## 2019-02-19 DIAGNOSIS — R918 Other nonspecific abnormal finding of lung field: Secondary | ICD-10-CM

## 2019-02-19 NOTE — Telephone Encounter (Signed)
Call pt regarding lung screening. PT is a former smoker. Pt denies any new health issue. Scan is scheduled for Tuesday June 16th at 9:15.

## 2019-02-19 NOTE — Telephone Encounter (Signed)
Patient is agreeable for lcs nodule f/u ct.

## 2019-02-23 ENCOUNTER — Other Ambulatory Visit: Payer: Self-pay

## 2019-02-23 ENCOUNTER — Ambulatory Visit
Admission: RE | Admit: 2019-02-23 | Discharge: 2019-02-23 | Disposition: A | Discharge status: Home / Self Care | Payer: Medicare Other | Source: Ambulatory Visit | Attending: Oncology | Admitting: Oncology

## 2019-02-23 DIAGNOSIS — Z87891 Personal history of nicotine dependence: Secondary | ICD-10-CM | POA: Diagnosis present

## 2019-02-23 DIAGNOSIS — R918 Other nonspecific abnormal finding of lung field: Secondary | ICD-10-CM | POA: Diagnosis present

## 2019-02-23 DIAGNOSIS — Z122 Encounter for screening for malignant neoplasm of respiratory organs: Secondary | ICD-10-CM

## 2019-02-23 IMAGING — CT CT CHEST LCS NODULE FOLLOW-UP W/O CM
2 of 6 series · 15 of 40 positions shown, 18 images · non-contrast
Comparison: [DATE]

CLINICAL DATA: Lung cancer screening. Fifty-two pack-year history.
Former asymptomatic smoker.

EXAM:
CT CHEST WITHOUT CONTRAST FOR LUNG CANCER SCREENING NODULE FOLLOW-UP
TECHNIQUE: Multidetector CT imaging of the chest was performed following the
standard protocol without IV contrast.

[Series 3: lung lcs f/u · axial · 0.65mm/px · z∈[-1242,-922]mm · 12 of 358 slices shown, 15 images]
[im 19/358  mediastinal]
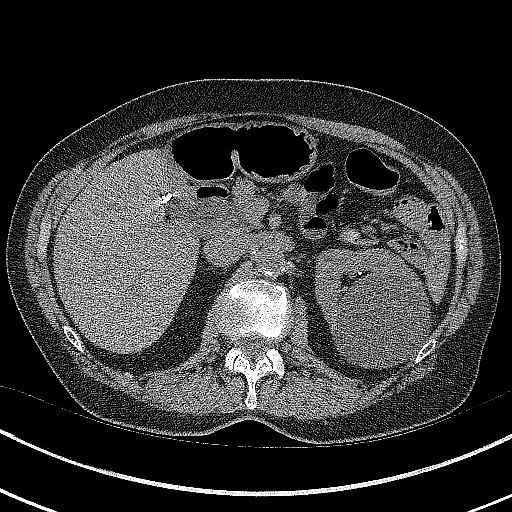
[im 19/358  lung]
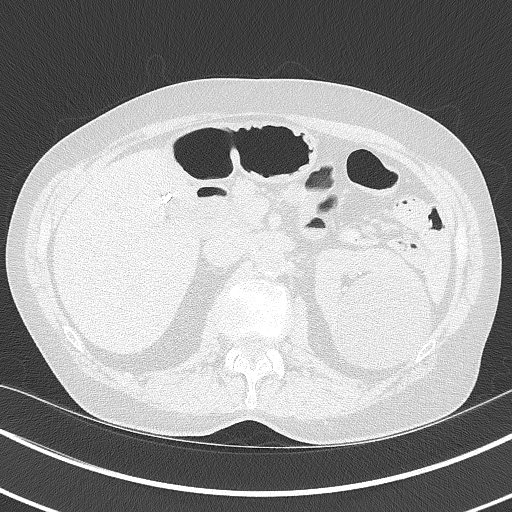
[im 57/358  lung]
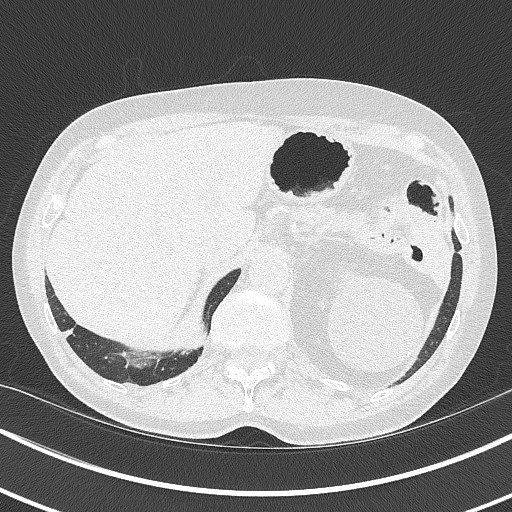
[im 76/358  lung]
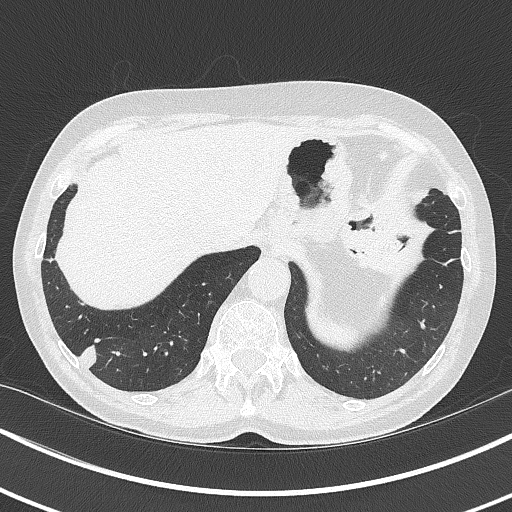
[im 113/358  lung]
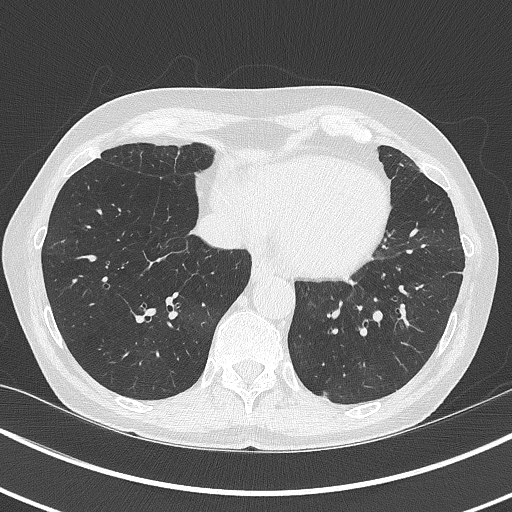
[im 132/358  mediastinal]
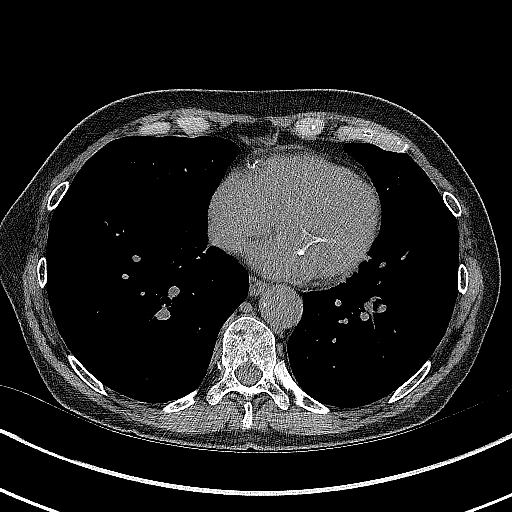
[im 132/358  lung]
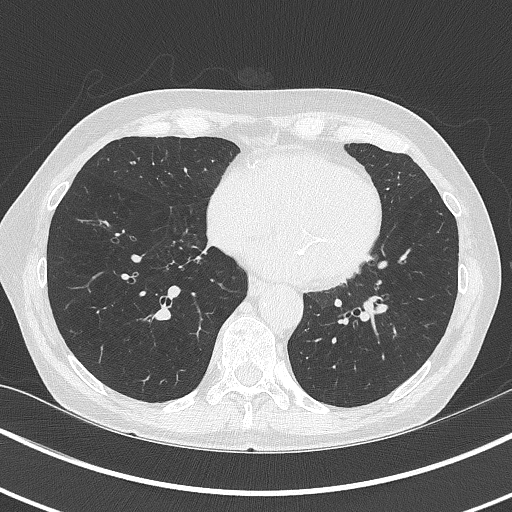
[im 170/358  lung]
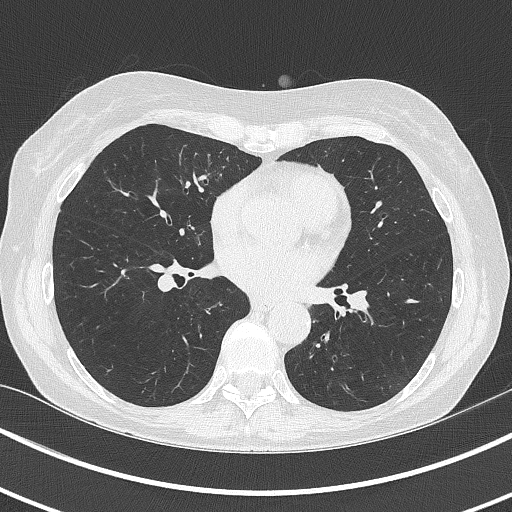
[im 188/358  lung]
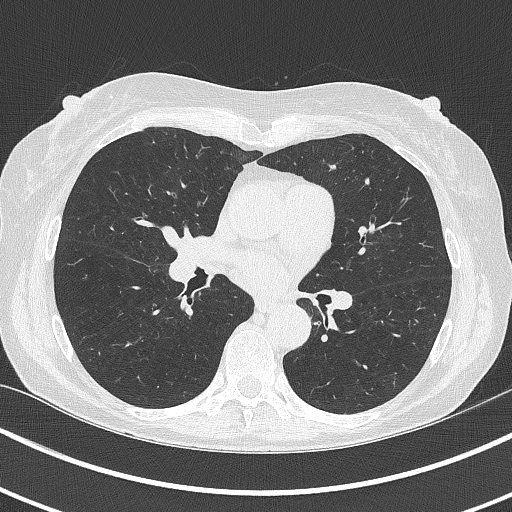
[im 226/358  lung]
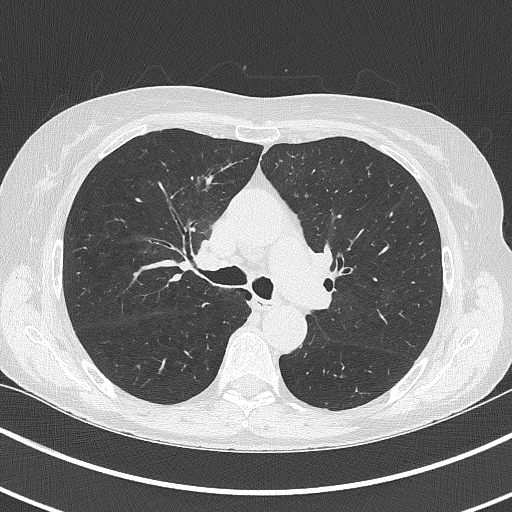
[im 245/358  mediastinal]
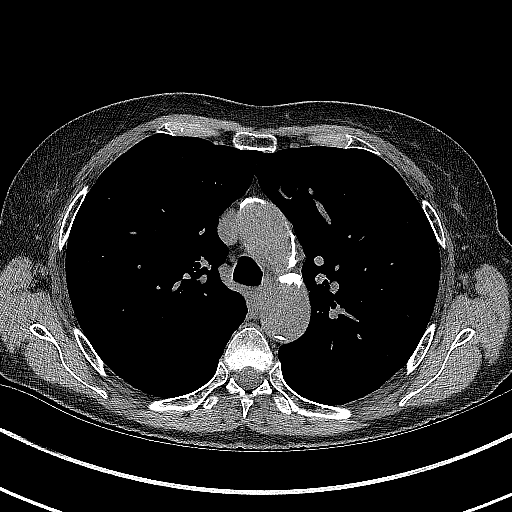
[im 245/358  lung]
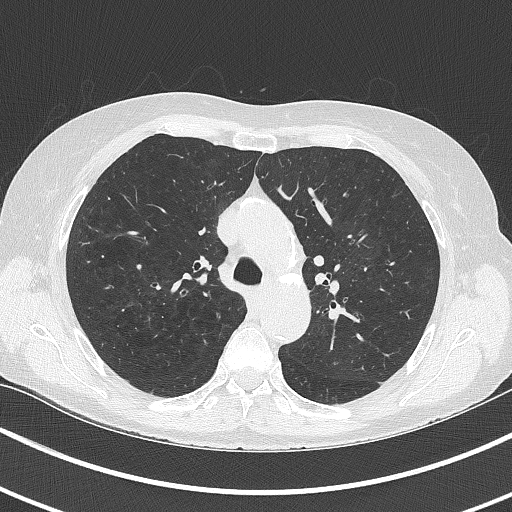
[im 282/358  lung]
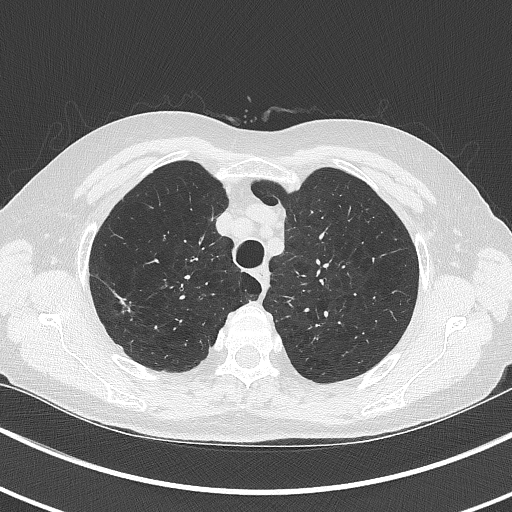
[im 301/358  lung]
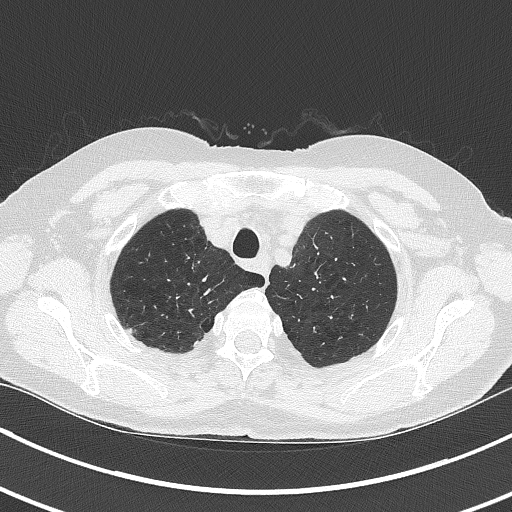
[im 339/358  lung]
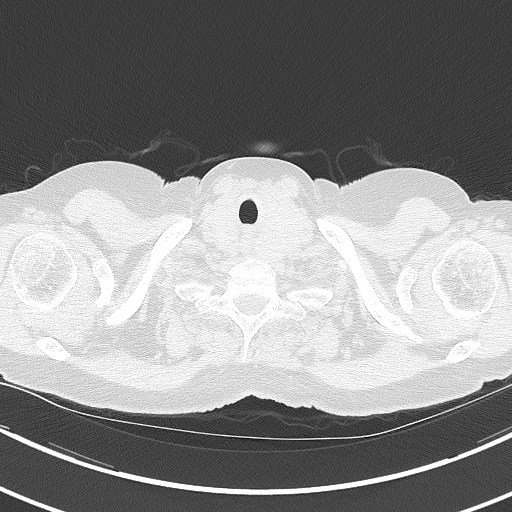

[Series 4: lcs f/u · coronal · 0.65mm/px · 3 of 311 slices shown]
[im 63/311  lung]
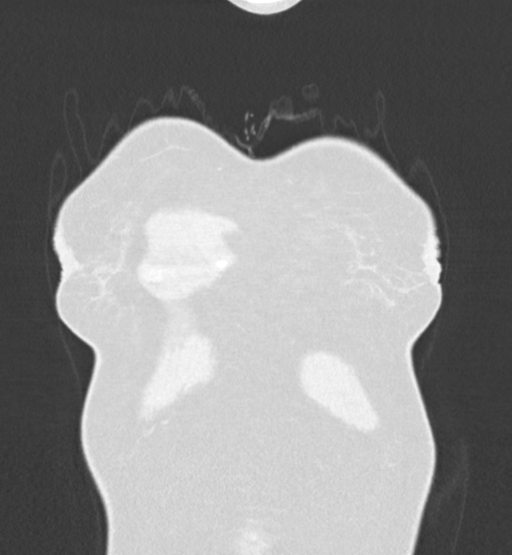
[im 125/311  lung]
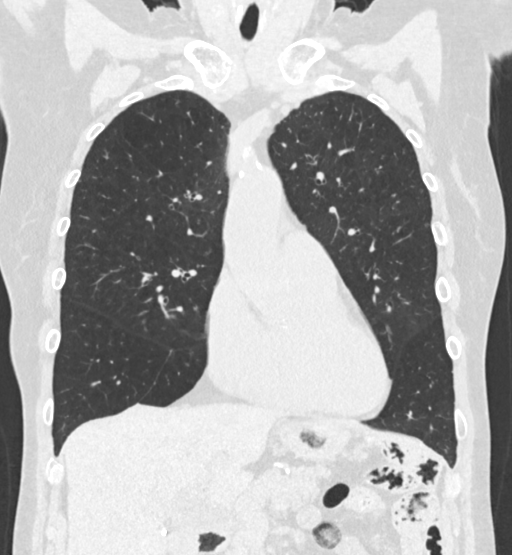
[im 187/311  lung]
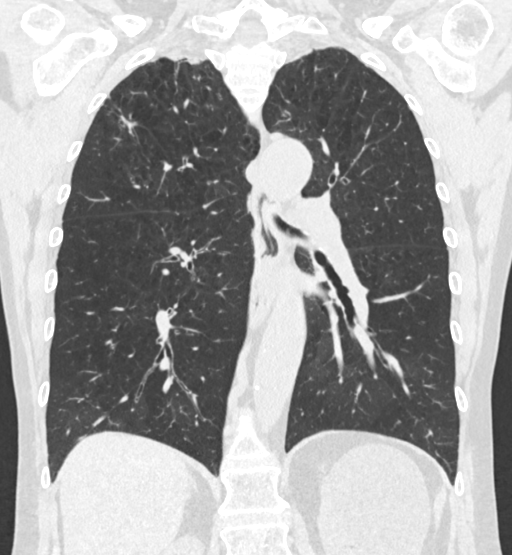

[15 of 40 positions shown; findings below may reference images not displayed]

FINDINGS: Cardiovascular: Heart size appears normal. No pericardial effusion
identified. Aortic atherosclerosis. Lad, left circumflex and RCA
coronary artery calcifications.

Mediastinum/Nodes: Asymmetric enlargement of the left lobe of
thyroid gland is again noted. Unchanged. The trachea appears patent
and is midline. Normal appearance of the esophagus. No axillary or
supraclavicular adenopathy.

Lungs/Pleura: Advanced emphysema with diffuse bronchial wall
thickening. Unchanged appearance of triangular-shaped subpleural
nodule within the posterior right lower lobe with an equivalent
diameter of 15.5 mm. Additional noncalcified nodules are unchanged
when compared with previous study. No new or enlarging pulmonary
nodules or masses.

Upper Abdomen: No acute abnormality. Previous cholecystectomy. Left
kidney cysts. Stone within the left kidney measures 2 mm.

Musculoskeletal: No chest wall mass or suspicious bone lesions
identified.
IMPRESSION: 1. Lung-RADS 2, benign appearance or behavior. Continue annual
screening with low-dose chest CT without contrast in 12 months.
2. Aortic Atherosclerosis ([G4]-[G4]) and Emphysema ([G4]-[G4]).
3. Coronary artery calcifications
4. Left kidney stone.

## 2019-02-25 ENCOUNTER — Encounter: Payer: Self-pay | Admitting: *Deleted

## 2019-09-15 ENCOUNTER — Other Ambulatory Visit: Payer: Self-pay | Admitting: Internal Medicine

## 2019-09-15 DIAGNOSIS — Z1231 Encounter for screening mammogram for malignant neoplasm of breast: Secondary | ICD-10-CM

## 2019-10-19 ENCOUNTER — Ambulatory Visit
Admission: RE | Admit: 2019-10-19 | Discharge: 2019-10-19 | Disposition: A | Discharge status: Home / Self Care | Payer: Medicare Other | Source: Ambulatory Visit | Attending: Internal Medicine | Admitting: Internal Medicine

## 2019-10-19 DIAGNOSIS — Z1231 Encounter for screening mammogram for malignant neoplasm of breast: Secondary | ICD-10-CM | POA: Diagnosis not present

## 2019-10-19 IMAGING — MG DIGITAL SCREENING BILAT W/ TOMO W/ CAD
8 series · 8 of 24 positions shown · non-contrast
Comparison: Previous exam(s).

CLINICAL DATA: Screening.

EXAM:
DIGITAL SCREENING BILATERAL MAMMOGRAM WITH TOMO AND CAD

[L MLO synth-2D]
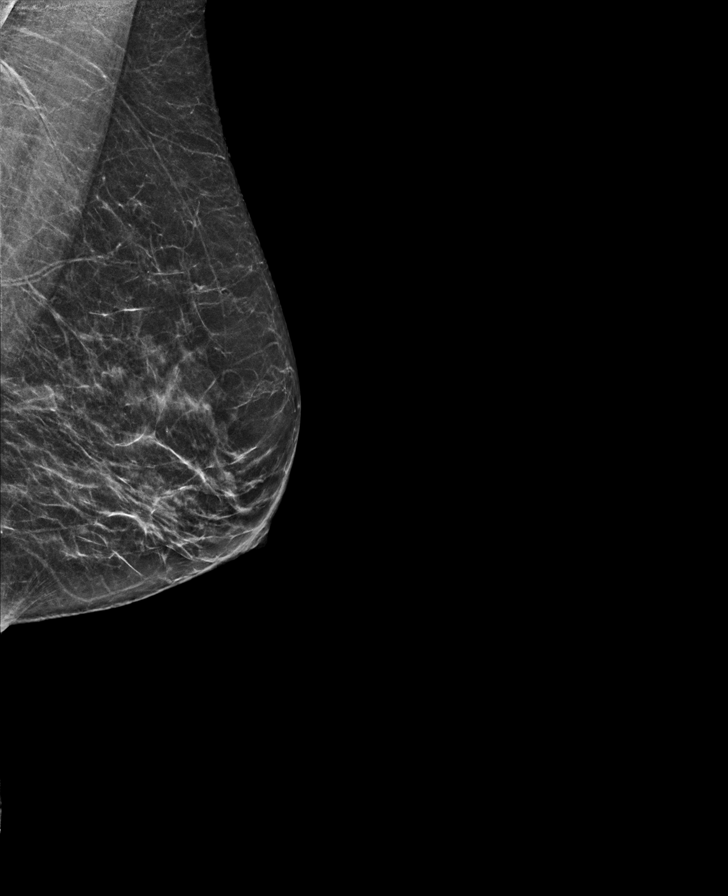

[L CC synth-2D]
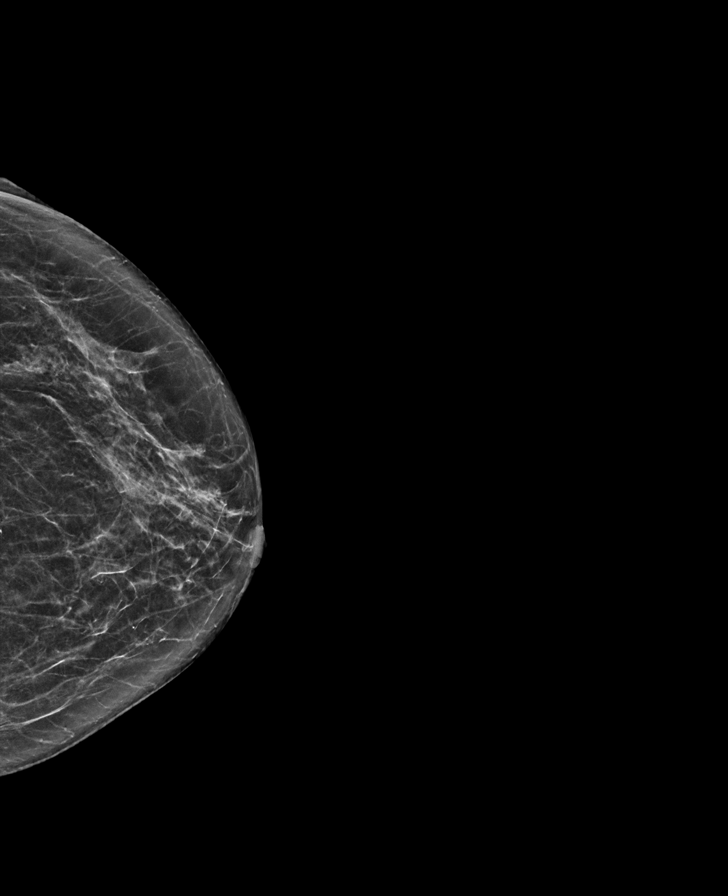

[R MLO synth-2D]
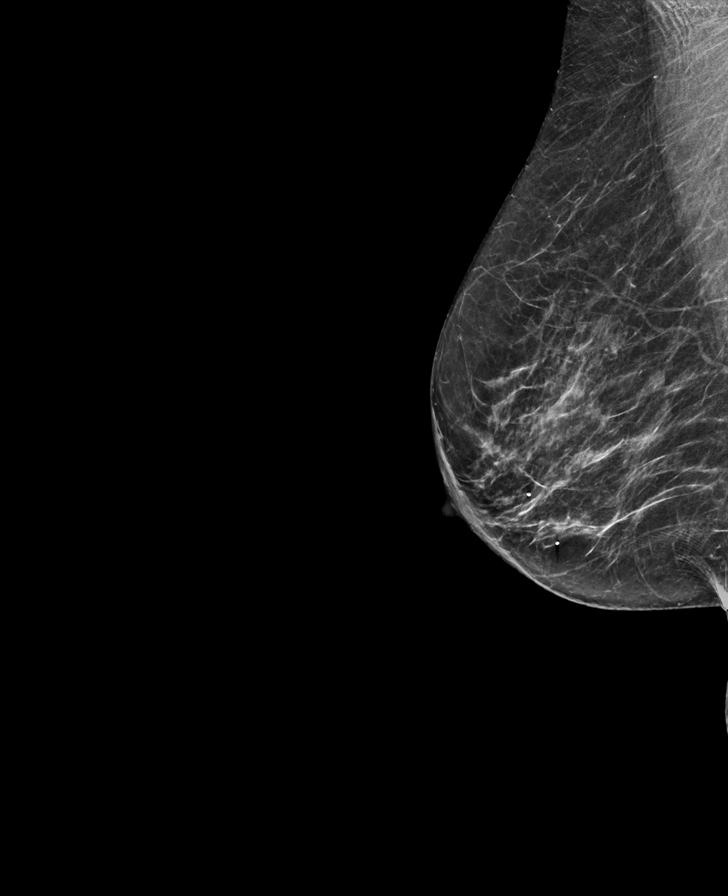

[R CC synth-2D]
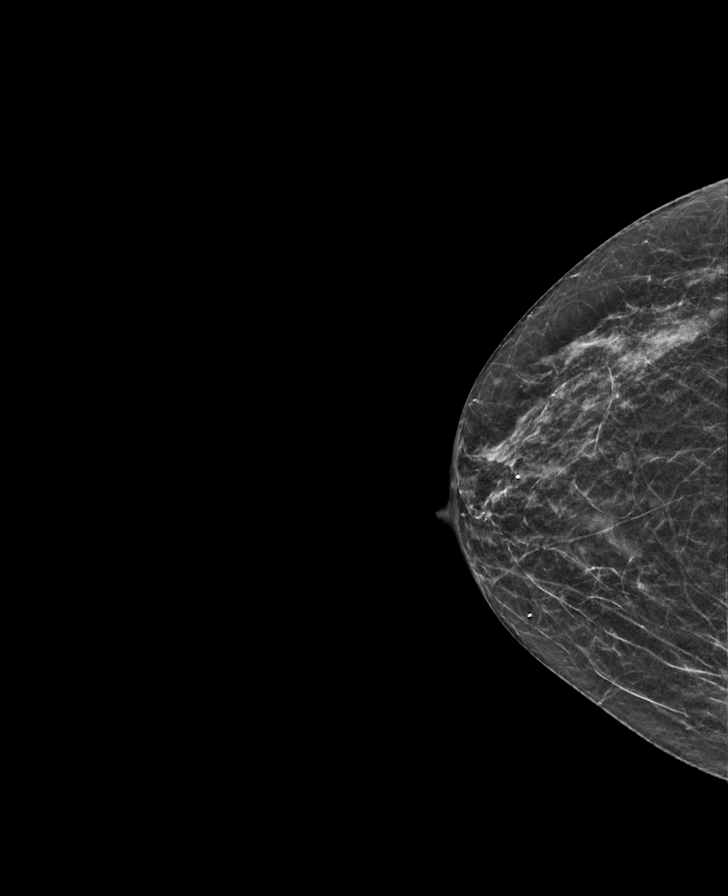

[L MLO tomo · tomo slice 27/54.0]
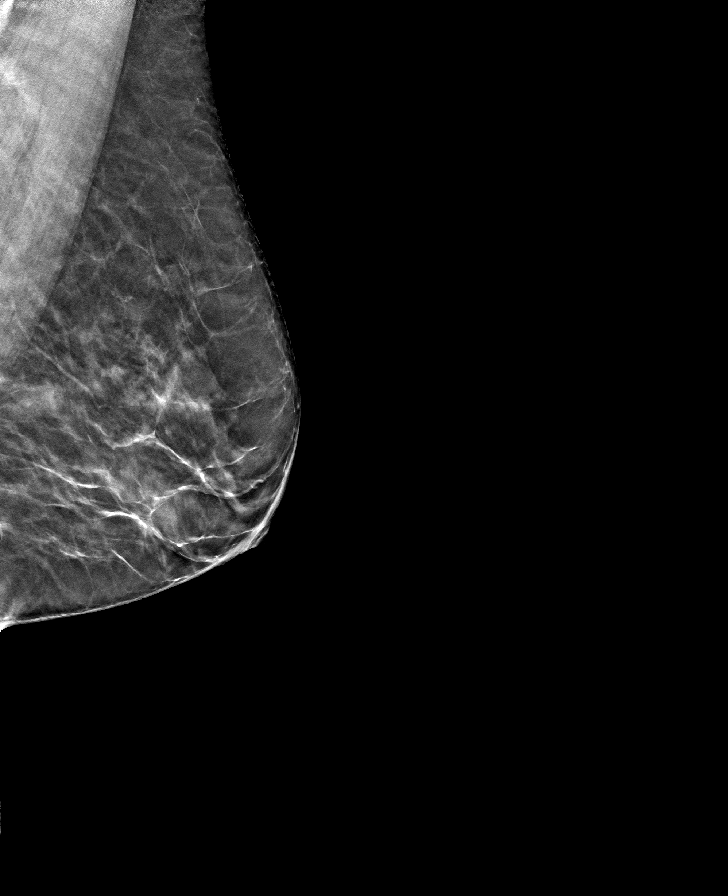

[L CC tomo · tomo slice 30/59.0]
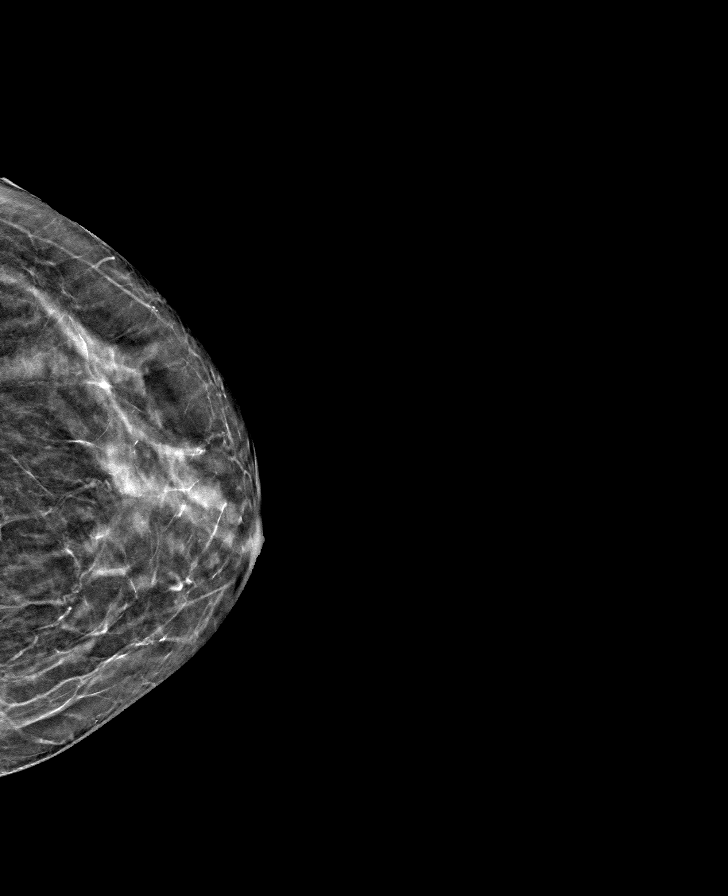

[R CC tomo · tomo slice 22/43.0]
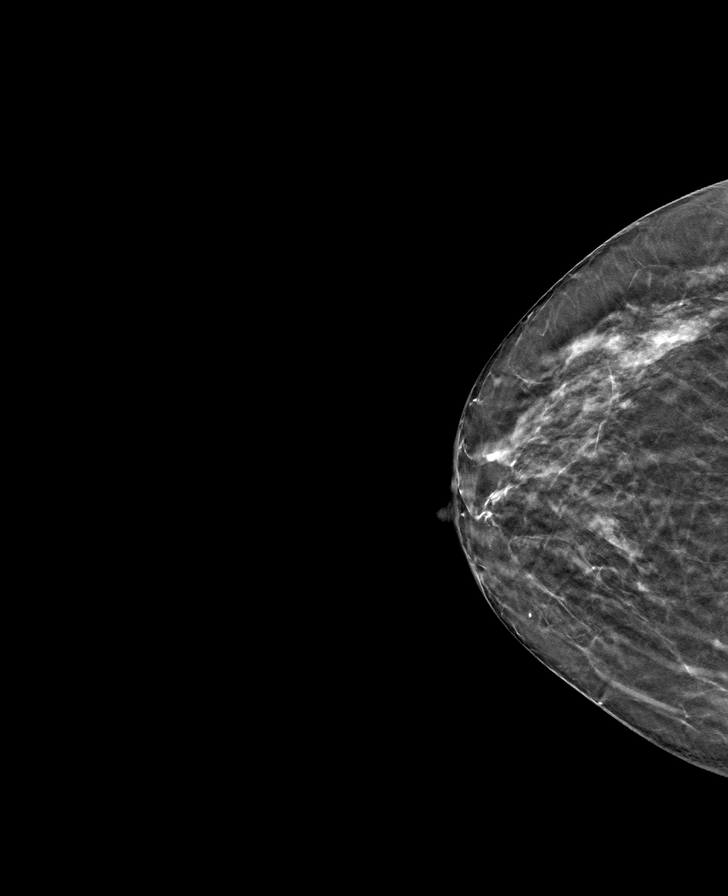

[R MLO tomo · tomo slice 28/55.0]
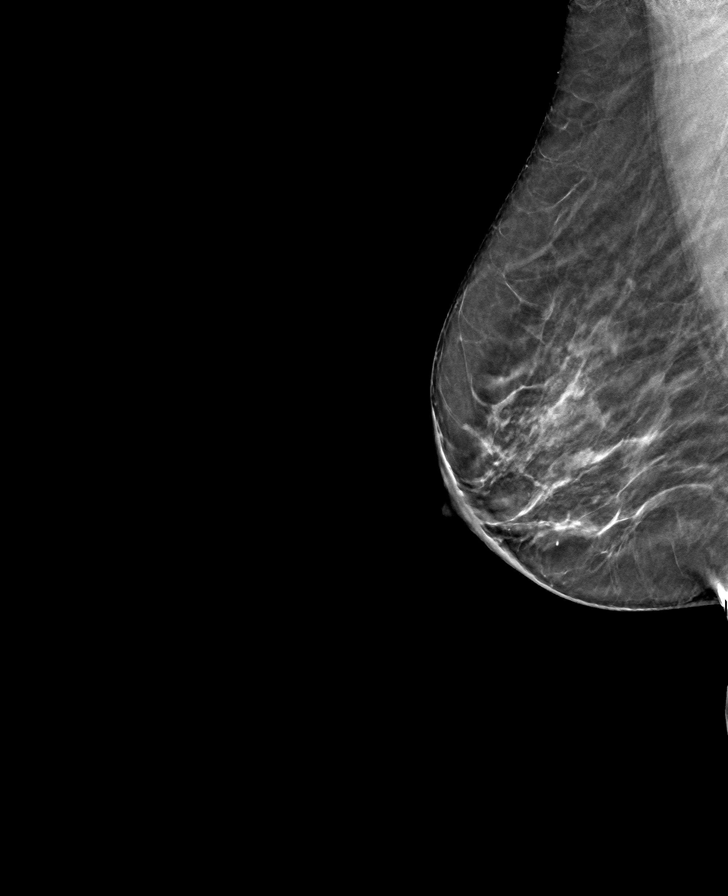

[8 of 24 positions shown; findings below may reference images not displayed]

ACR Breast Density Category c: The breast tissue is heterogeneously
dense, which may obscure small masses.
FINDINGS: There are no findings suspicious for malignancy. Images were
processed with CAD.
IMPRESSION: No mammographic evidence of malignancy. A result letter of this
screening mammogram will be mailed directly to the patient.

RECOMMENDATION:
Screening mammogram in one year. (Code:[5V])

BI-RADS CATEGORY  1: Negative.

## 2020-02-03 ENCOUNTER — Other Ambulatory Visit: Payer: Self-pay | Admitting: Internal Medicine

## 2020-02-03 DIAGNOSIS — M5116 Intervertebral disc disorders with radiculopathy, lumbar region: Secondary | ICD-10-CM

## 2020-02-17 ENCOUNTER — Telehealth: Payer: Self-pay

## 2020-02-17 ENCOUNTER — Other Ambulatory Visit: Payer: Self-pay

## 2020-02-17 ENCOUNTER — Ambulatory Visit
Admission: RE | Admit: 2020-02-17 | Discharge: 2020-02-17 | Disposition: A | Discharge status: Home / Self Care | Payer: Medicare Other | Source: Ambulatory Visit | Attending: Internal Medicine | Admitting: Internal Medicine

## 2020-02-17 DIAGNOSIS — Z122 Encounter for screening for malignant neoplasm of respiratory organs: Secondary | ICD-10-CM

## 2020-02-17 DIAGNOSIS — Z87891 Personal history of nicotine dependence: Secondary | ICD-10-CM

## 2020-02-17 DIAGNOSIS — M5116 Intervertebral disc disorders with radiculopathy, lumbar region: Secondary | ICD-10-CM | POA: Insufficient documentation

## 2020-02-17 IMAGING — MR MR LUMBAR SPINE W/O CM
5 series · 31 of 48 positions shown · non-contrast
Comparison: None.

CLINICAL DATA: Low back and right leg pain for 3 months.

EXAM:
MRI LUMBAR SPINE WITHOUT CONTRAST
TECHNIQUE: Multiplanar, multisequence MR imaging of the lumbar spine was
performed. No intravenous contrast was administered.

[Series 5: T2 · sagittal · 4.0mm · 0.81mm/px · 7 of 17 slices shown (1 of 2)]
[im 1/17]
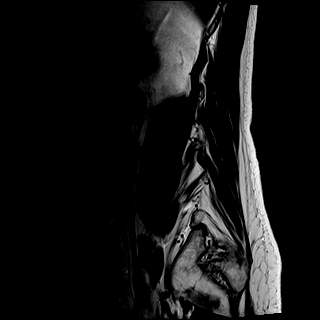
[im 3/17]
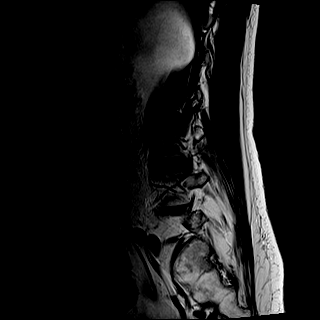
[im 6/17]
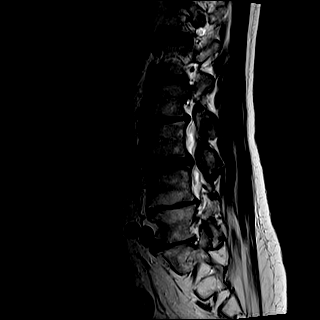
[im 9/17]
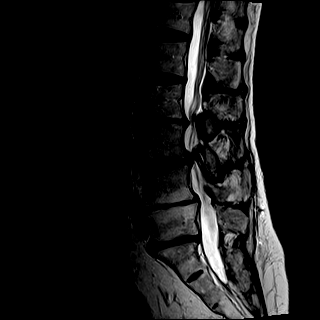
[im 11/17]
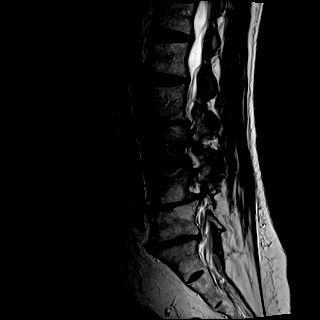
[im 14/17]
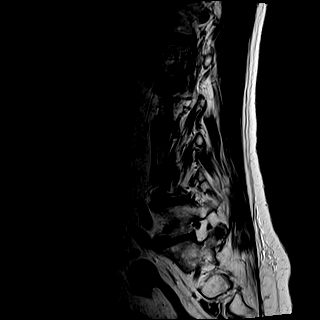
[im 17/17]
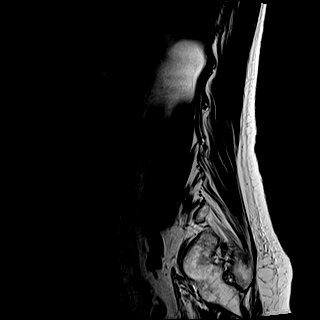

[Series 6: T1 · sagittal · 4.0mm · 0.81mm/px · 7 of 17 slices shown (1 of 2)]
[im 1/17]
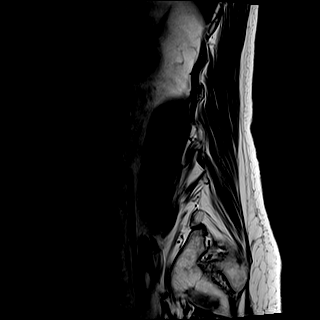
[im 3/17]
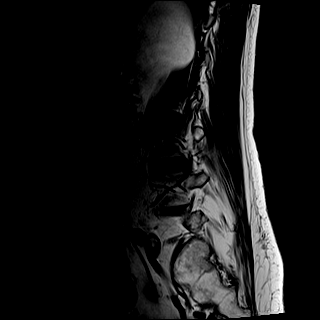
[im 6/17]
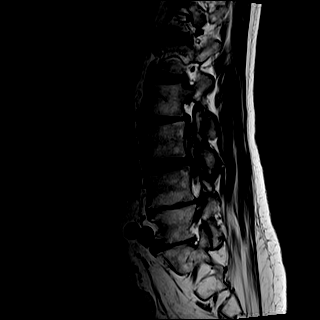
[im 9/17]
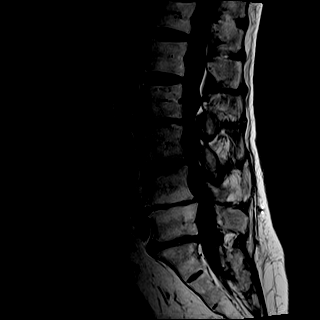
[im 11/17]
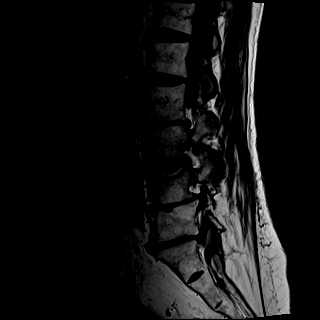
[im 14/17]
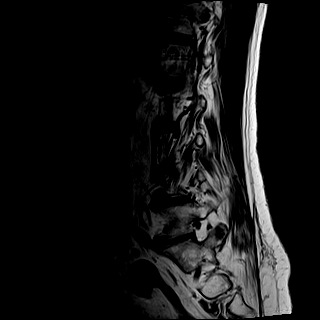
[im 17/17]
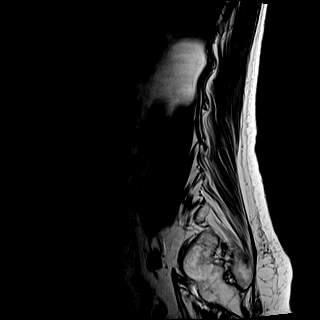

[Series 7: STIR · sagittal · 4.0mm · 0.41mm/px · 1 of 17 slices shown]
[im 1/17]
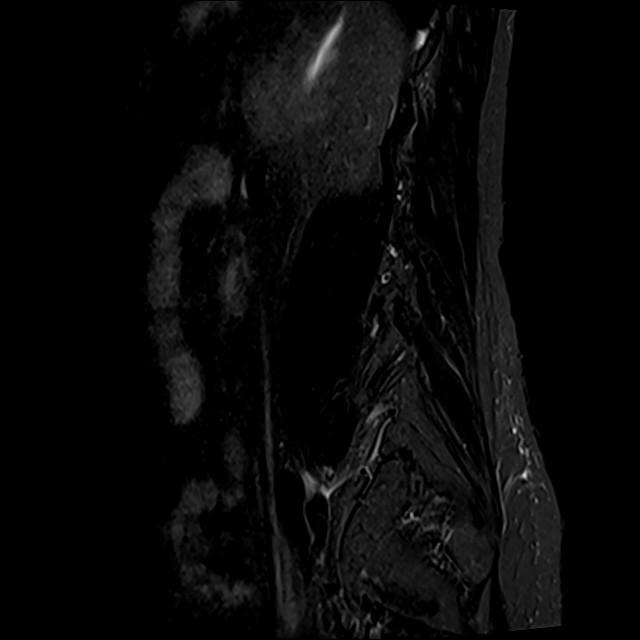

[Series 8: T2 · axial · 4.0mm · 0.78mm/px · z∈[-95,+120]mm · 8 of 38 slices shown (2 of 2)]
[im 1/38]
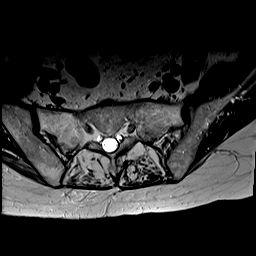
[im 6/38]
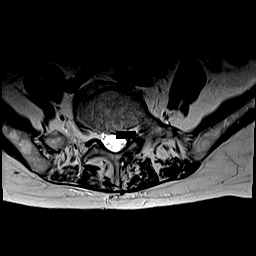
[im 12/38]
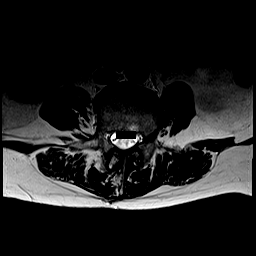
[im 18/38]
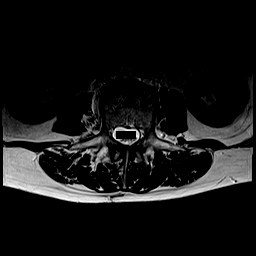
[im 20/38]
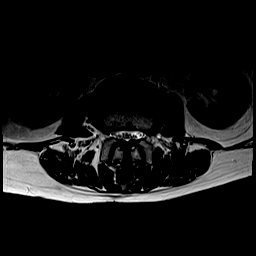
[im 26/38]
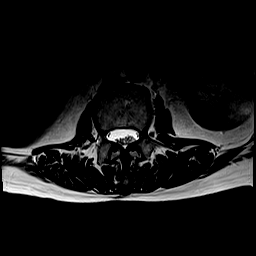
[im 32/38]
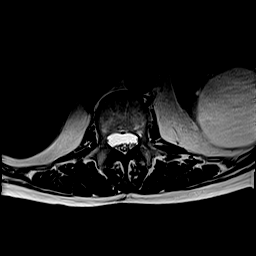
[im 38/38]
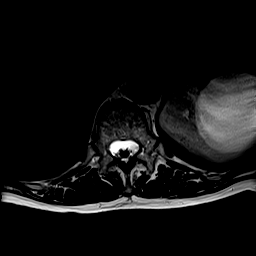

[Series 9: T1 · axial · 4.0mm · 0.39mm/px · z∈[-95,+120]mm · 8 of 38 slices shown (2 of 2)]
[im 1/38]
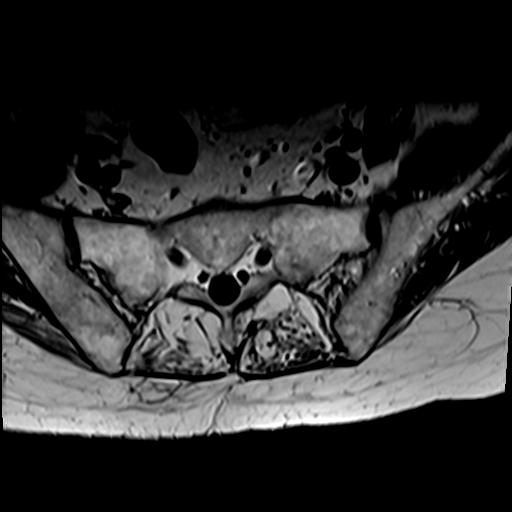
[im 6/38]
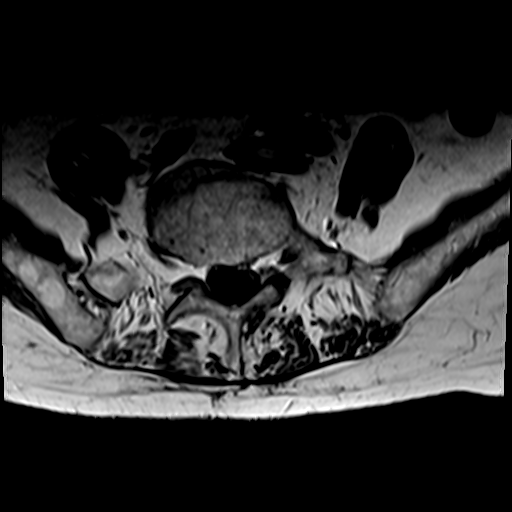
[im 12/38]
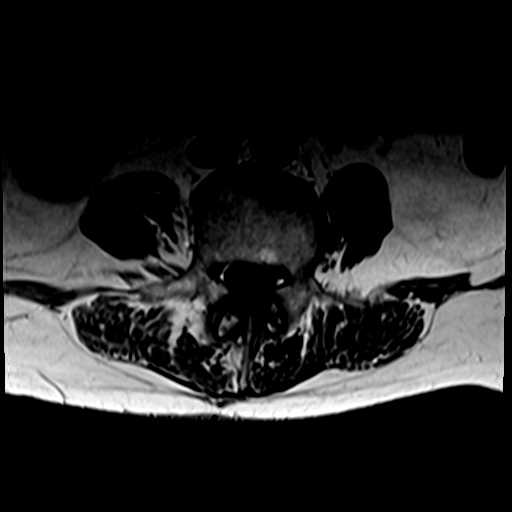
[im 18/38]
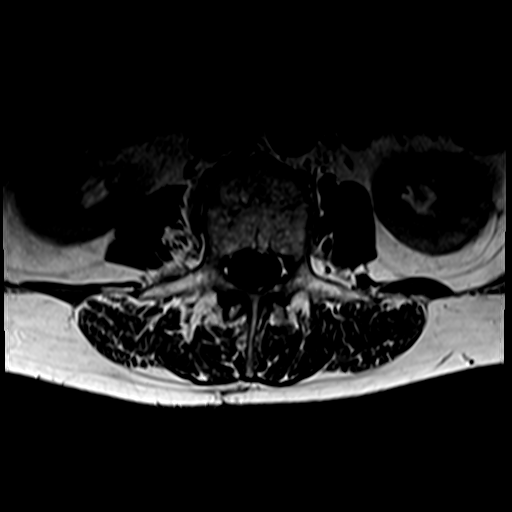
[im 20/38]
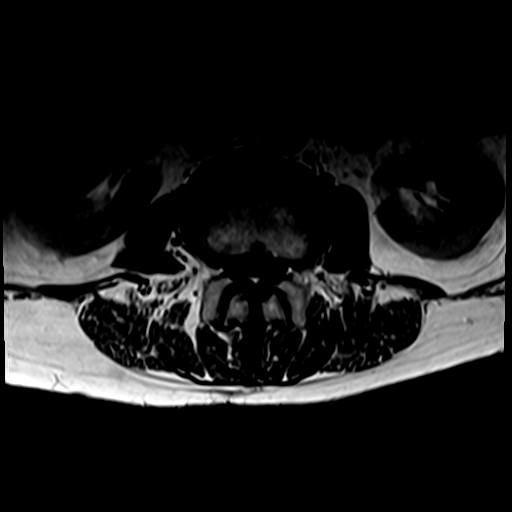
[im 26/38]
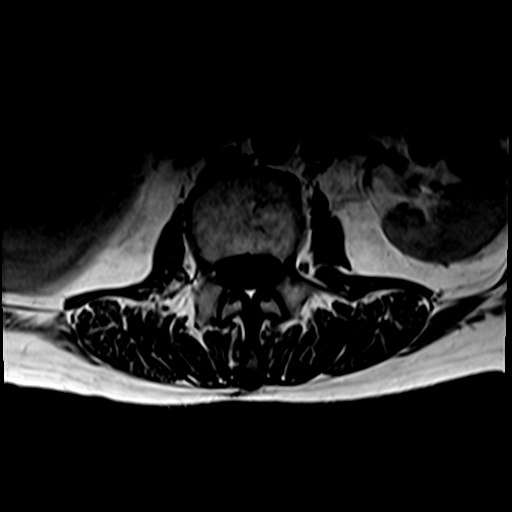
[im 32/38]
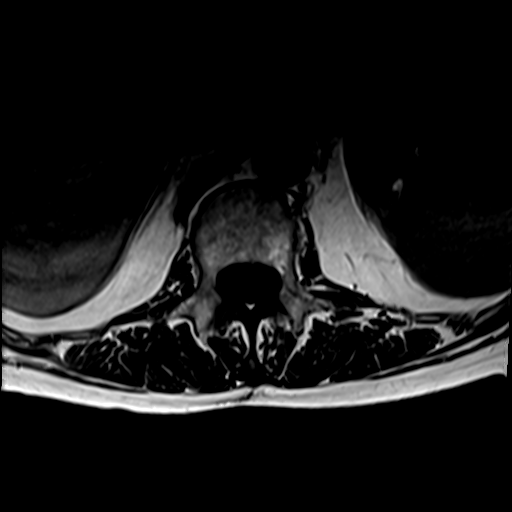
[im 38/38]
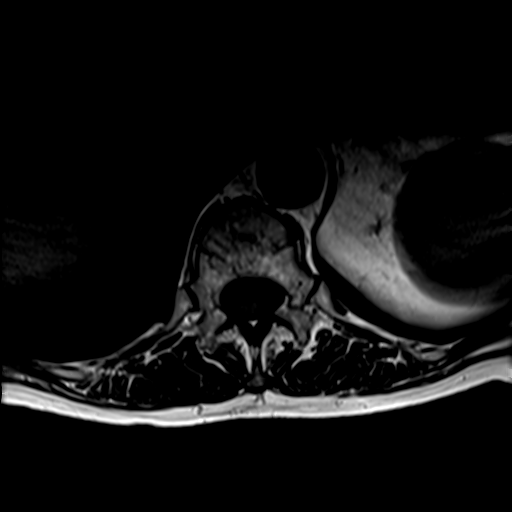

[31 of 48 positions shown; findings below may reference images not displayed]

FINDINGS: Segmentation: There are five lumbar type vertebral bodies. The last
full intervertebral disc space is labeled L5-S1.

Alignment: Straightening of the normal lumbar lordosis. Multilevel
degenerative spondylosis. The facets are normally aligned. No
definite pars defects.

Vertebrae:  No bone lesions or fractures.

Conus medullaris and cauda equina: Conus extends to the T12-L1
level. Conus and cauda equina appear normal.

Paraspinal and other soft tissues: No significant findings. There is
a large simple appearing left renal cyst noted.

Disc levels:

T12-L1: No significant findings.  Mild facet disease.

L1-2: Mild annular bulge with mild lateral recess encroachment
bilaterally. No significant spinal or foraminal stenosis.

L2-3: Bulging degenerated annulus, osteophytic ridging, short
pedicles, facet disease and ligamentum flavum thickening all
contributing to moderately severe to severe spinal and bilateral
lateral recess stenosis. Mild left foraminal stenosis.

L3-4: Bulging degenerated annulus, osteophytic ridging, short
pedicles, facet disease, ligamentum flavum thickening and a small
left-sided synovial cyst all contributing to severe spinal and
bilateral lateral recess stenosis. Mild foraminal encroachment
bilaterally but no significant stenosis.

L4-5: Severe disc disease and facet disease. Prior right-sided
laminectomy is noted. No significant spinal or foraminal stenosis.
There is right-sided spurring changes with mild lateral recess
stenosis potentially irritating the right L5 nerve root.

L5-S1: Severe disc disease and facet disease. Suspect prior
left-sided laminectomy. No significant spinal or foraminal stenosis.
IMPRESSION: 1. Degenerative lumbar spondylosis with multilevel disc disease and
facet disease.
2. Moderately severe to severe multifactorial spinal and bilateral
lateral recess stenosis at L2-3.
3. Severe multifactorial spinal and bilateral lateral recess
stenosis at L3-4. There is also mild bilateral foraminal
encroachment.
4. Prior right-sided laminectomy at L4-5. There is right-sided
spurring changes with mild lateral recess stenosis potentially
irritating the right L5 nerve root.
5. Severe disc disease and facet disease at L5-S1 but no significant
spinal or foraminal stenosis.

## 2020-02-17 NOTE — Telephone Encounter (Signed)
Patient has been notified that the low dose lung cancer screening CT scan is due currently or will be in near future.  Confirmed that patient is within the appropriate age range and asymptomatic, (no signs or symptoms of lung cancer).  Patient denies illness that would prevent curative treatment for lung cancer if found.  Patient is agreeable for CT scan being scheduled.    Verified smoking history (former smoker, quit 2017 with 52 year history).   CT scheduled for 03/01/20 @ 9:30

## 2020-02-18 NOTE — Telephone Encounter (Signed)
Smoking history: former, quit 2007, 52 pack year

## 2020-02-18 NOTE — Addendum Note (Signed)
Addended by: Jonne Ply on: 02/18/2020 12:01 PM   Modules accepted: Orders

## 2020-03-01 ENCOUNTER — Ambulatory Visit
Admission: RE | Admit: 2020-03-01 | Discharge: 2020-03-01 | Disposition: A | Discharge status: Home / Self Care | Payer: Medicare Other | Source: Ambulatory Visit | Attending: Oncology | Admitting: Oncology

## 2020-03-01 ENCOUNTER — Other Ambulatory Visit: Payer: Self-pay

## 2020-03-01 DIAGNOSIS — Z87891 Personal history of nicotine dependence: Secondary | ICD-10-CM | POA: Insufficient documentation

## 2020-03-01 DIAGNOSIS — Z122 Encounter for screening for malignant neoplasm of respiratory organs: Secondary | ICD-10-CM | POA: Insufficient documentation

## 2020-03-01 IMAGING — CT CT CHEST LUNG CANCER SCREENING LOW DOSE W/O CM
2 of 5 series · 15 of 40 positions shown, 18 images · non-contrast
Comparison: [DATE].

CLINICAL DATA: Former smoker, quit 4 years ago, 52 pack-year
history.

EXAM:
CT CHEST WITHOUT CONTRAST LOW-DOSE FOR LUNG CANCER SCREENING
TECHNIQUE: Multidetector CT imaging of the chest was performed following the
standard protocol without IV contrast.

[Series 3: lung 1.00 · axial · 0.68mm/px · z∈[-1221,-893]mm · 12 of 366 slices shown, 15 images]
[im 19/366  mediastinal]
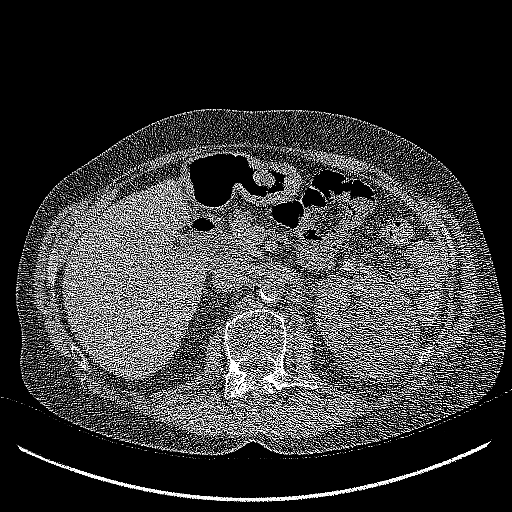
[im 19/366  lung]
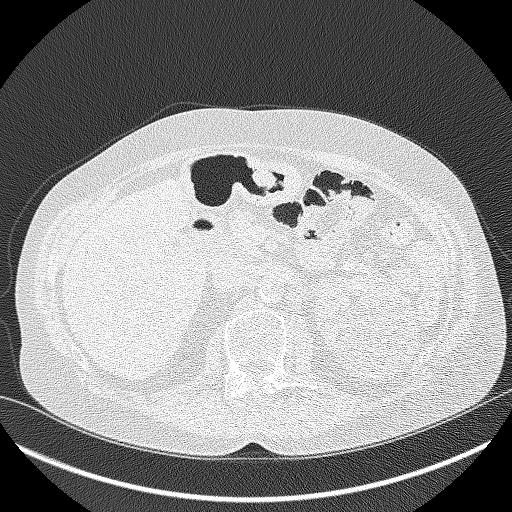
[im 55/366  lung]
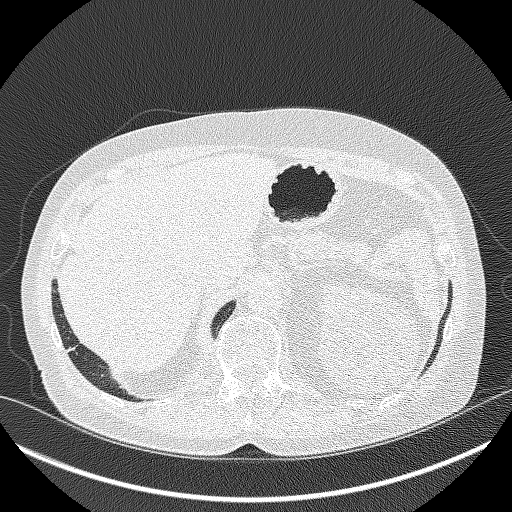
[im 74/366  lung]
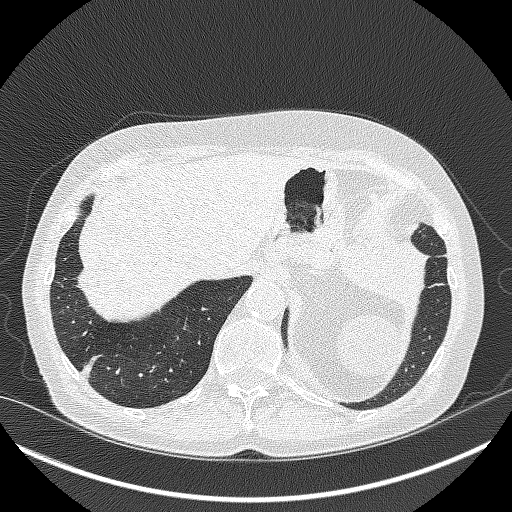
[im 110/366  lung]
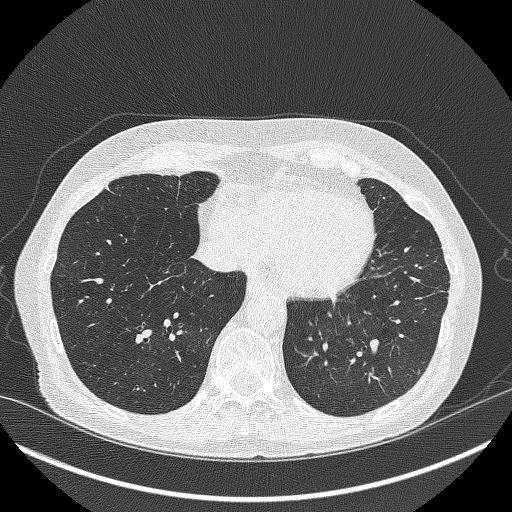
[im 147/366  mediastinal]
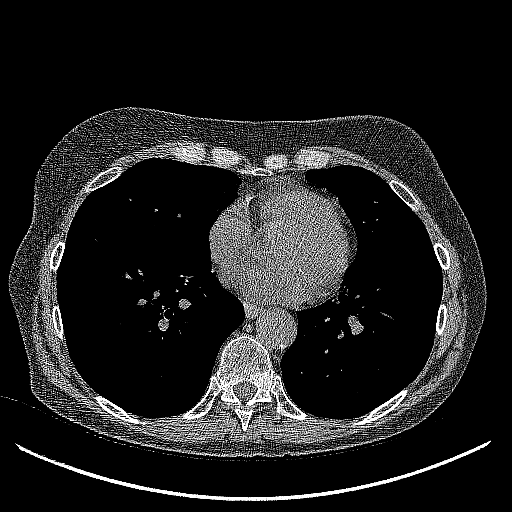
[im 147/366  lung]
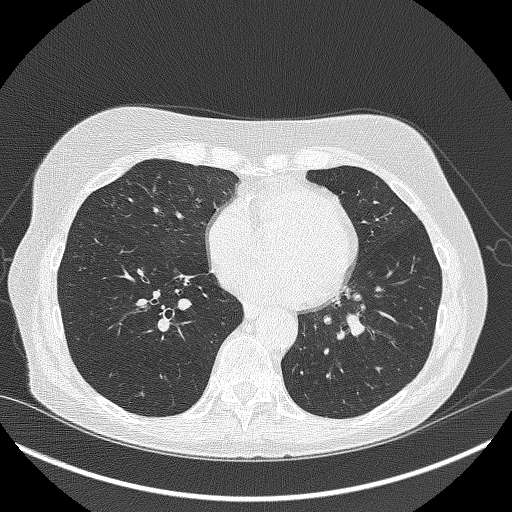
[im 165/366  lung]
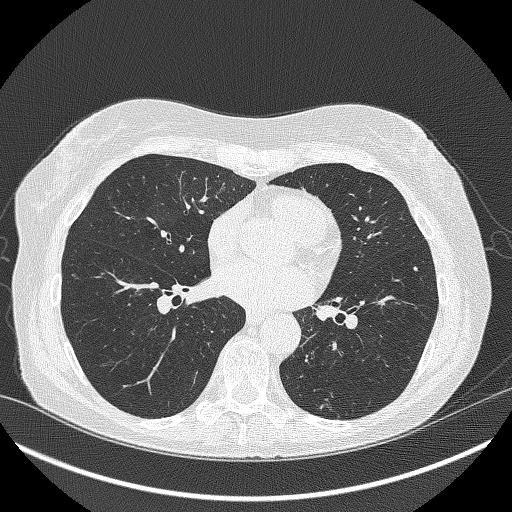
[im 201/366  lung]
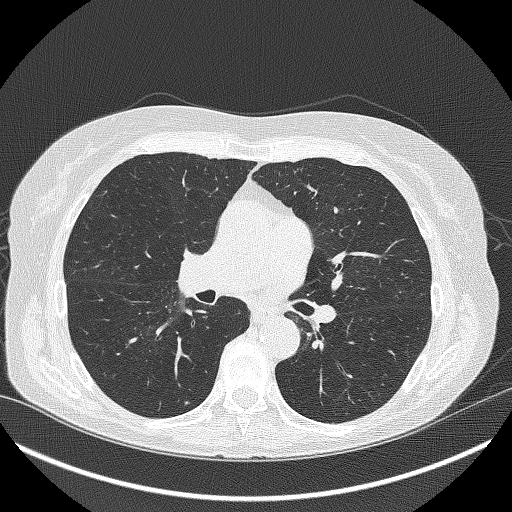
[im 220/366  lung]
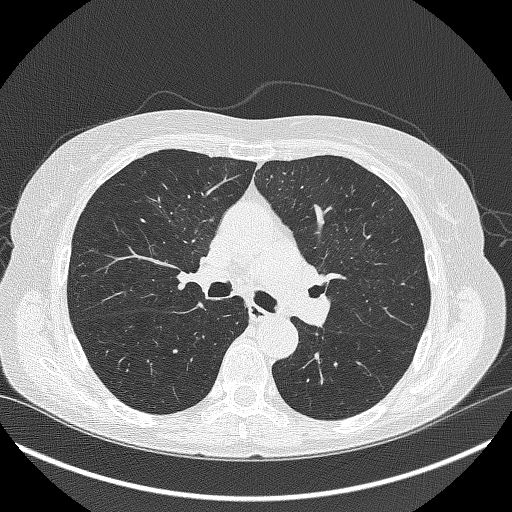
[im 256/366  mediastinal]
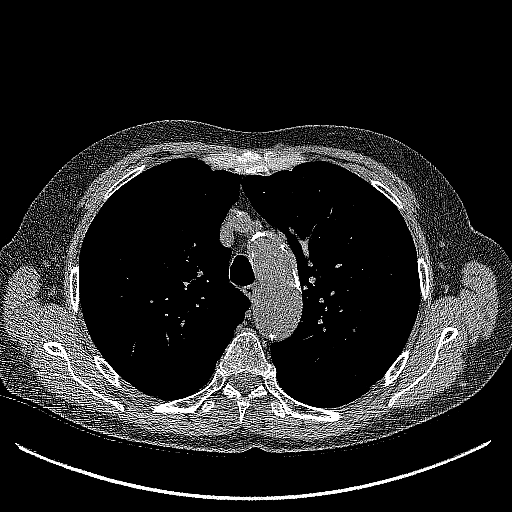
[im 256/366  lung]
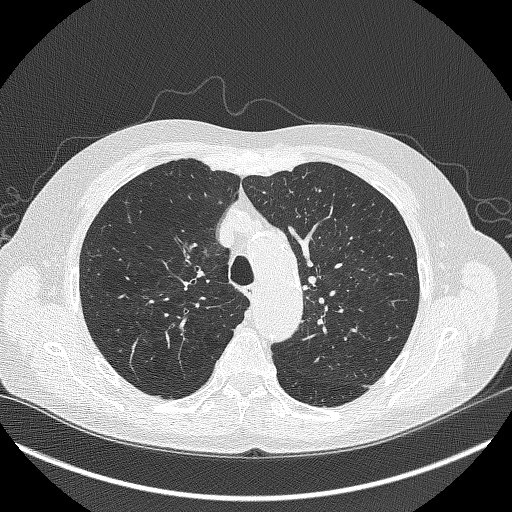
[im 293/366  lung]
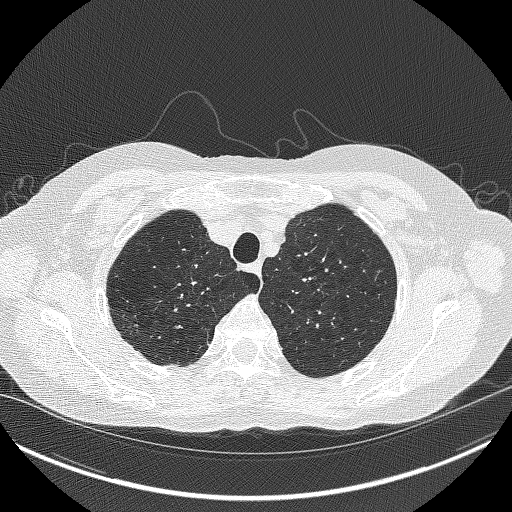
[im 311/366  lung]
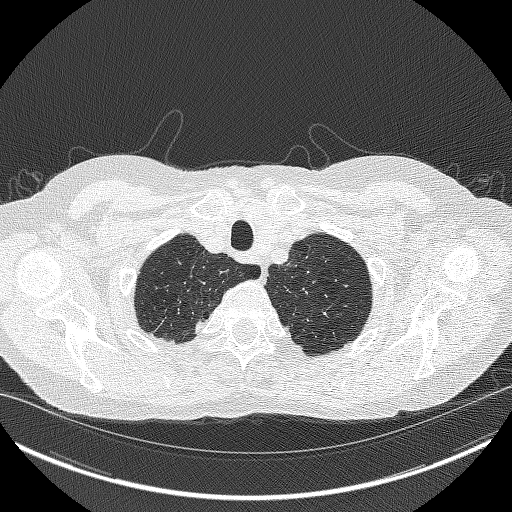
[im 347/366  lung]
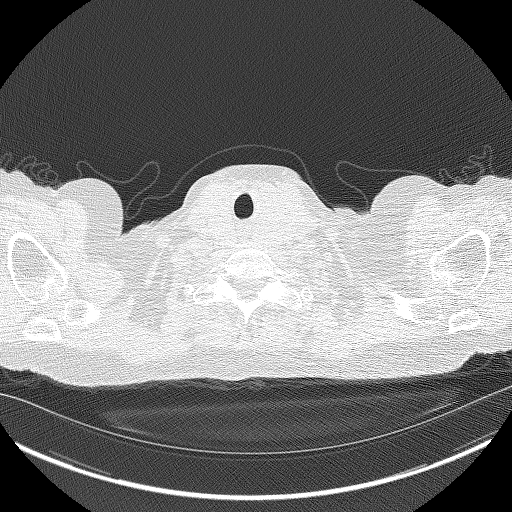

[Series 4: coronals lung 1.00 cor · coronal · 0.68mm/px · 3 of 305 slices shown]
[im 61/305  lung]
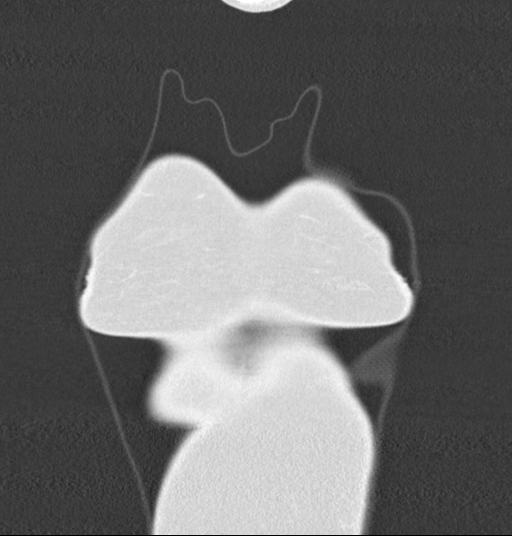
[im 122/305  lung]
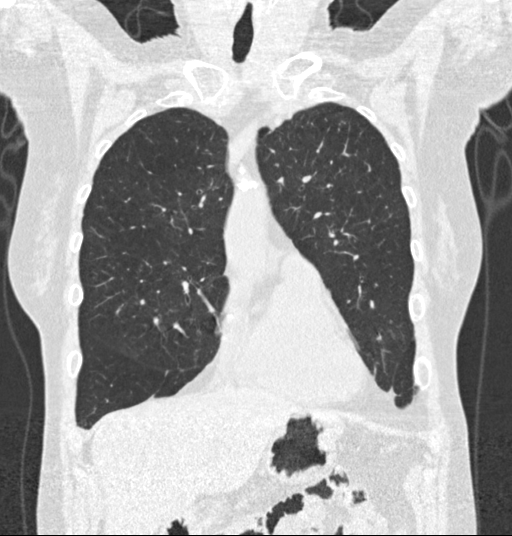
[im 183/305  lung]
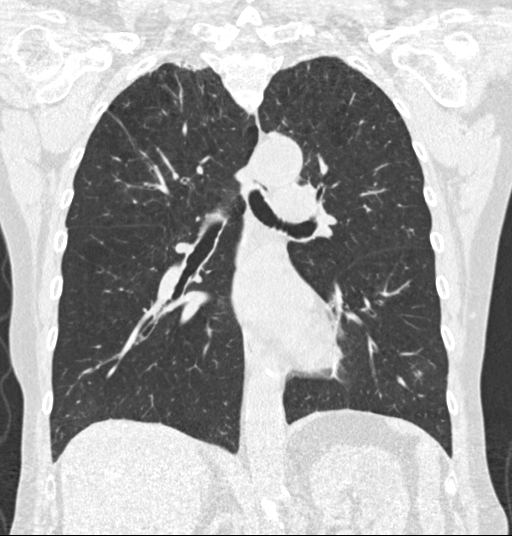

[15 of 40 positions shown; findings below may reference images not displayed]

FINDINGS: Cardiovascular: Atherosclerotic calcification of the aorta, aortic
valve and coronary arteries. Heart size normal. No pericardial
effusion.

Mediastinum/Nodes: Asymmetric enlargement of the left lobe of the
thyroid, as before. Slight rightward mass effect on the trachea as a
result. No pathologically enlarged mediastinal or axillary lymph
nodes. Hilar regions are difficult to definitively evaluate without
IV contrast. Esophagus is grossly unremarkable.

Lungs/Pleura: Biapical pleuroparenchymal scarring. Centrilobular and
paraseptal emphysema. There is a new 5.5 mm nodule in the posterior
segment right upper lobe (3/125). There are a few scattered new
pulmonary nodules measuring less than 4 mm in size. Additional
pulmonary nodules are unchanged. No pleural fluid. Airway is
unremarkable.

Upper Abdomen: Visualized portions of the liver, adrenal glands and
right kidney are unremarkable. A stone is seen in the left kidney.
Low-attenuation mass in the left kidney measures 7.6 cm is likely a
cyst. Visualized portions of the pancreas, stomach and bowel are
grossly unremarkable. Cholecystectomy.

Musculoskeletal: No worrisome lytic or sclerotic lesions.
IMPRESSION: 1. New 5.5 mm posterior segment right upper lobe nodule. Lung-RADS
3, probably benign findings. Short-term follow-up in 6 months is
recommended with repeat low-dose chest CT without contrast (please
use the following order, "CT CHEST LCS NODULE FOLLOW-UP W/O CM").
These results will be called to the ordering clinician or
representative by the Radiologist Assistant, and communication
documented in the PACS or [REDACTED].
2. Left renal stone.
3. Aortic atherosclerosis ([GI]-[GI]). Coronary artery
calcification.
4.  Emphysema ([GI]-[GI]).

## 2020-03-08 ENCOUNTER — Telehealth: Payer: Self-pay | Admitting: *Deleted

## 2020-03-08 NOTE — Telephone Encounter (Signed)
Notified patient of LDCT lung cancer screening program results with recommendation for 6 month follow up imaging. Also notified of incidental findings noted below and is encouraged to discuss further with PCP who will receive a copy of this note and/or the CT report. Patient verbalizes understanding.   IMPRESSION: 1. New 5.5 mm posterior segment right upper lobe nodule. Lung-RADS 3, probably benign findings. Short-term follow-up in 6 months is recommended with repeat low-dose chest CT without contrast (please use the following order, "CT CHEST LCS NODULE FOLLOW-UP W/O CM"). These results will be called to the ordering clinician or representative by the Radiologist Assistant, and communication documented in the PACS or [employer]. 2. Left renal stone. 3. Aortic atherosclerosis (ICD10-I70.0). Coronary artery calcification. 4.  Emphysema (ICD10-J43.9).

## 2020-06-05 ENCOUNTER — Other Ambulatory Visit: Payer: Self-pay | Admitting: Neurosurgery

## 2020-06-16 ENCOUNTER — Encounter
Admission: RE | Admit: 2020-06-16 | Discharge: 2020-06-16 | Disposition: A | Discharge status: Home / Self Care | Payer: Medicare Other | Source: Ambulatory Visit | Attending: Neurosurgery | Admitting: Neurosurgery

## 2020-06-16 ENCOUNTER — Other Ambulatory Visit: Payer: Self-pay

## 2020-06-16 ENCOUNTER — Encounter: Payer: Self-pay | Admitting: Neurosurgery

## 2020-06-16 DIAGNOSIS — I10 Essential (primary) hypertension: Secondary | ICD-10-CM | POA: Diagnosis present

## 2020-06-16 DIAGNOSIS — I493 Ventricular premature depolarization: Secondary | ICD-10-CM | POA: Insufficient documentation

## 2020-06-16 LAB — BASIC METABOLIC PANEL
Anion gap: 12 (ref 5–15)
BUN: 15 mg/dL (ref 8–23)
CO2: 28 mmol/L (ref 22–32)
Calcium: 9.6 mg/dL (ref 8.9–10.3)
Chloride: 99 mmol/L (ref 98–111)
Creatinine, Ser: 0.74 mg/dL (ref 0.44–1.00)
GFR, Estimated: >60 mL/min (ref >60)
Glucose, Bld: 88 mg/dL (ref 70–99)
Potassium: 3.2 mmol/L — ABNORMAL LOW (ref 3.5–5.1)
Sodium: 139 mmol/L (ref 135–145)

## 2020-06-16 LAB — URINALYSIS, ROUTINE W REFLEX MICROSCOPIC
Bilirubin Urine: NEGATIVE
Glucose, UA: NEGATIVE mg/dL
Hgb urine dipstick: NEGATIVE
Ketones, ur: 5 mg/dL — AB
Leukocytes,Ua: NEGATIVE
Nitrite: NEGATIVE
Protein, ur: NEGATIVE mg/dL
Specific Gravity, Urine: 1.02 (ref 1.005–1.030)
pH: 5 (ref 5.0–8.0)

## 2020-06-16 LAB — CBC
HCT: 45.4 % (ref 36.0–46.0)
Hemoglobin: 14.7 g/dL (ref 12.0–15.0)
MCH: 30.3 pg (ref 26.0–34.0)
MCHC: 32.4 g/dL (ref 30.0–36.0)
MCV: 93.6 fL (ref 80.0–100.0)
Platelets: 363 10*3/uL (ref 150–400)
RBC: 4.85 MIL/uL (ref 3.87–5.11)
RDW: 14.5 % (ref 11.5–15.5)
WBC: 9.8 10*3/uL (ref 4.0–10.5)
nRBC: 0 % (ref 0.0–0.2)

## 2020-06-16 LAB — APTT: aPTT: 26 seconds (ref 24–36)

## 2020-06-16 LAB — TYPE AND SCREEN
ABO/RH(D): AB POS
Antibody Screen: NEGATIVE

## 2020-06-16 LAB — SURGICAL PCR SCREEN
MRSA, PCR: NEGATIVE
Staphylococcus aureus: NEGATIVE

## 2020-06-16 LAB — PROTIME-INR
INR: 0.9 (ref 0.8–1.2)
Prothrombin Time: 12.2 seconds (ref 11.4–15.2)

## 2020-06-16 NOTE — Patient Instructions (Signed)
Your procedure is scheduled on: Wed. 10/20 Report to Day Surgery. To find out your arrival time please call [phone] between 1PM - 3PM on Tues 10/19.  Remember: Instructions that are not followed completely may result in serious medical risk,  up to and including death, or upon the discretion of your surgeon and anesthesiologist your  surgery may need to be rescheduled.     _X__ 1. Do not eat food after midnight the night before your procedure.                 No chewing gum or hard candies. You may drink clear liquids up to 2 hours                 before you are scheduled to arrive for your surgery- DO not drink clear                 liquids within 2 hours of the start of your surgery.                 Clear Liquids include:  water, apple juice without pulp, clear Gatorade, G2 or                  Gatorade Zero (avoid Red/Purple/Blue), Black Coffee or Tea (Do not add                 anything to coffee or tea). _____2.   Complete the "Ensure Clear Pre-surgery Clear Carbohydrate Drink" provided to you, 2 hours before arrival. **If you       are diabetic you will be provided with an alternative drink, Gatorade Zero or G2.  __X__2.  On the morning of surgery brush your teeth with toothpaste and water, you                may rinse your mouth with mouthwash if you wish.  Do not swallow any toothpaste of mouthwash.     _X__ 3.  No Alcohol for 24 hours before or after surgery.   ___ 4.  Do Not Smoke or use e-cigarettes For 24 Hours Prior to Your Surgery.                 Do not use any chewable tobacco products for at least 6 hours prior to                 Surgery.  ___  5.  Do not use any recreational drugs (marijuana, cocaine, heroin, ecstasy, MDMA or other)                For at least one week prior to your surgery.  Combination of these drugs with anesthesia                May have life threatening results.  ____  6.  Bring all medications with you on the day of surgery if instructed.    __x__  7.  Notify your doctor if there is any change in your medical condition      (cold, fever, infections).     Do not wear jewelry, make-up, hairpins, clips or nail polish. Do not wear lotions, powders, or perfumes. You may wear deodorant. Do not shave 48 hours prior to surgery. Do not bring valuables to the hospital.    [facility] is not responsible for any belongings or valuables.  Contacts, dentures or bridgework may not be worn into surgery. Leave your suitcase in the car. After surgery  it may be brought to your room. For patients admitted to the hospital, discharge time is determined by your treatment team.   Patients discharged the day of surgery will not be allowed to drive home.   Make arrangements for someone to be with you for the first 24 hours of your Same Day Discharge.    Please read over the following fact sheets that you were given:   Incentive spirometer    _x___ Take these medicines the morning of surgery with A SIP OF WATER:    1. HYDROcodone-acetaminophen (NORCO/VICODIN) 5-325 MG tablet  2. pantoprazole (PROTONIX) 20 MG tablet night before and morning of surgery  3.   4.  5.  6.  ____ Fleet Enema (as directed)   _x___ Use CHG Soap (or wipes) as directed  ____ Use Benzoyl Peroxide Gel as instructed  __x__ Use inhalers on the day of surgery COMBIVENT RESPIMAT 20-100 MCG/ACT AERS respimat, Fluticasone-Umeclidin-Vilant (TRELEGY ELLIPTA) 100-62.5-25 MCG/INH AEPB  ____ Stop metformin 2 days prior to surgery    ____ Take 1/2 of usual insulin dose the night before surgery. No insulin the morning          of surgery.   __x__ Stop aspirin on 10/13  __x__ Stop Anti-inflammatories on ibuprofen aleve on 10/13   ____ Stop supplements until after surgery.    ____ Bring C-Pap to the hospital.   Doesn't use   If you have any questions regarding your pre-procedure instructions,  Please call Pre-admit Testing at [phone]  [facility] twice  daily

## 2020-06-16 NOTE — Progress Notes (Signed)
  [facility] Perioperative Services: Pre-Admission/Anesthesia Testing  Abnormal Lab Notification   Date: 06/16/20  Name: Jenny White MRN:   [id number]  Re: Abnormal labs noted during PAT appointment   Provider(s) Notified: Venetia Night, MD Notification mode: Routed and/or faxed via [facility]   ABNORMAL LAB VALUE(S): Lab Results  Component Value Date   K 3.2 (L) 06/16/2020   Notes: Mild hypokalemia. Patient is on a daily thiazide diuretic. Patient is scheduled for a L2-4 DECOMPRESSION, RIGHT L4-5 REDO MICRODISCECTOMY (N/A ) on 06/28/2020. Current K+ is not at a point where I feel like patient needs a supplement. I have sent her communication via MyChart with recommendations for increased K+ rich dietary intake between now and the day of surgery. Will have SDS recheck K+ on the day of surgery to ensure correction of currently noted derangement. This is a Personal assistant; no formal response is required.  Quentin Mulling, MSN, APRN, FNP-C, CEN [facility]  Peri-operative Services Nurse Practitioner Phone: [phone] 06/16/20 4:40 PM

## 2020-06-26 ENCOUNTER — Other Ambulatory Visit
Admission: RE | Admit: 2020-06-26 | Discharge: 2020-06-26 | Disposition: A | Discharge status: Home / Self Care | Payer: Medicare Other | Source: Ambulatory Visit | Attending: Neurosurgery | Admitting: Neurosurgery

## 2020-06-26 ENCOUNTER — Other Ambulatory Visit: Payer: Self-pay

## 2020-06-26 DIAGNOSIS — Z01818 Encounter for other preprocedural examination: Secondary | ICD-10-CM | POA: Diagnosis present

## 2020-06-26 DIAGNOSIS — Z20822 Contact with and (suspected) exposure to covid-19: Secondary | ICD-10-CM | POA: Diagnosis not present

## 2020-06-26 LAB — SARS CORONAVIRUS 2 (TAT 6-24 HRS): SARS Coronavirus 2: NEGATIVE

## 2020-06-27 MED ORDER — ORAL CARE MOUTH RINSE: 15.0000 mL | Freq: Once | OROMUCOSAL | Status: AC

## 2020-06-27 MED ORDER — LACTATED RINGERS IV SOLN: INTRAVENOUS | Status: DC

## 2020-06-27 MED ORDER — CHLORHEXIDINE GLUCONATE 0.12 % MT SOLN: 15.0000 mL | Freq: Once | OROMUCOSAL | Status: AC

## 2020-06-28 ENCOUNTER — Observation Stay
Admission: RE | Admit: 2020-06-28 | Discharge: 2020-06-29 | Disposition: A | Discharge status: Home / Self Care | Payer: Medicare Other | Attending: Neurosurgery | Admitting: Neurosurgery

## 2020-06-28 ENCOUNTER — Ambulatory Visit: Payer: Medicare Other | Admitting: Urgent Care

## 2020-06-28 ENCOUNTER — Encounter
Admission: RE | Disposition: A | Discharge status: Home / Self Care | Payer: Self-pay | Source: Home / Self Care | Attending: Neurosurgery

## 2020-06-28 ENCOUNTER — Encounter: Payer: Self-pay | Admitting: Neurosurgery

## 2020-06-28 ENCOUNTER — Other Ambulatory Visit: Payer: Self-pay

## 2020-06-28 ENCOUNTER — Ambulatory Visit: Payer: Medicare Other

## 2020-06-28 DIAGNOSIS — Z7982 Long term (current) use of aspirin: Secondary | ICD-10-CM | POA: Diagnosis not present

## 2020-06-28 DIAGNOSIS — Z87891 Personal history of nicotine dependence: Secondary | ICD-10-CM | POA: Insufficient documentation

## 2020-06-28 DIAGNOSIS — J449 Chronic obstructive pulmonary disease, unspecified: Secondary | ICD-10-CM | POA: Insufficient documentation

## 2020-06-28 DIAGNOSIS — Z79899 Other long term (current) drug therapy: Secondary | ICD-10-CM | POA: Insufficient documentation

## 2020-06-28 DIAGNOSIS — M48062 Spinal stenosis, lumbar region with neurogenic claudication: Secondary | ICD-10-CM | POA: Diagnosis not present

## 2020-06-28 DIAGNOSIS — M5416 Radiculopathy, lumbar region: Secondary | ICD-10-CM | POA: Insufficient documentation

## 2020-06-28 DIAGNOSIS — M48061 Spinal stenosis, lumbar region without neurogenic claudication: Secondary | ICD-10-CM | POA: Diagnosis present

## 2020-06-28 DIAGNOSIS — M545 Low back pain, unspecified: Secondary | ICD-10-CM | POA: Diagnosis present

## 2020-06-28 DIAGNOSIS — I10 Essential (primary) hypertension: Secondary | ICD-10-CM | POA: Diagnosis not present

## 2020-06-28 DIAGNOSIS — Z419 Encounter for procedure for purposes other than remedying health state, unspecified: Secondary | ICD-10-CM

## 2020-06-28 HISTORY — PX: LUMBAR LAMINECTOMY/DECOMPRESSION MICRODISCECTOMY: SHX5026

## 2020-06-28 HISTORY — DX: Essential (primary) hypertension: I10

## 2020-06-28 HISTORY — DX: Personal history of urinary calculi: Z87.442

## 2020-06-28 HISTORY — DX: Gastro-esophageal reflux disease without esophagitis: K21.9

## 2020-06-28 HISTORY — DX: Chronic obstructive pulmonary disease, unspecified: J44.9

## 2020-06-28 HISTORY — DX: Unspecified osteoarthritis, unspecified site: M19.90

## 2020-06-28 HISTORY — DX: Sleep apnea, unspecified: G47.30

## 2020-06-28 LAB — POCT I-STAT, CHEM 8
BUN: 15 mg/dL (ref 8–23)
Calcium, Ion: 1.27 mmol/L (ref 1.15–1.40)
Chloride: 99 mmol/L (ref 98–111)
Creatinine, Ser: 0.8 mg/dL (ref 0.44–1.00)
Glucose, Bld: 107 mg/dL — ABNORMAL HIGH (ref 70–99)
HCT: 49 % — ABNORMAL HIGH (ref 36.0–46.0)
Hemoglobin: 16.7 g/dL — ABNORMAL HIGH (ref 12.0–15.0)
Potassium: 3.6 mmol/L (ref 3.5–5.1)
Sodium: 140 mmol/L (ref 135–145)
TCO2: 29 mmol/L (ref 22–32)

## 2020-06-28 LAB — ABO/RH: ABO/RH(D): AB POS

## 2020-06-28 IMAGING — RF DG LUMBAR SPINE 2-3V
1 series · 7 of 7 positions shown · non-contrast
Comparison: MRI lumbar spine [DATE]

CLINICAL DATA: Back surgery

EXAM:
LUMBAR SPINE - 2-3 VIEW; DG C-ARM 1-60 MIN

[Series 1: dg x-ray · 0.20mm/px · 7 of 7 slices shown]
[im 1/7]
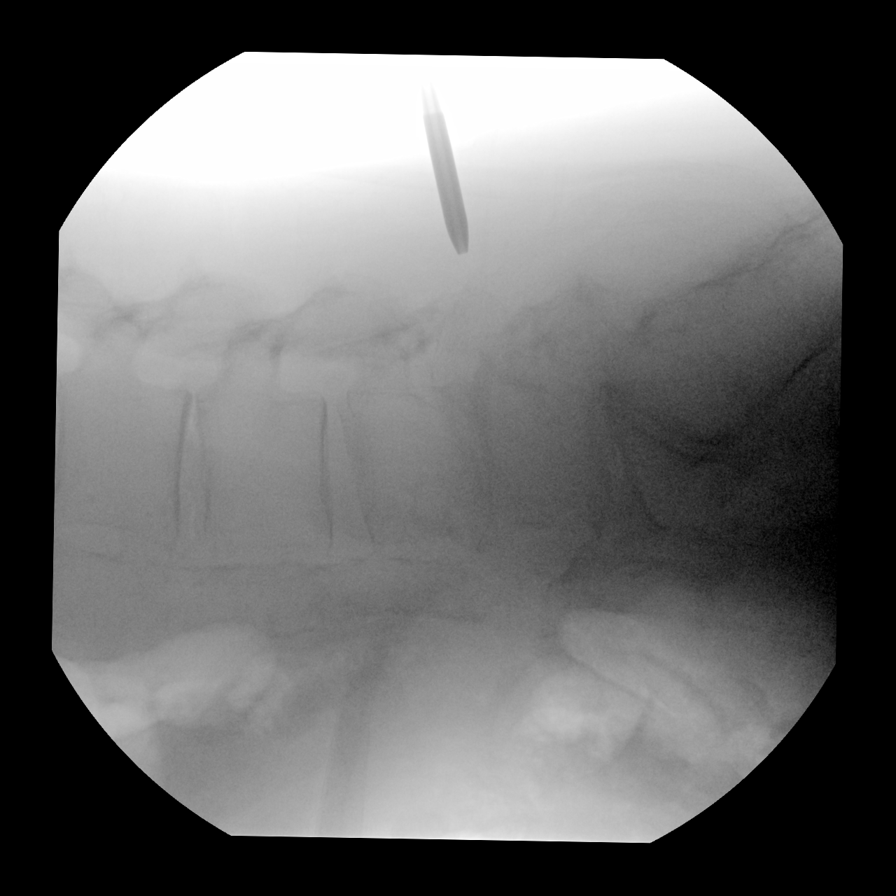
[im 2/7]
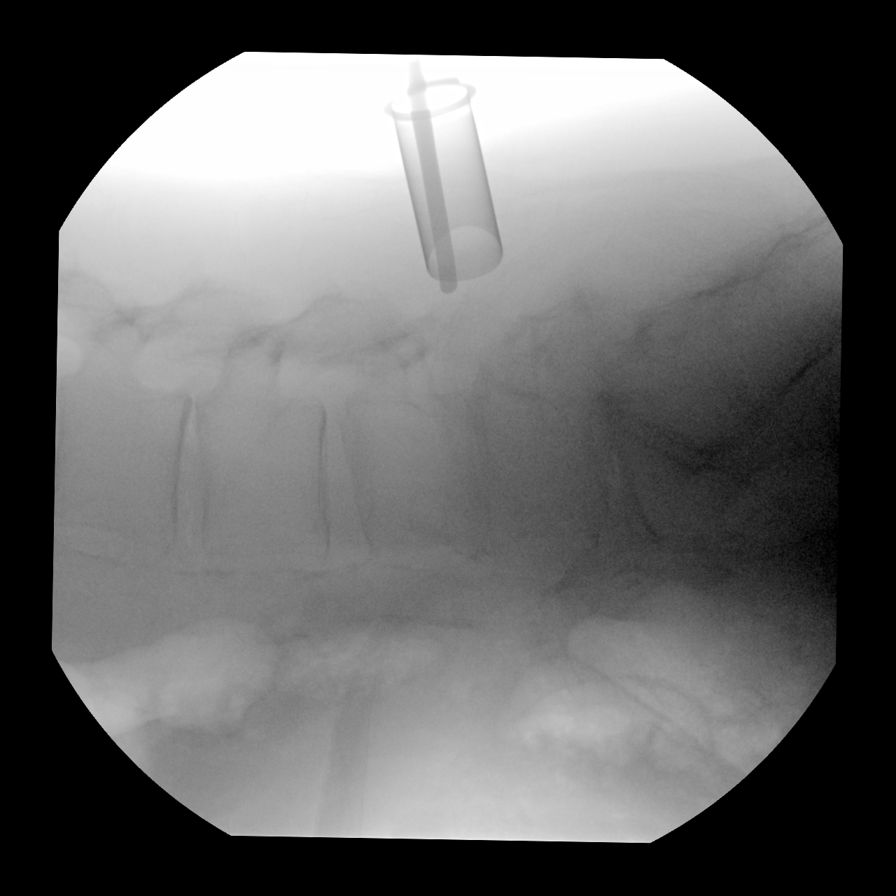
[im 3/7]
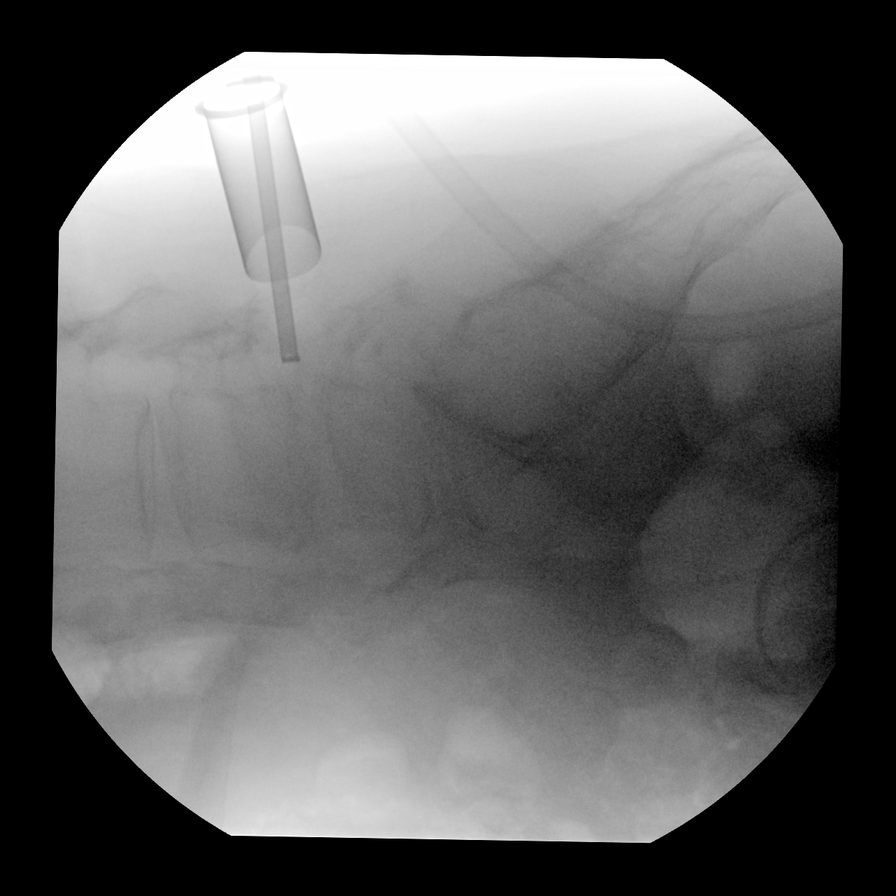
[im 4/7]
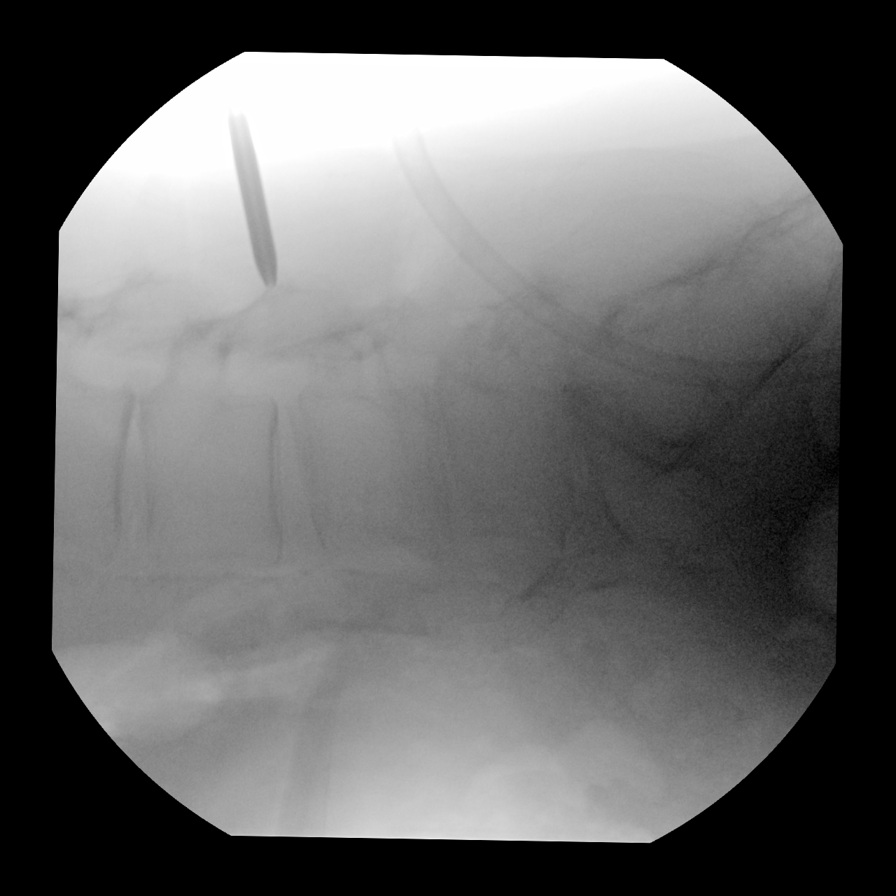
[im 5/7]
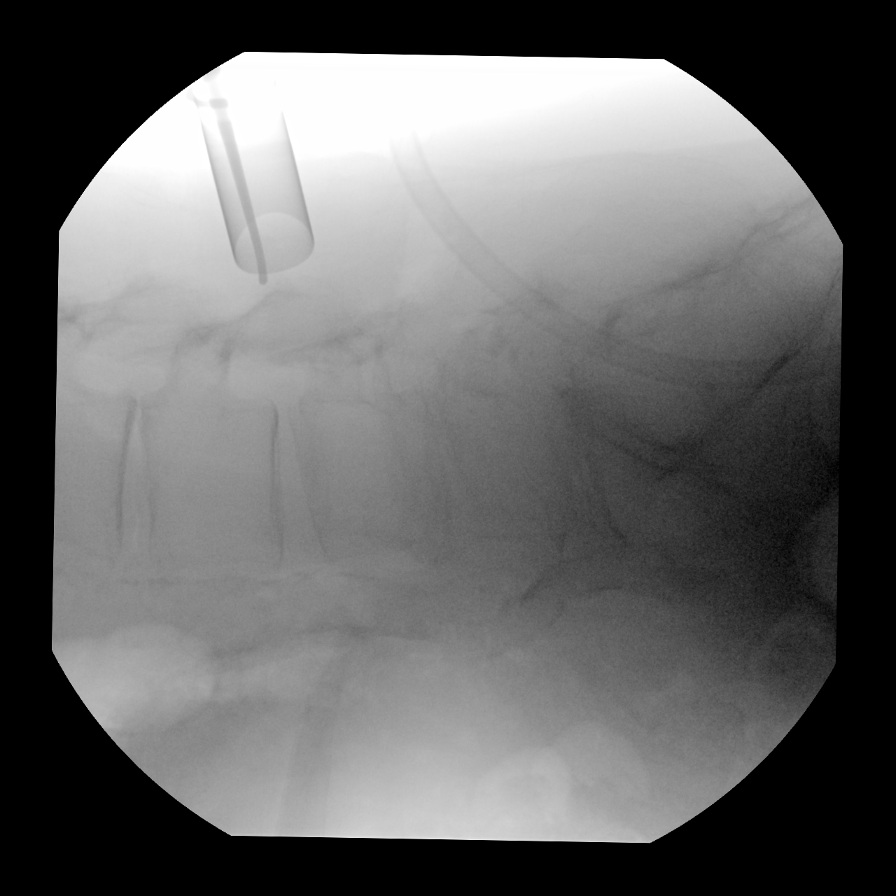
[im 6/7]
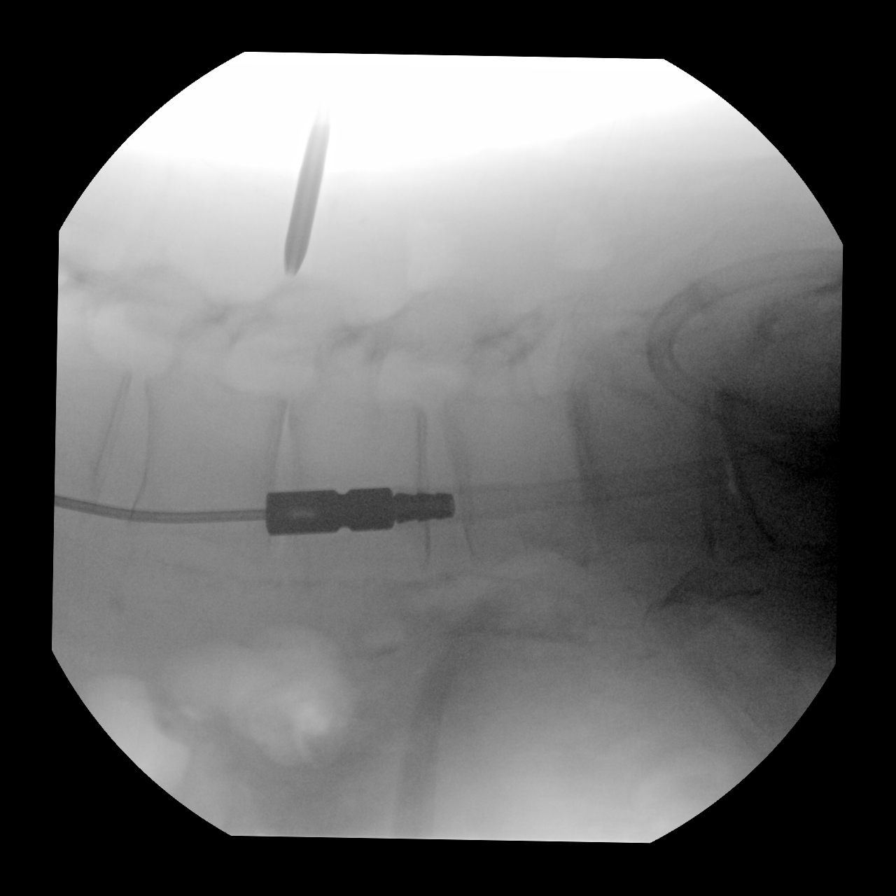
[im 7/7]
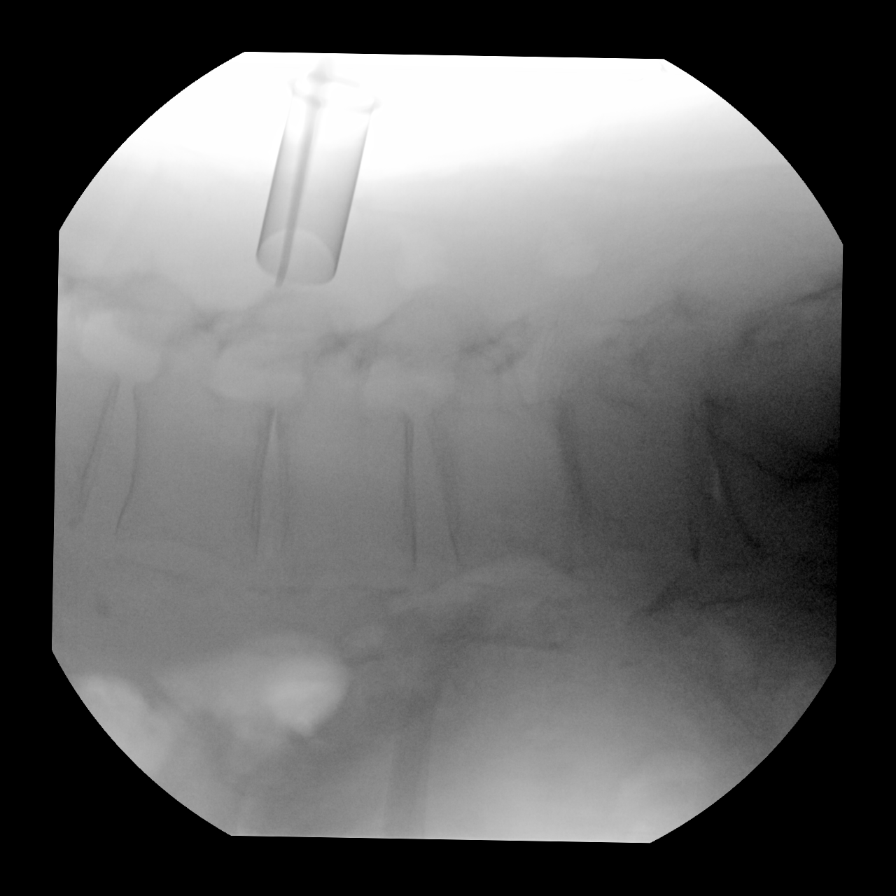

[7 of 7 positions shown; findings below may reference images not displayed]

FINDINGS: Lowest disc space L5-S1. Surgical instruments are present
posteriorly at the L4-5 disc level. Surgical instrument advanced to
the spinal canal at the L4-5 level.

Additional images demonstrate localization of the L2-3 and L3-4 disc
spaces.
IMPRESSION: Surgical localization L2-3, L3-4, L4-5 disc spaces.

## 2020-06-28 IMAGING — RF DG C-ARM 1-60 MIN
1 series · 7 of 7 positions shown · non-contrast
Comparison: MRI lumbar spine [DATE]

CLINICAL DATA: Back surgery

EXAM:
LUMBAR SPINE - 2-3 VIEW; DG C-ARM 1-60 MIN

[Series 1: dg x-ray · 0.20mm/px · 7 of 7 slices shown]
[im 1/7]
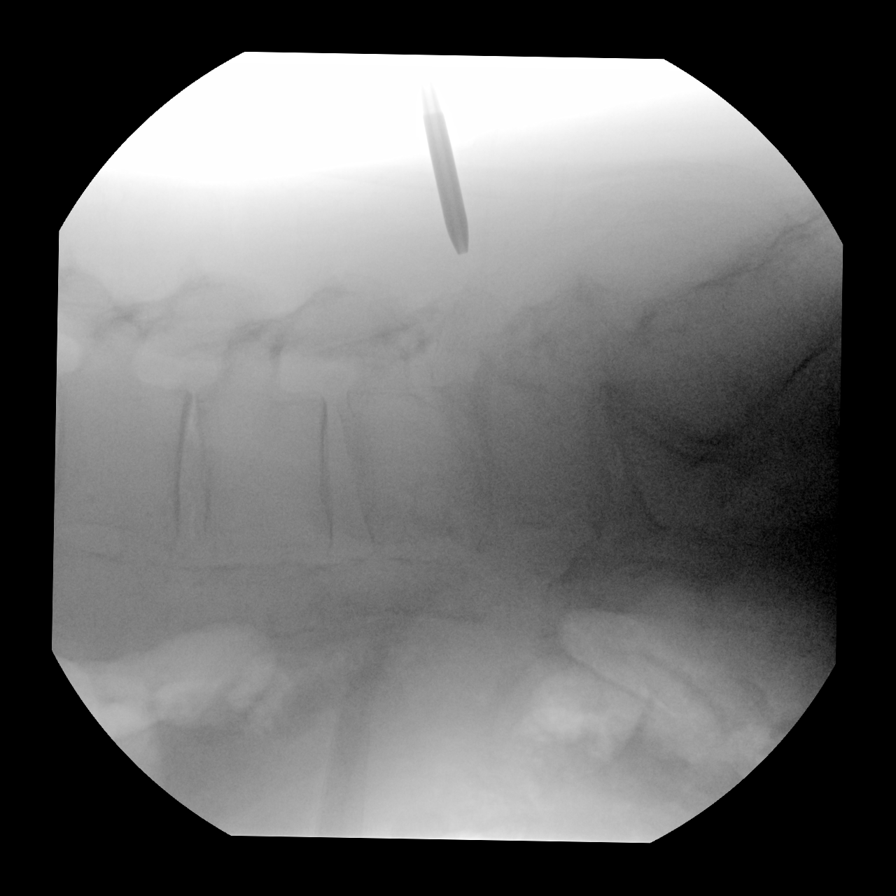
[im 2/7]
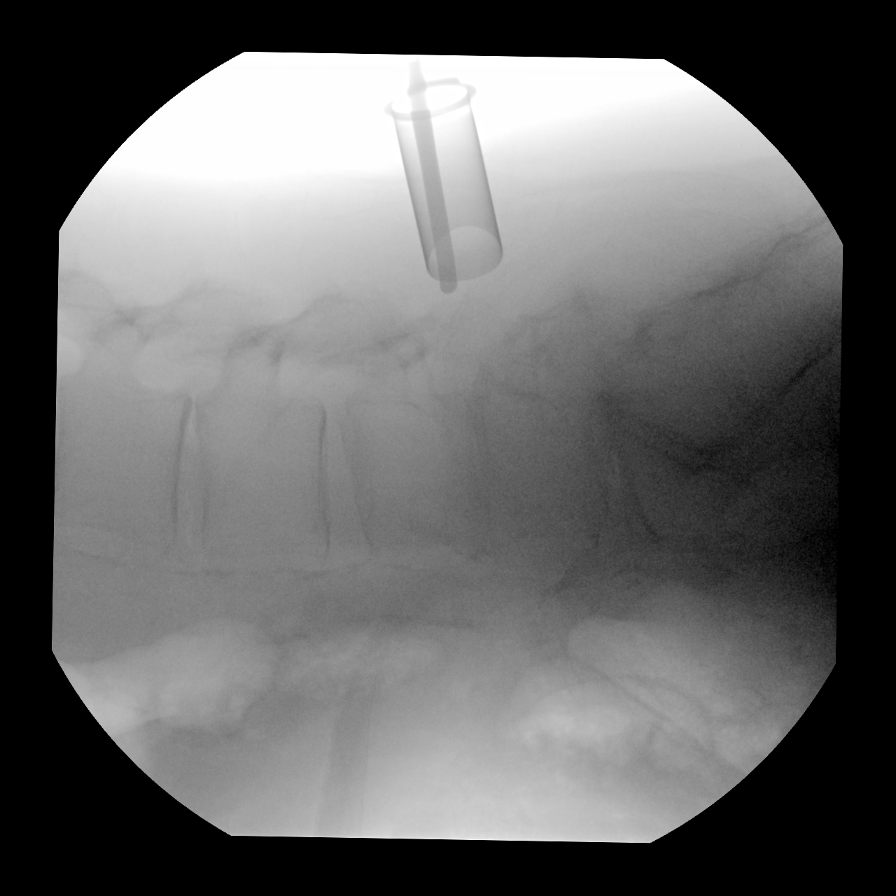
[im 3/7]
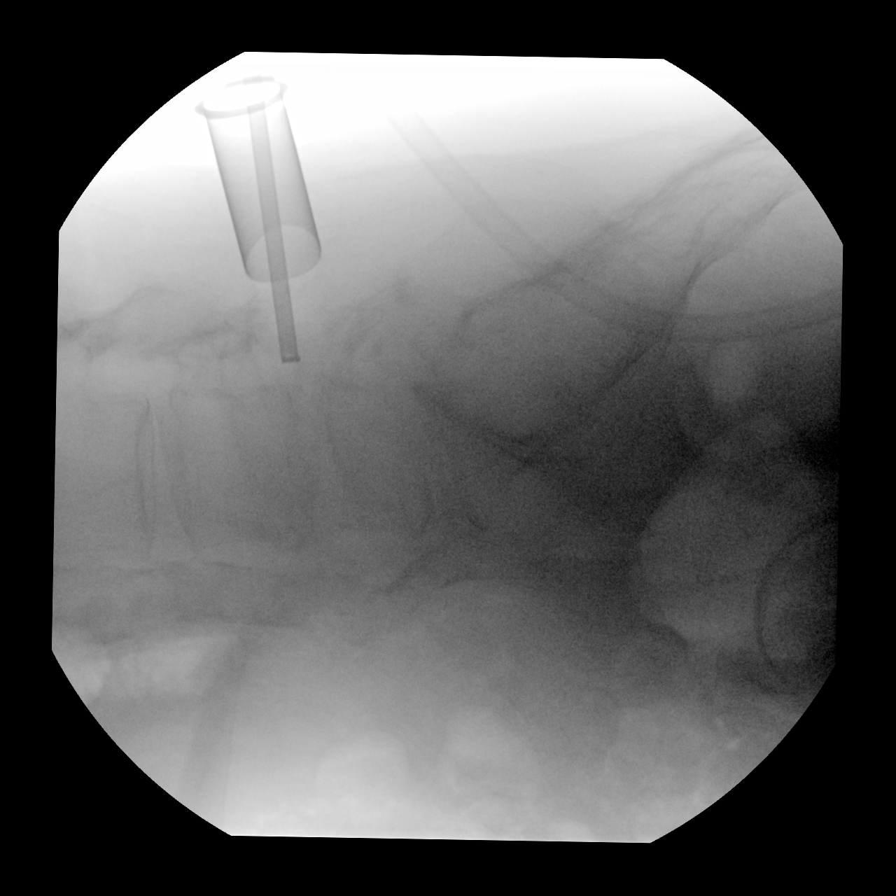
[im 4/7]
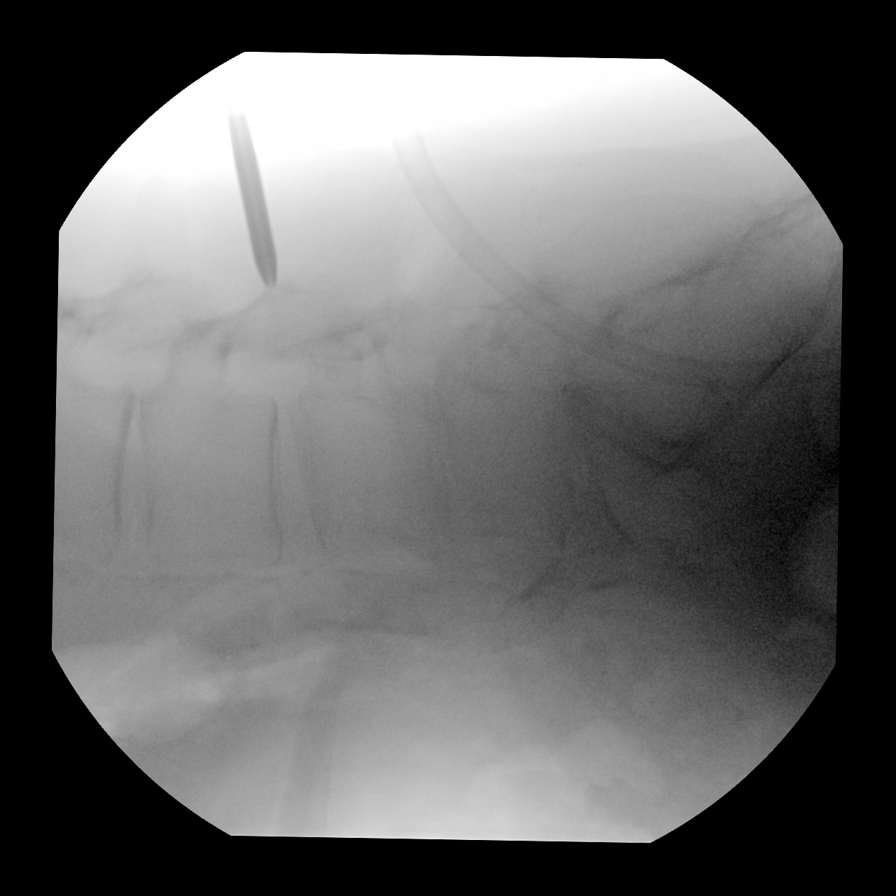
[im 5/7]
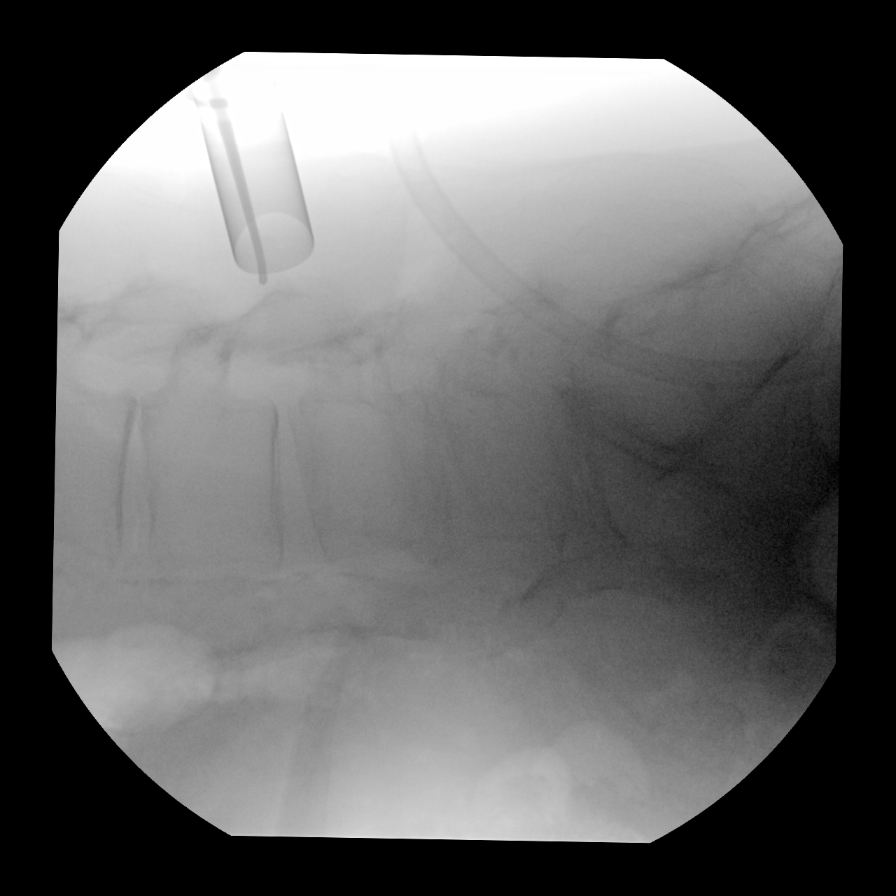
[im 6/7]
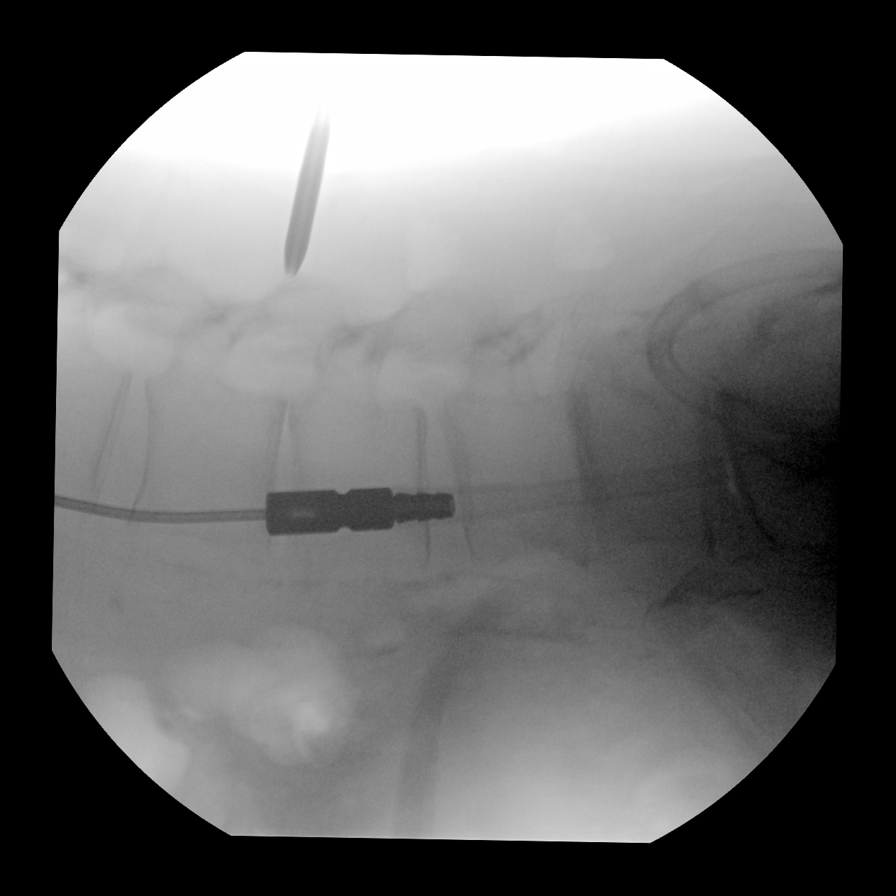
[im 7/7]
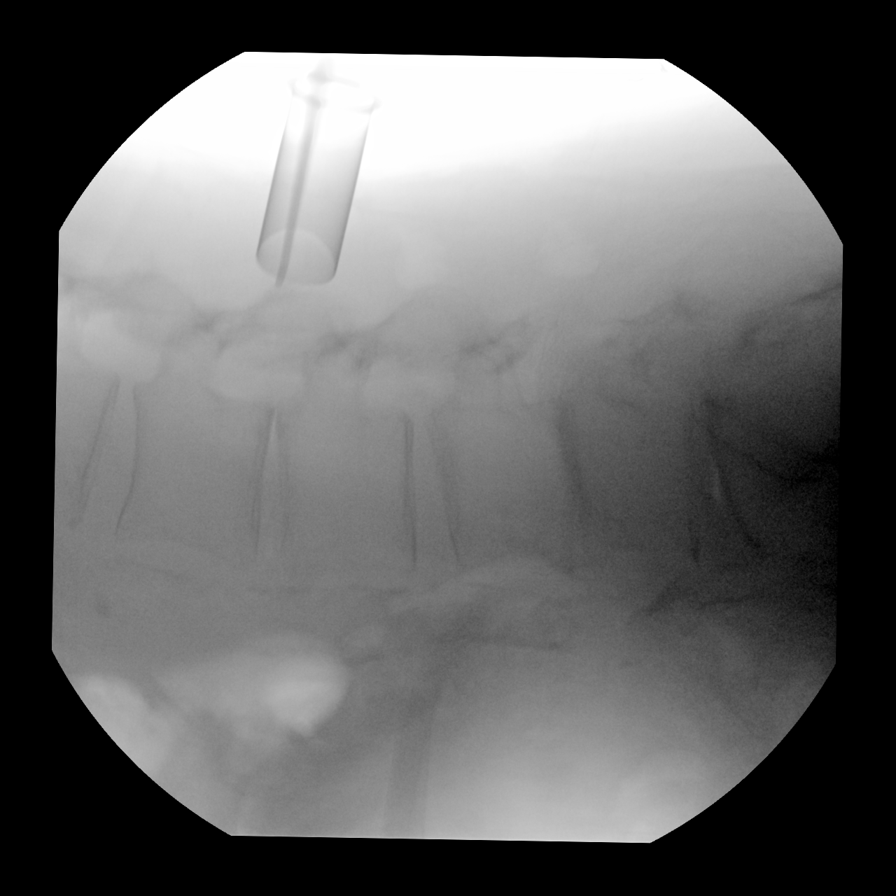

[7 of 7 positions shown; findings below may reference images not displayed]

FINDINGS: Lowest disc space L5-S1. Surgical instruments are present
posteriorly at the L4-5 disc level. Surgical instrument advanced to
the spinal canal at the L4-5 level.

Additional images demonstrate localization of the L2-3 and L3-4 disc
spaces.
IMPRESSION: Surgical localization L2-3, L3-4, L4-5 disc spaces.

## 2020-06-28 SURGERY — LUMBAR LAMINECTOMY/DECOMPRESSION MICRODISCECTOMY 3 LEVELS
Anesthesia: General

## 2020-06-28 MED ORDER — LIDOCAINE HCL (CARDIAC) PF 100 MG/5ML IV SOSY
PREFILLED_SYRINGE | INTRAVENOUS | Status: DC | PRN
  Administered 2020-06-28: 60 mg via INTRAVENOUS

## 2020-06-28 MED ORDER — KETAMINE HCL 50 MG/ML IJ SOLN
INTRAMUSCULAR | Status: DC | PRN
  Administered 2020-06-28: 50 mg via INTRAMUSCULAR

## 2020-06-28 MED ORDER — UMECLIDINIUM BROMIDE 62.5 MCG/INH IN AEPB
1.0000 | INHALATION_SPRAY | Freq: Every day | RESPIRATORY_TRACT | Status: DC
  Administered 2020-06-29: 1 via RESPIRATORY_TRACT
  Filled 2020-06-28: qty 7

## 2020-06-28 MED ORDER — PROPOFOL 10 MG/ML IV BOLUS
INTRAVENOUS | Status: AC
  Filled 2020-06-28: qty 20

## 2020-06-28 MED ORDER — LIDOCAINE HCL (PF) 2 % IJ SOLN
INTRAMUSCULAR | Status: AC
  Filled 2020-06-28: qty 5

## 2020-06-28 MED ORDER — PROPOFOL 10 MG/ML IV BOLUS
INTRAVENOUS | Status: DC | PRN
  Administered 2020-06-28: 150 mg via INTRAVENOUS
  Administered 2020-06-28: 50 mg via INTRAVENOUS

## 2020-06-28 MED ORDER — SUCCINYLCHOLINE CHLORIDE 20 MG/ML IJ SOLN
INTRAMUSCULAR | Status: DC | PRN
  Administered 2020-06-28: 100 mg via INTRAVENOUS

## 2020-06-28 MED ORDER — CHLORHEXIDINE GLUCONATE 0.12 % MT SOLN
OROMUCOSAL | Status: AC
  Administered 2020-06-28: 15 mL via OROMUCOSAL
  Filled 2020-06-28: qty 15

## 2020-06-28 MED ORDER — THROMBIN 5000 UNITS EX SOLR
CUTANEOUS | Status: DC | PRN
  Administered 2020-06-28: 5000 [IU] via TOPICAL

## 2020-06-28 MED ORDER — EPHEDRINE SULFATE 50 MG/ML IJ SOLN
INTRAMUSCULAR | Status: DC | PRN
  Administered 2020-06-28 (×2): 10 mg via INTRAVENOUS

## 2020-06-28 MED ORDER — PHENOL 1.4 % MT LIQD
1.0000 | OROMUCOSAL | Status: DC | PRN
  Filled 2020-06-28: qty 177

## 2020-06-28 MED ORDER — OXYCODONE HCL 5 MG PO TABS
ORAL_TABLET | ORAL | Status: AC
  Filled 2020-06-28: qty 1

## 2020-06-28 MED ORDER — METHOCARBAMOL 750 MG PO TABS
ORAL_TABLET | ORAL | Status: AC
  Administered 2020-06-28: 750 mg via ORAL
  Filled 2020-06-28: qty 1

## 2020-06-28 MED ORDER — DEXAMETHASONE SODIUM PHOSPHATE 10 MG/ML IJ SOLN
INTRAMUSCULAR | Status: AC
  Filled 2020-06-28: qty 1

## 2020-06-28 MED ORDER — MENTHOL 3 MG MT LOZG
1.0000 | LOZENGE | OROMUCOSAL | Status: DC | PRN
  Filled 2020-06-28: qty 9

## 2020-06-28 MED ORDER — ALUM & MAG HYDROXIDE-SIMETH 200-200-20 MG/5ML PO SUSP: 30.0000 mL | Freq: Four times a day (QID) | ORAL | Status: DC | PRN

## 2020-06-28 MED ORDER — MIDAZOLAM HCL 2 MG/2ML IJ SOLN
INTRAMUSCULAR | Status: AC
  Filled 2020-06-28: qty 2

## 2020-06-28 MED ORDER — ONDANSETRON HCL 4 MG/2ML IJ SOLN
INTRAMUSCULAR | Status: DC | PRN
  Administered 2020-06-28: 4 mg via INTRAVENOUS

## 2020-06-28 MED ORDER — ONDANSETRON HCL 4 MG PO TABS: 4.0000 mg | ORAL_TABLET | Freq: Four times a day (QID) | ORAL | Status: DC | PRN

## 2020-06-28 MED ORDER — MIDAZOLAM HCL 2 MG/2ML IJ SOLN
INTRAMUSCULAR | Status: DC | PRN
  Administered 2020-06-28 (×2): 1 mg via INTRAVENOUS

## 2020-06-28 MED ORDER — GABAPENTIN 100 MG PO CAPS
ORAL_CAPSULE | ORAL | Status: AC
  Administered 2020-06-28: 100 mg via ORAL
  Filled 2020-06-28: qty 1

## 2020-06-28 MED ORDER — LABETALOL HCL 5 MG/ML IV SOLN
INTRAVENOUS | Status: AC
  Filled 2020-06-28: qty 4

## 2020-06-28 MED ORDER — PHENYLEPHRINE HCL (PRESSORS) 10 MG/ML IV SOLN
INTRAVENOUS | Status: DC | PRN
  Administered 2020-06-28 (×4): 100 ug via INTRAVENOUS

## 2020-06-28 MED ORDER — KETAMINE HCL 50 MG/ML IJ SOLN
INTRAMUSCULAR | Status: AC
  Filled 2020-06-28: qty 10

## 2020-06-28 MED ORDER — BISACODYL 5 MG PO TBEC
5.0000 mg | DELAYED_RELEASE_TABLET | Freq: Every day | ORAL | Status: DC | PRN
  Filled 2020-06-28: qty 1

## 2020-06-28 MED ORDER — POLYETHYLENE GLYCOL 3350 17 G PO PACK
17.0000 g | PACK | Freq: Every day | ORAL | Status: DC | PRN
  Filled 2020-06-28: qty 1

## 2020-06-28 MED ORDER — SODIUM CHLORIDE 0.9% FLUSH: 3.0000 mL | INTRAVENOUS | Status: DC | PRN

## 2020-06-28 MED ORDER — HYDROCOD POLST-CPM POLST ER 10-8 MG/5ML PO SUER
5.0000 mL | Freq: Once | ORAL | Status: AC
  Administered 2020-06-28: 5 mL via ORAL
  Filled 2020-06-28: qty 5

## 2020-06-28 MED ORDER — LABETALOL HCL 5 MG/ML IV SOLN
5.0000 mg | INTRAVENOUS | Status: DC | PRN
  Administered 2020-06-28: 5 mg via INTRAVENOUS

## 2020-06-28 MED ORDER — ACETAMINOPHEN 500 MG PO TABS
ORAL_TABLET | ORAL | Status: AC
  Administered 2020-06-28: 1000 mg via ORAL
  Filled 2020-06-28: qty 2

## 2020-06-28 MED ORDER — OXYCODONE HCL 5 MG PO TABS
5.0000 mg | ORAL_TABLET | ORAL | Status: DC | PRN
  Administered 2020-06-28: 5 mg via ORAL

## 2020-06-28 MED ORDER — ENOXAPARIN SODIUM 40 MG/0.4ML ~~LOC~~ SOLN: 40.0000 mg | SUBCUTANEOUS | Status: DC

## 2020-06-28 MED ORDER — ALUM & MAG HYDROXIDE-SIMETH 200-200-20 MG/5ML PO SUSP
ORAL | Status: AC
  Administered 2020-06-28: 30 mL via ORAL
  Filled 2020-06-28: qty 30

## 2020-06-28 MED ORDER — FAMOTIDINE 20 MG PO TABS: 20.0000 mg | ORAL_TABLET | Freq: Every day | ORAL | Status: DC | PRN

## 2020-06-28 MED ORDER — SODIUM CHLORIDE 0.9 % IV SOLN
50.0000 mL/h | INTRAVENOUS | Status: DC
  Administered 2020-06-28 (×2): 50 mL/h via INTRAVENOUS

## 2020-06-28 MED ORDER — CEFAZOLIN SODIUM-DEXTROSE 2-4 GM/100ML-% IV SOLN
2.0000 g | INTRAVENOUS | Status: AC
  Administered 2020-06-28: 2 g via INTRAVENOUS

## 2020-06-28 MED ORDER — REMIFENTANIL HCL 1 MG IV SOLR
INTRAVENOUS | Status: AC
  Filled 2020-06-28: qty 1000

## 2020-06-28 MED ORDER — DOCUSATE SODIUM 100 MG PO CAPS
100.0000 mg | ORAL_CAPSULE | Freq: Two times a day (BID) | ORAL | Status: DC
  Administered 2020-06-28 – 2020-06-29 (×2): 100 mg via ORAL
  Filled 2020-06-28 (×3): qty 1

## 2020-06-28 MED ORDER — FENTANYL CITRATE (PF) 100 MCG/2ML IJ SOLN
25.0000 ug | INTRAMUSCULAR | Status: DC | PRN
  Administered 2020-06-28: 25 ug via INTRAVENOUS

## 2020-06-28 MED ORDER — SODIUM CHLORIDE (PF) 0.9 % IJ SOLN
INTRAMUSCULAR | Status: AC
  Filled 2020-06-28: qty 40

## 2020-06-28 MED ORDER — HYDROMORPHONE HCL 1 MG/ML IJ SOLN
0.5000 mg | INTRAMUSCULAR | Status: AC | PRN
  Administered 2020-06-28: 0.5 mg via INTRAVENOUS

## 2020-06-28 MED ORDER — BUPIVACAINE-EPINEPHRINE (PF) 0.5% -1:200000 IJ SOLN
INTRAMUSCULAR | Status: DC | PRN
  Administered 2020-06-28: 20 mL
  Administered 2020-06-28: 7 mL

## 2020-06-28 MED ORDER — FIBRIN SEALANT 2 ML SINGLE DOSE KIT
PACK | CUTANEOUS | Status: DC | PRN
  Administered 2020-06-28: 2 mL via TOPICAL

## 2020-06-28 MED ORDER — PANTOPRAZOLE SODIUM 20 MG PO TBEC
20.0000 mg | DELAYED_RELEASE_TABLET | Freq: Every day | ORAL | Status: DC
  Administered 2020-06-29: 20 mg via ORAL
  Filled 2020-06-28: qty 1

## 2020-06-28 MED ORDER — FLUTICASONE FUROATE-VILANTEROL 100-25 MCG/INH IN AEPB
1.0000 | INHALATION_SPRAY | Freq: Every day | RESPIRATORY_TRACT | Status: DC
  Administered 2020-06-29: 11:00:00 1 via RESPIRATORY_TRACT
  Filled 2020-06-28: qty 28

## 2020-06-28 MED ORDER — MONTELUKAST SODIUM 10 MG PO TABS
10.0000 mg | ORAL_TABLET | Freq: Every day | ORAL | Status: DC
  Filled 2020-06-28: qty 1

## 2020-06-28 MED ORDER — CEFAZOLIN SODIUM-DEXTROSE 2-4 GM/100ML-% IV SOLN
INTRAVENOUS | Status: AC
  Filled 2020-06-28: qty 100

## 2020-06-28 MED ORDER — SUCCINYLCHOLINE CHLORIDE 200 MG/10ML IV SOSY
PREFILLED_SYRINGE | INTRAVENOUS | Status: AC
  Filled 2020-06-28: qty 10

## 2020-06-28 MED ORDER — HYDROMORPHONE HCL 1 MG/ML IJ SOLN
INTRAMUSCULAR | Status: AC
  Filled 2020-06-28: qty 0.5

## 2020-06-28 MED ORDER — FENTANYL CITRATE (PF) 100 MCG/2ML IJ SOLN
INTRAMUSCULAR | Status: AC
  Filled 2020-06-28: qty 2

## 2020-06-28 MED ORDER — OXYCODONE HCL 5 MG PO TABS
ORAL_TABLET | ORAL | Status: AC
  Administered 2020-06-28: 10 mg via ORAL
  Filled 2020-06-28: qty 2

## 2020-06-28 MED ORDER — SODIUM CHLORIDE 0.9 % IV SOLN
INTRAVENOUS | Status: DC | PRN
  Administered 2020-06-28: 40 mL

## 2020-06-28 MED ORDER — LISINOPRIL-HYDROCHLOROTHIAZIDE 10-12.5 MG PO TABS: 1.0000 | ORAL_TABLET | Freq: Every day | ORAL | Status: DC

## 2020-06-28 MED ORDER — EPHEDRINE 5 MG/ML INJ
INTRAVENOUS | Status: AC
  Filled 2020-06-28: qty 10

## 2020-06-28 MED ORDER — SODIUM CHLORIDE 0.9 % IV SOLN
INTRAVENOUS | Status: DC | PRN
  Administered 2020-06-28: 20 ug/min via INTRAVENOUS
  Administered 2020-06-28: 50 ug/min via INTRAVENOUS

## 2020-06-28 MED ORDER — METHOCARBAMOL 750 MG PO TABS
750.0000 mg | ORAL_TABLET | Freq: Three times a day (TID) | ORAL | Status: DC
  Administered 2020-06-29 (×2): 750 mg via ORAL

## 2020-06-28 MED ORDER — HYDROCHLOROTHIAZIDE 12.5 MG PO CAPS
12.5000 mg | ORAL_CAPSULE | Freq: Every day | ORAL | Status: DC
  Administered 2020-06-29: 12.5 mg via ORAL
  Filled 2020-06-28: qty 1

## 2020-06-28 MED ORDER — SENNA 8.6 MG PO TABS
2.0000 | ORAL_TABLET | Freq: Two times a day (BID) | ORAL | Status: DC
  Administered 2020-06-28 – 2020-06-29 (×2): 17.2 mg via ORAL
  Filled 2020-06-28 (×3): qty 2

## 2020-06-28 MED ORDER — LISINOPRIL 10 MG PO TABS
10.0000 mg | ORAL_TABLET | Freq: Every day | ORAL | Status: DC
  Administered 2020-06-29: 10 mg via ORAL
  Filled 2020-06-28: qty 1

## 2020-06-28 MED ORDER — GABAPENTIN 100 MG PO CAPS
100.0000 mg | ORAL_CAPSULE | Freq: Three times a day (TID) | ORAL | Status: DC
  Administered 2020-06-29: 100 mg via ORAL

## 2020-06-28 MED ORDER — ACETAMINOPHEN 500 MG PO TABS
1000.0000 mg | ORAL_TABLET | Freq: Three times a day (TID) | ORAL | Status: DC
  Administered 2020-06-29: 1000 mg via ORAL

## 2020-06-28 MED ORDER — ONDANSETRON HCL 4 MG/2ML IJ SOLN: 4.0000 mg | Freq: Four times a day (QID) | INTRAMUSCULAR | Status: DC | PRN

## 2020-06-28 MED ORDER — FENTANYL CITRATE (PF) 100 MCG/2ML IJ SOLN
INTRAMUSCULAR | Status: DC | PRN
  Administered 2020-06-28 (×2): 50 ug via INTRAVENOUS

## 2020-06-28 MED ORDER — FLEET ENEMA 7-19 GM/118ML RE ENEM: 1.0000 | ENEMA | Freq: Once | RECTAL | Status: DC | PRN

## 2020-06-28 MED ORDER — DEXAMETHASONE SODIUM PHOSPHATE 10 MG/ML IJ SOLN
INTRAMUSCULAR | Status: DC | PRN
  Administered 2020-06-28: 10 mg via INTRAVENOUS

## 2020-06-28 MED ORDER — ONDANSETRON HCL 4 MG/2ML IJ SOLN
INTRAMUSCULAR | Status: AC
  Filled 2020-06-28: qty 2

## 2020-06-28 MED ORDER — FLUTICASONE-UMECLIDIN-VILANT 100-62.5-25 MCG/INH IN AEPB: 1.0000 | INHALATION_SPRAY | Freq: Every day | RESPIRATORY_TRACT | Status: DC

## 2020-06-28 MED ORDER — ALBUMIN HUMAN 5 % IV SOLN: INTRAVENOUS | Status: DC | PRN

## 2020-06-28 MED ORDER — METHYLPREDNISOLONE ACETATE 40 MG/ML IJ SUSP
INTRAMUSCULAR | Status: DC | PRN
  Administered 2020-06-28: 40 mg

## 2020-06-28 MED ORDER — SODIUM CHLORIDE 0.9% FLUSH
3.0000 mL | Freq: Two times a day (BID) | INTRAVENOUS | Status: DC
  Administered 2020-06-28 – 2020-06-29 (×3): 3 mL via INTRAVENOUS

## 2020-06-28 MED ORDER — ONDANSETRON HCL 4 MG/2ML IJ SOLN: 4.0000 mg | Freq: Once | INTRAMUSCULAR | Status: DC | PRN

## 2020-06-28 MED ORDER — PHENYLEPHRINE HCL (PRESSORS) 10 MG/ML IV SOLN
INTRAVENOUS | Status: AC
  Filled 2020-06-28: qty 1

## 2020-06-28 MED ORDER — ACETAMINOPHEN 10 MG/ML IV SOLN
INTRAVENOUS | Status: AC
  Filled 2020-06-28: qty 100

## 2020-06-28 MED ORDER — FAMOTIDINE 20 MG PO TABS
ORAL_TABLET | ORAL | Status: AC
  Administered 2020-06-28: 20 mg via ORAL
  Filled 2020-06-28: qty 1

## 2020-06-28 MED ORDER — METHOCARBAMOL 1000 MG/10ML IJ SOLN
500.0000 mg | Freq: Three times a day (TID) | INTRAVENOUS | Status: DC
  Administered 2020-06-28: 500 mg via INTRAVENOUS
  Filled 2020-06-28: qty 5

## 2020-06-28 MED ORDER — ONDANSETRON HCL 4 MG/2ML IJ SOLN
INTRAMUSCULAR | Status: AC
  Administered 2020-06-28: 4 mg via INTRAVENOUS
  Filled 2020-06-28: qty 2

## 2020-06-28 MED ORDER — REMIFENTANIL HCL 1 MG IV SOLR
INTRAVENOUS | Status: DC | PRN
Start: 2020-06-28 — End: 2020-06-28
  Administered 2020-06-28: .1 ug/kg/min via INTRAVENOUS

## 2020-06-28 MED ORDER — OXYCODONE HCL 5 MG PO TABS
10.0000 mg | ORAL_TABLET | ORAL | Status: DC | PRN
  Administered 2020-06-28 – 2020-06-29 (×2): 10 mg via ORAL

## 2020-06-28 MED ORDER — IPRATROPIUM-ALBUTEROL 20-100 MCG/ACT IN AERS
1.0000 | INHALATION_SPRAY | Freq: Four times a day (QID) | RESPIRATORY_TRACT | Status: DC | PRN
  Filled 2020-06-28: qty 4

## 2020-06-28 MED ORDER — ACETAMINOPHEN 10 MG/ML IV SOLN
INTRAVENOUS | Status: DC | PRN
  Administered 2020-06-28: 1000 mg via INTRAVENOUS

## 2020-06-28 MED ORDER — OXYCODONE HCL 5 MG PO TABS
ORAL_TABLET | ORAL | Status: AC
  Filled 2020-06-28: qty 2

## 2020-06-28 SURGICAL SUPPLY — 55 items
BUR NEURO DRILL SOFT 3.0X3.8M (BURR) ×2 IMPLANT
CANISTER SUCT 1200ML W/VALVE (MISCELLANEOUS) IMPLANT
CHLORAPREP W/TINT 26 (MISCELLANEOUS) ×4 IMPLANT
CNTNR SPEC 2.5X3XGRAD LEK (MISCELLANEOUS) ×1
CONT SPEC 4OZ STER OR WHT (MISCELLANEOUS) ×1
CONT SPEC 4OZ STRL OR WHT (MISCELLANEOUS) ×1
CONTAINER SPEC 2.5X3XGRAD LEK (MISCELLANEOUS) ×1 IMPLANT
COUNTER NEEDLE 20/40 LG (NEEDLE) ×2 IMPLANT
COVER WAND RF STERILE (DRAPES) ×2 IMPLANT
CUP MEDICINE 2OZ PLAST GRAD ST (MISCELLANEOUS) ×4 IMPLANT
DERMABOND ADVANCED (GAUZE/BANDAGES/DRESSINGS) ×1
DERMABOND ADVANCED .7 DNX12 (GAUZE/BANDAGES/DRESSINGS) ×1 IMPLANT
DRAPE C-ARM 42X72 X-RAY (DRAPES) ×4 IMPLANT
DRAPE INCISE IOBAN 66X45 STRL (DRAPES) ×2 IMPLANT
DRAPE LAPAROTOMY 100X77 ABD (DRAPES) ×2 IMPLANT
DRAPE MICROSCOPE SPINE 48X150 (DRAPES) ×2 IMPLANT
DRAPE SURG 17X11 SM STRL (DRAPES) ×8 IMPLANT
DRSG OPSITE POSTOP 4X6 (GAUZE/BANDAGES/DRESSINGS) ×2 IMPLANT
ELECT CAUTERY BLADE TIP 2.5 (TIP) ×2
ELECT EZSTD 165MM 6.5IN (MISCELLANEOUS) ×2
ELECTRODE CAUTERY BLDE TIP 2.5 (TIP) ×1 IMPLANT
ELECTRODE EZSTD 165MM 6.5IN (MISCELLANEOUS) ×1 IMPLANT
GLOVE BIOGEL PI IND STRL 7.0 (GLOVE) ×1 IMPLANT
GLOVE BIOGEL PI INDICATOR 7.0 (GLOVE) ×1
GLOVE SURG SYN 7.0 (GLOVE) ×4 IMPLANT
GLOVE SURG SYN 8.5  E (GLOVE) ×6
GLOVE SURG SYN 8.5 E (GLOVE) ×3 IMPLANT
GOWN SRG XL LVL 3 NONREINFORCE (GOWNS) ×1 IMPLANT
GOWN STRL NON-REIN TWL XL LVL3 (GOWNS) ×2
GOWN STRL REUS W/ TWL XL LVL3 (GOWN DISPOSABLE) ×1 IMPLANT
GOWN STRL REUS W/TWL XL LVL3 (GOWN DISPOSABLE) ×2
GRADUATE 1200CC STRL 31836 (MISCELLANEOUS) ×2 IMPLANT
GRAFT DURAGEN MATRIX 1WX1L (Tissue) ×2 IMPLANT
KIT SPINAL PRONEVIEW (KITS) ×2 IMPLANT
KNIFE BAYONET SHORT DISCETOMY (MISCELLANEOUS) IMPLANT
MARKER SKIN DUAL TIP RULER LAB (MISCELLANEOUS) ×2 IMPLANT
NDL SAFETY ECLIPSE 18X1.5 (NEEDLE) IMPLANT
NEEDLE HYPO 18GX1.5 SHARP (NEEDLE)
NEEDLE HYPO 22GX1.5 SAFETY (NEEDLE) ×2 IMPLANT
NS IRRIG 1000ML POUR BTL (IV SOLUTION) ×2 IMPLANT
PACK LAMINECTOMY NEURO (CUSTOM PROCEDURE TRAY) ×2 IMPLANT
PAD ARMBOARD 7.5X6 YLW CONV (MISCELLANEOUS) ×2 IMPLANT
SPOGE SURGIFLO 8M (HEMOSTASIS) ×2
SPONGE SURGIFLO 8M (HEMOSTASIS) ×1 IMPLANT
SUT DVC VLOC 3-0 CL 6 P-12 (SUTURE) ×2 IMPLANT
SUT ETHILON 3-0 FS-10 30 BLK (SUTURE) ×2
SUT VIC AB 0 CT1 27 (SUTURE) ×2
SUT VIC AB 0 CT1 27XCR 8 STRN (SUTURE) ×1 IMPLANT
SUT VIC AB 2-0 CT1 18 (SUTURE) ×4 IMPLANT
SUTURE EHLN 3-0 FS-10 30 BLK (SUTURE) ×1 IMPLANT
SYR 20ML LL LF (SYRINGE) ×2 IMPLANT
SYR 30ML LL (SYRINGE) ×4 IMPLANT
SYR 3ML LL SCALE MARK (SYRINGE) ×2 IMPLANT
TOWEL OR 17X26 4PK STRL BLUE (TOWEL DISPOSABLE) ×8 IMPLANT
TUBING CONNECTING 10 (TUBING) ×2 IMPLANT

## 2020-06-28 NOTE — Op Note (Signed)
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   Preoperative Note:   Risks of surgery discussed include: infection, bleeding, stroke, coma, death, paralysis, CSF leak, nerve/spinal cord injury, numbness, tingling, weakness, complex regional pain syndrome, recurrent stenosis and/or disc herniation, vascular injury, development of instability, neck/back pain, need for further surgery, persistent symptoms, development of deformity, and the risks of anesthesia. The patient understood these risks and agreed to proceed.  Operative Note:   1) right L4/5 redo microdiscectomy 2) L2-3 and L3-4 lumbar decompression  The patient was then brought from the preoperative center with intravenous access established.  The patient underwent general anesthesia and endotracheal tube intubation, and was then rotated on the [place] rail top where all pressure points were appropriately padded.  The skin was then thoroughly cleansed.  Perioperative antibiotic prophylaxis was administered.  Sterile prep and drapes were then applied and a timeout was then observed.  C-arm was brought into the field under sterile conditions, and the L4-5 disc space identified and marked with an incision along the prior midline incision and extended to the L2-3 level.   Once this was complete a 6 cm incision was opened with the use of a #10 blade knife.  The Metrx tubes were sequentially advanced under lateral fluoroscopy until a 18 x 40 mm Metrx tube was placed over the facet and lamina and secured to the bed.    The microscope was  then sterilely brought into the field and muscle creep was hemostased with a bipolar and resected with a pituitary rongeur.  A Bovie extender was then used to expose the spinous process and lamina in addition to the old laminoforaminotomy defect.  Careful attention was placed to not violate the facet capsule. A 3 mm matchstick drill bit was then used to make a hemi-laminotomy trough until the ligamentum flavum was exposed.  This was extended to the base of the spinous process.  Once this was complete and the underlying ligamentum flavum was visualized this was dissected with an up angle curette and resected with a #2 and #3 mm biting Kerrison.  The laminotomy opening was also expanded in similar fashion and hemostasis was obtained with Surgifoam and a patty as well as bone wax.  The rostral aspect of the caudal level of the lamina was also resected with a #2 biting Kerrison effort to further enhance exposure.  Once the underlying dura was visualized a Penfield 4 was then used to dissect and expose the traversing nerve root.  Once this was identified a nerve root retractor suction was used to mobilize this medially.  The venous plexus was hemostased with Surgifoam and light bipolar use. The disc herniation was noted just superiorly to the disc space.  During dissection of this, a small durotomy was encountered due to severe scarring.   The disc herniation was identified and dissected free using a balltip probe. The pituitary rongeur was used to remove the extruded disc fragments. Once the thecal sac and nerve root were noted to be relaxed and under less tension the ball-tipped feeler was passed along the foramen distally to to ensure no residual compression was noted.  We then placed duragen and tisseel for closure of the durotomy.  The area was irrigated. The tube system was then removed under microscopic visualization and hemostasis was obtained with a bipolar.    After performing the decompression at L4-5,  the metrx tubes were sequentially advanced and confirmed in position at L3-4. An 23mm by 66mm tube was locked in place to the bed side attachment.  Fluoroscopy was then removed from the field.  The microscope was then sterilely brought into the field and muscle creep was hemostased with a bipolar and resected with a pituitary rongeur.  A Bovie extender was then used to expose the spinous process and lamina.  Careful attention was placed to not violate the facet capsule. A 3 mm matchstick drill bit was then used to make a hemi-laminotomy trough until the ligamentum flavum was exposed.  This was extended to the base of the spinous process and to the contralateral side to remove all the central bone from each side.  Once this was complete and the underlying ligamentum flavum was visualized, it was dissected with a curette and resected with Kerrison rongeurs.  Extensive ligamentum hypertrophy was noted, requiring a substantial amount of time and care for removal.  The dura was identified and palpated. The kerrison rongeur was then used to remove the medial facet bilaterally until no compression was noted.  A balltip probe was used to confirm decompression of the ipsilateral L4 nerve root.  Additional attention was paid to completion of the contralateral foraminotomy until the contralateral L4 nerve root was completely free.  Once this was complete, L3-4 central decompression including medial facetectomy and foraminotomy was confirmed and decompression on both sides was confirmed. No CSF leak was noted.  A Depo-Medrol soaked Gelfoam pledget was placed in the defect.  The wound was copiously irrigated. The tube system was then removed under microscopic visualization and hemostasis was obtained with a bipolar.    After performing the decompression at L3-4, the metrx tubes were sequentially advanced and confirmed in position at L2-3. An 38mm by 53mm tube was locked in place to the bed side attachment.  Fluoroscopy was  then removed from the field.  The microscope was then sterilely brought into the field and muscle creep was hemostased with a bipolar and resected with a pituitary rongeur.  A Bovie extender was then used to expose the spinous process and lamina.  Careful attention was placed to not violate the facet capsule. A 3 mm matchstick drill bit was then used to make a hemi-laminotomy trough until the ligamentum flavum was exposed.  This was extended to the base of the spinous process and to the contralateral side to remove all the central bone from each side.  Once this was complete and the underlying ligamentum flavum was visualized, it was dissected with a curette and resected with Kerrison rongeurs.  Extensive ligamentum hypertrophy was noted, requiring a substantial amount of time and care for removal.  The dura was identified and palpated. The kerrison rongeur was then used to remove the medial facet bilaterally until no compression was noted.  A balltip probe was used to confirm decompression of the ipsilateral L3 nerve root.  Additional attention was paid to completion of the contralateral foraminotomy until the contralateral L3 nerve root was completely free.  Once this was complete, L2-3 central decompression including medial facetectomy and foraminotomy was confirmed and decompression on both sides was confirmed. No CSF leak was noted.  A Depo-Medrol soaked Gelfoam pledget was placed in  the defect.  The wound was copiously irrigated. The tube system was then removed under microscopic visualization and hemostasis was obtained with a bipolar.     The fascial layer was reapproximated with the use of a 0- Vicryl suture.  Subcutaneous tissue layer was reapproximated using 2-0 Vicryl suture.  3-0 nylon was used on the skin. A dressing was placed.  Patient was then rotated back to the preoperative bed awakened from anesthesia and taken to recovery all counts are correct in this case.   I performed the entire  procedure with the assistance of Patsey Berthold NP as an Designer, television/film set.  Venetia Night MD  Please note that the durotomy was encountered during the normal performance of a redo microdiscectomy due to significant scarring in the area.

## 2020-06-28 NOTE — Progress Notes (Signed)
Pharmacy Antibiotic Note  Jenny White is a 74 y.o. female admitted on (Not on file) with surgical prophylaxis.  Pharmacy has been consulted for cefazolin dosing.  Plan: Cefazolin 2 gm IV 60 min pre-op  Height: 5\' 3"  (160 cm) Weight: 65.2 kg (143 lb 12.8 oz) IBW/kg (Calculated) : 52.4  No data recorded.  No results for input(s): WBC, CREATININE, LATICACIDVEN, VANCOTROUGH, VANCOPEAK, VANCORANDOM, GENTTROUGH, GENTPEAK, GENTRANDOM, TOBRATROUGH, TOBRAPEAK, TOBRARND, AMIKACINPEAK, AMIKACINTROU, AMIKACIN in the last 168 hours.  Estimated Creatinine Clearance: 55.2 mL/min (by C-G formula based on SCr of 0.74 mg/dL).    Allergies  Allergen Reactions  . Meperidine Nausea And Vomiting  . Oxycodone-Acetaminophen Nausea And Vomiting    Antimicrobials this admission:   >>    >>   Dose adjustments this admission:   Microbiology results:  BCx:   UCx:    Sputum:    MRSA PCR:   Thank you for allowing pharmacy to be a part of this patient's care.  , D 06/28/2020 1:06 AM
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
## 2020-06-28 NOTE — Anesthesia Procedure Notes (Signed)
Procedure Name: Intubation Date/Time: 06/28/2020 8:39 AM Performed by: Debe Coder, CRNA Pre-anesthesia Checklist: Patient identified, Emergency Drugs available, Suction available and Patient being monitored Patient Re-evaluated:Patient Re-evaluated prior to induction Oxygen Delivery Method: Circle system utilized Preoxygenation: Pre-oxygenation with 100% oxygen Induction Type: IV induction Ventilation: Mask ventilation without difficulty Laryngoscope Size: Mac and 3 Grade View: Grade II Tube type: Oral Tube size: 7.0 mm Number of attempts: 1 Airway Equipment and Method: Stylet and Oral airway Placement Confirmation: ETT inserted through vocal cords under direct vision,  positive ETCO2 and breath sounds checked- equal and bilateral Secured at: 22 cm Tube secured with: Tape Dental Injury: Teeth and Oropharynx as per pre-operative assessment

## 2020-06-28 NOTE — Transfer of Care (Signed)
Immediate Anesthesia Transfer of Care Note  Patient: NAARA KELTY  Procedure(s) Performed: L2-4 DECOMPRESSION, RIGHT L4-5 REDO MICRODISCECTOMY (N/A )  Patient Location: PACU  Anesthesia Type:General  Level of Consciousness: drowsy and patient cooperative  Airway & Oxygen Therapy: Patient Spontanous Breathing and Patient connected to face mask oxygen  Post-op Assessment: Report given to RN and Post -op Vital signs reviewed and stable  Post vital signs: Reviewed and stable  Last Vitals:  Vitals Value Taken Time  BP 149/72 06/28/20 1133  Temp    Pulse    Resp 22 06/28/20 1133  SpO2 99 % 06/28/20 1133    Last Pain:  Vitals:   06/28/20 0741  TempSrc: Oral  PainSc: 7          Complications: No complications documented.

## 2020-06-28 NOTE — Plan of Care (Signed)
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
## 2020-06-29 ENCOUNTER — Encounter: Payer: Self-pay | Admitting: Neurosurgery

## 2020-06-29 DIAGNOSIS — M48062 Spinal stenosis, lumbar region with neurogenic claudication: Secondary | ICD-10-CM | POA: Diagnosis not present

## 2020-06-29 MED ORDER — ENOXAPARIN SODIUM 40 MG/0.4ML ~~LOC~~ SOLN
SUBCUTANEOUS | Status: AC
  Administered 2020-06-29: 40 mg via SUBCUTANEOUS
  Filled 2020-06-29: qty 0.4

## 2020-06-29 MED ORDER — ACETAMINOPHEN 500 MG PO TABS
1000.0000 mg | ORAL_TABLET | Freq: Three times a day (TID) | ORAL | 0 refills | Status: DC
Start: 2020-06-29 — End: 2022-06-06

## 2020-06-29 MED ORDER — DOCUSATE SODIUM 100 MG PO CAPS
100.0000 mg | ORAL_CAPSULE | Freq: Two times a day (BID) | ORAL | 0 refills | Status: DC
Start: 2020-06-29 — End: 2022-06-06

## 2020-06-29 MED ORDER — ACETAMINOPHEN 500 MG PO TABS
ORAL_TABLET | ORAL | Status: AC
  Filled 2020-06-29: qty 2

## 2020-06-29 MED ORDER — OXYCODONE HCL 5 MG PO TABS
ORAL_TABLET | ORAL | Status: AC
  Filled 2020-06-29: qty 2

## 2020-06-29 MED ORDER — INFLUENZA VAC A&B SA ADJ QUAD 0.5 ML IM PRSY
0.5000 mL | PREFILLED_SYRINGE | Freq: Once | INTRAMUSCULAR | Status: DC
  Filled 2020-06-29: qty 0.5

## 2020-06-29 MED ORDER — POLYETHYLENE GLYCOL 3350 17 G PO PACK: 17.0000 g | PACK | Freq: Every day | ORAL | 0 refills | Status: DC | PRN

## 2020-06-29 MED ORDER — GABAPENTIN 100 MG PO CAPS
ORAL_CAPSULE | ORAL | Status: AC
  Filled 2020-06-29: qty 1

## 2020-06-29 MED ORDER — OXYCODONE HCL 5 MG PO TABS
5.0000 mg | ORAL_TABLET | ORAL | 0 refills | Status: DC | PRN
Start: 2020-06-29 — End: 2022-03-21

## 2020-06-29 MED ORDER — SENNA 8.6 MG PO TABS: 2.0000 | ORAL_TABLET | Freq: Two times a day (BID) | ORAL | 0 refills | Status: DC

## 2020-06-29 MED ORDER — TIZANIDINE HCL 2 MG PO TABS: 2.0000 mg | ORAL_TABLET | Freq: Four times a day (QID) | ORAL | 0 refills | Status: DC | PRN

## 2020-06-29 MED ORDER — BISACODYL 5 MG PO TBEC: 5.0000 mg | DELAYED_RELEASE_TABLET | Freq: Every day | ORAL | 0 refills | Status: DC | PRN

## 2020-06-29 MED ORDER — METHOCARBAMOL 750 MG PO TABS
ORAL_TABLET | ORAL | Status: AC
  Filled 2020-06-29: qty 1

## 2020-06-29 NOTE — Progress Notes (Signed)
Dr. Yarbrough at bedside at this time.  

## 2020-06-29 NOTE — Discharge Summary (Signed)
Procedure: L2-4 decompression, R L4-5 microdiscectomy Procedure date: 06/28/2020 Diagnosis: lumbar radiculopathy    History: Jenny White is s/p L2-4 decompression, R L4-5 microdiscectomy POD1: Jenny White is doing well and ready for discharge home.  POD0: Tolerated procedure well. Evaluated in post op recovery still disoriented from anesthesia but able to answer questions and obey commands.   Physical Exam: Vitals:   06/29/20 1033 06/29/20 1100  BP: 133/66   Pulse:  88  Resp:  16  Temp:  97.6 F (36.4 C)  SpO2:  98%    General: Alert and oriented, sitting in bed Strength:5/5 throughout  Sensation: intact and symmetric throughout  Skin: incision clean, dry, intact  Data:  Recent Labs  Lab 06/28/20 0751  NA 140  K 3.6  CL 99  BUN 15  CREATININE 0.80  GLUCOSE 107*   No results for input(s): AST, ALT, ALKPHOS in the last 168 hours.  Invalid input(s): TBILI   Recent Labs  Lab 06/28/20 0751  HGB 16.7*  HCT 49.0*   No results for input(s): APTT, INR in the last 168 hours.       Assessment/Plan:  Jenny White is POD1 s/p L2-4 decompression, R L4-5 microdiscectomy.   She will be discharged home today, HHPT has been set up.  She will follow up in clinic with me in two weeks.  Patsey Berthold, NP Department of Neurosurgery

## 2020-06-29 NOTE — Anesthesia Postprocedure Evaluation (Signed)
Anesthesia Post Note  Patient: Jenny White  Procedure(s) Performed: L2-4 DECOMPRESSION, RIGHT L4-5 REDO MICRODISCECTOMY (N/A )  Patient location during evaluation: PACU Anesthesia Type: General Level of consciousness: awake and alert Pain management: pain level controlled Vital Signs Assessment: post-procedure vital signs reviewed and stable Respiratory status: spontaneous breathing, nonlabored ventilation, respiratory function stable and patient connected to nasal cannula oxygen Cardiovascular status: blood pressure returned to baseline and stable Postop Assessment: no apparent nausea or vomiting Anesthetic complications: no   No complications documented.   Last Vitals:  Vitals:   06/29/20 0139 06/29/20 0539  BP: 123/65 133/61  Pulse: 80 66  Resp: 17 15  Temp:  36.6 C  SpO2: 98% 100%    Last Pain:  Vitals:   06/29/20 0539  TempSrc: Tympanic  PainSc: Asleep                 Yevette Edwards

## 2020-06-29 NOTE — Progress Notes (Signed)
PT ambulating well with assistance, up to restroom prior to d/c

## 2020-06-29 NOTE — Progress Notes (Signed)
Neuro PA at bedside to assess pt at this time

## 2020-06-29 NOTE — Progress Notes (Signed)
PT and family (son) [name] verbalize d/c understanding, follow up , and RX given. PT in NAD, VS. No concerns regarding discharge noted.

## 2020-06-29 NOTE — Progress Notes (Signed)
PT requested flu vax prior to d/c. This RN received verbal order from Dr. Myer Haff. However pt states she is unsure about taking it due to increased fatigue and weakness after surgery and possible side effects of vax. PT states she will call PCP to receive her flu vax after recovery.

## 2020-06-29 NOTE — Progress Notes (Signed)
Per Dr. Myer Haff pt is able to sit up at this time. States PA will come to ambulate pt this morning. This RN elevated HOB and given water. PT in NAD, VS. Will continue to monitor

## 2020-06-29 NOTE — Discharge Instructions (Signed)
PLEASE HOLD ASPIRIN AS ADVISED BY SURGEON Your surgeon has performed an operation on your lumbar spine (low back) to relieve pressure on one or more nerves. Many times, patients feel better immediately after surgery and can "overdo it." Even if you feel well, it is important that you follow these activity guidelines. If you do not let your back heal properly from the surgery, you can increase the chance of a disc herniation and/or return of your symptoms. The following are instructions to help in your recovery once you have been discharged from the hospital.  * Do not take anti-inflammatory medications for 3 days after surgery (naproxen [Aleve], ibuprofen [Advil, Motrin], celecoxib [Celebrex], etc.)  Activity    No bending, lifting, or twisting ("BLT"). Avoid lifting objects heavier than 10 pounds (gallon milk jug).  Where possible, avoid household activities that involve lifting, bending, pushing, or pulling such as laundry, vacuuming, grocery shopping, and childcare. Try to arrange for help from friends and family for these activities while your back heals.  Increase physical activity slowly as tolerated.  Taking short walks is encouraged, but avoid strenuous exercise. Do not jog, run, bicycle, lift weights, or participate in any other exercises unless specifically allowed by your doctor. Avoid prolonged sitting, including car rides.  Talk to your doctor before resuming sexual activity.  You should not drive until cleared by your doctor.  Until released by your doctor, you should not return to work or school.  You should rest at home and let your body heal.   You may shower two days after your surgery.  After showering, lightly dab your incision dry. Do not take a tub bath or go swimming for 3 weeks, or until approved by your doctor at your follow-up appointment.  If you smoke, we strongly recommend that you quit.  Smoking has been proven to interfere with normal healing in your back and will  dramatically reduce the success rate of your surgery. Please contact [facility] (800-QUIT-NOW) and use the resources at www.[facility].com for assistance in stopping smoking.  Surgical Incision   If you have a dressing on your incision, you may remove it three days after your surgery. Keep your incision area clean and dry.  If you have staples or stitches on your incision, you should have a follow up scheduled for removal. If you do not have staples or stitches, you will have steri-strips (small pieces of surgical tape) or Dermabond glue. The steri-strips/glue should begin to peel away within about a week (it is fine if the steri-strips fall off before then). If the strips are still in place one week after your surgery, you may gently remove them.  Diet            You may return to your usual diet. Be sure to stay hydrated.  When to Contact us  Although your surgery and recovery will likely be uneventful, you may have some residual numbness, aches, and pains in your back and/or legs. This is normal and should improve in the next few weeks.  However, should you experience any of the following, contact us immediately: . New numbness or weakness . Pain that is progressively getting worse, and is not relieved by your pain medications or rest . Bleeding, redness, swelling, pain, or drainage from surgical incision . Chills or flu-like symptoms . Fever greater than 101.0 F (38.3 C) . Problems with bowel or bladder functions . Difficulty breathing or shortness of breath . Warmth, tenderness, or swelling in your calf  Contact Information . During office hours (Monday-Friday 9 am to 5 pm), please call your physician at [phone] . After hours and weekends, please call [phone] and an answering service will put you in touch with either Dr. Adriana Simas or Dr. Myer Haff.  . For a life-threatening emergency, call 911

## 2020-08-22 ENCOUNTER — Other Ambulatory Visit: Payer: Self-pay | Admitting: *Deleted

## 2020-08-22 DIAGNOSIS — R918 Other nonspecific abnormal finding of lung field: Secondary | ICD-10-CM

## 2020-08-22 DIAGNOSIS — Z87891 Personal history of nicotine dependence: Secondary | ICD-10-CM

## 2020-08-22 NOTE — Progress Notes (Signed)
Contacted and scheduled for LCS Nodule follow up scan. Patient is a former smoker, 52 pack year history. Confirmed date of smoking cessation as 2011

## 2020-09-06 ENCOUNTER — Other Ambulatory Visit: Payer: Self-pay

## 2020-09-06 ENCOUNTER — Ambulatory Visit
Admission: RE | Admit: 2020-09-06 | Discharge: 2020-09-06 | Disposition: A | Discharge status: Home / Self Care | Payer: Medicare Other | Source: Ambulatory Visit | Attending: Oncology | Admitting: Oncology

## 2020-09-06 DIAGNOSIS — R918 Other nonspecific abnormal finding of lung field: Secondary | ICD-10-CM | POA: Diagnosis present

## 2020-09-06 DIAGNOSIS — Z87891 Personal history of nicotine dependence: Secondary | ICD-10-CM | POA: Diagnosis not present

## 2020-09-06 IMAGING — CT CT CHEST LCS NODULE FOLLOW-UP W/O CM
2 of 5 series · 15 of 40 positions shown, 18 images · non-contrast
Comparison: [DATE] screening chest CT.

CLINICAL DATA: 74-year-old asymptomatic female former smoker with
six-month follow-up.

EXAM:
CT CHEST WITHOUT CONTRAST FOR LUNG CANCER SCREENING NODULE FOLLOW-UP
TECHNIQUE: Multidetector CT imaging of the chest was performed following the
standard protocol without IV contrast.

[Series 3: lung lcs f/u 1.00 · axial · 0.61mm/px · z∈[-1216,-874]mm · 12 of 380 slices shown, 15 images]
[im 19/380  mediastinal]
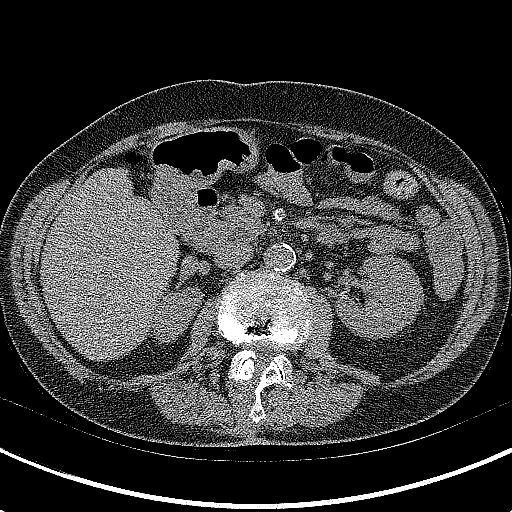
[im 19/380  lung]
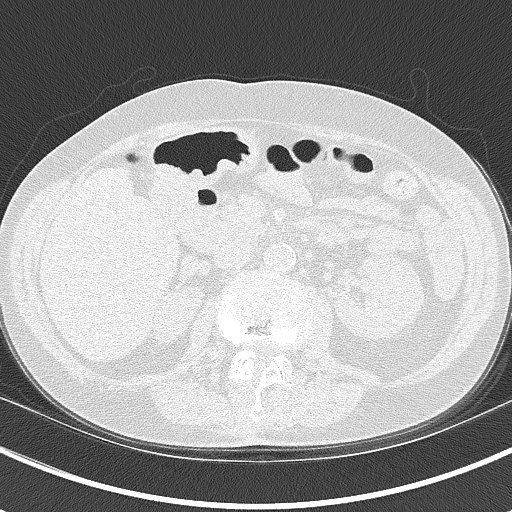
[im 55/380  lung]
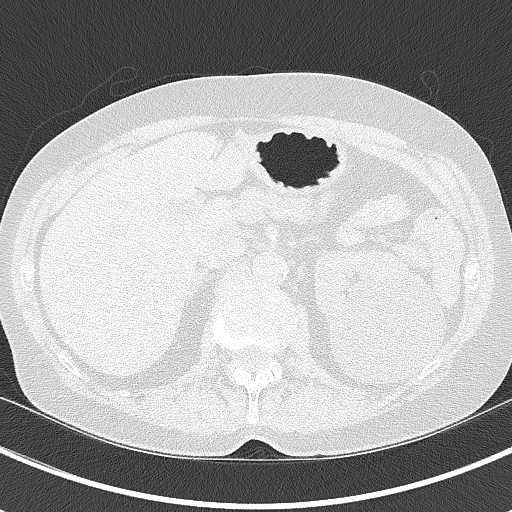
[im 91/380  lung]
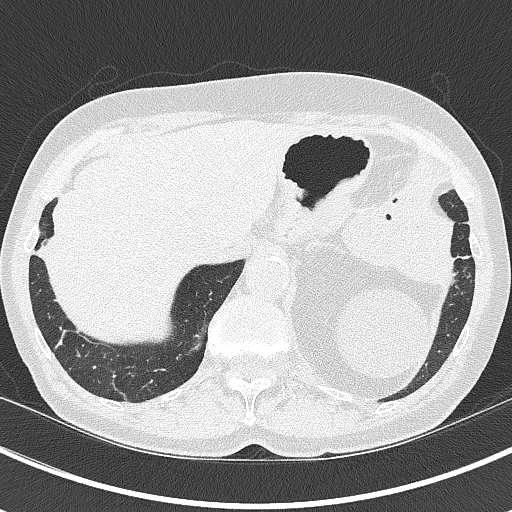
[im 109/380  lung]
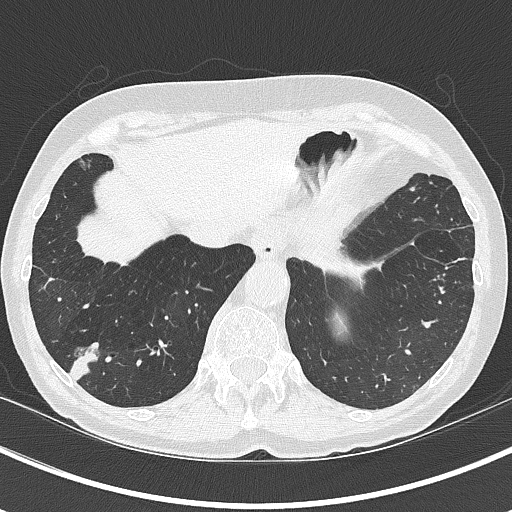
[im 145/380  mediastinal]
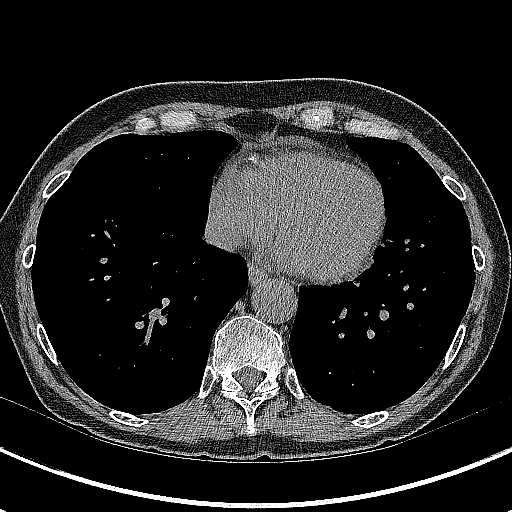
[im 145/380  lung]
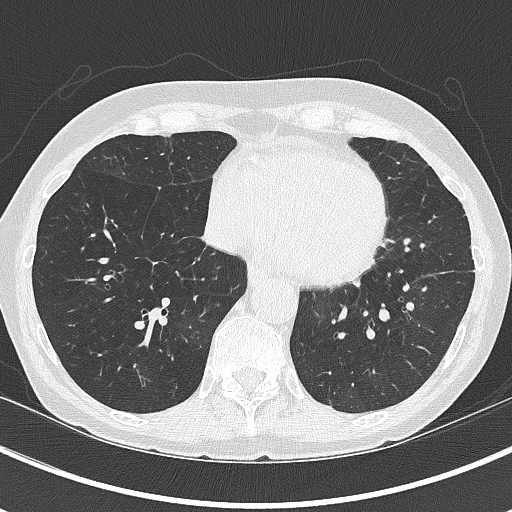
[im 181/380  lung]
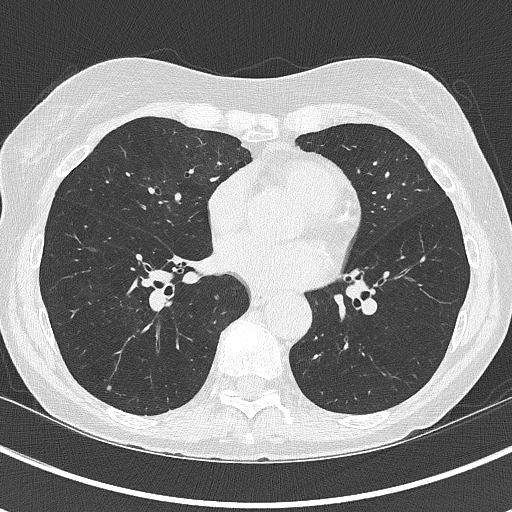
[im 199/380  lung]
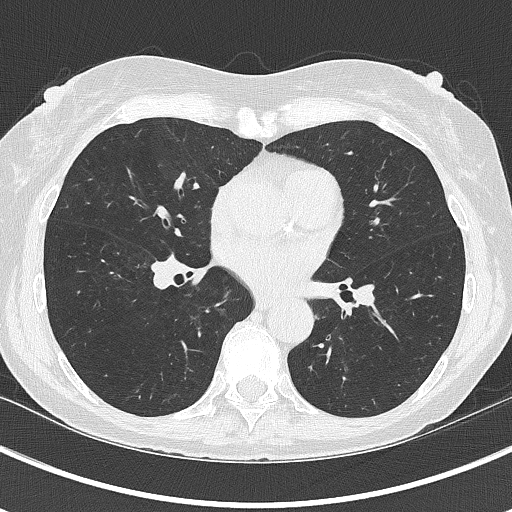
[im 235/380  lung]
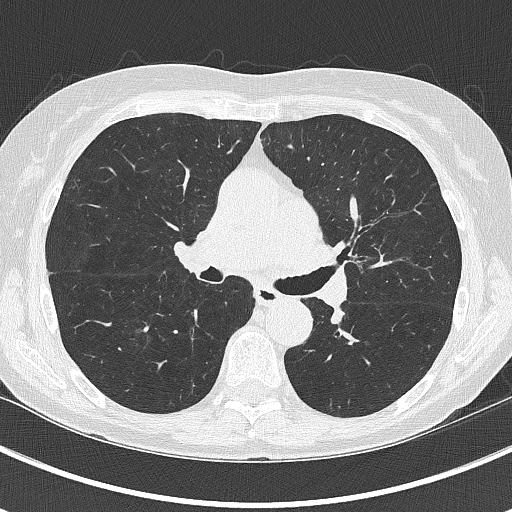
[im 271/380  mediastinal]
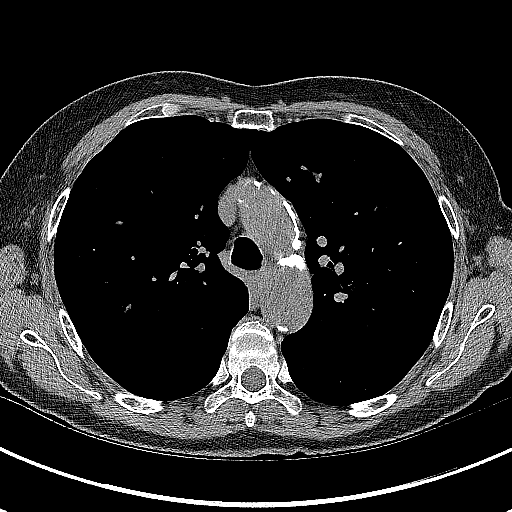
[im 271/380  lung]
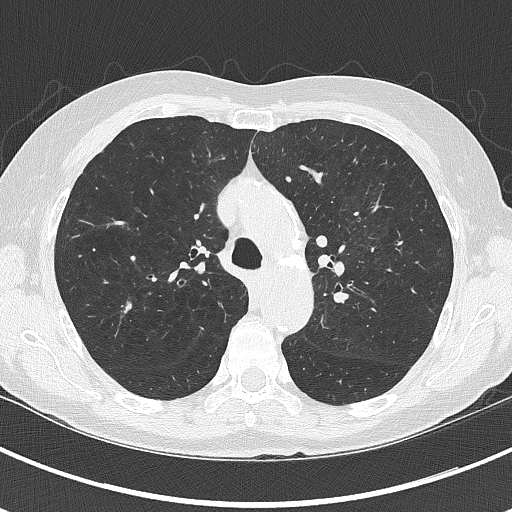
[im 289/380  lung]
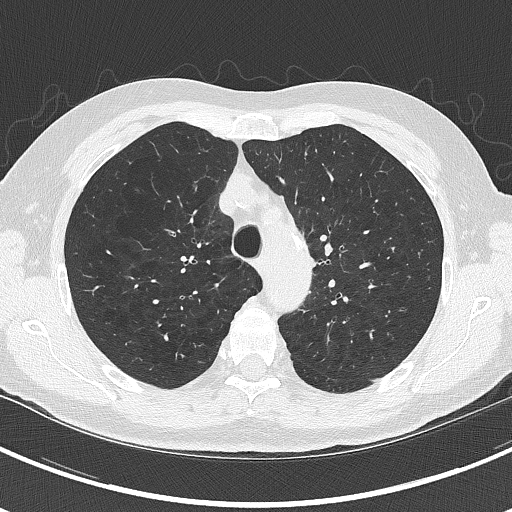
[im 325/380  lung]
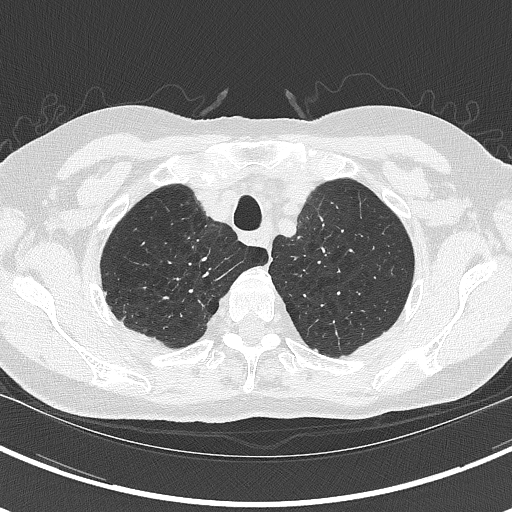
[im 361/380  lung]
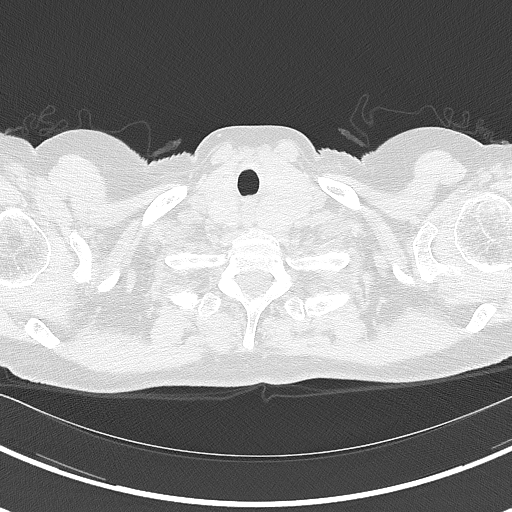

[Series 4: lcs f/u 1.00 cor · coronal · 0.61mm/px · 3 of 248 slices shown]
[im 50/248  lung]
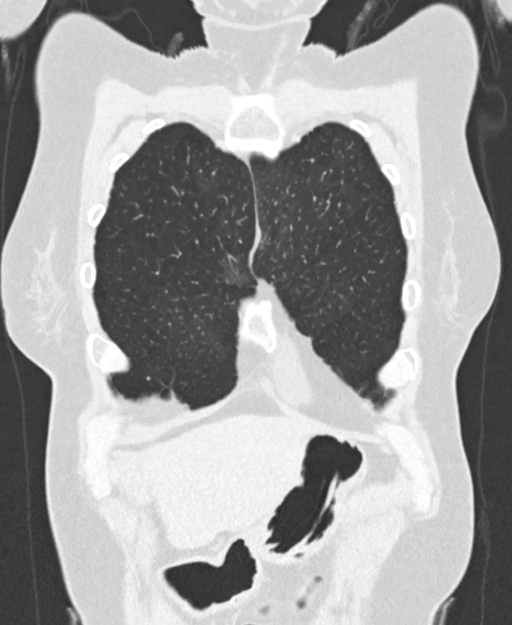
[im 99/248  lung]
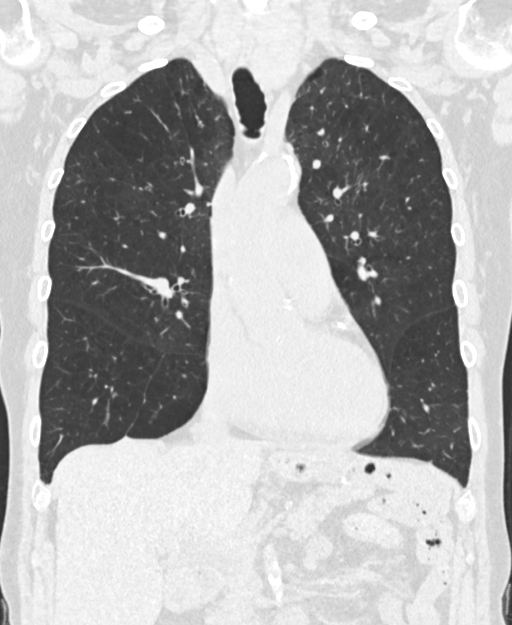
[im 149/248  lung]
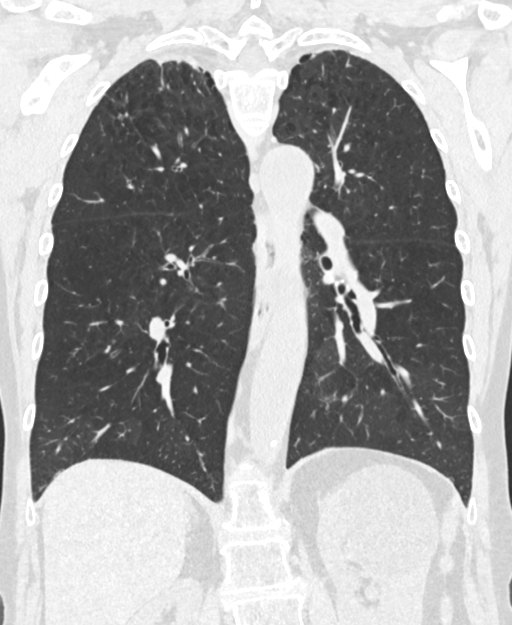

[15 of 40 positions shown; findings below may reference images not displayed]

FINDINGS: Cardiovascular: Normal heart size. Stable trace pericardial
effusion/thickening. Three-vessel coronary atherosclerosis.
Atherosclerotic nonaneurysmal thoracic aorta. Stable top-normal
caliber main pulmonary artery (3.1 cm diameter).

Mediastinum/Nodes: Stable mildly heterogeneous goiter asymmetrically
prominent on the left without discrete thyroid nodules. Unremarkable
esophagus. No pathologically enlarged axillary, mediastinal or hilar
lymph nodes, noting limited sensitivity for the detection of hilar
adenopathy on this noncontrast study.

Lungs/Pleura: No pneumothorax. No pleural effusion. Severe
centrilobular and paraseptal emphysema with mild diffuse bronchial
wall thickening. No acute consolidative airspace disease or lung
masses. No significant growth of previously visualized scattered
pulmonary nodules. New clustered tree-in-bud type nodularity in the
medial left upper lobe, largest 4.5 mm in volume derived mean
diameter (series 3/image 174). New patchy tree-in-bud type
nodularity in the peripheral basilar left lower lobe up to 4.5 mm in
volume derived mean diameter (series 3/image 270).

Upper abdomen: Cholecystectomy. Nonobstructing 3 mm upper left renal
stone. Simple 7.2 cm posterior upper left renal cyst. Status post
splenectomy.

Musculoskeletal: No aggressive appearing focal osseous lesions. Mild
thoracic spondylosis.
IMPRESSION: 1. Lung-RADS 3, probably benign findings. New patchy tree-in-bud
type nodularity in the medial left upper lobe and peripheral basilar
left lower lobe, largest 4.5 mm, more likely inflammatory.
Previously visualized nodules are all stable. Short-term follow-up
in 6 months is recommended with repeat low-dose chest CT without
contrast (please use the following order, "CT CHEST LCS NODULE
FOLLOW-UP W/O CM").
2. Three-vessel coronary atherosclerosis.
3. Nonobstructing left nephrolithiasis.
4. Aortic Atherosclerosis ([EC]-[EC]) and Emphysema ([EC]-[EC]).

## 2020-09-12 ENCOUNTER — Telehealth: Payer: Self-pay | Admitting: *Deleted

## 2020-09-12 NOTE — Telephone Encounter (Signed)
Notified patient of LDCT lung cancer screening program results with recommendation for 6 month follow up imaging. Also notified of incidental findings noted below and is encouraged to discuss further with PCP who will receive a copy of this note and/or the CT report. Patient verbalizes understanding.   IMPRESSION: 1. Lung-RADS 3, probably benign findings. New patchy tree-in-bud type nodularity in the medial left upper lobe and peripheral basilar left lower lobe, largest 4.5 mm, more likely inflammatory. Previously visualized nodules are all stable. Short-term follow-up in 6 months is recommended with repeat low-dose chest CT without contrast (please use the following order, "CT CHEST LCS NODULE FOLLOW-UP W/O CM"). 2. Three-vessel coronary atherosclerosis. 3. Nonobstructing left nephrolithiasis. 4. Aortic Atherosclerosis (ICD10-I70.0) and Emphysema (ICD10-J43.9).

## 2020-11-12 LAB — COLOGUARD: COLOGUARD: NEGATIVE

## 2020-11-12 LAB — EXTERNAL GENERIC LAB PROCEDURE: COLOGUARD: NEGATIVE

## 2020-12-07 ENCOUNTER — Other Ambulatory Visit: Payer: Self-pay | Admitting: Internal Medicine

## 2020-12-07 DIAGNOSIS — Z1231 Encounter for screening mammogram for malignant neoplasm of breast: Secondary | ICD-10-CM

## 2020-12-19 ENCOUNTER — Other Ambulatory Visit: Payer: Self-pay

## 2020-12-19 ENCOUNTER — Ambulatory Visit
Admission: RE | Admit: 2020-12-19 | Discharge: 2020-12-19 | Disposition: A | Discharge status: Home / Self Care | Payer: Medicare Other | Source: Ambulatory Visit | Attending: Internal Medicine | Admitting: Internal Medicine

## 2020-12-19 DIAGNOSIS — Z1231 Encounter for screening mammogram for malignant neoplasm of breast: Secondary | ICD-10-CM | POA: Diagnosis present

## 2020-12-19 IMAGING — MG MM DIGITAL SCREENING BILAT W/ TOMO AND CAD
8 series · 8 of 24 positions shown · non-contrast
Comparison: Previous exam(s).

CLINICAL DATA: Screening.

EXAM:
DIGITAL SCREENING BILATERAL MAMMOGRAM WITH TOMOSYNTHESIS AND CAD
TECHNIQUE: Bilateral screening digital craniocaudal and mediolateral oblique
mammograms were obtained. Bilateral screening digital breast
tomosynthesis was performed. The images were evaluated with
computer-aided detection.

[R CC synth-2D]
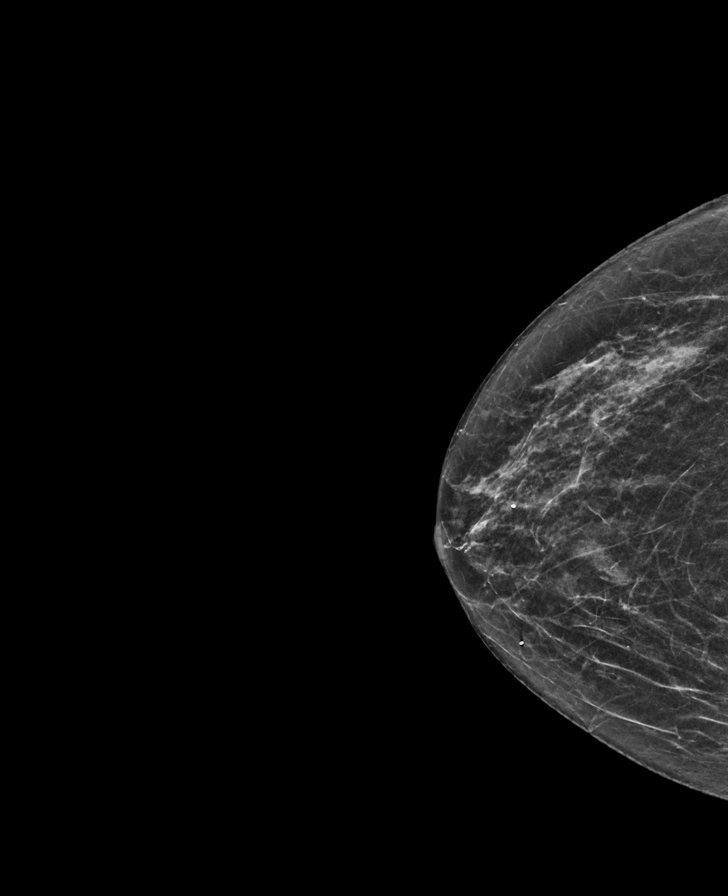

[R MLO synth-2D]
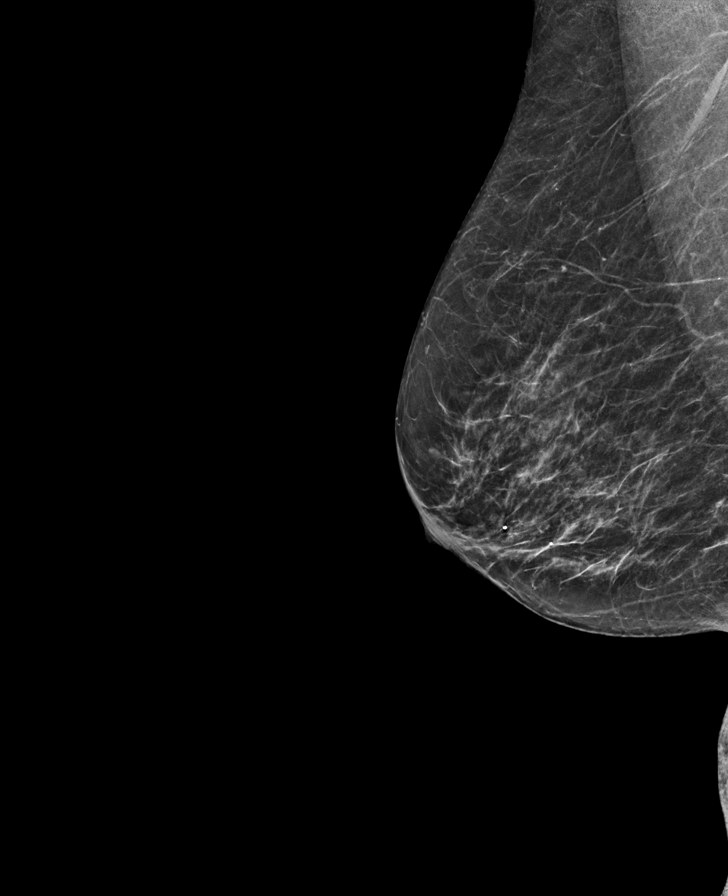

[L MLO synth-2D]
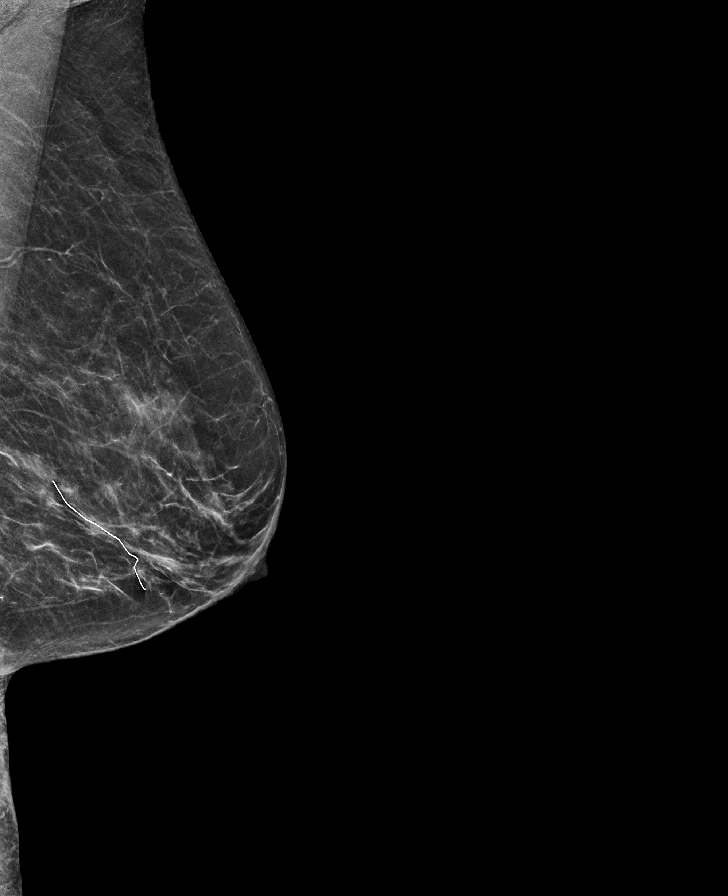

[L CC synth-2D]
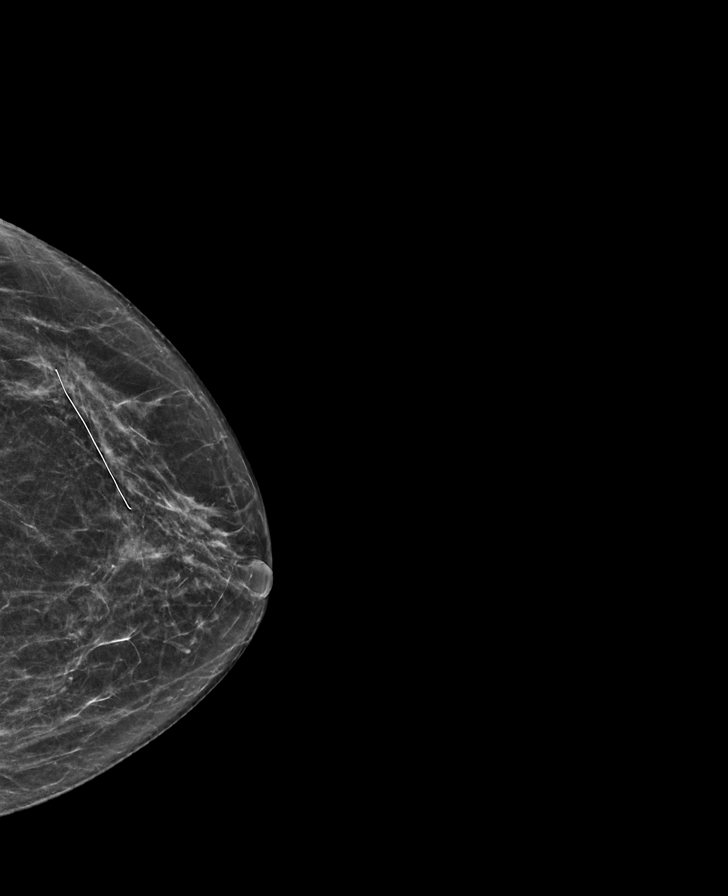

[R MLO tomo · tomo slice 28/55.0]
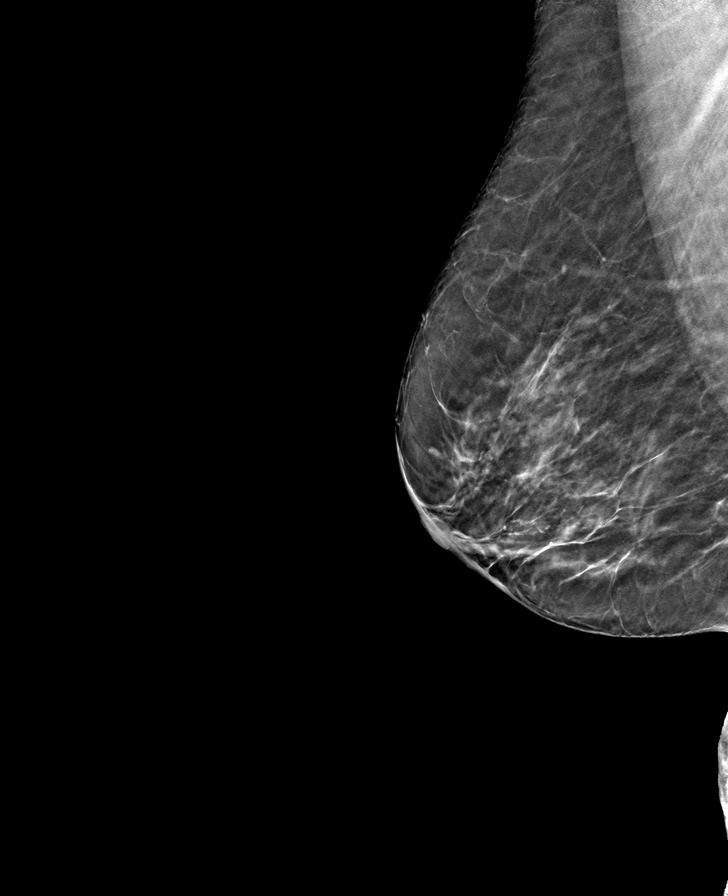

[R CC tomo · tomo slice 27/52.0]
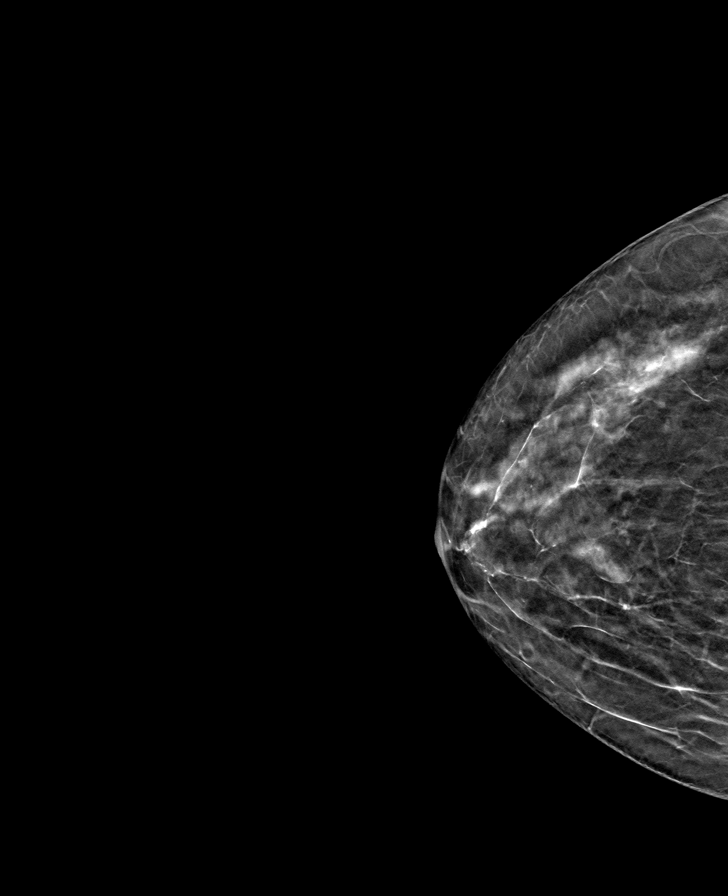

[L MLO tomo · tomo slice 27/54.0]
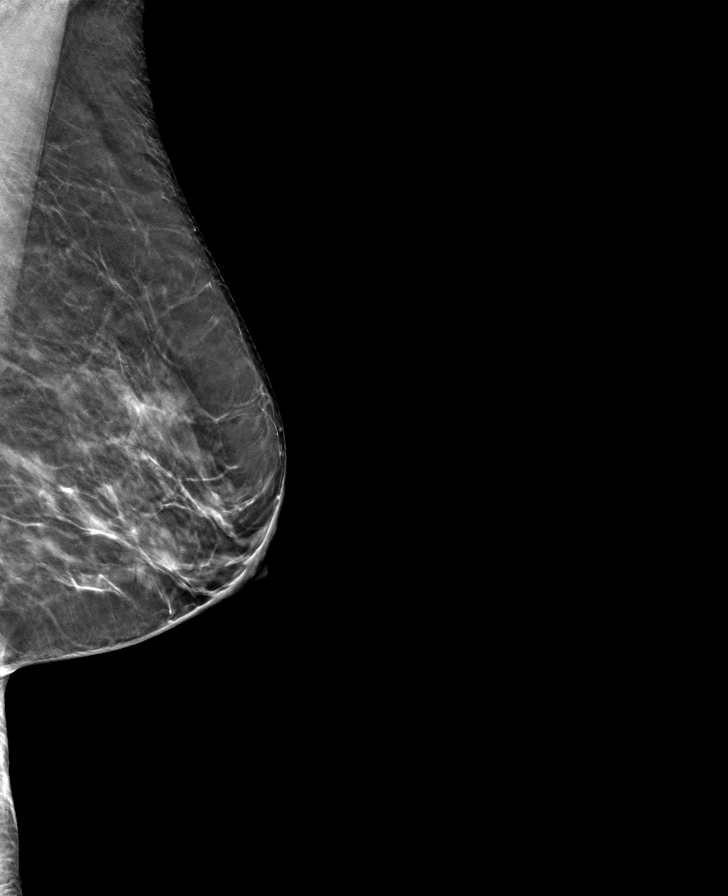

[L CC tomo · tomo slice 27/53.0]
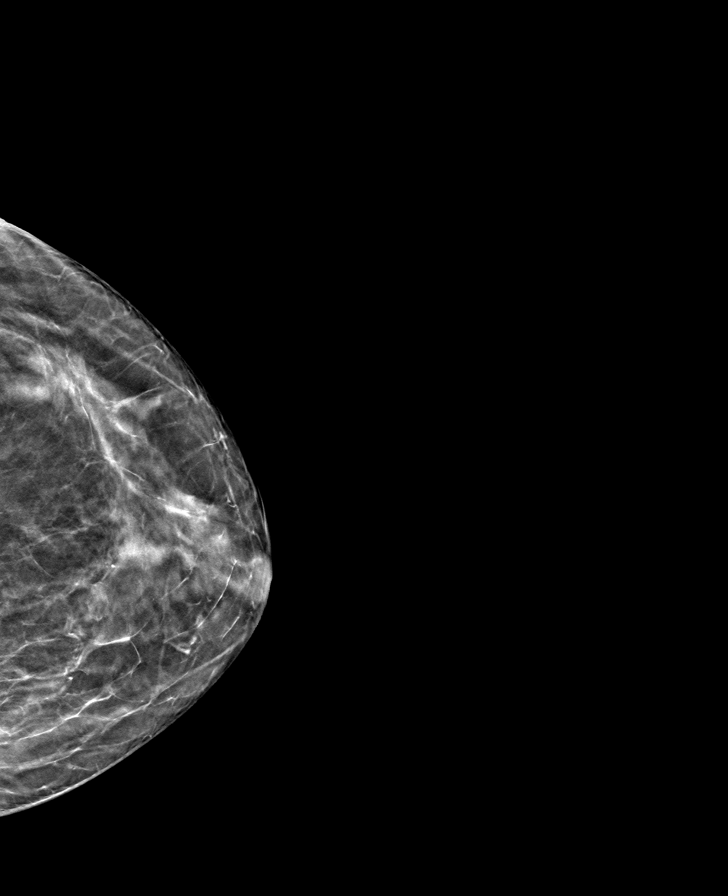

[8 of 24 positions shown; findings below may reference images not displayed]

ACR Breast Density Category b: There are scattered areas of
fibroglandular density.
FINDINGS: There are no findings suspicious for malignancy. The images were
evaluated with computer-aided detection.
IMPRESSION: No mammographic evidence of malignancy. A result letter of this
screening mammogram will be mailed directly to the patient.

RECOMMENDATION:
Screening mammogram in one year. (Code:[OD])

BI-RADS CATEGORY  1: Negative.

## 2021-03-19 ENCOUNTER — Encounter: Payer: Self-pay | Admitting: *Deleted

## 2021-03-19 ENCOUNTER — Telehealth: Payer: Self-pay | Admitting: *Deleted

## 2021-03-19 DIAGNOSIS — Z87891 Personal history of nicotine dependence: Secondary | ICD-10-CM

## 2021-03-19 DIAGNOSIS — Z122 Encounter for screening for malignant neoplasm of respiratory organs: Secondary | ICD-10-CM

## 2021-03-19 NOTE — Telephone Encounter (Signed)
Spoke to patient via telephone re: scheduling her 6 month follow up low dose lung screening ct scan. Former smoker, quit in 2011, 52 pack years. Scan scheduled for 03/28/21 @ 11:00 am.

## 2021-03-28 ENCOUNTER — Other Ambulatory Visit: Payer: Self-pay

## 2021-03-28 ENCOUNTER — Ambulatory Visit
Admission: RE | Admit: 2021-03-28 | Discharge: 2021-03-28 | Disposition: A | Discharge status: Home / Self Care | Payer: Medicare Other | Source: Ambulatory Visit | Attending: Acute Care | Admitting: Acute Care

## 2021-03-28 DIAGNOSIS — Z122 Encounter for screening for malignant neoplasm of respiratory organs: Secondary | ICD-10-CM

## 2021-03-28 DIAGNOSIS — Z87891 Personal history of nicotine dependence: Secondary | ICD-10-CM | POA: Diagnosis present

## 2021-03-28 IMAGING — CT CT CHEST LCS NODULE FOLLOW-UP W/O CM
2 of 5 series · 15 of 40 positions shown, 18 images · non-contrast
Comparison: [DATE] .

CLINICAL DATA: Lung cancer screening. Former asymptomatic smoker
with 52 pack-year history.

EXAM:
CT CHEST WITHOUT CONTRAST FOR LUNG CANCER SCREENING NODULE FOLLOW-UP
TECHNIQUE: Multidetector CT imaging of the chest was performed following the
standard protocol without IV contrast.

[Series 3: lung lcs f/u 1.00 · axial · 0.63mm/px · z∈[-1240,-911]mm · 12 of 365 slices shown, 15 images]
[im 18/365  mediastinal]
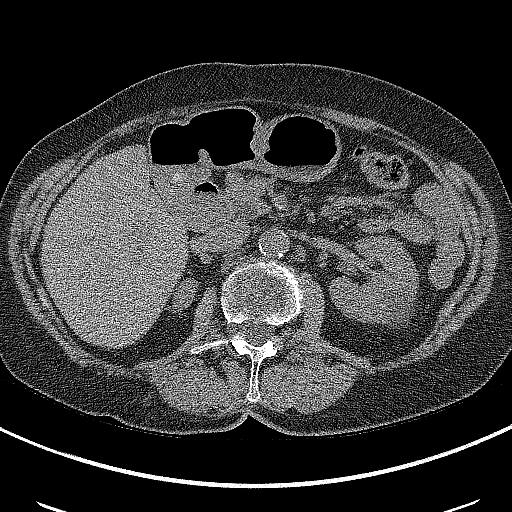
[im 18/365  lung]
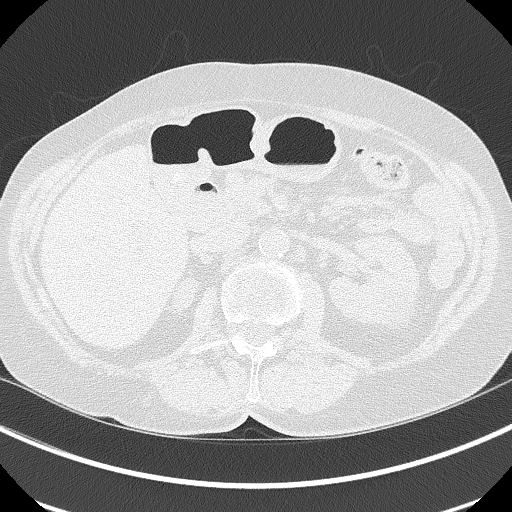
[im 53/365  lung]
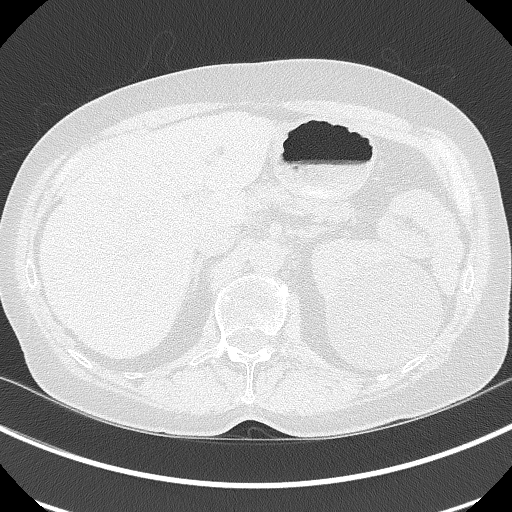
[im 87/365  lung]
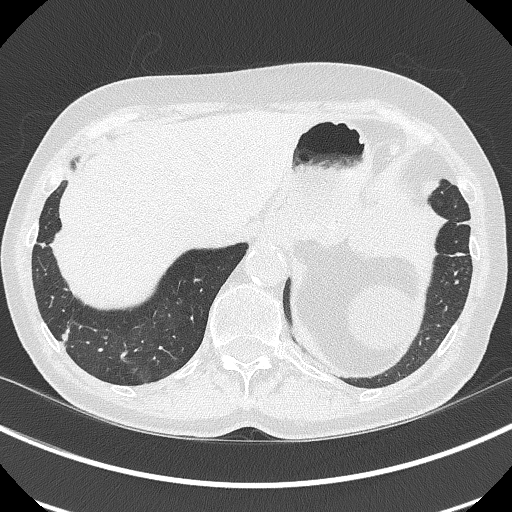
[im 105/365  lung]
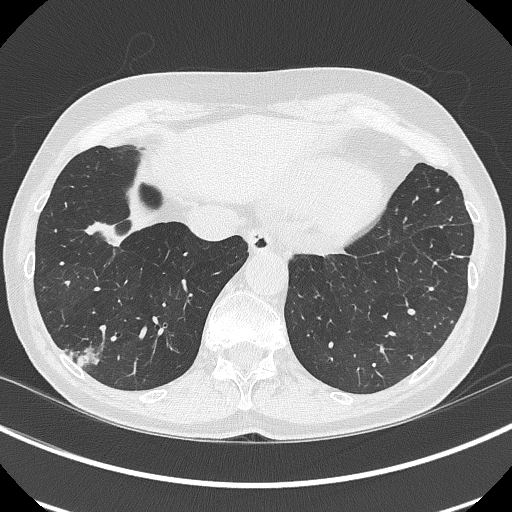
[im 139/365  mediastinal]
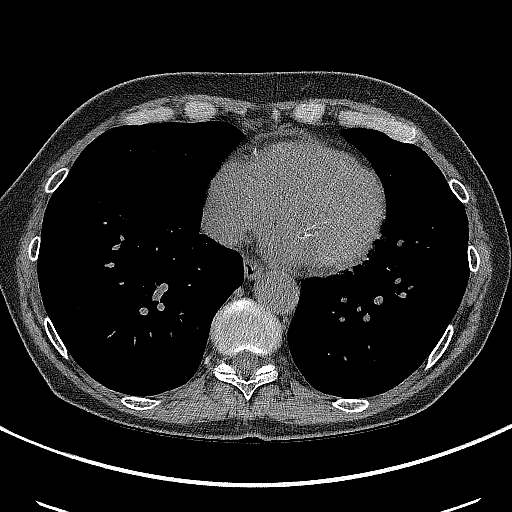
[im 139/365  lung]
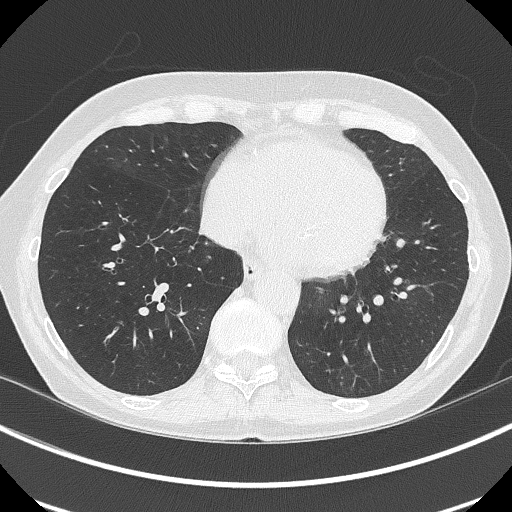
[im 174/365  lung]
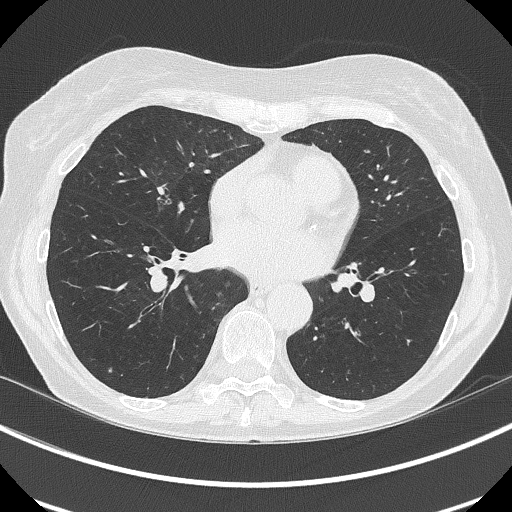
[im 191/365  lung]
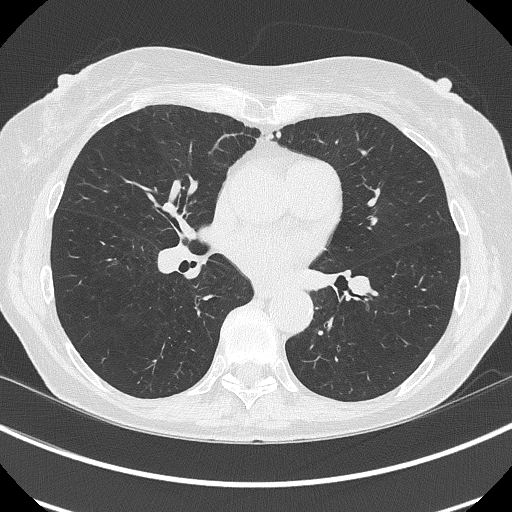
[im 226/365  lung]
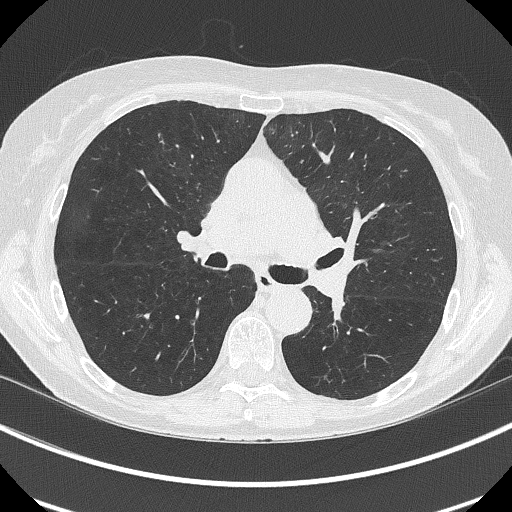
[im 261/365  mediastinal]
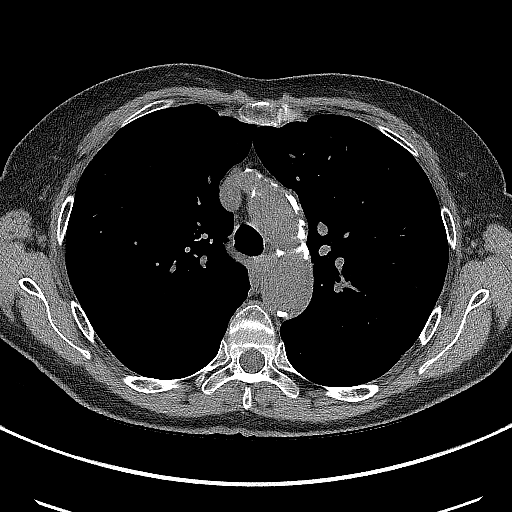
[im 261/365  lung]
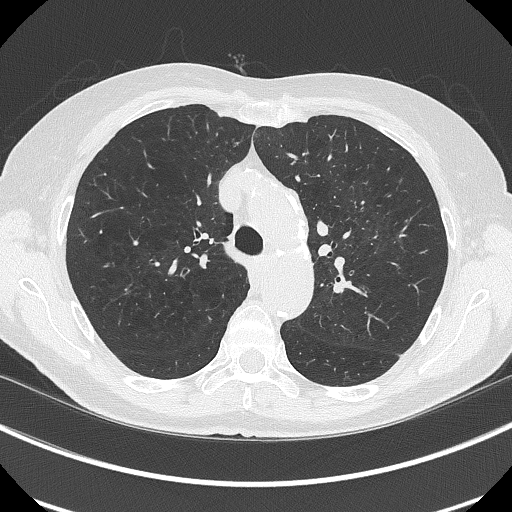
[im 278/365  lung]
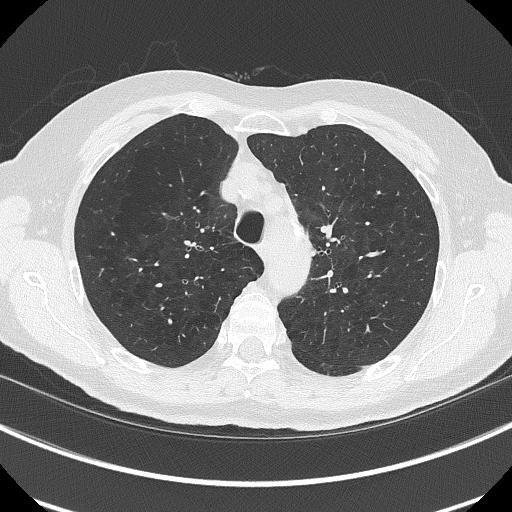
[im 313/365  lung]
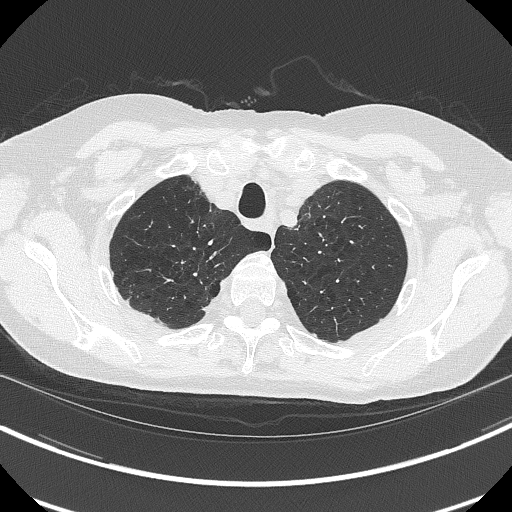
[im 347/365  lung]
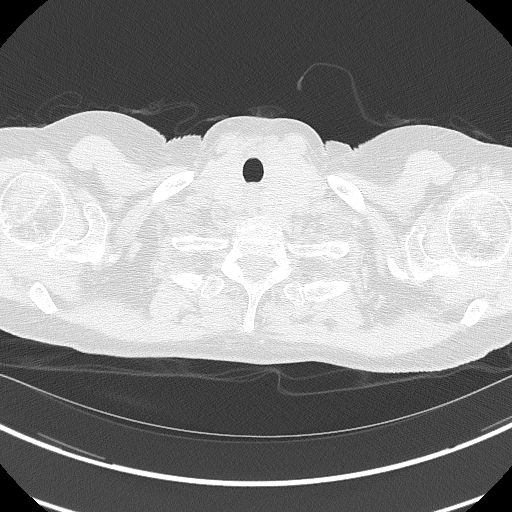

[Series 5: lcs f/u 1.00 cor · coronal · 0.63mm/px · 3 of 251 slices shown]
[im 51/251  lung]
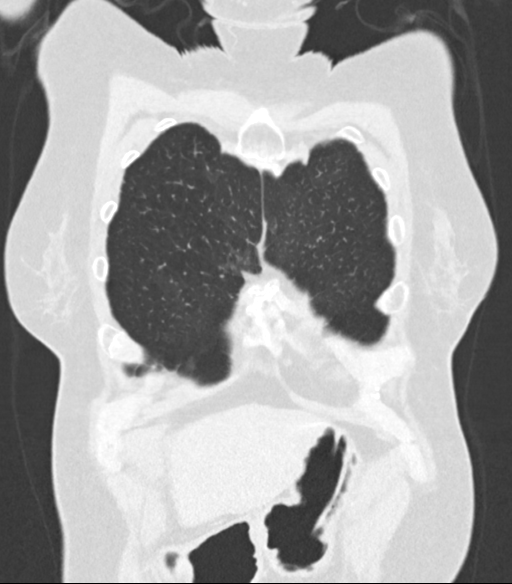
[im 101/251  lung]
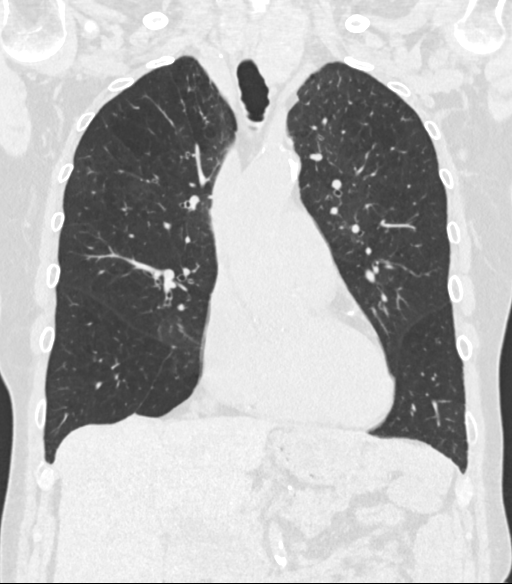
[im 151/251  lung]
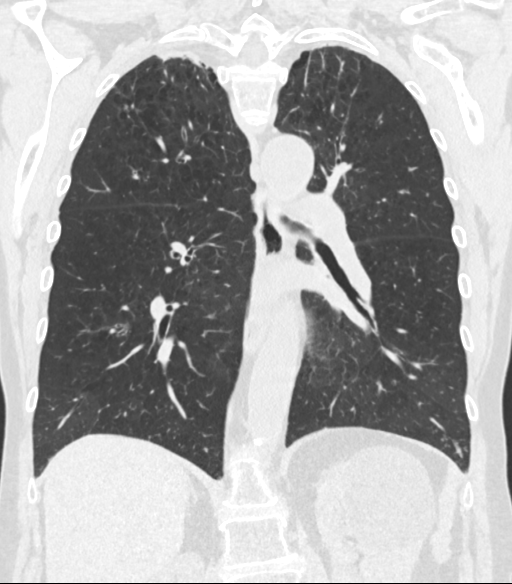

[15 of 40 positions shown; findings below may reference images not displayed]

FINDINGS: Cardiovascular: Heart size normal. No pericardial effusion. Aortic
atherosclerosis and coronary artery calcifications.

Mediastinum/Nodes: Heterogeneous low-attenuation thyroid goiter
identified without discrete nodule. The trachea appears patent and
is midline. Normal appearance of the esophagus

No enlarged axillary, supraclavicular, mediastinal or hilar lymph
nodes.

Lungs/Pleura: Moderate paraseptal and centrilobular emphysema. No
pleural effusion, airspace consolidation, or pneumothorax. Multiple
scattered lung nodules are identified bilaterally. These are all
stable compared with the previous exam. The largest is in the
posterior right lower lobe with a mean derived diameter of 1.4 cm.
No suspicious nodules identified at this time.

Upper Abdomen: Large left kidney cyst measures 7.1 cm. Multiple
small left renal calculi. Status post cholecystectomy. Aortic
atherosclerosis.

Musculoskeletal: No chest wall mass or suspicious bone lesions
identified.
IMPRESSION: 1. Lung-RADS 2, benign appearance or behavior. Continue annual
screening with low-dose chest CT without contrast in 12 months.
2. Multi vessel coronary artery calcifications.

Aortic Atherosclerosis ([EY]-[EY]) and Emphysema ([EY]-[EY]).

## 2021-03-30 ENCOUNTER — Encounter: Payer: Self-pay | Admitting: *Deleted

## 2021-03-30 NOTE — Progress Notes (Signed)
Please call patient and let them  know their  low dose Ct was read as a Lung RADS 2: nodules that are benign in appearance and behavior with a very low likelihood of becoming a clinically active cancer due to size or lack of growth. Recommendation per radiology is for a repeat LDCT in 12 months. .Please let them  know we will order and schedule their  annual screening scan for 03/2022. Please let them  know there was notation of CAD on their  scan.  Please remind the patient  that this is a non-gated exam therefore degree or severity of disease  cannot be determined. Please have them  follow up with their PCP regarding potential risk factor modification, dietary therapy or pharmacologic therapy if clinically indicated. Pt.  is not  currently on statin therapy. Please place order for annual  screening scan for  03/2022 and fax results to PCP. Thanks so much.  There was notation of multivessel coronary artery calcifications. She is not on a statin. Please have her follow up with PCP. Thanks so much

## 2021-04-02 ENCOUNTER — Other Ambulatory Visit: Payer: Self-pay | Admitting: *Deleted

## 2021-04-02 DIAGNOSIS — Z87891 Personal history of nicotine dependence: Secondary | ICD-10-CM

## 2021-11-30 ENCOUNTER — Other Ambulatory Visit: Payer: Self-pay | Admitting: Internal Medicine

## 2021-11-30 DIAGNOSIS — Z1231 Encounter for screening mammogram for malignant neoplasm of breast: Secondary | ICD-10-CM

## 2022-01-07 ENCOUNTER — Ambulatory Visit
Admission: RE | Admit: 2022-01-07 | Discharge: 2022-01-07 | Disposition: A | Discharge status: Home / Self Care | Payer: Medicare Other | Source: Ambulatory Visit | Attending: Internal Medicine | Admitting: Internal Medicine

## 2022-01-07 DIAGNOSIS — Z1231 Encounter for screening mammogram for malignant neoplasm of breast: Secondary | ICD-10-CM | POA: Diagnosis not present

## 2022-01-07 IMAGING — MG MM DIGITAL SCREENING BILAT W/ TOMO AND CAD
8 series · 9 of 24 positions shown · non-contrast
Comparison: Previous exam(s).

CLINICAL DATA: Screening.

EXAM:
DIGITAL SCREENING BILATERAL MAMMOGRAM WITH TOMOSYNTHESIS AND CAD
TECHNIQUE: Bilateral screening digital craniocaudal and mediolateral oblique
mammograms were obtained. Bilateral screening digital breast
tomosynthesis was performed. The images were evaluated with
computer-aided detection.

[R CC synth-2D]
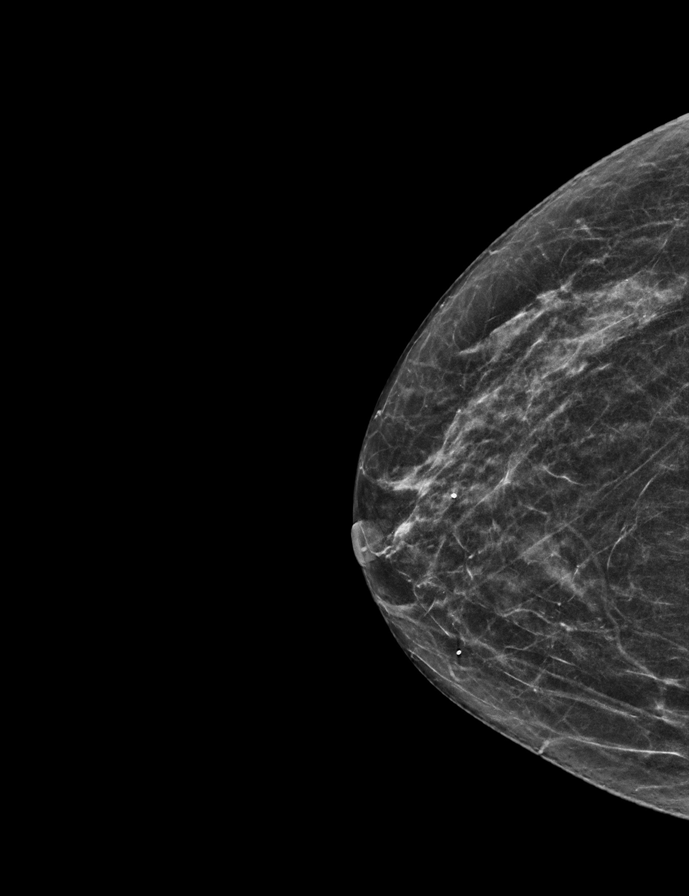

[R MLO synth-2D]
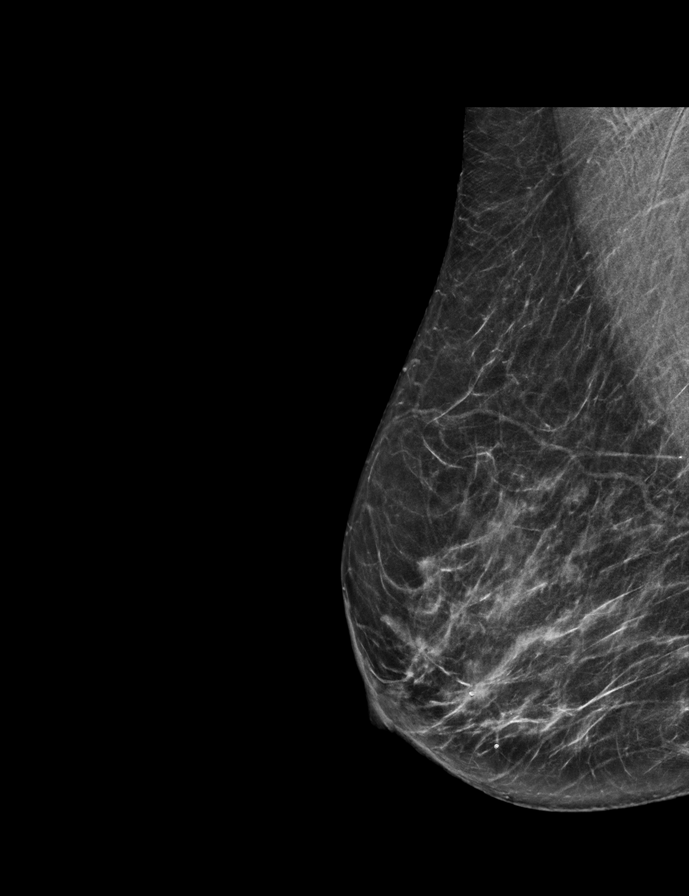

[L CC synth-2D]
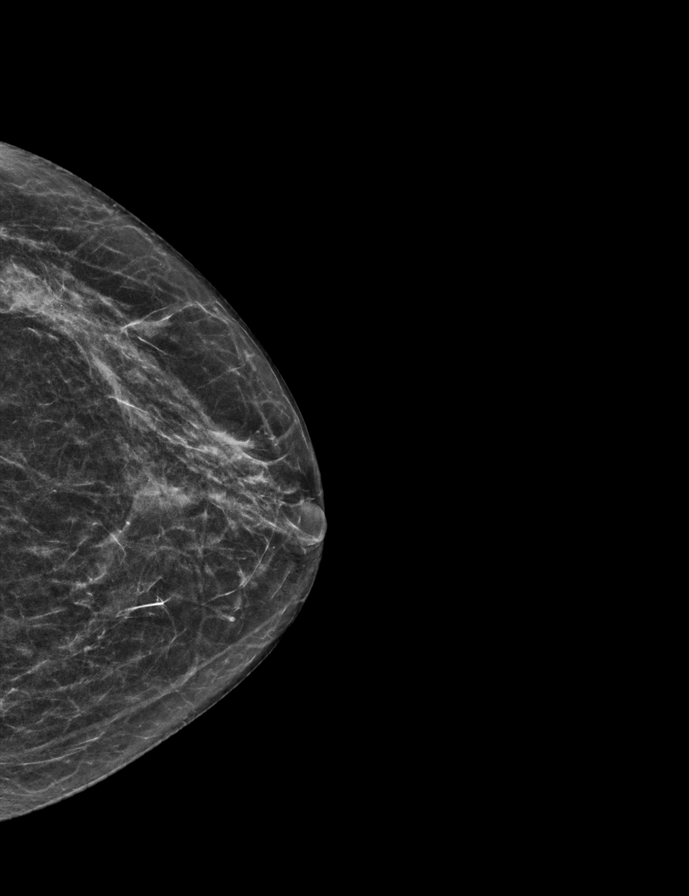

[L MLO synth-2D]
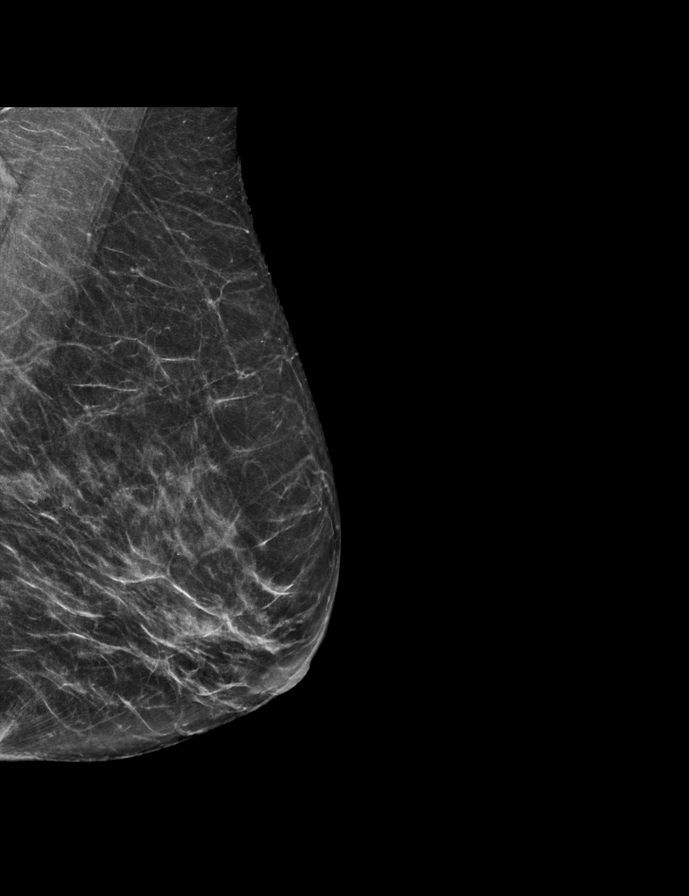

[R CC tomo · 2 of 49 frames shown]
[frame 16/49]
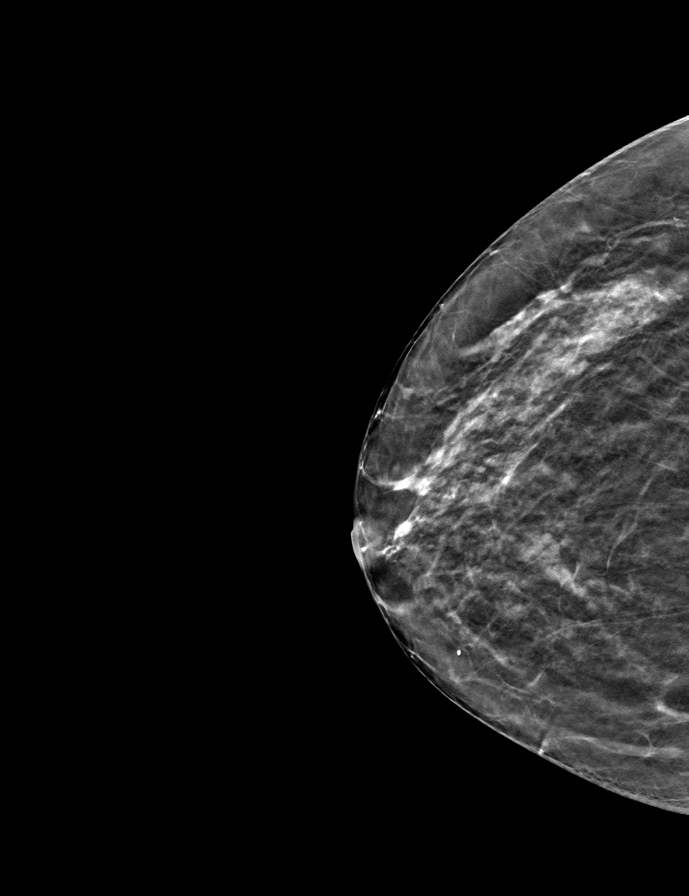
[frame 25/49]
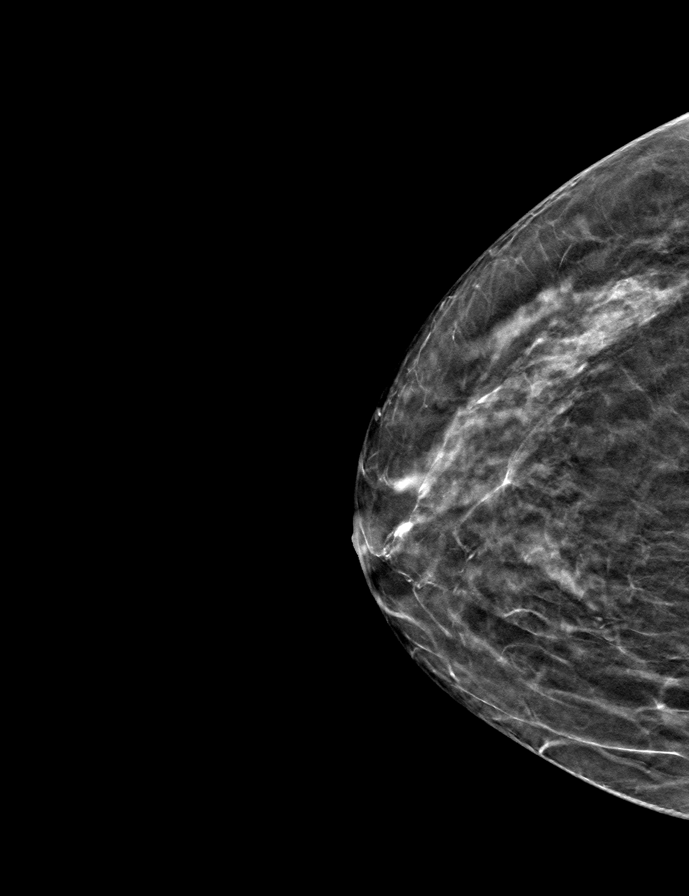

[L CC tomo · tomo slice 23/46.0]
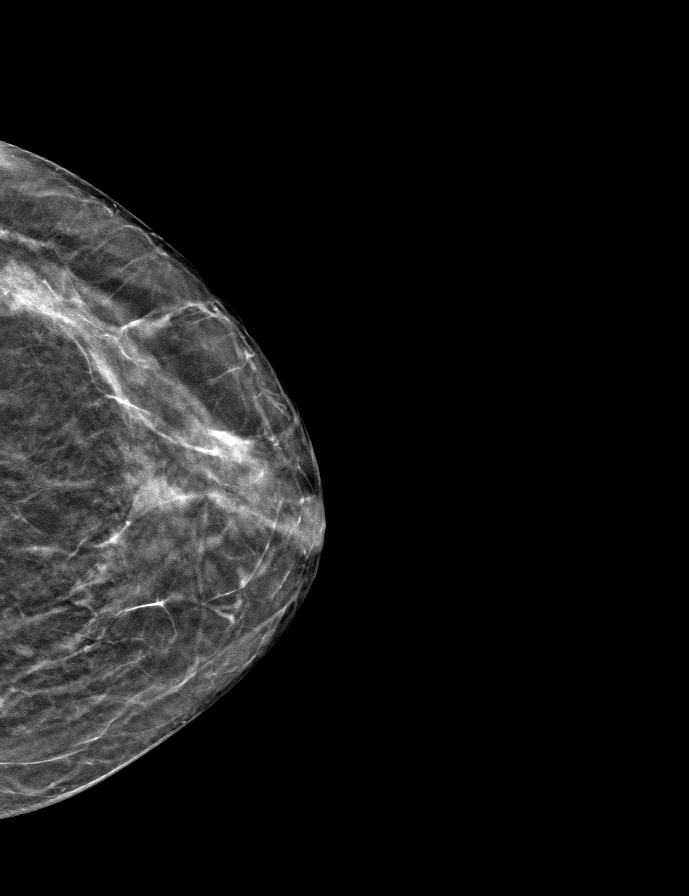

[L MLO tomo · tomo slice 27/52.0]
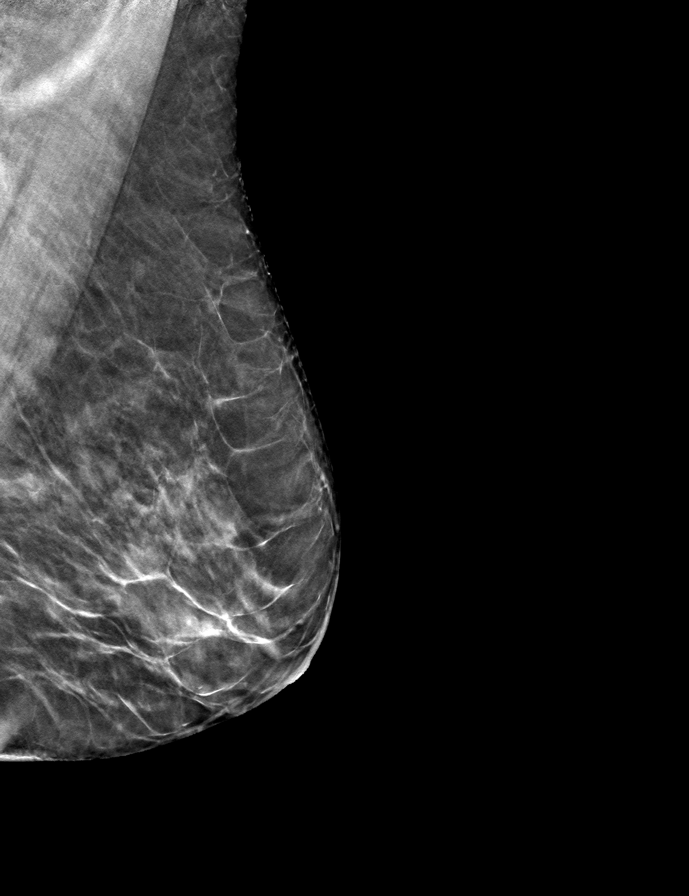

[R MLO tomo · tomo slice 28/55.0]
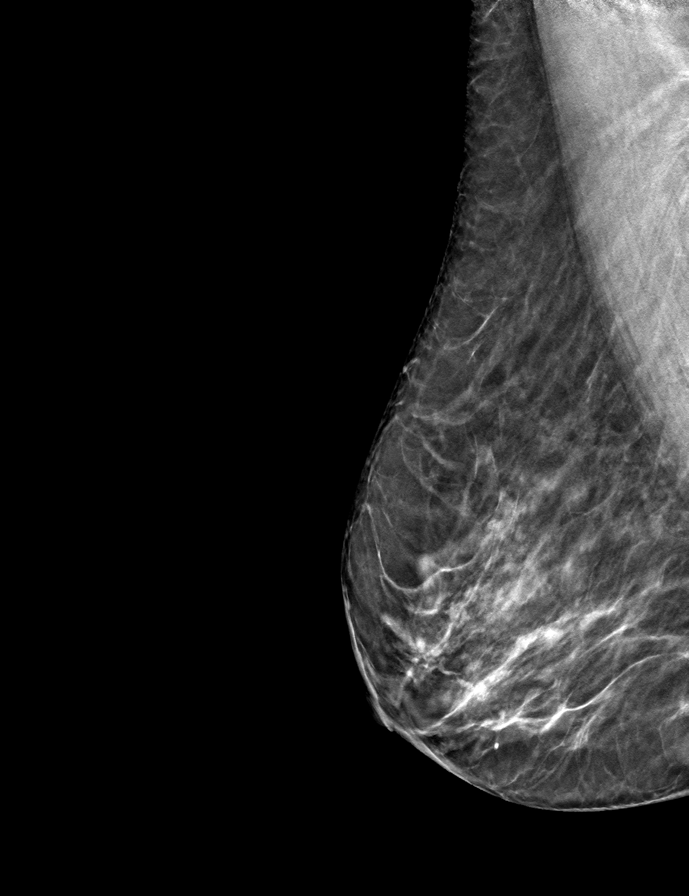

[9 of 24 positions shown; findings below may reference images not displayed]

ACR Breast Density Category b: There are scattered areas of
fibroglandular density.
FINDINGS: There are no findings suspicious for malignancy.
IMPRESSION: No mammographic evidence of malignancy. A result letter of this
screening mammogram will be mailed directly to the patient.

RECOMMENDATION:
Screening mammogram in one year. (Code:[BY])

BI-RADS CATEGORY  1: Negative.

## 2022-01-29 ENCOUNTER — Other Ambulatory Visit: Payer: Self-pay | Admitting: Neurosurgery

## 2022-01-29 DIAGNOSIS — M48062 Spinal stenosis, lumbar region with neurogenic claudication: Secondary | ICD-10-CM

## 2022-02-12 ENCOUNTER — Ambulatory Visit
Admission: RE | Admit: 2022-02-12 | Discharge: 2022-02-12 | Disposition: A | Discharge status: Home / Self Care | Payer: Medicare Other | Source: Ambulatory Visit | Attending: Neurosurgery | Admitting: Neurosurgery

## 2022-02-12 DIAGNOSIS — M48062 Spinal stenosis, lumbar region with neurogenic claudication: Secondary | ICD-10-CM

## 2022-02-12 IMAGING — MR MR LUMBAR SPINE W/O CM
5 series · 31 of 48 positions shown · non-contrast
Comparison: MRI lumbar spine [DATE].

CLINICAL DATA: Low back pain radiating into the right buttock and
foot for 3 months. History of prior lumbar surgery.

EXAM:
MRI LUMBAR SPINE WITHOUT CONTRAST
TECHNIQUE: Multiplanar, multisequence MR imaging of the lumbar spine was
performed. No intravenous contrast was administered.

[Series 5: T2 · sagittal · 4.0mm · 0.81mm/px · 6 of 17 slices shown (1 of 2)]
[im 1/17]
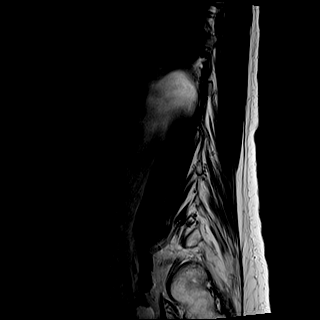
[im 4/17]
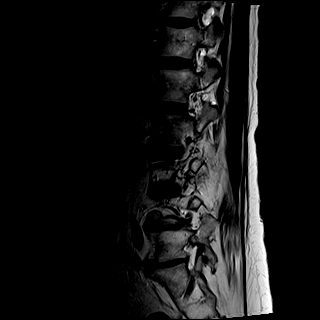
[im 7/17]
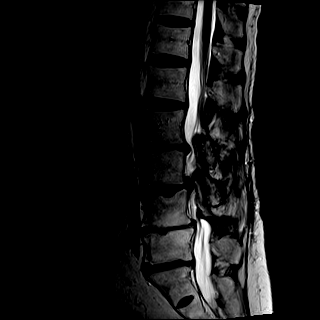
[im 10/17]
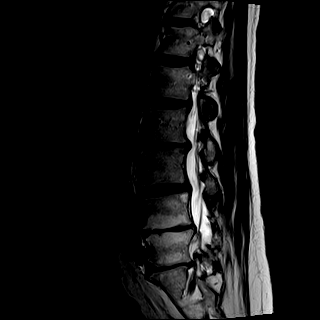
[im 13/17]
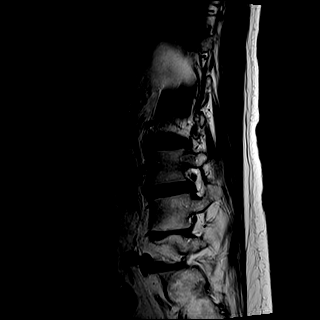
[im 17/17]
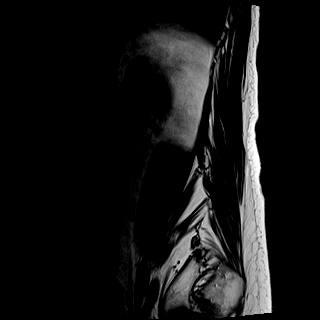

[Series 6: T1 · sagittal · 4.0mm · 0.81mm/px · 7 of 17 slices shown (1 of 2)]
[im 1/17]
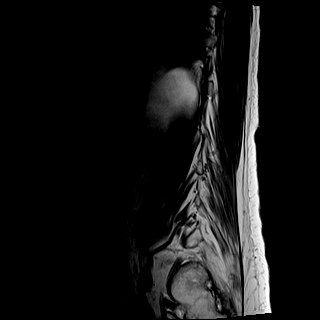
[im 3/17]
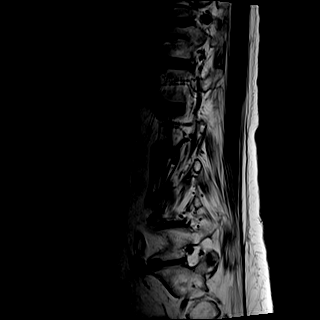
[im 6/17]
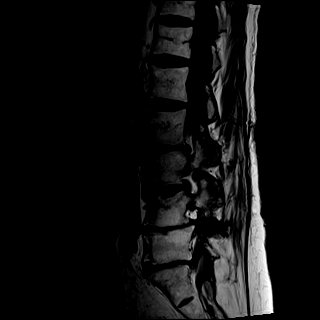
[im 9/17]
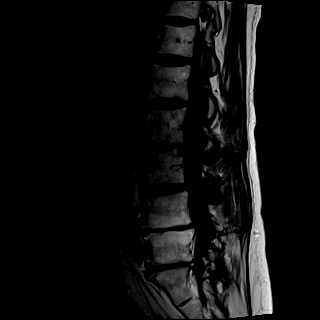
[im 11/17]
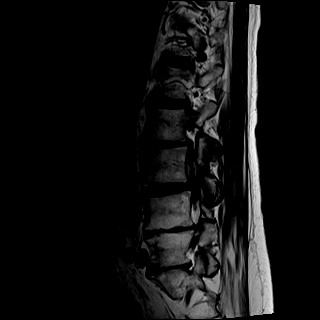
[im 14/17]
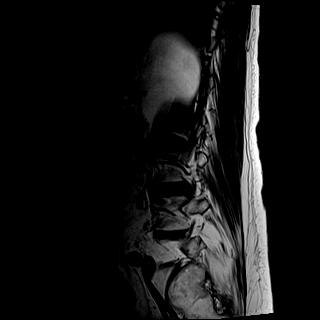
[im 17/17]
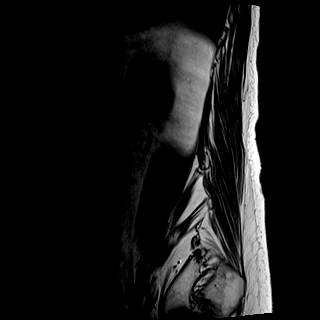

[Series 7: STIR · sagittal · 4.0mm · 0.41mm/px · 2 of 17 slices shown]
[im 1/17]
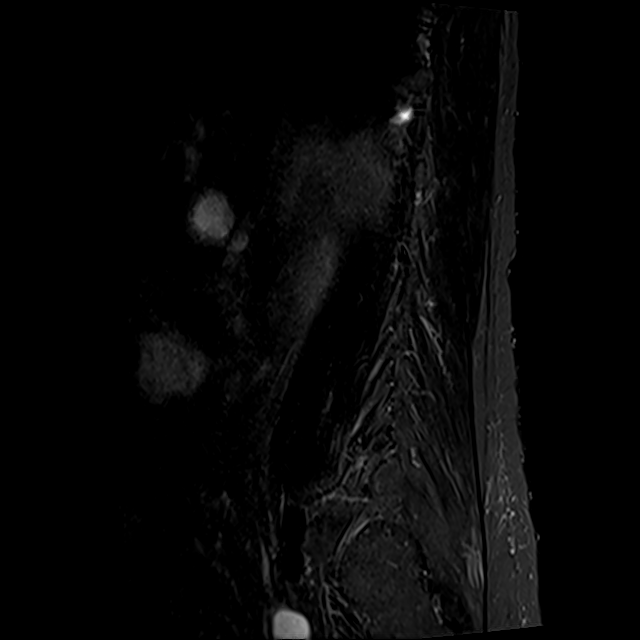
[im 3/17]
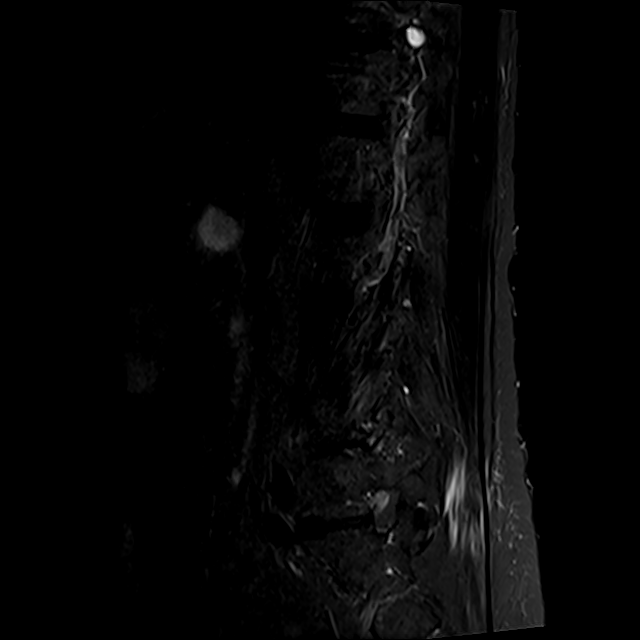

[Series 8: T2 · axial · 4.0mm · 0.78mm/px · z∈[-75,+113]mm · 8 of 34 slices shown (2 of 2)]
[im 1/34]
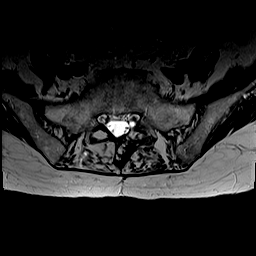
[im 6/34]
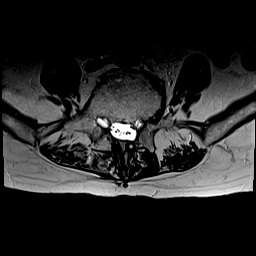
[im 11/34]
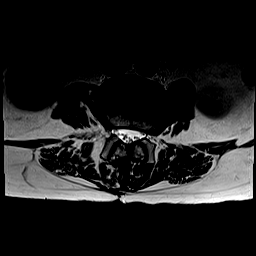
[im 16/34]
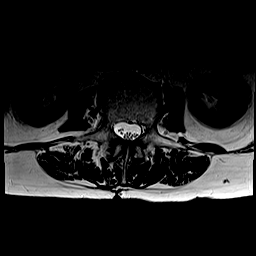
[im 18/34]
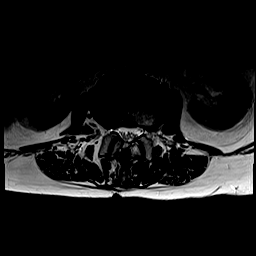
[im 23/34]
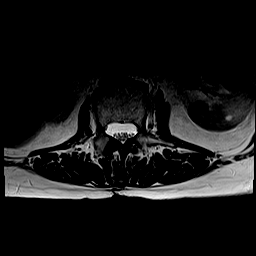
[im 28/34]
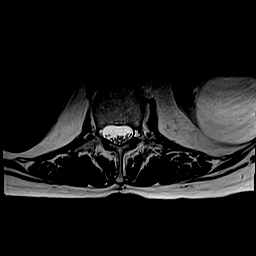
[im 34/34]
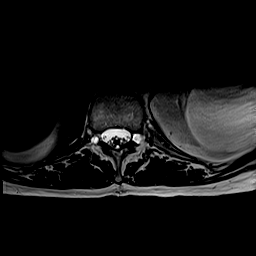

[Series 9: T1 · axial · 4.0mm · 0.39mm/px · z∈[-75,+113]mm · 8 of 34 slices shown (2 of 2)]
[im 1/34]
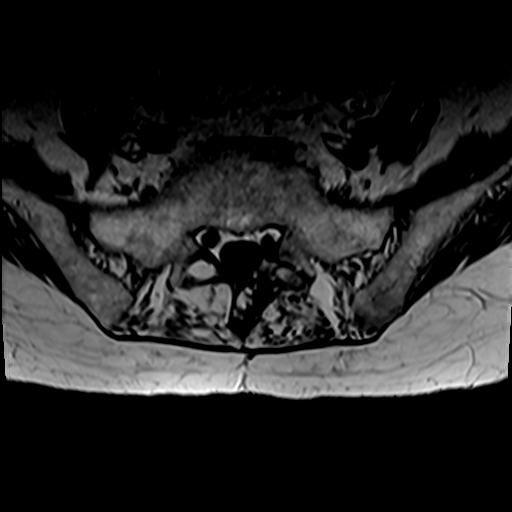
[im 6/34]
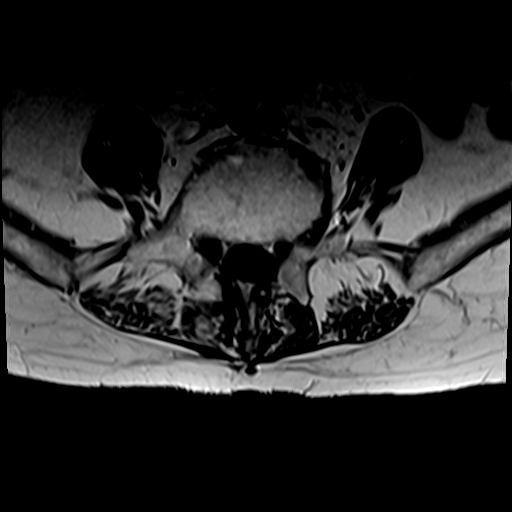
[im 11/34]
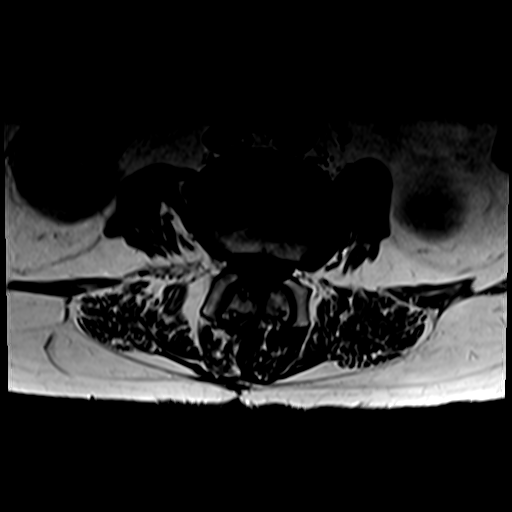
[im 16/34]
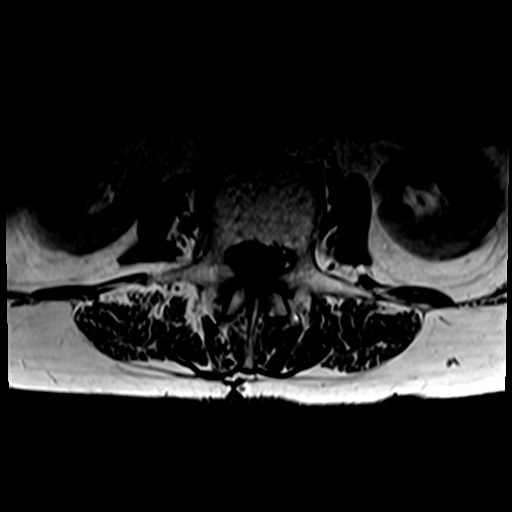
[im 18/34]
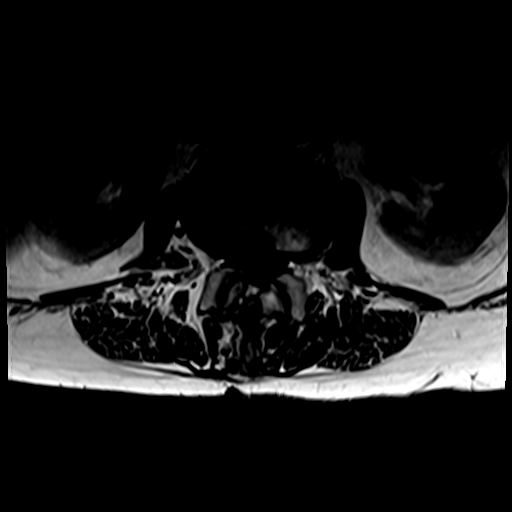
[im 23/34]
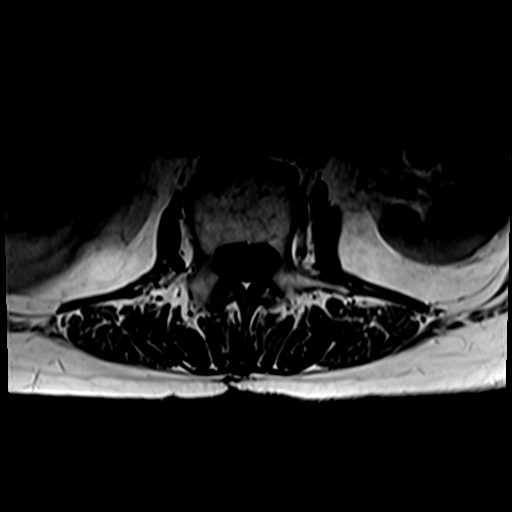
[im 28/34]
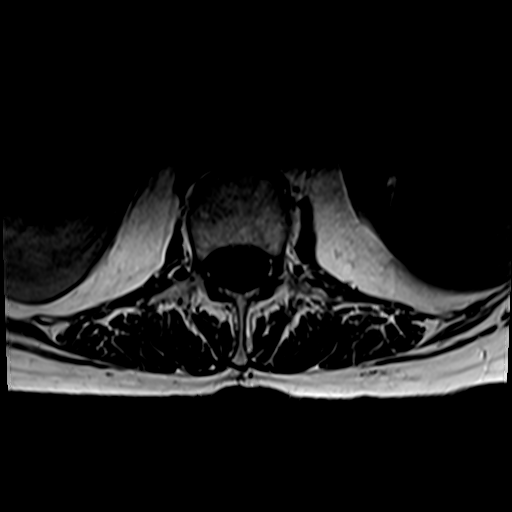
[im 34/34]
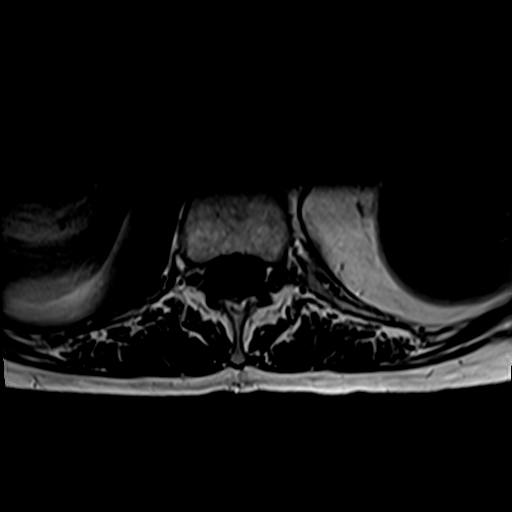

[31 of 48 positions shown; findings below may reference images not displayed]

FINDINGS: Segmentation:  Standard.

Alignment: Alignment is unchanged. Again seen is straightening of
the normal lumbar lordosis and trace anterolisthesis L3 on L4.

Vertebrae:  No fracture, evidence of discitis, or bone lesion.

Conus medullaris and cauda equina: Conus extends to the T12-L1
level. Conus and cauda equina appear normal.

Paraspinal and other soft tissues: Left renal cyst is unchanged.

Disc levels:

T11-12 is imaged in the sagittal plane only and negative.

T12-L1: Negative.

L1-2: Negative.

L2-3: The patient has undergone posterior decompression. Spinal
stenosis has been relieved. Shallow disc bulge persists. The
foramina are open.

L3-4: The patient has undergone posterior decompression. Shallow
disc bulge and superimposed right subarticular recess protrusion are
seen. Central canal and right subarticular recess stenosis on the
prior examination are markedly improved. Neural foramina are open.

L4-5: Laminectomy defects again seen, larger on the right. There is
loss of disc space height. No stenosis.

L5-S1: Shallow disc bulge and endplate spur. The patient is status
post decompression. No stenosis.
IMPRESSION: Status post L2-3 and L3-4 decompression since the prior examination.
Stenosis seen on the prior examinations is markedly improved at both
levels. No new abnormality.

Postoperative change L4-5 and L5-S1 as seen on the prior study. No
stenosis either level.

## 2022-03-21 ENCOUNTER — Ambulatory Visit: Payer: Medicare Other | Admitting: Neurosurgery

## 2022-03-21 ENCOUNTER — Encounter: Payer: Self-pay | Admitting: Neurosurgery

## 2022-03-21 VITALS — BP 140/74 | Ht 67.0 in | Wt 139.8 lb

## 2022-03-21 DIAGNOSIS — G894 Chronic pain syndrome: Secondary | ICD-10-CM
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
## 2022-03-21 NOTE — Addendum Note (Signed)
Addended by: Sharlot Gowda on: 03/21/2022 10:28 AM   Modules accepted: Orders

## 2022-03-21 NOTE — Patient Instructions (Signed)
Dr Rodenbough in [place] [phone] Dr Adam McDermott in [place] - also does televisits [phone] Dr Mount in [place] [phone] Dr Jeffrey Feldman in [place] - also does televisits [phone] [facility] (offices in [place], [place], and [place]) [phone] [facility] - televisits [phone] [facility] - televisits [phone]   Please let us know which one of the above psychologist's you would like to see for evaluation prior to the spinal cord stimulator trial. These are the only providers we are aware of that perform this type of evaluation. Once we fax the referral, you are required to call them to set up an appointment (they will not call you).  

## 2022-03-28 ENCOUNTER — Ambulatory Visit
Admission: RE | Admit: 2022-03-28 | Discharge: 2022-03-28 | Disposition: A | Discharge status: Home / Self Care | Payer: Medicare Other | Source: Ambulatory Visit | Attending: Acute Care | Admitting: Acute Care

## 2022-03-28 DIAGNOSIS — Z87891 Personal history of nicotine dependence: Secondary | ICD-10-CM | POA: Diagnosis present

## 2022-04-04 ENCOUNTER — Ambulatory Visit
Admission: RE | Admit: 2022-04-04 | Discharge: 2022-04-04 | Disposition: A | Discharge status: Home / Self Care | Payer: Medicare Other | Source: Ambulatory Visit | Attending: Neurosurgery | Admitting: Neurosurgery

## 2022-04-04 DIAGNOSIS — G894 Chronic pain syndrome: Secondary | ICD-10-CM

## 2022-04-04 NOTE — Progress Notes (Signed)
Hello [name],  Dr Myer Haff reviewed your thoracic MRI and you are safe to proceed with the spinal cord stimulator.  Take care, Jenny Cowper, RN Neurosurgery

## 2022-04-05 ENCOUNTER — Other Ambulatory Visit: Payer: Self-pay

## 2022-04-05 DIAGNOSIS — Z122 Encounter for screening for malignant neoplasm of respiratory organs: Secondary | ICD-10-CM

## 2022-04-05 DIAGNOSIS — Z87891 Personal history of nicotine dependence: Secondary | ICD-10-CM

## 2022-04-22 ENCOUNTER — Ambulatory Visit: Payer: Medicare Other | Admitting: Student in an Organized Health Care Education/Training Program

## 2022-04-23 ENCOUNTER — Other Ambulatory Visit: Payer: Self-pay | Admitting: Neurosurgery

## 2022-04-23 DIAGNOSIS — M4316 Spondylolisthesis, lumbar region: Secondary | ICD-10-CM

## 2022-04-25 ENCOUNTER — Ambulatory Visit
Admission: RE | Admit: 2022-04-25 | Discharge: 2022-04-25 | Disposition: A | Discharge status: Home / Self Care | Payer: Medicare Other | Source: Ambulatory Visit | Attending: Neurosurgery | Admitting: Neurosurgery

## 2022-04-25 DIAGNOSIS — M4316 Spondylolisthesis, lumbar region: Secondary | ICD-10-CM

## 2022-04-25 MED ORDER — IOPAMIDOL (ISOVUE-M 200) INJECTION 41%
20.0000 mL | Freq: Once | INTRAMUSCULAR | Status: AC
  Administered 2022-04-25: 20 mL via INTRATHECAL

## 2022-04-25 MED ORDER — ONDANSETRON HCL 4 MG/2ML IJ SOLN
4.0000 mg | Freq: Once | INTRAMUSCULAR | Status: AC | PRN
  Administered 2022-04-25: 4 mg via INTRAMUSCULAR

## 2022-04-25 MED ORDER — MEPERIDINE HCL 50 MG/ML IJ SOLN
50.0000 mg | Freq: Once | INTRAMUSCULAR | Status: AC | PRN
  Administered 2022-04-25: 50 mg via INTRAMUSCULAR

## 2022-04-25 MED ORDER — DIAZEPAM 5 MG PO TABS
5.0000 mg | ORAL_TABLET | Freq: Once | ORAL | Status: AC
  Administered 2022-04-25: 5 mg via ORAL

## 2022-04-25 NOTE — Discharge Instructions (Signed)

## 2022-04-25 NOTE — Discharge Instr - Other Orders (Addendum)
13:27 Pain 8/10 post myelogram procedure, discussed/reviewed allergies and pt stated "she would like Demerol/Zofran, allergies listed were from a long time ago", see MAR.  1350: pt reports pain medication has improved her pain some, ranking it 6/10.

## 2022-05-27 ENCOUNTER — Other Ambulatory Visit: Payer: Self-pay | Admitting: Neurosurgery

## 2022-05-31 ENCOUNTER — Other Ambulatory Visit: Payer: Self-pay | Admitting: Neurosurgery

## 2022-06-10 NOTE — Pre-Procedure Instructions (Signed)
Surgical Instructions    Your procedure is scheduled on Thursday October 12.  Report to [facility] Main Entrance "A" at 7:45 A.M., then check in with the Admitting office.  Call this number if you have problems the morning of surgery:  [phone]   If you have any questions prior to your surgery date call [phone]: Open Monday-Friday 8am-4pm If you experience any cold or flu symptoms such as cough, fever, chills, shortness of breath, etc. between now and your scheduled surgery, please notify us at the above number     Remember:  Do not eat or drink after midnight the night before your surgery    Take these medicines the morning of surgery with A SIP OF WATER:  Fluticasone-Umeclidin-Vilant (TRELEGY ELLIPTA) pantoprazole (PROTONIX)  IF NEEDED: COMBIVENT RESPIMAT  HYDROcodone-acetaminophen (NORCO/VICODIN)   Please bring all inhalers with you the day of surgery.   As of today, STOP taking any Aspirin (unless otherwise instructed by your surgeon) Aleve, Naproxen, Ibuprofen, Motrin, Advil, Goody's, BC's, all herbal medications, fish oil, and all vitamins.           Do not wear jewelry or makeup. Do not wear lotions, powders, perfumes or deodorant. Do not shave 48 hours prior to surgery. Do not bring valuables to the hospital. Do not wear nail polish, gel polish, artificial nails, or any other type of covering on natural nails (fingers and toes) If you have artificial nails or gel coating that need to be removed by a nail salon, please have this removed prior to surgery. Artificial nails or gel coating may interfere with anesthesia's ability to adequately monitor your vital signs.  [place] is not responsible for any belongings or valuables.    Do NOT Smoke (Tobacco/Vaping)  24 hours prior to your procedure  If you use a CPAP at night, you may bring your mask for your overnight stay.   Contacts, glasses, hearing aids, dentures or partials may not be worn into surgery,  please bring cases for these belongings   For patients admitted to the hospital, discharge time will be determined by your treatment team.   Patients discharged the day of surgery will not be allowed to drive home, and someone needs to stay with them for 24 hours.   SURGICAL WAITING ROOM VISITATION Patients having surgery or a procedure may have no more than 2 support people in the waiting area - these visitors may rotate.   Children under the age of 45 must have an adult with them who is not the patient. If the patient needs to stay at the hospital during part of their recovery, the visitor guidelines for inpatient rooms apply. Pre-op nurse will coordinate an appropriate time for 1 support person to accompany patient in pre-op.  This support person may not rotate.   Please refer to the [facility] website for the visitor guidelines for Inpatients (after your surgery is over and you are in a regular room).    Special instructions:    Oral Hygiene is also important to reduce your risk of infection.  Remember - BRUSH YOUR TEETH THE MORNING OF SURGERY WITH YOUR REGULAR TOOTHPASTE   - Preparing For Surgery  Before surgery, you can play an important role. Because skin is not sterile, your skin needs to be as free of germs as possible. You can reduce the number of germs on your skin by washing with CHG (chlorahexidine gluconate) Soap before surgery.  CHG is an antiseptic cleaner which kills germs and bonds  with the skin to continue killing germs even after washing.     Please do not use if you have an allergy to CHG or antibacterial soaps. If your skin becomes reddened/irritated stop using the CHG.  Do not shave (including legs and underarms) for at least 48 hours prior to first CHG shower. It is OK to shave your face.  Please follow these instructions carefully.     Shower the NIGHT BEFORE SURGERY and the MORNING OF SURGERY with CHG Soap.   If you chose to wash your hair, wash  your hair first as usual with your normal shampoo. After you shampoo, rinse your hair and body thoroughly to remove the shampoo.  Then [employer] and genitals (private parts) with your normal soap and rinse thoroughly to remove soap.  After that Use CHG Soap as you would any other liquid soap. You can apply CHG directly to the skin and wash gently with a scrungie or a clean washcloth.   Apply the CHG Soap to your body ONLY FROM THE NECK DOWN.  Do not use on open wounds or open sores. Avoid contact with your eyes, ears, mouth and genitals (private parts). Wash Face and genitals (private parts)  with your normal soap.   Wash thoroughly, paying special attention to the area where your surgery will be performed.  Thoroughly rinse your body with warm water from the neck down.  DO NOT shower/wash with your normal soap after using and rinsing off the CHG Soap.  Pat yourself dry with a CLEAN TOWEL.  Wear CLEAN PAJAMAS to bed the night before surgery  Place CLEAN SHEETS on your bed the night before your surgery  DO NOT SLEEP WITH PETS.   Day of Surgery:  Take a shower with CHG soap. Wear Clean/Comfortable clothing the morning of surgery Do not apply any deodorants/lotions.   Remember to brush your teeth WITH YOUR REGULAR TOOTHPASTE.    If you received a COVID test during your pre-op visit, it is requested that you wear a mask when out in public, stay away from anyone that may not be feeling well, and notify your surgeon if you develop symptoms. If you have been in contact with anyone that has tested positive in the last 10 days, please notify your surgeon.    Please read over the following fact sheets that you were given.

## 2022-06-11 NOTE — Pre-Procedure Instructions (Signed)
Surgical Instructions    Your procedure is scheduled on June 20, 2022.  Report to [facility] Main Entrance "A" at 7:45 A.M., then check in with the Admitting office.  Call this number if you have problems the morning of surgery:  [phone]   If you have any questions prior to your surgery date call [phone]: Open Monday-Friday 8am-4pm    Remember:  Do not eat or drink after midnight the night before your surgery      Take these medicines the morning of surgery with A SIP OF WATER:  Fluticasone-Umeclidin-Vilant (TRELEGY ELLIPTA)  pantoprazole (PROTONIX)     Take these medicines the morning of surgery with a sip of water AS NEEDED:  COMBIVENT RESPIMAT 20-100 MCG/ACT AERS respimat  HYDROcodone-acetaminophen (NORCO/VICODIN)    Follow your surgeon's instructions on when to stop Aspirin.  If no instructions were given by your surgeon then you will need to call the office to get those instructions.     As of today, STOP taking any Aleve, Naproxen, Ibuprofen, Motrin, Advil, Goody's, BC's, all herbal medications, fish oil, and all vitamins.                     Do NOT Smoke (Tobacco/Vaping) for 24 hours prior to your procedure.  If you use a CPAP at night, you may bring your mask/headgear for your overnight stay.   Contacts, glasses, piercing's, hearing aid's, dentures or partials may not be worn into surgery, please bring cases for these belongings.    For patients admitted to the hospital, discharge time will be determined by your treatment team.   Patients discharged the day of surgery will not be allowed to drive home, and someone needs to stay with them for 24 hours.  SURGICAL WAITING ROOM VISITATION Patients having surgery or a procedure may have no more than 2 support people in the waiting area - these visitors may rotate.   Children under the age of 31 must have an adult with them who is not the patient. If the patient needs to stay at the hospital during part of  their recovery, the visitor guidelines for inpatient rooms apply. Pre-op nurse will coordinate an appropriate time for 1 support person to accompany patient in pre-op.  This support person may not rotate.   Please refer to the [facility] website for the visitor guidelines for Inpatients (after your surgery is over and you are in a regular room).    Special instructions:   [place]- Preparing For Surgery  Before surgery, you can play an important role. Because skin is not sterile, your skin needs to be as free of germs as possible. You can reduce the number of germs on your skin by washing with CHG (chlorahexidine gluconate) Soap before surgery.  CHG is an antiseptic cleaner which kills germs and bonds with the skin to continue killing germs even after washing.    Oral Hygiene is also important to reduce your risk of infection.  Remember - BRUSH YOUR TEETH THE MORNING OF SURGERY WITH YOUR REGULAR TOOTHPASTE  Please do not use if you have an allergy to CHG or antibacterial soaps. If your skin becomes reddened/irritated stop using the CHG.  Do not shave (including legs and underarms) for at least 48 hours prior to first CHG shower. It is OK to shave your face.  Please follow these instructions carefully.   Shower the NIGHT BEFORE SURGERY and the MORNING OF SURGERY  If you chose to wash your hair,  wash your hair first as usual with your normal shampoo.  After you shampoo, rinse your hair and body thoroughly to remove the shampoo.  Use CHG Soap as you would any other liquid soap. You can apply CHG directly to the skin and wash gently with a scrungie or a clean washcloth.   Apply the CHG Soap to your body ONLY FROM THE NECK DOWN.  Do not use on open wounds or open sores. Avoid contact with your eyes, ears, mouth and genitals (private parts). Wash Face and genitals (private parts)  with your normal soap.   Wash thoroughly, paying special attention to the area where your surgery will be  performed.  Thoroughly rinse your body with warm water from the neck down.  DO NOT shower/wash with your normal soap after using and rinsing off the CHG Soap.  Pat yourself dry with a CLEAN TOWEL.  Wear CLEAN PAJAMAS to bed the night before surgery  Place CLEAN SHEETS on your bed the night before your surgery  DO NOT SLEEP WITH PETS.   Day of Surgery: Take a shower with CHG soap. Do not wear jewelry or makeup Do not wear lotions, powders, perfumes/colognes, or deodorant. Do not shave 48 hours prior to surgery.  Men may shave face and neck. Do not bring valuables to the hospital.  [facility] is not responsible for any belongings or valuables. Do not wear nail polish, gel polish, artificial nails, or any other type of covering on natural nails (fingers and toes) If you have artificial nails or gel coating that need to be removed by a nail salon, please have this removed prior to surgery. Artificial nails or gel coating may interfere with anesthesia's ability to adequately monitor your vital signs.  Wear Clean/Comfortable clothing the morning of surgery Remember to brush your teeth WITH YOUR REGULAR TOOTHPASTE.   Please read over the following fact sheets that you were given.    If you received a COVID test during your pre-op visit  it is requested that you wear a mask when out in public, stay away from anyone that may not be feeling well and notify your surgeon if you develop symptoms. If you have been in contact with anyone that has tested positive in the last 10 days please notify you surgeon.

## 2022-06-12 ENCOUNTER — Encounter (HOSPITAL_COMMUNITY)
Admission: RE | Admit: 2022-06-12 | Discharge: 2022-06-12 | Disposition: A | Discharge status: Home / Self Care | Payer: Medicare Other | Source: Ambulatory Visit | Attending: Neurosurgery | Admitting: Neurosurgery

## 2022-06-12 ENCOUNTER — Other Ambulatory Visit: Payer: Self-pay

## 2022-06-12 ENCOUNTER — Encounter (HOSPITAL_COMMUNITY): Payer: Self-pay

## 2022-06-12 VITALS — BP 184/80 | HR 76 | Temp 97.9°F | Resp 17 | Ht 67.0 in | Wt 142.2 lb

## 2022-06-12 DIAGNOSIS — I2510 Atherosclerotic heart disease of native coronary artery without angina pectoris: Secondary | ICD-10-CM | POA: Diagnosis not present

## 2022-06-12 DIAGNOSIS — Z01818 Encounter for other preprocedural examination: Secondary | ICD-10-CM | POA: Insufficient documentation

## 2022-06-12 LAB — BASIC METABOLIC PANEL
Anion gap: 12 (ref 5–15)
BUN: 11 mg/dL (ref 8–23)
CO2: 27 mmol/L (ref 22–32)
Calcium: 10.2 mg/dL (ref 8.9–10.3)
Chloride: 101 mmol/L (ref 98–111)
Creatinine, Ser: 0.72 mg/dL (ref 0.44–1.00)
GFR, Estimated: >60 mL/min (ref >60)
Glucose, Bld: 97 mg/dL (ref 70–99)
Potassium: 3.7 mmol/L (ref 3.5–5.1)
Sodium: 140 mmol/L (ref 135–145)

## 2022-06-12 LAB — CBC
HCT: 45.3 % (ref 36.0–46.0)
Hemoglobin: 14.7 g/dL (ref 12.0–15.0)
MCH: 30.6 pg (ref 26.0–34.0)
MCHC: 32.5 g/dL (ref 30.0–36.0)
MCV: 94.4 fL (ref 80.0–100.0)
Platelets: 350 10*3/uL (ref 150–400)
RBC: 4.8 MIL/uL (ref 3.87–5.11)
RDW: 14 % (ref 11.5–15.5)
WBC: 10.3 10*3/uL (ref 4.0–10.5)
nRBC: 0 % (ref 0.0–0.2)

## 2022-06-12 LAB — TYPE AND SCREEN
ABO/RH(D): AB POS
Antibody Screen: NEGATIVE

## 2022-06-12 LAB — SURGICAL PCR SCREEN
MRSA, PCR: NEGATIVE
Staphylococcus aureus: NEGATIVE

## 2022-06-12 NOTE — Progress Notes (Signed)
PCP - Dr. Emily Filbert Cardiologist - Pt does not remember cardiologist she saw 10+ years one time.  PPM/ICD - Denies Device Orders - n/a Rep Notified - n/a  Chest x-ray - n/a EKG - 06/12/2022 Stress Test - 10+ years ago, but pt said it was normal. ECHO - 10+ years ago, but pt said it was normal Cardiac Cath - Denies  Sleep Study - Yes.  CPAP - Does not wear CPAP  No DM  Blood Thinner Instructions: n/a Aspirin Instructions: Pt will stop ASA Friday or Saturday (can't remember which day, but has directions at home)  NPO after midnight  COVID TEST- n/a   Anesthesia review: Yes. Abnormal EKG.   Patient denies shortness of breath, fever, cough and chest pain at PAT appointment   All instructions explained to the patient, with a verbal understanding of the material. Patient agrees to go over the instructions while at home for a better understanding. Patient also instructed to self quarantine after being tested for COVID-19. The opportunity to ask questions was provided.
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
## 2022-06-13 NOTE — Anesthesia Preprocedure Evaluation (Addendum)
Anesthesia Evaluation  Patient identified by MRN, date of birth, ID band Patient awake    Reviewed: Allergy & Precautions, NPO status , Patient's Chart, lab work & pertinent test results  Airway Mallampati: II       Dental   Pulmonary sleep apnea , COPD, former smoker   breath sounds clear to auscultation       Cardiovascular hypertension,  Rhythm:Regular Rate:Normal     Neuro/Psych  Neuromuscular disease    GI/Hepatic Neg liver ROS,GERD  ,,  Endo/Other  negative endocrine ROS    Renal/GU negative Renal ROS     Musculoskeletal   Abdominal   Peds  Hematology   Anesthesia Other Findings   Reproductive/Obstetrics                             Anesthesia Physical Anesthesia Plan  ASA: 3  Anesthesia Plan: General   Post-op Pain Management: Tylenol PO (pre-op)*   Induction:   PONV Risk Score and Plan: 3 and Ondansetron, Dexamethasone and Midazolam  Airway Management Planned: Oral ETT  Additional Equipment: Arterial line  Intra-op Plan:   Post-operative Plan: Possible Post-op intubation/ventilation and Extubation in OR  Informed Consent: I have reviewed the patients History and Physical, chart, labs and discussed the procedure including the risks, benefits and alternatives for the proposed anesthesia with the patient or authorized representative who has indicated his/her understanding and acceptance.     Dental advisory given  Plan Discussed with: CRNA and Anesthesiologist  Anesthesia Plan Comments: (PAT note written 06/13/2022 by Myra Gianotti, PA-C. )        Anesthesia Quick Evaluation

## 2022-06-19 NOTE — Progress Notes (Signed)
Patient was called and informed that the surgery time for tomorrow was changed from 09:40 to 07:30 o'clock. Patient was instructed to be at the hospital tomorrow morning at 05;30 o'clock and go to the admitting office. Patient verbalized understanding.

## 2022-06-20 ENCOUNTER — Ambulatory Visit (HOSPITAL_COMMUNITY): Payer: Medicare Other

## 2022-06-20 ENCOUNTER — Ambulatory Visit (HOSPITAL_BASED_OUTPATIENT_CLINIC_OR_DEPARTMENT_OTHER): Payer: Medicare Other | Admitting: Certified Registered Nurse Anesthetist

## 2022-06-20 ENCOUNTER — Ambulatory Visit (HOSPITAL_COMMUNITY)
Admission: RE | Admit: 2022-06-20 | Discharge: 2022-06-21 | Disposition: A | Discharge status: Home / Self Care | Payer: Medicare Other | Attending: Neurosurgery | Admitting: Neurosurgery

## 2022-06-20 ENCOUNTER — Encounter (HOSPITAL_COMMUNITY)
Admission: RE | Disposition: A | Discharge status: Home / Self Care | Payer: Self-pay | Source: Home / Self Care | Attending: Neurosurgery

## 2022-06-20 ENCOUNTER — Other Ambulatory Visit: Payer: Self-pay

## 2022-06-20 ENCOUNTER — Ambulatory Visit (HOSPITAL_COMMUNITY): Payer: Medicare Other | Admitting: Vascular Surgery

## 2022-06-20 DIAGNOSIS — M48062 Spinal stenosis, lumbar region with neurogenic claudication: Secondary | ICD-10-CM | POA: Diagnosis not present

## 2022-06-20 DIAGNOSIS — I10 Essential (primary) hypertension: Secondary | ICD-10-CM | POA: Diagnosis not present

## 2022-06-20 DIAGNOSIS — K219 Gastro-esophageal reflux disease without esophagitis: Secondary | ICD-10-CM | POA: Insufficient documentation

## 2022-06-20 DIAGNOSIS — G709 Myoneural disorder, unspecified: Secondary | ICD-10-CM | POA: Diagnosis not present

## 2022-06-20 DIAGNOSIS — M5116 Intervertebral disc disorders with radiculopathy, lumbar region: Secondary | ICD-10-CM

## 2022-06-20 DIAGNOSIS — G4730 Sleep apnea, unspecified: Secondary | ICD-10-CM | POA: Diagnosis not present

## 2022-06-20 DIAGNOSIS — J449 Chronic obstructive pulmonary disease, unspecified: Secondary | ICD-10-CM | POA: Insufficient documentation

## 2022-06-20 DIAGNOSIS — M4316 Spondylolisthesis, lumbar region: Secondary | ICD-10-CM | POA: Diagnosis not present

## 2022-06-20 DIAGNOSIS — F17210 Nicotine dependence, cigarettes, uncomplicated: Secondary | ICD-10-CM | POA: Insufficient documentation

## 2022-06-20 SURGERY — POSTERIOR LUMBAR FUSION 1 LEVEL
Anesthesia: General | Site: Spine Lumbar

## 2022-06-20 MED ORDER — NICOTINE POLACRILEX 2 MG MT GUM: 2.0000 mg | CHEWING_GUM | OROMUCOSAL | Status: DC | PRN

## 2022-06-20 MED ORDER — SUGAMMADEX SODIUM 200 MG/2ML IV SOLN
INTRAVENOUS | Status: DC | PRN
  Administered 2022-06-20 (×2): 100 mg via INTRAVENOUS

## 2022-06-20 MED ORDER — FENTANYL CITRATE (PF) 100 MCG/2ML IJ SOLN
25.0000 ug | INTRAMUSCULAR | Status: DC | PRN
  Administered 2022-06-20 (×2): 25 ug via INTRAVENOUS

## 2022-06-20 MED ORDER — ACETAMINOPHEN 650 MG RE SUPP: 650.0000 mg | RECTAL | Status: DC | PRN

## 2022-06-20 MED ORDER — PROPOFOL 10 MG/ML IV BOLUS
INTRAVENOUS | Status: AC
  Filled 2022-06-20: qty 20

## 2022-06-20 MED ORDER — UMECLIDINIUM BROMIDE 62.5 MCG/ACT IN AEPB
1.0000 | INHALATION_SPRAY | Freq: Every day | RESPIRATORY_TRACT | Status: DC
  Filled 2022-06-20: qty 7

## 2022-06-20 MED ORDER — CHLORHEXIDINE GLUCONATE 0.12 % MT SOLN: 15.0000 mL | Freq: Once | OROMUCOSAL | Status: AC

## 2022-06-20 MED ORDER — FLUTICASONE FUROATE-VILANTEROL 100-25 MCG/ACT IN AEPB
1.0000 | INHALATION_SPRAY | Freq: Every day | RESPIRATORY_TRACT | Status: DC
  Filled 2022-06-20: qty 28

## 2022-06-20 MED ORDER — THROMBIN 5000 UNITS EX SOLR: OROMUCOSAL | Status: DC | PRN

## 2022-06-20 MED ORDER — EPHEDRINE 5 MG/ML INJ
INTRAVENOUS | Status: AC
  Filled 2022-06-20: qty 5

## 2022-06-20 MED ORDER — ROCURONIUM BROMIDE 10 MG/ML (PF) SYRINGE
PREFILLED_SYRINGE | INTRAVENOUS | Status: DC | PRN
  Administered 2022-06-20: 10 mg via INTRAVENOUS
  Administered 2022-06-20: 60 mg via INTRAVENOUS
  Administered 2022-06-20: 40 mg via INTRAVENOUS

## 2022-06-20 MED ORDER — BUPIVACAINE-EPINEPHRINE (PF) 0.5% -1:200000 IJ SOLN
INTRAMUSCULAR | Status: DC | PRN
  Administered 2022-06-20: 10 mL via PERINEURAL

## 2022-06-20 MED ORDER — BISACODYL 10 MG RE SUPP: 10.0000 mg | Freq: Every day | RECTAL | Status: DC | PRN

## 2022-06-20 MED ORDER — ACETAMINOPHEN 10 MG/ML IV SOLN
INTRAVENOUS | Status: DC | PRN
  Administered 2022-06-20: 1000 mg via INTRAVENOUS

## 2022-06-20 MED ORDER — PHENYLEPHRINE HCL-NACL 20-0.9 MG/250ML-% IV SOLN
INTRAVENOUS | Status: DC | PRN
  Administered 2022-06-20: 25 ug/min via INTRAVENOUS

## 2022-06-20 MED ORDER — THROMBIN 5000 UNITS EX SOLR
CUTANEOUS | Status: AC
  Filled 2022-06-20: qty 5000

## 2022-06-20 MED ORDER — LIDOCAINE 2% (20 MG/ML) 5 ML SYRINGE
INTRAMUSCULAR | Status: DC | PRN
  Administered 2022-06-20: 80 mg via INTRAVENOUS

## 2022-06-20 MED ORDER — FENTANYL CITRATE (PF) 100 MCG/2ML IJ SOLN
INTRAMUSCULAR | Status: AC
  Filled 2022-06-20: qty 2

## 2022-06-20 MED ORDER — OXYCODONE HCL 5 MG PO TABS
5.0000 mg | ORAL_TABLET | ORAL | Status: DC | PRN
  Administered 2022-06-21: 5 mg via ORAL
  Filled 2022-06-20: qty 1

## 2022-06-20 MED ORDER — MONTELUKAST SODIUM 10 MG PO TABS
10.0000 mg | ORAL_TABLET | Freq: Every day | ORAL | Status: DC
  Administered 2022-06-20: 10 mg via ORAL
  Filled 2022-06-20: qty 1

## 2022-06-20 MED ORDER — SUCCINYLCHOLINE CHLORIDE 200 MG/10ML IV SOSY
PREFILLED_SYRINGE | INTRAVENOUS | Status: AC
  Filled 2022-06-20: qty 10

## 2022-06-20 MED ORDER — FENTANYL CITRATE (PF) 250 MCG/5ML IJ SOLN
INTRAMUSCULAR | Status: AC
  Filled 2022-06-20: qty 5

## 2022-06-20 MED ORDER — ROCURONIUM BROMIDE 10 MG/ML (PF) SYRINGE
PREFILLED_SYRINGE | INTRAVENOUS | Status: AC
  Filled 2022-06-20: qty 10

## 2022-06-20 MED ORDER — HYDROCHLOROTHIAZIDE 12.5 MG PO TABS
12.5000 mg | ORAL_TABLET | Freq: Every day | ORAL | Status: DC
  Administered 2022-06-20: 12.5 mg via ORAL
  Filled 2022-06-20: qty 1

## 2022-06-20 MED ORDER — LISINOPRIL-HYDROCHLOROTHIAZIDE 10-12.5 MG PO TABS: 1.0000 | ORAL_TABLET | Freq: Every morning | ORAL | Status: DC

## 2022-06-20 MED ORDER — CEFAZOLIN SODIUM-DEXTROSE 2-4 GM/100ML-% IV SOLN
2.0000 g | Freq: Three times a day (TID) | INTRAVENOUS | Status: AC
  Administered 2022-06-20 (×2): 2 g via INTRAVENOUS
  Filled 2022-06-20 (×2): qty 100

## 2022-06-20 MED ORDER — PANTOPRAZOLE SODIUM 20 MG PO TBEC
20.0000 mg | DELAYED_RELEASE_TABLET | Freq: Every day | ORAL | Status: DC
  Administered 2022-06-21: 20 mg via ORAL
  Filled 2022-06-20: qty 1

## 2022-06-20 MED ORDER — BACITRACIN ZINC 500 UNIT/GM EX OINT
TOPICAL_OINTMENT | CUTANEOUS | Status: AC
  Filled 2022-06-20: qty 28.35

## 2022-06-20 MED ORDER — SODIUM CHLORIDE 0.9% FLUSH
3.0000 mL | Freq: Two times a day (BID) | INTRAVENOUS | Status: DC
  Administered 2022-06-20 (×2): 3 mL via INTRAVENOUS

## 2022-06-20 MED ORDER — ORAL CARE MOUTH RINSE: 15.0000 mL | Freq: Once | OROMUCOSAL | Status: AC

## 2022-06-20 MED ORDER — PHENYLEPHRINE 80 MCG/ML (10ML) SYRINGE FOR IV PUSH (FOR BLOOD PRESSURE SUPPORT)
PREFILLED_SYRINGE | INTRAVENOUS | Status: DC | PRN
  Administered 2022-06-20: 80 ug via INTRAVENOUS
  Administered 2022-06-20: 40 ug via INTRAVENOUS
  Administered 2022-06-20 (×3): 80 ug via INTRAVENOUS
  Administered 2022-06-20: 120 ug via INTRAVENOUS
  Administered 2022-06-20 (×2): 80 ug via INTRAVENOUS
  Administered 2022-06-20: 160 ug via INTRAVENOUS

## 2022-06-20 MED ORDER — SODIUM CHLORIDE 0.9% FLUSH: 3.0000 mL | INTRAVENOUS | Status: DC | PRN

## 2022-06-20 MED ORDER — LIDOCAINE 2% (20 MG/ML) 5 ML SYRINGE
INTRAMUSCULAR | Status: AC
  Filled 2022-06-20: qty 5

## 2022-06-20 MED ORDER — CHLORHEXIDINE GLUCONATE CLOTH 2 % EX PADS: 6.0000 | MEDICATED_PAD | Freq: Once | CUTANEOUS | Status: DC

## 2022-06-20 MED ORDER — CHLORHEXIDINE GLUCONATE 0.12 % MT SOLN
OROMUCOSAL | Status: AC
  Administered 2022-06-20: 15 mL via OROMUCOSAL
  Filled 2022-06-20: qty 15

## 2022-06-20 MED ORDER — BUPIVACAINE LIPOSOME 1.3 % IJ SUSP
INTRAMUSCULAR | Status: AC
  Filled 2022-06-20: qty 20

## 2022-06-20 MED ORDER — PHENYLEPHRINE HCL-NACL 20-0.9 MG/250ML-% IV SOLN
INTRAVENOUS | Status: AC
  Filled 2022-06-20: qty 500

## 2022-06-20 MED ORDER — LACTATED RINGERS IV SOLN: INTRAVENOUS | Status: DC

## 2022-06-20 MED ORDER — 0.9 % SODIUM CHLORIDE (POUR BTL) OPTIME
TOPICAL | Status: DC | PRN
  Administered 2022-06-20: 1000 mL

## 2022-06-20 MED ORDER — SUCCINYLCHOLINE CHLORIDE 200 MG/10ML IV SOSY
PREFILLED_SYRINGE | INTRAVENOUS | Status: DC | PRN
  Administered 2022-06-20: 140 mg via INTRAVENOUS

## 2022-06-20 MED ORDER — ZOLPIDEM TARTRATE 5 MG PO TABS: 5.0000 mg | ORAL_TABLET | Freq: Every evening | ORAL | Status: DC | PRN

## 2022-06-20 MED ORDER — ONDANSETRON HCL 4 MG/2ML IJ SOLN
INTRAMUSCULAR | Status: AC
  Filled 2022-06-20: qty 2

## 2022-06-20 MED ORDER — BACITRACIN ZINC 500 UNIT/GM EX OINT
TOPICAL_OINTMENT | CUTANEOUS | Status: DC | PRN
  Administered 2022-06-20: 1 via TOPICAL

## 2022-06-20 MED ORDER — MENTHOL 3 MG MT LOZG: 1.0000 | LOZENGE | OROMUCOSAL | Status: DC | PRN

## 2022-06-20 MED ORDER — BUPIVACAINE-EPINEPHRINE (PF) 0.5% -1:200000 IJ SOLN
INTRAMUSCULAR | Status: AC
  Filled 2022-06-20: qty 30

## 2022-06-20 MED ORDER — CEFAZOLIN SODIUM-DEXTROSE 2-4 GM/100ML-% IV SOLN
INTRAVENOUS | Status: AC
  Filled 2022-06-20: qty 100

## 2022-06-20 MED ORDER — DEXAMETHASONE SODIUM PHOSPHATE 10 MG/ML IJ SOLN
INTRAMUSCULAR | Status: AC
  Filled 2022-06-20: qty 1

## 2022-06-20 MED ORDER — ONDANSETRON HCL 4 MG/2ML IJ SOLN: 4.0000 mg | Freq: Four times a day (QID) | INTRAMUSCULAR | Status: DC | PRN

## 2022-06-20 MED ORDER — IPRATROPIUM-ALBUTEROL 0.5-2.5 (3) MG/3ML IN SOLN: 3.0000 mL | Freq: Four times a day (QID) | RESPIRATORY_TRACT | Status: DC | PRN

## 2022-06-20 MED ORDER — PROPOFOL 10 MG/ML IV BOLUS
INTRAVENOUS | Status: DC | PRN
  Administered 2022-06-20: 120 mg via INTRAVENOUS
  Administered 2022-06-20: 40 mg via INTRAVENOUS

## 2022-06-20 MED ORDER — ALBUMIN HUMAN 5 % IV SOLN: INTRAVENOUS | Status: DC | PRN

## 2022-06-20 MED ORDER — PHENYLEPHRINE 80 MCG/ML (10ML) SYRINGE FOR IV PUSH (FOR BLOOD PRESSURE SUPPORT)
PREFILLED_SYRINGE | INTRAVENOUS | Status: AC
  Filled 2022-06-20: qty 10

## 2022-06-20 MED ORDER — MORPHINE SULFATE (PF) 4 MG/ML IV SOLN: 4.0000 mg | INTRAVENOUS | Status: DC | PRN

## 2022-06-20 MED ORDER — LISINOPRIL 10 MG PO TABS
10.0000 mg | ORAL_TABLET | Freq: Every day | ORAL | Status: DC
  Administered 2022-06-20: 10 mg via ORAL
  Filled 2022-06-20: qty 1

## 2022-06-20 MED ORDER — BUPIVACAINE LIPOSOME 1.3 % IJ SUSP
INTRAMUSCULAR | Status: DC | PRN
  Administered 2022-06-20: 20 mL

## 2022-06-20 MED ORDER — CEFAZOLIN SODIUM-DEXTROSE 2-4 GM/100ML-% IV SOLN
2.0000 g | INTRAVENOUS | Status: AC
  Administered 2022-06-20: 2 g via INTRAVENOUS

## 2022-06-20 MED ORDER — PHENOL 1.4 % MT LIQD: 1.0000 | OROMUCOSAL | Status: DC | PRN

## 2022-06-20 MED ORDER — ACETAMINOPHEN 10 MG/ML IV SOLN
INTRAVENOUS | Status: AC
  Filled 2022-06-20: qty 100

## 2022-06-20 MED ORDER — ACETAMINOPHEN 500 MG PO TABS
1000.0000 mg | ORAL_TABLET | Freq: Four times a day (QID) | ORAL | Status: AC
  Administered 2022-06-20 – 2022-06-21 (×4): 1000 mg via ORAL
  Filled 2022-06-20 (×4): qty 2

## 2022-06-20 MED ORDER — OXYCODONE HCL 5 MG PO TABS
10.0000 mg | ORAL_TABLET | ORAL | Status: DC | PRN
  Administered 2022-06-20 – 2022-06-21 (×3): 10 mg via ORAL
  Filled 2022-06-20 (×3): qty 2

## 2022-06-20 MED ORDER — LORATADINE 10 MG PO TABS: 10.0000 mg | ORAL_TABLET | Freq: Every evening | ORAL | Status: DC

## 2022-06-20 MED ORDER — LACTATED RINGERS IV SOLN: INTRAVENOUS | Status: DC | PRN

## 2022-06-20 MED ORDER — CYCLOBENZAPRINE HCL 10 MG PO TABS
10.0000 mg | ORAL_TABLET | Freq: Three times a day (TID) | ORAL | Status: DC | PRN
  Administered 2022-06-20 – 2022-06-21 (×2): 10 mg via ORAL
  Filled 2022-06-20 (×2): qty 1

## 2022-06-20 MED ORDER — ONDANSETRON HCL 4 MG/2ML IJ SOLN
INTRAMUSCULAR | Status: DC | PRN
  Administered 2022-06-20: 4 mg via INTRAVENOUS

## 2022-06-20 MED ORDER — HYDROCODONE-ACETAMINOPHEN 5-325 MG PO TABS: 1.0000 | ORAL_TABLET | ORAL | Status: DC | PRN

## 2022-06-20 MED ORDER — FENTANYL CITRATE (PF) 250 MCG/5ML IJ SOLN
INTRAMUSCULAR | Status: DC | PRN
  Administered 2022-06-20: 50 ug via INTRAVENOUS
  Administered 2022-06-20: 25 ug via INTRAVENOUS
  Administered 2022-06-20: 50 ug via INTRAVENOUS
  Administered 2022-06-20: 25 ug via INTRAVENOUS
  Administered 2022-06-20 (×2): 50 ug via INTRAVENOUS

## 2022-06-20 MED ORDER — ONDANSETRON HCL 4 MG PO TABS: 4.0000 mg | ORAL_TABLET | Freq: Four times a day (QID) | ORAL | Status: DC | PRN

## 2022-06-20 MED ORDER — DOCUSATE SODIUM 100 MG PO CAPS
100.0000 mg | ORAL_CAPSULE | Freq: Two times a day (BID) | ORAL | Status: DC
  Administered 2022-06-20 – 2022-06-21 (×2): 100 mg via ORAL
  Filled 2022-06-20 (×2): qty 1

## 2022-06-20 MED ORDER — SODIUM CHLORIDE 0.9 % IV SOLN
250.0000 mL | INTRAVENOUS | Status: DC
  Administered 2022-06-20: 250 mL via INTRAVENOUS

## 2022-06-20 MED ORDER — ACETAMINOPHEN 325 MG PO TABS: 650.0000 mg | ORAL_TABLET | ORAL | Status: DC | PRN

## 2022-06-20 MED ORDER — DEXAMETHASONE SODIUM PHOSPHATE 10 MG/ML IJ SOLN
INTRAMUSCULAR | Status: DC | PRN
  Administered 2022-06-20: 10 mg via INTRAVENOUS

## 2022-06-20 SURGICAL SUPPLY — 66 items
BAG COUNTER SPONGE SURGICOUNT (BAG) ×1 IMPLANT
BASKET BONE COLLECTION (BASKET) ×1 IMPLANT
BENZOIN TINCTURE PRP APPL 2/3 (GAUZE/BANDAGES/DRESSINGS) ×1 IMPLANT
BLADE CLIPPER SURG (BLADE) IMPLANT
BUR MATCHSTICK NEURO 3.0 LAGG (BURR) ×1 IMPLANT
BUR PRECISION FLUTE 6.0 (BURR) ×1 IMPLANT
CANISTER SUCT 3000ML PPV (MISCELLANEOUS) ×1 IMPLANT
CAP LOCK DLX THRD (Cap) IMPLANT
CLSR STERI-STRIP ANTIMIC 1/2X4 (GAUZE/BANDAGES/DRESSINGS) IMPLANT
CNTNR URN SCR LID CUP LEK RST (MISCELLANEOUS) ×1 IMPLANT
CONT SPEC 4OZ STRL OR WHT (MISCELLANEOUS) ×1
COVER BACK TABLE 60X90IN (DRAPES) ×1 IMPLANT
DERMABOND ADVANCED .7 DNX12 (GAUZE/BANDAGES/DRESSINGS) IMPLANT
DRAPE C-ARM 42X72 X-RAY (DRAPES) ×2 IMPLANT
DRAPE HALF SHEET 40X57 (DRAPES) ×1 IMPLANT
DRAPE LAPAROTOMY 100X72X124 (DRAPES) ×1 IMPLANT
DRAPE SURG 17X23 STRL (DRAPES) ×4 IMPLANT
DRSG OPSITE POSTOP 4X6 (GAUZE/BANDAGES/DRESSINGS) ×1 IMPLANT
DRSG OPSITE POSTOP 4X8 (GAUZE/BANDAGES/DRESSINGS) IMPLANT
ELECT BLADE 4.0 EZ CLEAN MEGAD (MISCELLANEOUS) ×1
ELECT REM PT RETURN 9FT ADLT (ELECTROSURGICAL) ×1
ELECTRODE BLDE 4.0 EZ CLN MEGD (MISCELLANEOUS) ×1 IMPLANT
ELECTRODE REM PT RTRN 9FT ADLT (ELECTROSURGICAL) ×1 IMPLANT
EVACUATOR 1/8 PVC DRAIN (DRAIN) IMPLANT
GAUZE 4X4 16PLY ~~LOC~~+RFID DBL (SPONGE) ×1 IMPLANT
GLOVE BIO SURGEON STRL SZ 6 (GLOVE) ×1 IMPLANT
GLOVE BIO SURGEON STRL SZ8 (GLOVE) ×2 IMPLANT
GLOVE BIO SURGEON STRL SZ8.5 (GLOVE) ×2 IMPLANT
GLOVE BIOGEL PI IND STRL 6.5 (GLOVE) ×1 IMPLANT
GLOVE BIOGEL PI IND STRL 7.5 (GLOVE) IMPLANT
GLOVE ECLIPSE 7.0 STRL STRAW (GLOVE) IMPLANT
GLOVE EXAM NITRILE XL STR (GLOVE) IMPLANT
GOWN STRL REUS W/ TWL LRG LVL3 (GOWN DISPOSABLE) ×1 IMPLANT
GOWN STRL REUS W/ TWL XL LVL3 (GOWN DISPOSABLE) ×2 IMPLANT
GOWN STRL REUS W/TWL 2XL LVL3 (GOWN DISPOSABLE) IMPLANT
GOWN STRL REUS W/TWL LRG LVL3 (GOWN DISPOSABLE) ×5
GOWN STRL REUS W/TWL XL LVL3 (GOWN DISPOSABLE) ×2
HEMOSTAT POWDER KIT SURGIFOAM (HEMOSTASIS) ×1 IMPLANT
KIT BASIN OR (CUSTOM PROCEDURE TRAY) ×1 IMPLANT
KIT GRAFTMAG DEL NEURO DISP (NEUROSURGERY SUPPLIES) IMPLANT
KIT TURNOVER KIT B (KITS) ×1 IMPLANT
NDL HYPO 21X1.5 SAFETY (NEEDLE) IMPLANT
NEEDLE HYPO 21X1.5 SAFETY (NEEDLE) IMPLANT
NEEDLE HYPO 22GX1.5 SAFETY (NEEDLE) ×1 IMPLANT
NS IRRIG 1000ML POUR BTL (IV SOLUTION) ×1 IMPLANT
PACK LAMINECTOMY NEURO (CUSTOM PROCEDURE TRAY) ×1 IMPLANT
PAD ARMBOARD 7.5X6 YLW CONV (MISCELLANEOUS) ×3 IMPLANT
PATTIES SURGICAL .5 X1 (DISPOSABLE) IMPLANT
PUTTY DBM 10CC CALC GRAN (Putty) IMPLANT
ROD CURVED TI 6.35X50 (Rod) IMPLANT
SCREW PA DLX CREO 7.5X50 (Screw) IMPLANT
SPACER ALTERA 10X31 8-12MM-8 (Spacer) IMPLANT
SPIKE FLUID TRANSFER (MISCELLANEOUS) ×1 IMPLANT
SPONGE NEURO XRAY DETECT 1X3 (DISPOSABLE) IMPLANT
SPONGE SURGIFOAM ABS GEL 100 (HEMOSTASIS) IMPLANT
SPONGE T-LAP 4X18 ~~LOC~~+RFID (SPONGE) IMPLANT
STRIP CLOSURE SKIN 1/2X4 (GAUZE/BANDAGES/DRESSINGS) ×1 IMPLANT
SUT VIC AB 1 CT1 18XBRD ANBCTR (SUTURE) ×2 IMPLANT
SUT VIC AB 1 CT1 8-18 (SUTURE) ×2
SUT VIC AB 2-0 CP2 18 (SUTURE) ×2 IMPLANT
SYR 20ML LL LF (SYRINGE) IMPLANT
TAP SURG GLBU 6.5X40 (ORTHOPEDIC DISPOSABLE SUPPLIES) IMPLANT
TOWEL GREEN STERILE (TOWEL DISPOSABLE) ×1 IMPLANT
TOWEL GREEN STERILE FF (TOWEL DISPOSABLE) ×1 IMPLANT
TRAY FOLEY MTR SLVR 16FR STAT (SET/KITS/TRAYS/PACK) ×1 IMPLANT
WATER STERILE IRR 1000ML POUR (IV SOLUTION) ×1 IMPLANT

## 2022-06-20 NOTE — Anesthesia Postprocedure Evaluation (Signed)
Anesthesia Post Note  Patient: ARLANDA SHIPLETT  Procedure(s) Performed: POSTERIOR LUMBAR INTERBODY FUSION,INTERBODY PROSTHESIS,POSTERIOR INSTRUMENTATION, LUMABAR THREE-LUMABAR FOUR (Spine Lumbar)     Patient location during evaluation: PACU Anesthesia Type: General Level of consciousness: awake Pain management: pain level controlled Vital Signs Assessment: post-procedure vital signs reviewed and stable Respiratory status: spontaneous breathing Cardiovascular status: stable Postop Assessment: no apparent nausea or vomiting Anesthetic complications: no   No notable events documented.  Last Vitals:  Vitals:   06/20/22 1130 06/20/22 1145  BP: (!) 110/93 108/67  Pulse: 69 70  Resp: 14 15  Temp:    SpO2: 95% 100%    Last Pain:  Vitals:   06/20/22 1145  TempSrc:   PainSc: 10-Worst pain ever                  

## 2022-06-20 NOTE — Anesthesia Procedure Notes (Signed)
Procedure Name: Intubation Date/Time: 06/20/2022 7:42 AM  Performed by: Janene Harvey, CRNAPre-anesthesia Checklist: Patient identified, Emergency Drugs available, Suction available and Patient being monitored Patient Re-evaluated:Patient Re-evaluated prior to induction Oxygen Delivery Method: Circle system utilized Preoxygenation: Pre-oxygenation with 100% oxygen Induction Type: IV induction and Rapid sequence Laryngoscope Size: Glidescope and 3 Grade View: Grade I Tube type: Oral Tube size: 7.0 mm Number of attempts: 1 Airway Equipment and Method: Stylet and Oral airway Placement Confirmation: ETT inserted through vocal cords under direct vision, positive ETCO2 and breath sounds checked- equal and bilateral Secured at: 22 cm Tube secured with: Tape Dental Injury: Teeth and Oropharynx as per pre-operative assessment  Difficulty Due To: Difficult Airway- due to limited oral opening Comments: Elective glidescope
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
## 2022-06-20 NOTE — Transfer of Care (Signed)
Immediate Anesthesia Transfer of Care Note  Patient: Jenny White  Procedure(s) Performed: POSTERIOR LUMBAR INTERBODY FUSION,INTERBODY PROSTHESIS,POSTERIOR INSTRUMENTATION, LUMABAR THREE-LUMABAR FOUR (Spine Lumbar)  Patient Location: PACU  Anesthesia Type:General  Level of Consciousness: drowsy and patient cooperative  Airway & Oxygen Therapy: Patient Spontanous Breathing and Patient connected to face mask oxygen  Post-op Assessment: Report given to RN, Post -op Vital signs reviewed and stable, and Patient moving all extremities X 4  Post vital signs: Reviewed and stable  Last Vitals:  Vitals Value Taken Time  BP    Temp    Pulse    Resp    SpO2      Last Pain:  Vitals:   06/20/22 1610  TempSrc:   PainSc: 0-No pain         Complications: No notable events documented.

## 2022-06-20 NOTE — Op Note (Signed)
Brief history: The patient is a 76 year old white female who has had previous lumbar surgery.  She has developed recurrent back and right leg pain consistent with lumbar radiculopathy.  She was worked up with a lumbar myelo CT which demonstrated a lumbar spine listhesis and foraminal stenosis.  I discussed the various treatment options with her.  She has decided proceed with surgery.  Preoperative diagnosis: Lumbar spondylolisthesis, degenerative disc disease, spinal stenosis compressing both the L3 and the L4 nerve roots; lumbago; lumbar radiculopathy; neurogenic claudication  Postoperative diagnosis: The same  Procedure: Bilateral L3-4 laminotomy/foraminotomies/medial facetectomy to decompress the bilateral L3 and L4 nerve roots(the work required to do this was in addition to the work required to do the posterior lumbar interbody fusion because of the patient's spinal stenosis, facet arthropathy. Etc. requiring a wide decompression of the nerve roots.);  Right L3-4 transforaminal lumbar interbody fusion with local morselized autograft bone and Zimmer DBM; insertion of interbody prosthesis at L3-4 (globus peek expandable interbody prosthesis); posterior nonsegmental instrumentation from L3 to 4 with globus titanium pedicle screws and rods; posterior lateral arthrodesis at L3-4 with local morselized autograft bone and Zimmer DBM.  Surgeon: Dr. Earle Gell  Asst.: Arnetha Massy, NP  Anesthesia: Gen. endotracheal  Estimated blood loss: 200 cc  Drains: None  Complications: None  Description of procedure: The patient was brought to the operating room by the anesthesia team. General endotracheal anesthesia was induced. The patient was turned to the prone position on the Wilson frame. The patient's lumbosacral region was then prepared with Betadine scrub and Betadine solution. Sterile drapes were applied.  I then injected the area to be incised with Marcaine with epinephrine solution. I then used  the scalpel to make a linear midline incision over the L3-4 interspace, incising through the old incision.  I then used electrocautery to perform a bilateral subperiosteal dissection exposing the spinous process and lamina of L3 and L4. We then obtained intraoperative radiograph to confirm our location. We then inserted the Verstrac retractor to provide exposure.  I began the decompression by using the high speed drill to perform laminotomies at L3-4 bilaterally. We then used the Kerrison punches to widen the laminotomy and removed the ligamentum flavum at L3-4 bilaterally. We used the Kerrison punches to remove the medial facets at L3-4 bilaterally, we removed the right L3-4 facet. We performed wide foraminotomies about the bilateral L3 and L4 nerve roots completing the decompression.  We now turned our attention to the posterior lumbar interbody fusion. I used a scalpel to incise the intervertebral disc at L3-4 on the right. I then performed a partial intervertebral discectomy at L3-4 on the right using the pituitary forceps. We prepared the vertebral endplates at [id number] for the fusion by removing the soft tissues with the curettes. We then used the trial spacers to pick the appropriate sized interbody prosthesis. We prefilled his prosthesis with a combination of local morselized autograft bone that we obtained during the decompression as well as Zimmer DBM. We inserted the prefilled prosthesis into the interspace at L3-4 from the right, we then turned and expanded the prosthesis. There was a good snug fit of the prosthesis in the interspace. We then filled and the remainder of the intervertebral disc space with local morselized autograft bone and Zimmer DBM. This completed the posterior lumbar interbody arthrodesis.  During the decompression and insertion of the prosthesis the assistant protected the thecal sac and nerve roots with the D'Errico retractor.  We now turned attention to the instrumentation.  Under  fluoroscopic guidance we cannulated the bilateral L3 and L4 pedicles with the bone probe. We then removed the bone probe. We then tapped the pedicle with a 6.5 millimeter tap. We then removed the tap. We probed inside the tapped pedicle with a ball probe to rule out cortical breaches. We then inserted a 7.5 x 50 millimeter pedicle screw into the L3 and L4 pedicles bilaterally under fluoroscopic guidance. We then palpated along the medial aspect of the pedicles to rule out cortical breaches. There were none. The nerve roots were not injured. We then connected the unilateral pedicle screws with a lordotic rod. We compressed the construct and secured the rod in place with the caps. We then tightened the caps appropriately. This completed the instrumentation from before bilaterally.  We now turned our attention to the posterior lateral arthrodesis at L3-4. We used the high-speed drill to decorticate the remainder of the facets, pars, transverse process at L3-4. We then applied a combination of local morselized autograft bone and Zimmer DBM over these decorticated posterior lateral structures. This completed the posterior lateral arthrodesis.  We then obtained hemostasis using bipolar electrocautery. We irrigated the wound out with bacitracin solution. We inspected the thecal sac and nerve roots and noted they were well decompressed. We then removed the retractor.  We injected Exparel . We reapproximated patient's thoracolumbar fascia with interrupted #1 Vicryl suture. We reapproximated patient's subcutaneous tissue with interrupted 2-0 Vicryl suture. The reapproximated patient's skin with Steri-Strips and benzoin. The wound was then coated with bacitracin ointment. A sterile dressing was applied. The drapes were removed. The patient was subsequently returned to the supine position where they were extubated by the anesthesia team. He was then transported to the post anesthesia care unit in stable condition. All  sponge instrument and needle counts were reportedly correct at the end of this case.

## 2022-06-21 DIAGNOSIS — M4316 Spondylolisthesis, lumbar region: Secondary | ICD-10-CM | POA: Diagnosis not present

## 2022-06-21 MED ORDER — CYCLOBENZAPRINE HCL 5 MG PO TABS: 5.0000 mg | ORAL_TABLET | Freq: Three times a day (TID) | ORAL | Status: DC | PRN

## 2022-06-21 MED ORDER — OXYCODONE-ACETAMINOPHEN 5-325 MG PO TABS: 1.0000 | ORAL_TABLET | ORAL | 0 refills | Status: DC | PRN

## 2022-06-21 MED ORDER — DOCUSATE SODIUM 100 MG PO CAPS: 100.0000 mg | ORAL_CAPSULE | Freq: Two times a day (BID) | ORAL | 0 refills | Status: DC

## 2022-06-21 MED ORDER — OXYCODONE-ACETAMINOPHEN 5-325 MG PO TABS: 1.0000 | ORAL_TABLET | ORAL | Status: DC | PRN

## 2022-06-21 MED ORDER — CYCLOBENZAPRINE HCL 5 MG PO TABS: 5.0000 mg | ORAL_TABLET | Freq: Three times a day (TID) | ORAL | 0 refills | Status: DC | PRN

## 2022-06-21 NOTE — Evaluation (Signed)
Occupational Therapy Evaluation Patient Details Name: Jenny White MRN: [id number] DOB: July 30, 1946 Today's Date: 06/21/2022   History of Present Illness Pt is a 76yo female who underwent L3-4 decompression/fusion on 10/12 due to neurogenic claudication/lumbar riadiculopathy t/o bilat LEs, CT revealed L2-4 sondylolisthesis with spinal stenosis. PMH: COPD  PSH: previous back surgeries, most recent 06/2020. R foot fx, s/p surgery   Clinical Impression   PTA, pt was mod I in ALD and IADL, but was limited in endurance dur to pre-existing COPD. Upon eval, pt performing LB ADL with min guard A and UB ADL with set-up. Pt educated and demonstrating use of compensatory techniques for LB dressing, brace application, shower transfer, grooming, and bed mobility within precautions. All questions answered. All education provided. Recommend discharge home with no follow up OT. Re-consult if change in status. OT to sign off.      Recommendations for follow up therapy are one component of a multi-disciplinary discharge planning process, led by the attending physician.  Recommendations may be updated based on patient status, additional functional criteria and insurance authorization.   Follow Up Recommendations  No OT follow up    Assistance Recommended at Discharge PRN  Patient can return home with the following A little help with bathing/dressing/bathroom;Assistance with cooking/housework;Assist for transportation;Help with stairs or ramp for entrance    Functional Status Assessment  Patient has had a recent decline in their functional status and demonstrates the ability to make significant improvements in function in a reasonable and predictable amount of time.  Equipment Recommendations  BSC/3in1    Recommendations for Other Services       Precautions / Restrictions Precautions Precautions: Back Precaution Booklet Issued: Yes (comment) Precaution Comments: pt with good understanding Required Braces  or Orthoses: Spinal Brace Spinal Brace: Lumbar corset Restrictions Weight Bearing Restrictions: No Other Position/Activity Restrictions: back precautions      Mobility Bed Mobility Overal bed mobility: Needs Assistance Bed Mobility: Rolling, Sidelying to Sit, Sit to Sidelying Rolling: Supervision Sidelying to sit: Supervision     Sit to sidelying: Supervision General bed mobility comments: supervision for use of compensatory techniques. initially bringing feet all the way into bed but able to self-correct without cues and log roll    Transfers Overall transfer level: Needs assistance Equipment used: Rolling walker (2 wheels) Transfers: Sit to/from Stand Sit to Stand: Min guard           General transfer comment: verbal cues for hand placement with at least one hand on sitting surface.      Balance Overall balance assessment: Mild deficits observed, not formally tested                                         ADL either performed or assessed with clinical judgement   ADL Overall ADL's : Needs assistance/impaired Eating/Feeding: Modified independent   Grooming: Oral care;Min guard;Standing   Upper Body Bathing: Set up;Sitting   Lower Body Bathing: Set up;Sit to/from stand   Upper Body Dressing : Set up;Sitting   Lower Body Dressing: Set up;Sit to/from stand Lower Body Dressing Details (indicate cue type and reason): educated on use of compensatory techniques; able to perform figure 4 Toilet Transfer: Min Designer, television/film set (2 wheels) Toilet Transfer Details (indicate cue type and reason): simulated in room   Toileting - Clothing Manipulation Details (indicate cue type and reason): educated regarding compensatory techniques Tub/ Air traffic controller  Transfer: Tub transfer;Min guard;BSC/3in1;Ambulation;Rolling walker (2 wheels) Tub/Shower Transfer Details (indicate cue type and reason): educated regarding use of compensatory techniques. Good  skill to bend at knee only to side step in. Functional mobility during ADLs: Min guard;Rolling walker (2 wheels) General ADL Comments: decreased activity tolerance at baseline.     Vision Baseline Vision/History: 0 No visual deficits Ability to See in Adequate Light: 0 Adequate Patient Visual Report: No change from baseline Vision Assessment?: No apparent visual deficits     Perception     Praxis      Pertinent Vitals/Pain Pain Assessment Pain Assessment: 0-10 Pain Score: 4  Pain Location: surgical site, denies bilat LE pain Pain Descriptors / Indicators: Sore Pain Intervention(s): Monitored during session     Hand Dominance Right   Extremity/Trunk Assessment Upper Extremity Assessment Upper Extremity Assessment: Overall WFL for tasks assessed   Lower Extremity Assessment Lower Extremity Assessment: Generalized weakness   Cervical / Trunk Assessment Cervical / Trunk Assessment: Back Surgery   Communication Communication Communication: No difficulties   Cognition Arousal/Alertness: Awake/alert Behavior During Therapy: WFL for tasks assessed/performed Overall Cognitive Status: Within Functional Limits for tasks assessed                                 General Comments: some short term memory deficits as demo'd by poor carry over for safe hand placement however suspect this to be baseline     General Comments  Pt with mild tremor physician says is from anesthesia    Exercises     Shoulder Instructions      Home Living Family/patient expects to be discharged to:: Private residence Living Arrangements: Children (son, DIL, gransdons 6 and 86) Available Help at Discharge: Family;Available 24 hours/day Type of Home: House Home Access: Stairs to enter [employer] of Steps: 1 Entrance Stairs-Rails: None Home Layout: One level     Bathroom Shower/Tub: Walk-in shower;Tub/shower unit   Bathroom Toilet: Handicapped height     Home  Equipment: None;Shower seat - built in (gave away walker)          Prior Functioning/Environment Prior Level of Function : Independent/Modified Independent;Driving             Mobility Comments: limited activity tolerance due to COPD, pt reports going to [employer] or [employer] to walk for exercises but is limited by her breathing ADLs Comments: indep        OT Problem List: Decreased strength;Decreased activity tolerance;Impaired balance (sitting and/or standing);Decreased knowledge of use of DME or AE;Decreased knowledge of precautions;Pain      OT Treatment/Interventions:      OT Goals(Current goals can be found in the care plan section) Acute Rehab OT Goals Patient Stated Goal: go home OT Goal Formulation: With patient  OT Frequency:      Co-evaluation              AM-PAC OT "6 Clicks" Daily Activity     Outcome Measure Help from another person eating meals?: None Help from another person taking care of personal grooming?: A Little Help from another person toileting, which includes using toliet, bedpan, or urinal?: A Little Help from another person bathing (including washing, rinsing, drying)?: A Little Help from another person to put on and taking off regular upper body clothing?: A Little Help from another person to put on and taking off regular lower body clothing?: A Little 6 Click Score: 19   End  of Session Equipment Utilized During Treatment: Gait belt;Rolling walker (2 wheels);Back brace Nurse Communication: Mobility status  Activity Tolerance: Patient tolerated treatment well Patient left: in bed;with call bell/phone within reach  OT Visit Diagnosis: Unsteadiness on feet (R26.81);Muscle weakness (generalized) (M62.81);Other abnormalities of gait and mobility (R26.89);Pain Pain - part of body:  (operative site)                Time: 6333-5456 OT Time Calculation (min): 20 min Charges:  OT General Charges $OT Visit: 1 Visit OT Evaluation $OT Eval Low  Complexity: 1 Low Shanda Howells, OTR/L [facility] Acute Rehabilitation Office: [phone]   Lula Olszewski 06/21/2022, 9:55 AM

## 2022-06-21 NOTE — Evaluation (Signed)
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 PMH: COPD  PSH: previous back surgeries, most recent 06/2020. R foot fx, s/p surgery   Clinical Impression  Pt admitted with above. Pt functioning at min guard assist. Pt educated on back precautions with good recall. Pt mobility limited more by DOE due to COPD than back surgery itself. Pt with good home set up and support. Acute PT to cont to follow to progress mobility.       Recommendations for follow up therapy are one component of a multi-disciplinary discharge planning process, led by the attending physician.  Recommendations may be updated based on patient status, additional functional criteria and insurance authorization.  Follow Up Recommendations Home health PT      Assistance Recommended at Discharge Frequent or constant Supervision/Assistance  Patient can return home with the following  A little help with walking and/or transfers;A little help with bathing/dressing/bathroom;Help with stairs or ramp for entrance;Assistance with cooking/housework    Equipment Recommendations Rolling walker (2 wheels)  Recommendations for Other Services       Functional Status Assessment Patient has had a recent decline in their functional status and demonstrates the ability to make significant improvements in function in a reasonable and predictable amount of time.     Precautions / Restrictions Precautions Precautions: Back Precaution Booklet Issued: Yes (comment) Precaution Comments: pt with good understanding Required Braces or Orthoses: Spinal Brace (aspen) Spinal Brace: Lumbar corset Restrictions Weight Bearing Restrictions: No Other  Position/Activity Restrictions: back precautions      Mobility  Bed Mobility               General bed mobility comments: pt sitting EOB upon PT arrival eating breakfast. went over back precautions and using log rolling technique for in/out of bed, pt reports yes i remembered after i tried to pull myself up to eat breakfast    Transfers Overall transfer level: Needs assistance Equipment used: Rolling walker (2 wheels) Transfers: Sit to/from Stand Sit to Stand: Min guard           General transfer comment: verbal cues for safe hand placement, pt reports increased pain when pushing up with bilat UEs on bed, comprimised with pushing up with L UE and puting R UE on RW    Ambulation/Gait Ambulation/Gait assistance: Min guard Gait Distance (Feet): 120 Feet Assistive device: Rolling walker (2 wheels) Gait Pattern/deviations: Step-through pattern, Decreased stride length Gait velocity: slow Gait velocity interpretation: <1.31 ft/sec, indicative of household ambulator   General Gait Details: slow, guarded, DOE however this is normal due to COPD, SpO2 91% on RA, good upright posture but noted increased dependence on bilat UEs  Stairs            Wheelchair Mobility    Modified Rankin (Stroke Patients Only)       Balance Overall balance assessment: Mild deficits observed, not formally tested (pt stood at sink to wash hands without difficulty)                                           Pertinent Vitals/Pain Pain Assessment Pain Assessment: 0-10 Pain Score: 4  Pain Location: surgical  site, denies bilat LE pain Pain Descriptors / Indicators: Sore Pain Intervention(s): Monitored during session    Home Living Family/patient expects to be discharged to:: Private residence Living Arrangements: Children (son, DIL, and grandsons 68 and 18yo) Available Help at Discharge: Family;Available 24 hours/day Type of Home: House Home Access: Stairs to  enter Entrance Stairs-Rails: None Entrance Stairs-Number of Steps: 1   Home Layout: One level Home Equipment: None;Shower seat - built in (gave away walker)      Prior Function Prior Level of Function : Independent/Modified Independent;Driving             Mobility Comments: limited activity tolerance due to COPD, pt reports going to [place] or [employer] to walk for exercises but is limited by her breathing ADLs Comments: indep     Hand Dominance   Dominant Hand: Right    Extremity/Trunk Assessment   Upper Extremity Assessment Upper Extremity Assessment: Overall WFL for tasks assessed    Lower Extremity Assessment Lower Extremity Assessment: Generalized weakness    Cervical / Trunk Assessment Cervical / Trunk Assessment: Back Surgery  Communication   Communication: No difficulties  Cognition Arousal/Alertness: Awake/alert Behavior During Therapy: WFL for tasks assessed/performed Overall Cognitive Status: Within Functional Limits for tasks assessed                                 General Comments: some short term memory deficits as demo'd by poor carry over for safe hand placement however suspect this to be baseline        General Comments General comments (skin integrity, edema, etc.): pt with noted tremors, RN and MD states its from coming off anesthesia. pt assisted to bathroom, pt supervision with hygiene s/p tolieting    Exercises     Assessment/Plan    PT Assessment Patient needs continued PT services  PT Problem List Decreased strength;Decreased activity tolerance;Decreased balance;Decreased mobility;Decreased safety awareness;Decreased knowledge of use of DME       PT Treatment Interventions DME instruction;Gait training;Stair training;Functional mobility training;Therapeutic activities;Therapeutic exercise;Balance training    PT Goals (Current goals can be found in the Care Plan section)  Acute Rehab PT Goals Patient Stated Goal: home PT  Goal Formulation: With patient Time For Goal Achievement: 07/05/22 Potential to Achieve Goals: Good    Frequency Min 5X/week     Co-evaluation               AM-PAC PT "6 Clicks" Mobility  Outcome Measure Help needed turning from your back to your side while in a flat bed without using bedrails?: A Little Help needed moving from lying on your back to sitting on the side of a flat bed without using bedrails?: A Little Help needed moving to and from a bed to a chair (including a wheelchair)?: A Little Help needed standing up from a chair using your arms (e.g., wheelchair or bedside chair)?: A Little Help needed to walk in hospital room?: A Little Help needed climbing 3-5 steps with a railing? : A Little 6 Click Score: 18    End of Session Equipment Utilized During Treatment: Back brace Activity Tolerance: Patient tolerated treatment well Patient left: in bed;with call bell/phone within reach (sitting EOB to eat breakfast) Nurse Communication: Mobility status PT Visit Diagnosis: Muscle weakness (generalized) (M62.81);Difficulty in walking, not elsewhere classified (R26.2)    Time: 0720-0757 PT Time Calculation (min) (ACUTE ONLY): 37 min   Charges:   PT Evaluation $PT  Eval Moderate Complexity: 1 Mod PT Treatments $Gait Training: 8-22 mins        Lewis Shock, PT, DPT Acute Rehabilitation Services Secure chat preferred Office #: [phone]   Iona Hansen 06/21/2022, 8:20 AM

## 2022-06-21 NOTE — TOC Transition Note (Signed)
Transition of Care [facility]) - CM/SW Discharge Note   Patient Details  Name: Jenny White MRN: [id number] Date of Birth: 06/27/1946  [facility]) CM/SW Contact:  Geralynn Ochs, LCSW Phone Number: 06/21/2022, 10:45 AM   Clinical Narrative:   CSW met with patient to discuss home health. She is in agreement, no preference for agency. Referral provided to [facility], they can accept. Information placed on AVS. No other TOC needs at this time.     Final next level of care: [place] Barriers to Discharge: Barriers Resolved   Patient Goals and CMS Choice Patient states their goals for this hospitalization and ongoing recovery are:: to get home CMS Medicare.gov Compare Post Acute Care list provided to:: Patient Choice offered to / list presented to : Patient  Discharge Placement                Patient to be transferred to facility by: Family Name of family member notified: Self Patient and family notified of of transfer: 06/21/22  Discharge Plan and Services                          HH Arranged: PT, OT [facility] Agency: [place] Date [place]: 06/21/22   Representative spoke with at [place]: [place] ([place]) Interventions     Readmission Risk Interventions     No data to display

## 2022-06-21 NOTE — Discharge Summary (Signed)
Physician Discharge Summary  Patient ID: Jenny White MRN: [id number] DOB/AGE: 1946/01/18 76 y.o.  Admit date: 06/20/2022 Discharge date: 06/21/2022  Admission Diagnoses: Lumbar spondylolisthesis, lumbar spinal stenosis, lumbar foraminal stenosis, lumbar radiculopathy, neurogenic medication, lumbago  Discharge Diagnoses: The same Principal Problem:   Spondylolisthesis of lumbar region   Discharged Condition: good  Hospital Course: I performed an L3-4 decompression, instrumentation and fusion on the patient on 06/20/2022.  The surgery went well.  The patient's postoperative course was unremarkable.  On postoperative day #1 she requested discharge home.  Her son and daughter-in-law live with her.  She was given verbal and written discharge instructions.  All her questions were answered.  Consults: PT, OT, care management Significant Diagnostic Studies: None Treatments: L3-4 decompression, instrumentation and fusion. Discharge Exam: Blood pressure (!) 119/54, pulse 73, temperature 98.3 F (36.8 C), temperature source Oral, resp. rate 18, height 5\' 7"  (1.702 m), weight 64 kg, SpO2 95 %. The patient is alert and pleasant.  She looks well.  Her strength is normal.  Disposition: Home  Discharge Instructions     Call MD for:  difficulty breathing, headache or visual disturbances   Complete by: As directed    Call MD for:  extreme fatigue   Complete by: As directed    Call MD for:  hives   Complete by: As directed    Call MD for:  persistant dizziness or light-headedness   Complete by: As directed    Call MD for:  persistant nausea and vomiting   Complete by: As directed    Call MD for:  redness, tenderness, or signs of infection (pain, swelling, redness, odor or green/yellow discharge around incision site)   Complete by: As directed    Call MD for:  severe uncontrolled pain   Complete by: As directed    Call MD for:  temperature >100.4   Complete by: As directed    Diet - low  sodium heart healthy   Complete by: As directed    Discharge instructions   Complete by: As directed    Call [phone] for a followup appointment. Take a stool softener while you are using pain medications.   Driving Restrictions   Complete by: As directed    Do not drive for 2 weeks.   Increase activity slowly   Complete by: As directed    Lifting restrictions   Complete by: As directed    Do not lift more than 5 pounds. No excessive bending or twisting.   May shower / Bathe   Complete by: As directed    Remove the dressing for 3 days after surgery.  You may shower, but leave the incision alone.   Remove dressing in 48 hours   Complete by: As directed       Allergies as of 06/21/2022       Reactions   Demerol Hcl [meperidine] Nausea And Vomiting, Nausea Only, Other (See Comments)   Percocet [oxycodone-acetaminophen] Nausea And Vomiting        Medication List     STOP taking these medications    HYDROcodone-acetaminophen 5-325 MG tablet Commonly known as: NORCO/VICODIN       TAKE these medications    aspirin EC 81 MG tablet Take 81 mg by mouth every evening. Swallow whole.   cholecalciferol 25 MCG (1000 UNIT) tablet Commonly known as: VITAMIN D3 Take 1,000 Units by mouth every evening.   Combivent Respimat 20-100 MCG/ACT Aers respimat Generic drug: Ipratropium-Albuterol Inhale 1 puff into the  lungs 4 (four) times daily as needed for shortness of breath.   cyclobenzaprine 5 MG tablet Commonly known as: FLEXERIL Take 1 tablet (5 mg total) by mouth 3 (three) times daily as needed for muscle spasms.   docusate sodium 100 MG capsule Commonly known as: COLACE Take 1 capsule (100 mg total) by mouth 2 (two) times daily.   lisinopril-hydrochlorothiazide 10-12.5 MG tablet Commonly known as: ZESTORETIC Take 1 tablet by mouth in the morning.   loratadine 10 MG tablet Commonly known as: CLARITIN Take 10 mg by mouth every evening.   magnesium oxide 400 (240  Mg) MG tablet Commonly known as: MAG-OX Take 400 mg by mouth daily at 12 noon.   montelukast 10 MG tablet Commonly known as: SINGULAIR Take 10 mg by mouth at bedtime.   nicotine polacrilex 2 MG gum Commonly known as: NICORETTE Take 2 mg by mouth as needed for smoking cessation.   oxyCODONE-acetaminophen 5-325 MG tablet Commonly known as: PERCOCET/ROXICET Take 1-2 tablets by mouth every 4 (four) hours as needed for moderate pain.   pantoprazole 20 MG tablet Commonly known as: PROTONIX Take 20 mg by mouth daily before breakfast.   Trelegy Ellipta 100-62.5-25 MCG/ACT Aepb Generic drug: Fluticasone-Umeclidin-Vilant Inhale 1 puff into the lungs daily.         Signed: Cristi Loron 06/21/2022, 6:36 AM

## 2022-06-21 NOTE — Progress Notes (Signed)
Patient alert and oriented, mae's well, voiding adequate amount of urine, swallowing without difficulty, no c/o pain at time of discharge. Patient discharged home with family. Script and discharged instructions given to patient. Patient and family stated understanding of instructions given. Patient has an appointment with Dr. Jenkins   

## 2022-06-24 DIAGNOSIS — Z9289 Personal history of other medical treatment: Secondary | ICD-10-CM

## 2022-06-24 HISTORY — DX: Personal history of other medical treatment: Z92.89

## 2022-06-27 MED FILL — Sodium Chloride IV Soln 0.9%: INTRAVENOUS | Qty: 1000 | Status: AC

## 2022-06-27 MED FILL — Heparin Sodium (Porcine) Inj 1000 Unit/ML: INTRAMUSCULAR | Qty: 30 | Status: AC

## 2022-07-31 ENCOUNTER — Other Ambulatory Visit: Payer: Self-pay | Admitting: Neurosurgery

## 2022-07-31 DIAGNOSIS — M4316 Spondylolisthesis, lumbar region: Secondary | ICD-10-CM

## 2022-08-05 ENCOUNTER — Other Ambulatory Visit: Payer: Self-pay

## 2022-08-05 ENCOUNTER — Emergency Department (HOSPITAL_COMMUNITY)
Admission: EM | Admit: 2022-08-05 | Discharge: 2022-08-05 | Discharge status: Against Medical Advice | Payer: Medicare Other | Attending: Emergency Medicine | Admitting: Emergency Medicine

## 2022-08-05 ENCOUNTER — Emergency Department (HOSPITAL_COMMUNITY): Payer: Medicare Other

## 2022-08-05 DIAGNOSIS — M545 Low back pain, unspecified: Secondary | ICD-10-CM | POA: Diagnosis not present

## 2022-08-05 DIAGNOSIS — Z5321 Procedure and treatment not carried out due to patient leaving prior to being seen by health care provider: Secondary | ICD-10-CM | POA: Insufficient documentation

## 2022-08-05 LAB — CBC WITH DIFFERENTIAL/PLATELET
Abs Immature Granulocytes: 0.02 10*3/uL (ref 0.00–0.07)
Basophils Absolute: 0.1 10*3/uL (ref 0.0–0.1)
Basophils Relative: 1 %
Eosinophils Absolute: 0.2 10*3/uL (ref 0.0–0.5)
Eosinophils Relative: 2 %
HCT: 44.6 % (ref 36.0–46.0)
Hemoglobin: 14.4 g/dL (ref 12.0–15.0)
Immature Granulocytes: 0 %
Lymphocytes Relative: 31 %
Lymphs Abs: 3 10*3/uL (ref 0.7–4.0)
MCH: 30.2 pg (ref 26.0–34.0)
MCHC: 32.3 g/dL (ref 30.0–36.0)
MCV: 93.5 fL (ref 80.0–100.0)
Monocytes Absolute: 1.6 10*3/uL — ABNORMAL HIGH (ref 0.1–1.0)
Monocytes Relative: 17 %
Neutro Abs: 4.7 10*3/uL (ref 1.7–7.7)
Neutrophils Relative %: 49 %
Platelets: 410 10*3/uL — ABNORMAL HIGH (ref 150–400)
RBC: 4.77 MIL/uL (ref 3.87–5.11)
RDW: 15.9 % — ABNORMAL HIGH (ref 11.5–15.5)
WBC: 9.5 10*3/uL (ref 4.0–10.5)
nRBC: 0 % (ref 0.0–0.2)

## 2022-08-05 LAB — COMPREHENSIVE METABOLIC PANEL
ALT: 12 U/L (ref 0–44)
AST: 17 U/L (ref 15–41)
Albumin: 4.2 g/dL (ref 3.5–5.0)
Alkaline Phosphatase: 54 U/L (ref 38–126)
Anion gap: 14 (ref 5–15)
BUN: 14 mg/dL (ref 8–23)
CO2: 26 mmol/L (ref 22–32)
Calcium: 10.4 mg/dL — ABNORMAL HIGH (ref 8.9–10.3)
Chloride: 99 mmol/L (ref 98–111)
Creatinine, Ser: 0.66 mg/dL (ref 0.44–1.00)
GFR, Estimated: >60 mL/min (ref >60)
Glucose, Bld: 102 mg/dL — ABNORMAL HIGH (ref 70–99)
Potassium: 3.8 mmol/L (ref 3.5–5.1)
Sodium: 139 mmol/L (ref 135–145)
Total Bilirubin: 0.6 mg/dL (ref 0.3–1.2)
Total Protein: 7.2 g/dL (ref 6.5–8.1)

## 2022-08-05 LAB — C-REACTIVE PROTEIN: CRP: <0.5 mg/dL (ref <1.0)

## 2022-08-05 LAB — SEDIMENTATION RATE: Sed Rate: 7 mm/hr (ref 0–22)

## 2022-08-05 MED ORDER — ONDANSETRON 4 MG PO TBDP: 4.0000 mg | ORAL_TABLET | Freq: Once | ORAL | Status: DC

## 2022-08-05 MED ORDER — HYDROCODONE-ACETAMINOPHEN 5-325 MG PO TABS: 1.0000 | ORAL_TABLET | Freq: Once | ORAL | Status: DC

## 2022-08-05 MED ORDER — GADOBUTROL 1 MMOL/ML IV SOLN
6.0000 mL | Freq: Once | INTRAVENOUS | Status: AC | PRN
  Administered 2022-08-05: 6 mL via INTRAVENOUS

## 2022-08-05 MED ORDER — HYDROCODONE-ACETAMINOPHEN 5-325 MG PO TABS
2.0000 | ORAL_TABLET | Freq: Once | ORAL | Status: AC
  Administered 2022-08-05: 2 via ORAL
  Filled 2022-08-05: qty 2

## 2022-08-05 MED ORDER — LORAZEPAM 1 MG PO TABS
1.0000 mg | ORAL_TABLET | Freq: Once | ORAL | Status: AC
  Administered 2022-08-05: 1 mg via ORAL
  Filled 2022-08-05: qty 1

## 2022-08-05 NOTE — ED Triage Notes (Signed)
Pt presents with left lower back pain radiating down left leg.  Status post lumbar fusion 7 weeks ago.  Pain has previously been on the right.  Sudden onset on the left side down left leg starting 1 week ago  Pt is uncontrolled with home medications. Unable to stand d/t pain. Denies bowel or bladder incontinence.

## 2022-08-05 NOTE — ED Notes (Signed)
Pt family member remains irate, not willing to stay in ED, argumentative, reports she is taking patient home. Encouragement and support provided.

## 2022-08-05 NOTE — ED Provider Triage Note (Signed)
Emergency Medicine Provider Triage Evaluation Note  Jenny White , a 76 y.o. female  was evaluated in triage.  Pt complains of severe back pain.  Status post lumbar fusion 7 weeks ago.  Patient states that she has a history of chronic back pain and all of her pain used to be on the right side.  She has been doing well, progressing through PT and finally got off her walker when she had sudden onset of severe pain in the left back going down the left leg starting 1 week ago.  Pain is now so severe is uncontrolled with her home medications she has been unable to stand up due to worsening in pain with change in position and standing upright.  Denies red flag symptoms.  Review of Systems  Positive: Back pain Negative: Fever  Physical Exam  BP (!) 159/101   Pulse (!) 106   Temp 98.2 F (36.8 C) (Oral)   Resp 19   SpO2 94%  Gen:   Awake, no distress    Resp:  Normal effort   MSK:   Moves extremities without difficulty   Other:  Normal reflexes   Medical Decision Making  Medically screening exam initiated at 4:59 PM.  Appropriate orders placed.  MARKEE REMLINGER was informed that the remainder of the evaluation will be completed by another provider, this initial triage assessment does not replace that evaluation, and the importance of remaining in the ED until their evaluation is complete.     Arthor Captain, PA-C 08/05/22 1702

## 2022-08-05 NOTE — ED Notes (Signed)
Patient family member very upset over wait time. Very argumentative and using foul language with sort RN. Patient family member states "if I leave here and call 911 will I get into a room?" Explained that we can try to manage symptoms and pain until a room becomes available. States "this is bullshit." Sort RN trying to reassure patient family member that we are trying to accommodate and treat everyone.

## 2022-08-05 NOTE — ED Notes (Signed)
Patient requesting medicine for nausea. Triage RN notified

## 2022-08-07 ENCOUNTER — Other Ambulatory Visit: Payer: Medicare Other

## 2023-03-31 ENCOUNTER — Ambulatory Visit
Admission: RE | Admit: 2023-03-31 | Discharge: 2023-03-31 | Disposition: A | Discharge status: Home / Self Care | Payer: Medicare Other | Source: Ambulatory Visit | Attending: Internal Medicine | Admitting: Internal Medicine

## 2023-03-31 DIAGNOSIS — E049 Nontoxic goiter, unspecified: Secondary | ICD-10-CM | POA: Insufficient documentation

## 2023-03-31 DIAGNOSIS — R918 Other nonspecific abnormal finding of lung field: Secondary | ICD-10-CM | POA: Insufficient documentation

## 2023-03-31 DIAGNOSIS — Z87891 Personal history of nicotine dependence: Secondary | ICD-10-CM | POA: Diagnosis present

## 2023-03-31 DIAGNOSIS — J432 Centrilobular emphysema: Secondary | ICD-10-CM | POA: Diagnosis not present

## 2023-03-31 DIAGNOSIS — M4856XA Collapsed vertebra, not elsewhere classified, lumbar region, initial encounter for fracture: Secondary | ICD-10-CM | POA: Insufficient documentation

## 2023-03-31 DIAGNOSIS — M8589 Other specified disorders of bone density and structure, multiple sites: Secondary | ICD-10-CM | POA: Insufficient documentation

## 2023-03-31 DIAGNOSIS — Z122 Encounter for screening for malignant neoplasm of respiratory organs: Secondary | ICD-10-CM | POA: Diagnosis present

## 2023-03-31 DIAGNOSIS — I700 Atherosclerosis of aorta: Secondary | ICD-10-CM | POA: Insufficient documentation

## 2023-03-31 DIAGNOSIS — I2510 Atherosclerotic heart disease of native coronary artery without angina pectoris: Secondary | ICD-10-CM | POA: Diagnosis not present

## 2023-04-04 ENCOUNTER — Other Ambulatory Visit: Payer: Self-pay | Admitting: Student

## 2023-04-04 DIAGNOSIS — M5416 Radiculopathy, lumbar region: Secondary | ICD-10-CM

## 2023-04-08 ENCOUNTER — Telehealth: Payer: Self-pay | Admitting: Nurse Practitioner

## 2023-04-08 ENCOUNTER — Ambulatory Visit
Admission: RE | Admit: 2023-04-08 | Discharge: 2023-04-08 | Disposition: A | Discharge status: Home / Self Care | Payer: Medicare Other | Source: Ambulatory Visit | Attending: Student | Admitting: Student

## 2023-04-08 DIAGNOSIS — M5416 Radiculopathy, lumbar region: Secondary | ICD-10-CM | POA: Insufficient documentation

## 2023-04-08 MED ORDER — GADOBUTROL 1 MMOL/ML IV SOLN
6.0000 mL | Freq: Once | INTRAVENOUS | Status: AC | PRN
  Administered 2023-04-08: 6 mL via INTRAVENOUS

## 2023-04-08 NOTE — Telephone Encounter (Signed)
Results received through lung screening dashboard:  IMPRESSION: 1. Lung-RADS 4B, suspicious. Additional imaging evaluation or consultation with Pulmonology or Thoracic Surgery recommended. Substantial interval growth of large poorly marginated part solid posterior right lower lobe 5.7 cm lung mass with 3.3 cm solid component, highly suspicious for primary bronchogenic adenocarcinoma. New thick-walled cavitary 1.0 cm central left upper lobe pulmonary nodule, cannot exclude a metachronous primary malignancy. PET-CT recommended for further characterization. 2. No thoracic adenopathy. 3. Three-vessel coronary atherosclerosis. 4. Aortic Atherosclerosis (ICD10-I70.0) and Emphysema (ICD10-J43.9).

## 2023-04-08 NOTE — Telephone Encounter (Signed)
Spoke to [employer] with [place] radiology. She is aware that a message has been sent to lung nodule pool.

## 2023-04-08 NOTE — Telephone Encounter (Signed)
CT Scan CALL Report

## 2023-04-09 ENCOUNTER — Telehealth: Payer: Self-pay | Admitting: Acute Care

## 2023-04-09 NOTE — Telephone Encounter (Signed)
I have attempted to call the patient with the results of their  Low Dose CT Chest Lung cancer screening scan. There was no answer. I have left a HIPPA compliant VM requesting the patient call the office for the scan results. I included the office contact information in the message. We will await his return call. If no return call we will continue to call until patient is contacted.   D, D and E. This is a 4 B and needs a PET scan. I will order PET once I have spoken with the patient. We can let her PCP know after we contact the patient. I called her home phone and it went to a fast busy signal. The mobile went to VM. Thanks

## 2023-04-10 ENCOUNTER — Other Ambulatory Visit: Payer: Self-pay | Admitting: Acute Care

## 2023-04-10 ENCOUNTER — Telehealth: Payer: Self-pay | Admitting: Acute Care

## 2023-04-10 ENCOUNTER — Other Ambulatory Visit: Payer: Self-pay | Admitting: Neurosurgery

## 2023-04-10 DIAGNOSIS — M5416 Radiculopathy, lumbar region: Secondary | ICD-10-CM

## 2023-04-10 DIAGNOSIS — R911 Solitary pulmonary nodule: Secondary | ICD-10-CM

## 2023-04-10 NOTE — Telephone Encounter (Signed)
Results/ plans faxed to PCP. Will await PET results.  

## 2023-04-10 NOTE — Telephone Encounter (Signed)
I have called the patient with the results of her low dose Ct Chest. I explained that there has been substantial interval growth of large poorly marginated part solid posterior right lower lobe 5.7 cm lung mass with 3.3 cm solid component, highly suspicious for primary bronchogenic adenocarcinoma. New thick-walled cavitary 1.0 cm central left upper lobe pulmonary nodule, cannot exclude a metachronous primary malignancy. Last years LDCT was read as a LR 2, at that time the area of concern was 1.5 cm.  Pt. States she has been having significant back pain. She  had an MR Lumbar Spine 04/08/23 which has not been read yet.  I explained that next step is for a PET scan to better evaluate this area of concern. She is in agreement with this plan. I have ordered the PET scan. She understands she will get a call to get this scheduled, and also a call for her follow up in our office.  D, D, And E, please fax results to PCP and let them know plan for follow up. Thanks so much

## 2023-04-18 ENCOUNTER — Ambulatory Visit
Admission: RE | Admit: 2023-04-18 | Discharge: 2023-04-18 | Disposition: A | Discharge status: Home / Self Care | Payer: Medicare Other | Source: Ambulatory Visit | Attending: Acute Care | Admitting: Acute Care

## 2023-04-18 ENCOUNTER — Ambulatory Visit
Admission: RE | Admit: 2023-04-18 | Discharge: 2023-04-18 | Disposition: A | Discharge status: Home / Self Care | Payer: Medicare Other | Source: Ambulatory Visit | Attending: Neurosurgery | Admitting: Neurosurgery

## 2023-04-18 DIAGNOSIS — R911 Solitary pulmonary nodule: Secondary | ICD-10-CM

## 2023-04-18 DIAGNOSIS — M5416 Radiculopathy, lumbar region: Secondary | ICD-10-CM | POA: Diagnosis present

## 2023-04-18 LAB — GLUCOSE, CAPILLARY: Glucose-Capillary: 91 mg/dL (ref 70–99)

## 2023-04-28 ENCOUNTER — Ambulatory Visit: Payer: Medicare Other | Admitting: Pulmonary Disease

## 2023-04-28 ENCOUNTER — Ambulatory Visit
Admission: RE | Admit: 2023-04-28 | Discharge: 2023-04-28 | Disposition: A | Discharge status: Home / Self Care | Payer: Medicare Other | Source: Ambulatory Visit | Attending: Acute Care | Admitting: Acute Care

## 2023-04-28 VITALS — Ht 67.0 in | Wt 132.0 lb

## 2023-04-28 DIAGNOSIS — I2510 Atherosclerotic heart disease of native coronary artery without angina pectoris: Secondary | ICD-10-CM | POA: Diagnosis not present

## 2023-04-28 DIAGNOSIS — I700 Atherosclerosis of aorta: Secondary | ICD-10-CM | POA: Insufficient documentation

## 2023-04-28 DIAGNOSIS — J439 Emphysema, unspecified: Secondary | ICD-10-CM | POA: Diagnosis not present

## 2023-04-28 DIAGNOSIS — Z122 Encounter for screening for malignant neoplasm of respiratory organs: Secondary | ICD-10-CM | POA: Insufficient documentation

## 2023-04-28 DIAGNOSIS — C3412 Malignant neoplasm of upper lobe, left bronchus or lung: Secondary | ICD-10-CM | POA: Diagnosis not present

## 2023-04-28 DIAGNOSIS — R911 Solitary pulmonary nodule: Secondary | ICD-10-CM | POA: Insufficient documentation

## 2023-04-28 DIAGNOSIS — N200 Calculus of kidney: Secondary | ICD-10-CM | POA: Diagnosis not present

## 2023-04-28 DIAGNOSIS — M4316 Spondylolisthesis, lumbar region: Secondary | ICD-10-CM

## 2023-04-28 LAB — GLUCOSE, CAPILLARY: Glucose-Capillary: 101 mg/dL — ABNORMAL HIGH (ref 70–99)

## 2023-04-28 MED ORDER — FLUDEOXYGLUCOSE F - 18 (FDG) INJECTION
7.5000 | Freq: Once | INTRAVENOUS | Status: AC | PRN
  Administered 2023-04-28: 7.44 via INTRAVENOUS

## 2023-05-07 ENCOUNTER — Encounter: Payer: Self-pay | Admitting: Pulmonary Disease

## 2023-05-07 ENCOUNTER — Ambulatory Visit (INDEPENDENT_AMBULATORY_CARE_PROVIDER_SITE_OTHER): Payer: Medicare Other | Admitting: Pulmonary Disease

## 2023-05-07 VITALS — BP 140/80 | HR 84 | Ht 67.0 in | Wt 129.2 lb

## 2023-05-07 DIAGNOSIS — R911 Solitary pulmonary nodule: Secondary | ICD-10-CM | POA: Diagnosis not present

## 2023-05-07 DIAGNOSIS — Z87891 Personal history of nicotine dependence: Secondary | ICD-10-CM

## 2023-05-07 DIAGNOSIS — Z122 Encounter for screening for malignant neoplasm of respiratory organs: Secondary | ICD-10-CM | POA: Diagnosis not present

## 2023-05-07 DIAGNOSIS — R918 Other nonspecific abnormal finding of lung field: Secondary | ICD-10-CM | POA: Insufficient documentation

## 2023-05-07 DIAGNOSIS — R942 Abnormal results of pulmonary function studies: Secondary | ICD-10-CM

## 2023-05-07 NOTE — Patient Instructions (Signed)
Thank you for visiting Dr. Tonia Brooms at [facility]. Today we recommend the following:  Orders Placed This Encounter  Procedures   Procedural/ Surgical Case Request: ROBOTIC ASSISTED NAVIGATIONAL BRONCHOSCOPY, VIDEO BRONCHOSCOPY WITH ENDOBRONCHIAL ULTRASOUND   Ambulatory referral to Pulmonology   Return in about 13 days (around 05/20/2023) for with Kandice Robinsons, NP, after Bronchoscopy.    Please do your part to reduce the spread of COVID-19.
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
## 2023-05-09 ENCOUNTER — Encounter (HOSPITAL_COMMUNITY): Payer: Self-pay | Admitting: Pulmonary Disease

## 2023-05-09 ENCOUNTER — Other Ambulatory Visit: Payer: Self-pay

## 2023-05-09 NOTE — Progress Notes (Signed)
SDW call  Patient was given pre-op instructions over the phone. Patient verbalized understanding of instructions provided.     PCP - Dr. Sheryn Bison Cardiologist -  Pulmonary:    PPM/ICD - denies Device Orders - n/a Rep Notified - n/a   Chest x-ray - n/a EKG -  06/12/2022 Stress Test - ECHO -  Cardiac Cath -   Sleep Study/sleep apnea/CPAP: denies  Non-diabetic  Blood Thinner Instructions: denies Aspirin Instructions: States last dose 05/08/2023   ERAS Protcol - No, NPO  COVID TEST- n/a    Anesthesia review: No   Patient denies shortness of breath, fever, cough and chest pain over the phone call  Your procedure is scheduled on Tuesday May 13, 2023  Report to [facility] Main Entrance "A" at  1300  PM, then check in with the Admitting office.  Call this number if you have problems the morning of surgery:  [phone]   If you have any questions prior to your surgery date call [phone]: Open Monday-Friday 8am-4pm If you experience any cold or flu symptoms such as cough, fever, chills, shortness of breath, etc. between now and your scheduled surgery, please notify us at the above number     Remember:  Do not eat or drink after midnight the night before your surgery  Take these medicines the morning of surgery with A SIP OF WATER:  Trelegy ellipta, protonix  As needed: Combivent, flexeril, norco  As of today, STOP taking any Aspirin (unless otherwise instructed by your surgeon) Aleve, Naproxen, Ibuprofen, Motrin, Advil, Goody's, BC's, all herbal medications, fish oil, and all vitamins.

## 2023-05-13 ENCOUNTER — Encounter (HOSPITAL_COMMUNITY): Payer: Self-pay | Admitting: Anesthesiology

## 2023-05-13 ENCOUNTER — Encounter (HOSPITAL_COMMUNITY)
Admission: RE | Disposition: A | Discharge status: Home / Self Care | Payer: Self-pay | Source: Ambulatory Visit | Attending: Pulmonary Disease

## 2023-05-13 ENCOUNTER — Other Ambulatory Visit: Payer: Self-pay

## 2023-05-13 ENCOUNTER — Ambulatory Visit (HOSPITAL_COMMUNITY)
Admission: RE | Admit: 2023-05-13 | Discharge: 2023-05-13 | Disposition: A | Discharge status: Home / Self Care | Payer: Medicare Other | Source: Ambulatory Visit | Attending: Pulmonary Disease | Admitting: Pulmonary Disease

## 2023-05-13 ENCOUNTER — Encounter (HOSPITAL_COMMUNITY): Payer: Self-pay | Admitting: Pulmonary Disease

## 2023-05-13 DIAGNOSIS — Z538 Procedure and treatment not carried out for other reasons: Secondary | ICD-10-CM | POA: Diagnosis not present

## 2023-05-13 DIAGNOSIS — R911 Solitary pulmonary nodule: Secondary | ICD-10-CM | POA: Insufficient documentation

## 2023-05-13 DIAGNOSIS — R918 Other nonspecific abnormal finding of lung field: Secondary | ICD-10-CM | POA: Insufficient documentation

## 2023-05-13 DIAGNOSIS — I10 Essential (primary) hypertension: Secondary | ICD-10-CM

## 2023-05-13 DIAGNOSIS — Z01818 Encounter for other preprocedural examination: Secondary | ICD-10-CM

## 2023-05-13 LAB — BASIC METABOLIC PANEL
Anion gap: 13 (ref 5–15)
BUN: 16 mg/dL (ref 8–23)
CO2: 25 mmol/L (ref 22–32)
Calcium: 10.1 mg/dL (ref 8.9–10.3)
Chloride: 100 mmol/L (ref 98–111)
Creatinine, Ser: 0.7 mg/dL (ref 0.44–1.00)
GFR, Estimated: >60 mL/min (ref >60)
Glucose, Bld: 93 mg/dL (ref 70–99)
Potassium: 3.5 mmol/L (ref 3.5–5.1)
Sodium: 138 mmol/L (ref 135–145)

## 2023-05-13 LAB — CBC
HCT: 42.4 % (ref 36.0–46.0)
Hemoglobin: 14.1 g/dL (ref 12.0–15.0)
MCH: 30.9 pg (ref 26.0–34.0)
MCHC: 33.3 g/dL (ref 30.0–36.0)
MCV: 92.8 fL (ref 80.0–100.0)
Platelets: 345 10*3/uL (ref 150–400)
RBC: 4.57 MIL/uL (ref 3.87–5.11)
RDW: 15.6 % — ABNORMAL HIGH (ref 11.5–15.5)
WBC: 9.1 10*3/uL (ref 4.0–10.5)
nRBC: 0 % (ref 0.0–0.2)

## 2023-05-13 SURGERY — CANCELLED PROCEDURE
Laterality: Bilateral

## 2023-05-13 MED ORDER — ACETAMINOPHEN 500 MG PO TABS: 1000.0000 mg | ORAL_TABLET | Freq: Once | ORAL | Status: AC

## 2023-05-13 MED ORDER — CHLORHEXIDINE GLUCONATE 0.12 % MT SOLN
OROMUCOSAL | Status: AC
  Administered 2023-05-13: 15 mL
  Filled 2023-05-13: qty 15

## 2023-05-13 MED ORDER — LACTATED RINGERS IV SOLN: INTRAVENOUS | Status: DC

## 2023-05-13 MED ORDER — ACETAMINOPHEN 500 MG PO TABS
ORAL_TABLET | ORAL | Status: AC
  Administered 2023-05-13: 1000 mg via ORAL
  Filled 2023-05-13: qty 2

## 2023-05-13 SURGICAL SUPPLY — 28 items
BRUSH CYTOL CELLEBRITY 1.5X140 (MISCELLANEOUS) IMPLANT
CANISTER SUCT 3000ML PPV (MISCELLANEOUS) ×2 IMPLANT
CONT SPEC 4OZ CLIKSEAL STRL BL (MISCELLANEOUS) ×2 IMPLANT
COVER BACK TABLE 60X90IN (DRAPES) ×2 IMPLANT
COVER DOME SNAP 22 D (MISCELLANEOUS) ×2 IMPLANT
FORCEPS BIOP RJ4 1.8 (CUTTING FORCEPS) IMPLANT
GAUZE SPONGE 4X4 12PLY STRL (GAUZE/BANDAGES/DRESSINGS) ×2 IMPLANT
GLOVE BIO SURGEON STRL SZ7.5 (GLOVE) ×2 IMPLANT
GOWN STRL REUS W/ TWL LRG LVL3 (GOWN DISPOSABLE) ×2 IMPLANT
GOWN STRL REUS W/TWL LRG LVL3 (GOWN DISPOSABLE) ×2
KIT CLEAN ENDO COMPLIANCE (KITS) ×4 IMPLANT
KIT TURNOVER KIT B (KITS) ×2 IMPLANT
MARKER SKIN DUAL TIP RULER LAB (MISCELLANEOUS) ×2 IMPLANT
NEEDLE EBUS SONO TIP PENTAX (NEEDLE) ×2 IMPLANT
NS IRRIG 1000ML POUR BTL (IV SOLUTION) ×2 IMPLANT
OIL SILICONE PENTAX (PARTS (SERVICE/REPAIRS)) ×2 IMPLANT
PAD ARMBOARD 7.5X6 YLW CONV (MISCELLANEOUS) ×4 IMPLANT
SOL ANTI FOG 6CC (MISCELLANEOUS) ×2 IMPLANT
SYR 20CC LL (SYRINGE) ×4 IMPLANT
SYR 20ML ECCENTRIC (SYRINGE) ×4 IMPLANT
SYR 50ML SLIP (SYRINGE) IMPLANT
SYR 5ML LUER SLIP (SYRINGE) ×2 IMPLANT
TOWEL OR 17X24 6PK STRL BLUE (TOWEL DISPOSABLE) ×2 IMPLANT
TRAP SPECIMEN MUCOUS 40CC (MISCELLANEOUS) IMPLANT
TUBE CONNECTING 20X1/4 (TUBING) ×4 IMPLANT
UNDERPAD 30X30 (UNDERPADS AND DIAPERS) ×2 IMPLANT
VALVE DISPOSABLE (MISCELLANEOUS) ×2 IMPLANT
WATER STERILE IRR 1000ML POUR (IV SOLUTION) ×2 IMPLANT

## 2023-05-13 NOTE — H&P (Signed)
PCCM:  Unfortunately the patient's case today was canceled due to other cases running longer today and multiple delays that were out of our control.  Additionally we do not have resources or personnel to extend the time to allow for the procedure to be complete today.  Patient was very understanding and we will work to get them scheduled in the future as soon as possible.  Josephine Igo, DO [facility] 05/13/2023 5:17 PM

## 2023-05-13 NOTE — Progress Notes (Signed)
Surgeon cancelled case due to availability. Patient dc'd to home with family.

## 2023-05-13 NOTE — Anesthesia Preprocedure Evaluation (Signed)
Anesthesia Evaluation  Patient identified by MRN, date of birth, ID band Patient awake    Reviewed: Allergy & Precautions, NPO status , Patient's Chart, lab work & pertinent test results  Airway       Comment: Previous grade II view with MAC 3 and easy mask, grade I view with glidescope 3 Dental   Pulmonary sleep apnea , COPD, former smoker Lung nodules          Cardiovascular hypertension (lisinopril-HCTZ), Pt. on medications      Neuro/Psych  Neuromuscular disease (lumbar stenosis)    GI/Hepatic ,GERD  Medicated,,  Endo/Other    Renal/GU      Musculoskeletal  (+) Arthritis ,    Abdominal   Peds  Hematology   Anesthesia Other Findings   Reproductive/Obstetrics                              Anesthesia Physical Anesthesia Plan  ASA: 3  Anesthesia Plan: General   Post-op Pain Management: Tylenol PO (pre-op)*   Induction: Intravenous  PONV Risk Score and Plan: 3 and Ondansetron, Dexamethasone and Treatment may vary due to age or medical condition  Airway Management Planned: Oral ETT  Additional Equipment:   Intra-op Plan:   Post-operative Plan: Extubation in OR  Informed Consent:      Dental advisory given  Plan Discussed with: Anesthesiologist and CRNA  Anesthesia Plan Comments: (Risks of general anesthesia discussed including, but not limited to, sore throat, hoarse voice, chipped/damaged teeth, injury to vocal cords, nausea and vomiting, allergic reactions, lung infection, heart attack, stroke, and death. All questions answered. )         Anesthesia Quick Evaluation

## 2023-05-14 ENCOUNTER — Encounter: Payer: Self-pay | Admitting: Pulmonary Disease

## 2023-05-14 ENCOUNTER — Other Ambulatory Visit: Payer: Self-pay | Admitting: Neurosurgery

## 2023-05-14 DIAGNOSIS — S32020G Wedge compression fracture of second lumbar vertebra, subsequent encounter for fracture with delayed healing: Secondary | ICD-10-CM

## 2023-05-20 ENCOUNTER — Ambulatory Visit: Payer: Medicare Other | Admitting: Acute Care

## 2023-05-28 ENCOUNTER — Other Ambulatory Visit: Payer: Self-pay | Admitting: Internal Medicine

## 2023-05-28 ENCOUNTER — Other Ambulatory Visit: Payer: Self-pay | Admitting: Neurosurgery

## 2023-05-28 DIAGNOSIS — Z1231 Encounter for screening mammogram for malignant neoplasm of breast: Secondary | ICD-10-CM

## 2023-05-28 DIAGNOSIS — E2839 Other primary ovarian failure: Secondary | ICD-10-CM

## 2023-05-28 DIAGNOSIS — S32020G Wedge compression fracture of second lumbar vertebra, subsequent encounter for fracture with delayed healing: Secondary | ICD-10-CM

## 2023-05-28 DIAGNOSIS — M810 Age-related osteoporosis without current pathological fracture: Secondary | ICD-10-CM

## 2023-06-05 ENCOUNTER — Encounter (HOSPITAL_COMMUNITY): Payer: Self-pay | Admitting: Pulmonary Disease

## 2023-06-05 ENCOUNTER — Other Ambulatory Visit: Payer: Self-pay

## 2023-06-05 NOTE — Progress Notes (Signed)
PCP - Dr Bethann Punches Cardiologist - none Pulmonology - Kandice Robinsons, NP  CT Chest x-ray - 03/31/23 EKG - 06/12/22 Stress Test - Normal per pt, greater than 10 yrs ago ECHO - Normal per pt, greater than 10 yrs ago Cardiac Cath - n/a  ICD Pacemaker/Loop - n/a  Sleep Study -  Yes CPAP - does not use CPAP  Diabetes - n/a  Aspirin Instructions: Follow your surgeon's instructions on when to stop aspirin prior to surgery,  If no instructions were given by your surgeon then you will need to call the office for those instructions.  NPO  Anesthesia review: Yes  STOP now taking any Aspirin (unless otherwise instructed by your surgeon), Aleve, Naproxen, Ibuprofen, Motrin, Advil, Goody's, BC's, all herbal medications, fish oil, and all vitamins.   Coronavirus Screening Do you have any of the following symptoms:  Cough yes/no: No Fever (>100.52F)  yes/no: No Runny nose yes/no: No Sore throat yes/no: No Difficulty breathing/shortness of breath  Yes  Have you traveled in the last 14 days and where? yes/no: No  Patient verbalized understanding of instructions that were given via phone.

## 2023-06-06 ENCOUNTER — Encounter (HOSPITAL_COMMUNITY): Payer: Self-pay | Admitting: Pulmonary Disease

## 2023-06-08 NOTE — Anesthesia Preprocedure Evaluation (Signed)
Anesthesia Evaluation  Patient identified by MRN, date of birth, ID band Patient awake    Reviewed: Allergy & Precautions, NPO status , Patient's Chart, lab work & pertinent test results  History of Anesthesia Complications Negative for: history of anesthetic complications  Airway Mallampati: III  TM Distance: >3 FB Neck ROM: Full  Mouth opening: Limited Mouth Opening  Dental  (+) Chipped,    Pulmonary sleep apnea (noncompliant with CPAP) , COPD,  COPD inhaler, former smoker Lung nodules   Pulmonary exam normal        Cardiovascular hypertension, Pt. on medications Normal cardiovascular exam     Neuro/Psych    GI/Hepatic Neg liver ROS,GERD  Medicated,,  Endo/Other  negative endocrine ROS    Renal/GU negative Renal ROS     Musculoskeletal  (+) Arthritis ,    Abdominal   Peds  Hematology negative hematology ROS (+)   Anesthesia Other Findings Day of surgery medications reviewed with patient.  Reproductive/Obstetrics                             Anesthesia Physical Anesthesia Plan  ASA: 3  Anesthesia Plan: General   Post-op Pain Management: Minimal or no pain anticipated   Induction: Intravenous  PONV Risk Score and Plan: 3 and Treatment may vary due to age or medical condition, Ondansetron, Dexamethasone, Propofol infusion and TIVA  Airway Management Planned: Oral ETT  Additional Equipment: None  Intra-op Plan:   Post-operative Plan: Extubation in OR  Informed Consent: I have reviewed the patients History and Physical, chart, labs and discussed the procedure including the risks, benefits and alternatives for the proposed anesthesia with the patient or authorized representative who has indicated his/her understanding and acceptance.     Dental advisory given  Plan Discussed with: CRNA  Anesthesia Plan Comments:         Anesthesia Quick Evaluation

## 2023-06-09 ENCOUNTER — Ambulatory Visit (HOSPITAL_COMMUNITY): Payer: Medicare Other | Admitting: Physician Assistant

## 2023-06-09 ENCOUNTER — Encounter (HOSPITAL_COMMUNITY)
Admission: RE | Disposition: A | Discharge status: Home / Self Care | Payer: Self-pay | Source: Ambulatory Visit | Attending: Emergency Medicine

## 2023-06-09 ENCOUNTER — Ambulatory Visit (HOSPITAL_COMMUNITY)
Admission: RE | Admit: 2023-06-09 | Discharge: 2023-06-09 | Disposition: A | Discharge status: Home / Self Care | Payer: Medicare Other | Source: Ambulatory Visit | Attending: Emergency Medicine | Admitting: Emergency Medicine

## 2023-06-09 ENCOUNTER — Ambulatory Visit (HOSPITAL_COMMUNITY): Payer: Medicare Other

## 2023-06-09 ENCOUNTER — Encounter (HOSPITAL_COMMUNITY): Payer: Self-pay | Admitting: Emergency Medicine

## 2023-06-09 ENCOUNTER — Ambulatory Visit (HOSPITAL_COMMUNITY): Payer: Self-pay | Admitting: Physician Assistant

## 2023-06-09 DIAGNOSIS — K219 Gastro-esophageal reflux disease without esophagitis: Secondary | ICD-10-CM | POA: Insufficient documentation

## 2023-06-09 DIAGNOSIS — C3432 Malignant neoplasm of lower lobe, left bronchus or lung: Secondary | ICD-10-CM | POA: Diagnosis present

## 2023-06-09 DIAGNOSIS — J449 Chronic obstructive pulmonary disease, unspecified: Secondary | ICD-10-CM | POA: Insufficient documentation

## 2023-06-09 DIAGNOSIS — Z87891 Personal history of nicotine dependence: Secondary | ICD-10-CM | POA: Insufficient documentation

## 2023-06-09 DIAGNOSIS — G4730 Sleep apnea, unspecified: Secondary | ICD-10-CM | POA: Insufficient documentation

## 2023-06-09 DIAGNOSIS — C3490 Malignant neoplasm of unspecified part of unspecified bronchus or lung: Secondary | ICD-10-CM

## 2023-06-09 DIAGNOSIS — R918 Other nonspecific abnormal finding of lung field: Secondary | ICD-10-CM | POA: Diagnosis present

## 2023-06-09 DIAGNOSIS — I10 Essential (primary) hypertension: Secondary | ICD-10-CM | POA: Insufficient documentation

## 2023-06-09 HISTORY — DX: Failed or difficult intubation, initial encounter: T88.4XXA

## 2023-06-09 HISTORY — PX: BRONCHIAL BIOPSY: SHX5109

## 2023-06-09 HISTORY — PX: BRONCHIAL BRUSHINGS: SHX5108

## 2023-06-09 HISTORY — DX: Malignant neoplasm of unspecified part of unspecified bronchus or lung: C34.90

## 2023-06-09 HISTORY — PX: VIDEO BRONCHOSCOPY WITH ENDOBRONCHIAL ULTRASOUND: SHX6177

## 2023-06-09 HISTORY — PX: FIDUCIAL MARKER PLACEMENT: SHX6858

## 2023-06-09 HISTORY — PX: BRONCHIAL NEEDLE ASPIRATION BIOPSY: SHX5106

## 2023-06-09 SURGERY — BRONCHOSCOPY, WITH BIOPSY USING ELECTROMAGNETIC NAVIGATION
Anesthesia: General | Laterality: Bilateral

## 2023-06-09 MED ORDER — DEXAMETHASONE SODIUM PHOSPHATE 10 MG/ML IJ SOLN
INTRAMUSCULAR | Status: DC | PRN
  Administered 2023-06-09: 6 mg via INTRAVENOUS

## 2023-06-09 MED ORDER — FENTANYL CITRATE (PF) 100 MCG/2ML IJ SOLN
INTRAMUSCULAR | Status: AC
  Filled 2023-06-09: qty 2

## 2023-06-09 MED ORDER — FENTANYL CITRATE (PF) 100 MCG/2ML IJ SOLN: 25.0000 ug | INTRAMUSCULAR | Status: DC | PRN

## 2023-06-09 MED ORDER — PROPOFOL 10 MG/ML IV BOLUS
INTRAVENOUS | Status: DC | PRN
  Administered 2023-06-09 (×2): 40 mg via INTRAVENOUS
  Administered 2023-06-09: 80 mg via INTRAVENOUS
  Administered 2023-06-09: 40 mg via INTRAVENOUS

## 2023-06-09 MED ORDER — ROCURONIUM BROMIDE 10 MG/ML (PF) SYRINGE
PREFILLED_SYRINGE | INTRAVENOUS | Status: DC | PRN
  Administered 2023-06-09: 50 mg via INTRAVENOUS
  Administered 2023-06-09: 30 mg via INTRAVENOUS

## 2023-06-09 MED ORDER — CHLORHEXIDINE GLUCONATE 0.12 % MT SOLN: 15.0000 mL | Freq: Once | OROMUCOSAL | Status: AC

## 2023-06-09 MED ORDER — SUGAMMADEX SODIUM 200 MG/2ML IV SOLN
INTRAVENOUS | Status: DC | PRN
  Administered 2023-06-09: 200 mg via INTRAVENOUS

## 2023-06-09 MED ORDER — PHENYLEPHRINE 80 MCG/ML (10ML) SYRINGE FOR IV PUSH (FOR BLOOD PRESSURE SUPPORT)
PREFILLED_SYRINGE | INTRAVENOUS | Status: DC | PRN
  Administered 2023-06-09: 80 ug via INTRAVENOUS

## 2023-06-09 MED ORDER — FENTANYL CITRATE (PF) 250 MCG/5ML IJ SOLN
INTRAMUSCULAR | Status: DC | PRN
  Administered 2023-06-09 (×2): 50 ug via INTRAVENOUS

## 2023-06-09 MED ORDER — CHLORHEXIDINE GLUCONATE 0.12 % MT SOLN
OROMUCOSAL | Status: AC
  Administered 2023-06-09: 15 mL via OROMUCOSAL
  Filled 2023-06-09: qty 15

## 2023-06-09 MED ORDER — ONDANSETRON HCL 4 MG/2ML IJ SOLN
INTRAMUSCULAR | Status: DC | PRN
  Administered 2023-06-09: 4 mg via INTRAVENOUS

## 2023-06-09 MED ORDER — LACTATED RINGERS IV SOLN: INTRAVENOUS | Status: DC

## 2023-06-09 MED ORDER — LIDOCAINE 2% (20 MG/ML) 5 ML SYRINGE
INTRAMUSCULAR | Status: DC | PRN
  Administered 2023-06-09: 60 mg via INTRAVENOUS

## 2023-06-09 MED ORDER — PROPOFOL 1000 MG/100ML IV EMUL
INTRAVENOUS | Status: AC
  Filled 2023-06-09: qty 100

## 2023-06-09 SURGICAL SUPPLY — 1 items: fiducial IMPLANT
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
## 2023-06-09 NOTE — Anesthesia Procedure Notes (Signed)
Procedure Name: Intubation Date/Time: 06/09/2023 10:10 AM  Performed by: Kaylyn Layer, MDPre-anesthesia Checklist: Patient identified, Emergency Drugs available, Suction available and Patient being monitored Patient Re-evaluated:Patient Re-evaluated prior to induction Oxygen Delivery Method: Circle System Utilized Preoxygenation: Pre-oxygenation with 100% oxygen Induction Type: IV induction Ventilation: Mask ventilation without difficulty Laryngoscope Size: Glidescope and 3 Tube type: Oral Tube size: 8.5 mm Number of attempts: 1 Airway Equipment and Method: Video-laryngoscopy and Rigid stylet Placement Confirmation: ETT inserted through vocal cords under direct vision, positive ETCO2 and breath sounds checked- equal and bilateral Secured at: 23 cm Tube secured with: Tape Dental Injury: Teeth and Oropharynx as per pre-operative assessment  Difficulty Due To: Difficulty was anticipated, Difficult Airway- due to limited oral opening and Difficult Airway- due to anterior larynx Future Recommendations: Recommend- induction with short-acting agent, and alternative techniques readily available Comments: Glidescope #3 used for small mouth opening. Anterior view with Glidescope, somewhat difficult to angle stylet anterior to enough to reach larynx with 8.5 ETT.

## 2023-06-09 NOTE — Anesthesia Postprocedure Evaluation (Signed)
Anesthesia Post Note  Patient: NAMYA VOGES  Procedure(s) Performed: ROBOTIC ASSISTED NAVIGATIONAL BRONCHOSCOPY (Bilateral) VIDEO BRONCHOSCOPY WITH ENDOBRONCHIAL ULTRASOUND (Bilateral) BRONCHIAL BIOPSIES BRONCHIAL BRUSHINGS BRONCHIAL NEEDLE ASPIRATION BIOPSIES FIDUCIAL MARKER PLACEMENT     Patient location during evaluation: PACU Anesthesia Type: General Level of consciousness: awake and alert Pain management: pain level controlled Vital Signs Assessment: post-procedure vital signs reviewed and stable Respiratory status: spontaneous breathing, nonlabored ventilation and respiratory function stable Cardiovascular status: blood pressure returned to baseline Postop Assessment: no apparent nausea or vomiting Anesthetic complications: yes   Encounter Notable Events  Notable Event Outcome Phase Comment  Difficult to intubate - expected  Intraprocedure Filed from anesthesia note documentation.    Last Vitals:  Vitals:   06/09/23 1200 06/09/23 1215  BP: (!) 158/68 (!) 152/70  Pulse: 77 70  Resp: 20 19  Temp:  36.4 C  SpO2: 93% 93%    Last Pain:  Vitals:   06/09/23 1141  TempSrc:   PainSc: 0-No pain                 Shanda Howells

## 2023-06-09 NOTE — Op Note (Signed)
Video Bronchoscopy with Robotic Assisted Bronchoscopic Navigation   Date of Operation: 06/09/2023   Pre-op Diagnosis: Right lower lobe mass, left upper lobe nodule  Post-op Diagnosis: Same  Surgeon: Levy Pupa  Assistants: None  Anesthesia: General endotracheal anesthesia  Operation: Flexible video fiberoptic bronchoscopy with robotic assistance and biopsies.  Estimated Blood Loss: Minimal  Complications: None  Indications and History: Jenny White is a 77 y.o. female with history of tobacco use and COPD who was found on chest imaging to have a right lower lobe mass lesion, smaller left upper lobe pulmonary nodules 1 of which was cavitary and hypermetabolic on PET scan.  The right lower lobe lesion was hypermetabolic as well.  Recommendation made to achieve a tissue diagnosis via robotic assisted navigational bronchoscopy. The risks, benefits, complications, treatment options and expected outcomes were discussed with the patient.  The possibilities of pneumothorax, pneumonia, reaction to medication, pulmonary aspiration, perforation of a viscus, bleeding, failure to diagnose a condition and creating a complication requiring transfusion or operation were discussed with the patient who freely signed the consent.    Description of Procedure: The patient was seen in the Preoperative Area, was examined and was deemed appropriate to proceed.  The patient was taken to [facility] endoscopy room 3, identified as Jenny White and the procedure verified as Flexible Video Fiberoptic Bronchoscopy.  A Time Out was held and the above information confirmed.   Prior to the date of the procedure a high-resolution CT scan of the chest was performed. Utilizing ION software program a virtual tracheobronchial tree was generated to allow the creation of distinct navigation pathways to the patient's parenchymal abnormalities. After being taken to the operating room general anesthesia was initiated and the patient  was orally  intubated. The video fiberoptic bronchoscope was introduced via the endotracheal tube and a general inspection was performed which showed normal right and left lung anatomy. Aspiration of the bilateral mainstems was completed to remove any remaining secretions. Robotic catheter inserted into patient's endotracheal tube.   Target #1 right lower lobe mixed density mass: The distinct navigation pathways prepared prior to this procedure were then utilized to navigate to patient's lesion identified on CT scan. The robotic catheter was secured into place and the vision probe was withdrawn.  Lesion location was approximated using fluoroscopy.  Local registration and targeting was performed using Cios three-dimensional imaging. Under fluoroscopic guidance transbronchial needle brushings, transbronchial needle biopsies, and transbronchial forceps biopsies were performed to be sent for cytology and pathology.   Target #2 left upper lobe cavitary nodule: The distinct navigation pathways prepared prior to this procedure were then utilized to navigate to patient's lesion identified on CT scan. The robotic catheter was secured into place and the vision probe was withdrawn.  Lesion location was approximated using fluoroscopy. Local registration and targeting was performed using Cios three-dimensional imaging. Under fluoroscopic guidance transbronchial brushings, transbronchial needle biopsies, and transbronchial forceps biopsies were performed to be sent for cytology and pathology.  Under fluoroscopic guidance a single fiducial marker was placed adjacent to the nodule.  At the end of the procedure a general airway inspection was performed and there was no evidence of active bleeding. The bronchoscope was removed.  The patient tolerated the procedure well. There was no significant blood loss and there were no obvious complications. A post-procedural chest x-ray is pending.  Samples Target #1: 1. Transbronchial needle  brushings from right lower lobe mixed density mass 2. Transbronchial Wang needle biopsies from right lower lobe mass  3. Transbronchial forceps biopsies from right lower lobe mass  Samples Target #2: 1. Transbronchial brushings from left upper lobe cavitary nodule 2. Transbronchial Wang needle biopsies from left upper lobe nodule 3. Transbronchial forceps biopsies from left upper lobe nodule   Plans:  The patient will be discharged from the PACU to home when recovered from anesthesia and after chest x-ray is reviewed. We will review the cytology, pathology and microbiology results with the patient when they become available. Outpatient followup will be with Dr. Tonia Brooms or Dr. Delton Coombes.    Levy Pupa, MD, PhD 06/09/2023, 11:35 AM [facility] [phone] or if no answer before 7:00PM call [phone] For any issues after 7:00PM please call eLink [phone]

## 2023-06-09 NOTE — Research (Cosign Needed Addendum)
Title: A multi-center, prospective, single-arm, observational study to evaluate real-world outcomes for the shape-sensing Ion endoluminal system  Primary Outcome: Evaluate procedure characteristics and short and long-term patient outcomes following shape-sensing robotic-assisted bronchoscopy (ssRAB) utilizing the Ion Endoluminal System for lung lesion localization or biopsy.   Protocol # / Study Name: ISI-ION-003 Clinical Trials #: [id number] Sponsor: [employer] Principal Investigator: Dr. Elige Radon Icard  Key Features of Ion Endoluminal System (referred to as "Ion") Ion is the first FDA cleared bronchoscopy system that uses fiber optic shape sensing technology to inform on location within the airways. Its catheter/tool channel has a smaller outer diameter (3.5 mm) in comparison to conventional bronchoscopes, allowing it to navigate into the smaller airways of the periphery.     Key Inclusion Criteria Subject is 18 years or older at the time of the procedure Subject is a candidate for a planned, elective RAB lung lesion localization or biopsy procedure in which the Ion Endoluminal System is planned to be utilized.  Subject  able to understand and adhere to study requirements and provide informed consent.   Key Exclusion Criteria Subject is under the care of a Museum/gallery exhibitions officer and is unable to provide informed consent on their own accord.  Subject is participating in an interventional research study or research study with investigational agents with an unknown safety profile that would interfere with participation or the results of this study.  Female subjects who are pregnant or nursing at the time of the index Ion procedure, as determined by standard site practices. Subjects that are incarcerated or institutionalized under court order, or other vulnerable populations.    Previous Clinical Trials Since receiving FDA clearance in Feb 2019, Ion has been adopted  commercially by over 226 centers in the [place], and utilized in over 40,000 procedures.  The first in-human study enrolled 8 subjects with a mean lesion size of 14.8 mm and the overall diagnostic yield was 79.3%, with no adverse events. 17 (58.6%) lesions were reported to have a bronchus sign available on CT imaging.  A multi-center study published results in 2022, with 270 lesions biopsied in 241 patients using Ion. The mean largest cardinal lesion size was 18.86.59mm, and the mean airway generation count was 7.01.6. Asymptomatic pneumothorax occurred in 3.3% of subjects, and 0.8% experienced airway bleeding.   Another study provided preliminary results in 2022, with 87% sensitivity for malignancy, a diagnostic yield of 81%, and a mean lesion size of 16 mm. 75% of biopsy cases were bronchus-sign negative. 4% of subjects experienced pneumothoraces (including those requiring intervention), and 0.8% of subjects experienced airway bleeding requiring wedging or balloon tamponade.  A single-center study captured 131 consecutive procedures of pulmonary biopsy using Ion. The navigational success rate was 98.7%, with an overall diagnostic yield of 81.7%, an overall complication rate of 3%, and a pneumothorax rate of 1.5%.   [facility] @  Clinical Research Coordinator note:   This visit for Subject Jenny White with DOB: 1946/04/17 on 06/09/2023 for the above protocol is Visit/Encounter # Pre-procedure, Intra-procedure and Post-procedure, and is for purpose of research.   The consent for this encounter is under:  Protocol Version 1.0 Investigator Brochure Version N/A Consent Version Revision A, dated 14Nov2023 and is currently IRB approved.   Jenny White expressed continued interest and consent in continuing as a study subject. Subject confirmed that there was no change in contact information (e.g. address, telephone, email). Subject thanked for participation in research and contribution to science. In  this  visit 06/09/2023 the subject will be evaluated by Sub-Investigator named Dr. Delton Coombes. This research coordinator has verified that the above investigator is up to date with his/her training logs.   The Subject was informed that the PI continues to have oversight of the subject's visits and course through relevant discussions, reviews, and also specifically of this visit by routing of this note to the PI.  The research study was discussed with the subject in the pre-operative room. The study was explained in detail including all the contents of the informed consent document. The subject was encouraged to ask questions. All questions were answered to their satisfaction. The IRB approved informed consent was signed, and a copy was given to the subject. After obtaining consent, the subject underwent scheduled procedure using the ion endoluminal system. Data collection was completed per protocol. Refer to paper source subject binder for further details.      Signed by  Verdene Lennert Clinical Research Coordinator / Sub-Investigator  [facility]  [place], [place] 11:31 AM 06/09/2023

## 2023-06-09 NOTE — Discharge Instructions (Signed)
Flexible Bronchoscopy, Care After This sheet gives you information about how to care for yourself after your test. Your doctor may also give you more specific instructions. If you have problems or questions, contact your doctor. Follow these instructions at home: Eating and drinking When your numbness is gone and your cough and gag reflexes have come back, you may: Eat only soft foods. Slowly drink liquids. The day after the test, go back to your normal diet. Driving Do not drive for 24 hours if you were given a medicine to help you relax (sedative). Do not drive or use heavy machinery while taking prescription pain medicine. General instructions  Take over-the-counter and prescription medicines only as told by your doctor. Return to your normal activities as told. Ask what activities are safe for you. Do not use any products that have nicotine or tobacco in them. This includes cigarettes and e-cigarettes. If you need help quitting, ask your doctor. Keep all follow-up visits as told by your doctor. This is important. It is very important if you had a tissue sample (biopsy) taken. Get help right away if: You have shortness of breath that gets worse. You get light-headed. You feel like you are going to pass out (faint). You have chest pain. You cough up: More than a little blood. More blood than before. Summary Do not eat or drink anything (not even water) for 2 hours after your test, or until your numbing medicine wears off. Do not use cigarettes. Do not use e-cigarettes. Get help right away if you have chest pain.  Please call our office for any questions or concerns.  [phone].  This information is not intended to replace advice given to you by your health care provider. Make sure you discuss any questions you have with your health care provider. Document Released: 06/23/2009 Document Revised: 08/08/2017 Document Reviewed: 09/13/2016 Elsevier Patient Education  2020 Elsevier  Inc.  

## 2023-06-09 NOTE — Progress Notes (Signed)
Byrum MD reviewed pt chest x-ray. Pt okay to d/c home.

## 2023-06-09 NOTE — Transfer of Care (Signed)
Immediate Anesthesia Transfer of Care Note  Patient: Jenny White  Procedure(s) Performed: ROBOTIC ASSISTED NAVIGATIONAL BRONCHOSCOPY (Bilateral) VIDEO BRONCHOSCOPY WITH ENDOBRONCHIAL ULTRASOUND (Bilateral) BRONCHIAL BIOPSIES BRONCHIAL BRUSHINGS BRONCHIAL NEEDLE ASPIRATION BIOPSIES FIDUCIAL MARKER PLACEMENT  Patient Location: PACU  Anesthesia Type:General  Level of Consciousness: awake and alert   Airway & Oxygen Therapy: Patient Spontanous Breathing and Patient connected to face mask oxygen  Post-op Assessment: Report given to RN and Post -op Vital signs reviewed and stable  Post vital signs: Reviewed and stable  Last Vitals:  Vitals Value Taken Time  BP 163/83 06/09/23 1141  Temp    Pulse 92 06/09/23 1144  Resp 19 06/09/23 1144  SpO2 100 % 06/09/23 1144  Vitals shown include unfiled device data.  Last Pain:  Vitals:   06/09/23 0850  TempSrc:   PainSc: 7       Patients Stated Pain Goal: 0 (06/09/23 0850)  Complications:  Encounter Notable Events  Notable Event Outcome Phase Comment  Difficult to intubate - expected  Intraprocedure Filed from anesthesia note documentation.

## 2023-06-10 ENCOUNTER — Encounter (HOSPITAL_COMMUNITY): Payer: Self-pay | Admitting: Emergency Medicine

## 2023-06-12 LAB — CYTOLOGY - NON PAP

## 2023-06-13 ENCOUNTER — Ambulatory Visit (INDEPENDENT_AMBULATORY_CARE_PROVIDER_SITE_OTHER): Payer: Medicare Other | Admitting: Acute Care

## 2023-06-13 ENCOUNTER — Encounter: Payer: Self-pay | Admitting: Acute Care

## 2023-06-13 VITALS — BP 177/73 | HR 84 | Ht 66.0 in | Wt 129.6 lb

## 2023-06-13 DIAGNOSIS — C3490 Malignant neoplasm of unspecified part of unspecified bronchus or lung: Secondary | ICD-10-CM

## 2023-06-13 DIAGNOSIS — Z9889 Other specified postprocedural states: Secondary | ICD-10-CM

## 2023-06-13 DIAGNOSIS — R9389 Abnormal findings on diagnostic imaging of other specified body structures: Secondary | ICD-10-CM

## 2023-06-13 DIAGNOSIS — Z87891 Personal history of nicotine dependence: Secondary | ICD-10-CM

## 2023-06-13 NOTE — Patient Instructions (Addendum)
It is good to see you today. I am glad you have done well after the biopsy. 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 The left upper lobe nodules were atypical cells, which are not normal cells.  I have sent referral for Medical oncology, radiation oncology, and thoracic surgery to be evaluated for the best treatment options. I have ordered Pulmonary Function tests to be evaluated for surgery, You will get phone calls to get these appointments scheduled. I have also ordered an ultrasound of your thyroid, as it did show some uptake on the PET scan. You will get a call to get this scheduled.  Thyroid and Bilirubin done 9/19 at [facility] are within normal limits.  Call us if you need [place].  Please contact office for sooner follow up if symptoms do not improve or worsen or seek emergency care   3 month follow up for COPD with Dr. Tonia Brooms  or Maralyn Sago NP

## 2023-06-13 NOTE — Progress Notes (Signed)
History of Present Illness Jenny White is a 77 y.o. female former smoker ( Quit 2011 with a 52 pack year smoking history) with Gold 3 COPD,  seen by Dr. Tonia Brooms and bronch by Dr. Delton Coombes after abnormal lung cancer screening scan. ( Right lower lobe mass, left upper lobe nodule )  Synopsis 77 year old female, past medical history of COPD, gastroesophageal reflux, kidney stones, hypertension, sleep apnea.Patient had nuclear medicine pet imaging completed on 04/28/2023. 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 There is also a central left upper lobe cavitary nodule concerning for synchronous primary. Patient had no other evidence of hypermetabolic disease within the chest per PET imaging.  She underwent Flexible video fiberoptic bronchoscopy with robotic assistance and biopsies on 06/09/2023 per Dr. Delton Coombes. She is here today for post bronchoscopy evaluation and to review Cytology results.   06/13/2023 Pt. Presents for post bronchoscopy evaluation and to review Cytology results.  Patient is here today with her daughter-in-law.  She states she has done well after the procedure.  No hemoptysis, sore throat, fever, discolored secretions, or adverse reaction to anesthesia. We have discussed the cytology results from the biopsies.  We discussed that the right lower lobe was positive for adenocarcinoma.  We discussed that this is a non-small cell lung cancer.  We also discussed that the left upper lobe brushing and fine-needle aspiration were both positive for atypical cells. PET scan did not show distant metastatic disease.  There was notation of a documented SUV of the thyroid.  Most recent lab work done 919 through [facility] showed a normal thyroid function.  I will order an ultrasound of the thyroid as recommended just to ensure there are no concerns. Patient is a little bit overwhelmed with the diagnosis.  Her daughter-in-law  however is here to support her and encouraged her that she has good options for treatment. Plan will be to send to medical oncology, radiation oncology, as well as thoracic surgery to be evaluated.  Plan may involve a combination of any of the above. Patient is in agreement with meeting with all 3 specialties to determine best plan of care for her.  I have ordered PFTs for surgical evaluation.  Patient states she is doing well with her Trelegy for her COPD.  She is compliant with daily use.  She states it does cost her about $500 a month.  I have provided her with financial assistance paperwork through [employer] and also 2 samples to help with cost.  She is currently dealing with some chronic back pain.  She is being followed by orthopedics and plan is for cement injections in the near future.  Blood pressure was elevated today on admission.  Per patient's daughter-in-law patient has whitecoat syndrome and this is not an abnormal finding when she is being seen by physician.  Test Results: FINAL MICROSCOPIC DIAGNOSIS:  A. LUNG, RLL MASS, BRUSHING:  - Atypical cells present   B. LUNG, RLL MASS, FINE NEEDLE ASPIRATION  BIOPSY:  - Adenocarcinoma  - See comment   COMMENT:   Immunohistochemical stains show the tumor cells are positive for TTF-1  and negative for p40 supporting the diagnosis of adenocarcinoma.   FINAL MICROSCOPIC DIAGNOSIS:  C. LUNG, LUL, BRUSHING:  - Atypical cells present   D. LUNG, LUL, FINE NEEDLE ASPIRATION  BIOPSY:  - Atypical cells present   COMMENT:   Immunohistochemical stain for TTF-1 and p40 do not show definitive  evidence of malignancy.     Latest Ref Rng & Units 05/13/2023    1:21 PM 08/05/2022    5:54 PM 06/12/2022   10:00 AM  CBC  WBC 4.0 - 10.5 K/uL 9.1  9.5  10.3   Hemoglobin 12.0 - 15.0 g/dL 18.8  41.6  60.6   Hematocrit 36.0 - 46.0 % 42.4  44.6  45.3   Platelets 150 - 400 K/uL 345  410  350        Latest Ref Rng & Units 05/13/2023    1:21 PM  08/05/2022    5:54 PM 06/12/2022   10:00 AM  BMP  Glucose 70 - 99 mg/dL 93  301  97   BUN 8 - 23 mg/dL 16  14  11    Creatinine 0.44 - 1.00 mg/dL 6.01  0.93  2.35   Sodium 135 - 145 mmol/L 138  139  140   Potassium 3.5 - 5.1 mmol/L 3.5  3.8  3.7   Chloride 98 - 111 mmol/L 100  99  101   CO2 22 - 32 mmol/L 25  26  27    Calcium 8.9 - 10.3 mg/dL 57.3  22.0  25.4     BNP No results found for: "BNP"  ProBNP No results found for: "PROBNP"  PFT No results found for: "FEV1PRE", "FEV1POST", "FVCPRE", "FVCPOST", "TLC", "DLCOUNC", "PREFEV1FVCRT", "PSTFEV1FVCRT"  DG Chest Port 1 View  Result Date: 06/09/2023 CLINICAL DATA:  Status post bronchoscopy with biopsy. EXAM: PORTABLE CHEST 1 VIEW COMPARISON:  September 04, 2008. FINDINGS: The heart size and mediastinal contours are within normal limits. No pneumothorax or pleural effusion is noted. Hyperexpansion of the lungs is noted. Both lungs are clear. The visualized skeletal structures are unremarkable. IMPRESSION: Hyperexpansion of the lungs.  No pneumothorax. Electronically Signed   By: Lupita Raider M.D.   On: 06/09/2023 14:00   DG C-ARM BRONCHOSCOPY  Result Date: 06/09/2023 C-ARM BRONCHOSCOPY: Fluoroscopy was utilized by the requesting physician.  No radiographic interpretation.     Past medical hx Past Medical History:  Diagnosis Date   Arthritis    in back   COPD (chronic obstructive pulmonary disease) (HCC)    Difficult airway    Due to limited oral opening. Elective glidescope used previously.   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   in [facility]   Sleep apnea    no CPAP Can't tolerate     Social History   Tobacco Use   Smoking status: Former    Current packs/day: 0.00    Average packs/day: 1 pack/day for 52.0 years (52.0 ttl pk-yrs)    Types: Cigarettes    Start date:  33    Quit date: 2011    Years since quitting: 13.7   Smokeless tobacco: Never   Tobacco comments:    uses nicorette gum  Vaping Use   Vaping status: Never Used  Substance Use Topics   Alcohol use: Not Currently   Drug use: Never    Ms.Berkey reports that she quit smoking about 13 years ago. Her smoking use included cigarettes. She started smoking about 65 years ago. She has a 52 pack-year smoking history. She has never used smokeless tobacco. She reports that she does not currently use alcohol. She reports that she does not use drugs.  Tobacco Cessation: Patient  is a former smoker.  She quit smoking in 2011 however she does continue to use nicotine gum.  Past surgical hx, Family hx, Social hx all reviewed.  Current Outpatient Medications on File Prior to Visit  Medication Sig   acetaminophen (TYLENOL) 500 MG tablet Take 1,000 mg by mouth 2 (two) times daily as needed for moderate pain.   albuterol (VENTOLIN HFA) 108 (90 Base) MCG/ACT inhaler Inhale 2 puffs into the lungs every 4 (four) hours as needed for wheezing or shortness of breath.   aspirin EC 81 MG tablet Take 81 mg by mouth every evening. Swallow whole.   cholecalciferol (VITAMIN D3) 25 MCG (1000 UNIT) tablet Take 1,000 Units by mouth every evening.   docusate sodium (COLACE) 100 MG capsule Take 1 capsule (100 mg total) by mouth 2 (two) times daily.   Fluticasone-Umeclidin-Vilant (TRELEGY ELLIPTA) 100-62.5-25 MCG/INH AEPB Inhale 1 puff into the lungs daily.   gabapentin (NEURONTIN) 100 MG capsule Take 100-300 mg by mouth See admin instructions. Take 100 mg by mouth in the morning and 300 mg by mouth at bedtime   HYDROcodone-acetaminophen (NORCO/VICODIN) 5-325 MG tablet Take 0.5 tablets by mouth in the morning, at noon, in the evening, and at bedtime.   ibuprofen (ADVIL) 200 MG tablet Take 800 mg by mouth in the morning, at noon, in the evening, and at bedtime.   lisinopril-hydrochlorothiazide (PRINZIDE,ZESTORETIC) 10-12.5 MG  tablet Take 1 tablet by mouth in the morning.   loratadine (CLARITIN) 10 MG tablet Take 10 mg by mouth every evening.   magnesium oxide (MAG-OX) 400 (240 Mg) MG tablet Take 400 mg by mouth at bedtime.   montelukast (SINGULAIR) 10 MG tablet Take 10 mg by mouth at bedtime.   nicotine polacrilex (NICORETTE) 2 MG gum Take 2 mg by mouth as needed for smoking cessation.   pantoprazole (PROTONIX) 20 MG tablet Take 20 mg by mouth 2 (two) times daily.   alendronate (FOSAMAX) 70 MG tablet TAKE 1 TABLET BY MOUTH ONCE A WEEK ON AN EMPTY STOMACH WITH A FULL GLASS OF WATER. AVOID MINERAL OR WELL WATER. DO NOT EAT OR TAKE OTHER MEDICATIONS FOR AT LEAST 30 MINUTES AFTER DOSE. SIT OR STAND FOR AT LEAST 30 MINUTES AFTER DOSE (Patient not taking: Reported on 06/13/2023)   No current facility-administered medications on file prior to visit.     Allergies  Allergen Reactions   Demerol Hcl [Meperidine] Nausea And Vomiting, Nausea Only and Other (See Comments)   Percocet [Oxycodone-Acetaminophen] Nausea And Vomiting    Review Of Systems:  Constitutional:   No  weight loss, night sweats,  Fevers, chills, fatigue, or  lassitude.  HEENT:   No headaches,  Difficulty swallowing,  Tooth/dental problems, or  Sore throat,                No sneezing, itching, ear ache, nasal congestion, post nasal drip,   CV:  No chest pain,  Orthopnea, PND, swelling in lower extremities, anasarca, dizziness, palpitations, syncope.   GI  No heartburn, indigestion, abdominal pain, nausea, vomiting, diarrhea, change in bowel habits, loss of appetite, bloody stools.   Resp: + shortness of breath with exertion less at rest.  No excess mucus, no productive cough,  No non-productive cough,  No coughing up of blood.  No change in color of mucus.  No wheezing.  No chest wall deformity  Skin: no rash or lesions.  GU: no dysuria, change in color of urine, no urgency or frequency.  No flank pain, no hematuria  MS:  No joint pain or swelling.   No decreased range of motion.  + back pain.  Psych:  No change in mood or affect. No depression or anxiety.  No memory loss.   Vital Signs BP (!) 177/73 (BP Location: Left Arm, Patient Position: Sitting, Cuff Size: Normal)   Pulse 84   Ht 5\' 6"  (1.676 m)   Wt 129 lb 9.6 oz (58.8 kg)   SpO2 96%   BMI 20.92 kg/m    Physical Exam:  General- No distress,  A&Ox3, pleasant ENT: No sinus tenderness, TM clear, pale nasal mucosa, no oral exudate,no post nasal drip, no LAN Cardiac: S1, S2, regular rate and rhythm, no murmur Chest: No wheeze/ rales/ dullness; no accessory muscle use, no nasal flaring, no sternal retractions, slightly diminished per bases Abd.: Soft Non-tender, ND, bowel sounds positive,Body mass index is 20.92 kg/m.  Ext: No clubbing cyanosis, edema Neuro:  normal strength, moving all extremities x 4, alert and oriented x 3, appropriate Skin: No rashes, warm and dry, no lesions Psych: normal mood and behavior, appropriate   Assessment/Plan New diagnosis adenocarcinoma of the right lower lobe Atypical cells in the left upper lobe Former smoker Post bronchoscopy with biopsies Thyroid with positive SUV avidity on PET scan Abnormal imaging of the thyroid Plan I am glad you have done well after the biopsy. 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 The left upper lobe nodules were atypical cells, which are not normal cells.  I have sent referral for Medical oncology, radiation oncology, and thoracic surgery to be evaluated for the best treatment options. I have ordered Pulmonary Function tests to be evaluated for surgery, You will get phone calls to get these appointments scheduled. I have also ordered an ultrasound of your thyroid, as it did show some uptake on the PET scan. You will get a call to get this scheduled.  Thyroid and Bilirubin done 9/19 at [facility] are within normal limits.  Call us if you need [place].   Please contact office for sooner follow up if symptoms do not improve or worsen or seek emergency care   3 month follow up for COPD with Dr. Tonia Brooms  or Maralyn Sago NP  I spent 45 minutes dedicated to the care of this patient on the date of this encounter to include pre-visit review of records, face-to-face time with the patient discussing conditions above, post visit ordering of testing, clinical documentation with the electronic health record, making appropriate referrals as documented, and communicating necessary information to the patient's healthcare team.   Bevelyn Ngo, NP 06/13/2023  9:45 AM

## 2023-06-16 ENCOUNTER — Ambulatory Visit (HOSPITAL_BASED_OUTPATIENT_CLINIC_OR_DEPARTMENT_OTHER): Payer: Medicare Other

## 2023-06-16 DIAGNOSIS — C3490 Malignant neoplasm of unspecified part of unspecified bronchus or lung: Secondary | ICD-10-CM | POA: Diagnosis not present

## 2023-06-16 DIAGNOSIS — C3431 Malignant neoplasm of lower lobe, right bronchus or lung: Secondary | ICD-10-CM | POA: Insufficient documentation

## 2023-06-16 DIAGNOSIS — C3412 Malignant neoplasm of upper lobe, left bronchus or lung: Secondary | ICD-10-CM | POA: Insufficient documentation

## 2023-06-16 LAB — PULMONARY FUNCTION TEST
DL/VA % pred: 87 %
DL/VA: 3.53 ml/min/mmHg/L
DLCO cor % pred: 66 %
DLCO cor: 13.04 ml/min/mmHg
DLCO unc % pred: 66 %
DLCO unc: 13.08 ml/min/mmHg
FEF 25-75 Post: 0.28 L/s
FEF 25-75 Pre: 0.25 L/s
FEF2575-%Change-Post: 12 %
FEF2575-%Pred-Post: 17 %
FEF2575-%Pred-Pre: 15 %
FEV1-%Change-Post: 6 %
FEV1-%Pred-Post: 35 %
FEV1-%Pred-Pre: 32 %
FEV1-Post: 0.74 L
FEV1-Pre: 0.69 L
FEV1FVC-%Change-Post: 7 %
FEV1FVC-%Pred-Pre: 52 %
FEV6-%Change-Post: 4 %
FEV6-%Pred-Post: 61 %
FEV6-%Pred-Pre: 58 %
FEV6-Post: 1.63 L
FEV6-Pre: 1.56 L
FEV6FVC-%Change-Post: 5 %
FEV6FVC-%Pred-Post: 98 %
FEV6FVC-%Pred-Pre: 93 %
FVC-%Change-Post: 0 %
FVC-%Pred-Post: 61 %
FVC-%Pred-Pre: 62 %
FVC-Post: 1.74 L
FVC-Pre: 1.76 L
Post FEV1/FVC ratio: 42 %
Post FEV6/FVC ratio: 94 %
Pre FEV1/FVC ratio: 39 %
Pre FEV6/FVC Ratio: 89 %
RV % pred: 192 %
RV: 4.61 L
TLC % pred: 130 %
TLC: 6.8 L

## 2023-06-16 NOTE — Progress Notes (Signed)
Thoracic Location of Tumor / Histology: RLL and LUL  Patient has been followed in the lung cancer screening clinic since 2020.  He was noted to have a concerning nodule in the right lower lobe.  Bronchoscopy 06/09/2023:   PET 04/28/2023: The mixed attenuation right lower lobe lung mass is hypermetabolic in its solid components, most consistent with adenocarcinoma.  The central left upper lobe solid and cavitary nodule is hypermetabolic, favoring synchronous primary bronchogenic carcinoma.  No evidence of hypermetabolic thoracic nodal or distant metastasis.  CT Chest 03/31/2023: large poorly marginated partial solid posterior right lower lobe mass measuring 5.3 cm with a new thick-walled cavity and a 1 cm central left upper lobe nodule with numerous bilateral pulmonary nodules and no pathologically enlarged axillary mediastinal or hilar nodes.    Biopsies of RLL and RUL Lungs 06/09/2023     Past/Anticipated interventions by cardiothoracic surgery, if any:  Dr. Dorris Fetch 06/18/2023   Past/Anticipated interventions by medical oncology, if any:  Cassie Heilingoetter / Dr. Arbutus Ped 06/19/2023   Tobacco/Marijuana/Snuff/ETOH use: Former smoker  Signs/Symptoms Weight changes, if any: She reports about 15 pound weight loss over the past 6 months. Respiratory complaints, if any: She reports increased SOB with activities, notes a little at rest. Hemoptysis, if any: Denies. Pain issues, if any:  Lower back,upper left leg- from spinal fusion and cracked vertebrae. She is seeing neurology, will be evaluated for surgery.   SAFETY ISSUES: Prior radiation? No Pacemaker/ICD? No  Possible current pregnancy? Postmenopausal Is the patient on methotrexate? No  Current Complaints / other details:   PFT 06/16/2023

## 2023-06-16 NOTE — Progress Notes (Signed)
Full PFT Performed Today  

## 2023-06-16 NOTE — Patient Instructions (Signed)
Full PFT Performed Today  
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
## 2023-06-17 ENCOUNTER — Ambulatory Visit
Admission: RE | Admit: 2023-06-17 | Discharge: 2023-06-17 | Disposition: A | Discharge status: Home / Self Care | Payer: Medicare Other | Source: Ambulatory Visit | Attending: Radiation Oncology | Admitting: Radiation Oncology

## 2023-06-17 ENCOUNTER — Encounter: Payer: Self-pay | Admitting: Radiation Oncology

## 2023-06-17 VITALS — BP 156/81 | HR 83 | Temp 97.1°F | Resp 18 | Ht 65.0 in | Wt 129.1 lb

## 2023-06-17 DIAGNOSIS — Z87442 Personal history of urinary calculi: Secondary | ICD-10-CM | POA: Insufficient documentation

## 2023-06-17 DIAGNOSIS — M129 Arthropathy, unspecified: Secondary | ICD-10-CM | POA: Diagnosis not present

## 2023-06-17 DIAGNOSIS — C3431 Malignant neoplasm of lower lobe, right bronchus or lung: Secondary | ICD-10-CM | POA: Insufficient documentation

## 2023-06-17 DIAGNOSIS — Z79899 Other long term (current) drug therapy: Secondary | ICD-10-CM | POA: Diagnosis not present

## 2023-06-17 DIAGNOSIS — I10 Essential (primary) hypertension: Secondary | ICD-10-CM | POA: Diagnosis not present

## 2023-06-17 DIAGNOSIS — Z7982 Long term (current) use of aspirin: Secondary | ICD-10-CM | POA: Diagnosis not present

## 2023-06-17 DIAGNOSIS — J449 Chronic obstructive pulmonary disease, unspecified: Secondary | ICD-10-CM | POA: Insufficient documentation

## 2023-06-17 DIAGNOSIS — G4730 Sleep apnea, unspecified: Secondary | ICD-10-CM | POA: Diagnosis not present

## 2023-06-17 DIAGNOSIS — Z87891 Personal history of nicotine dependence: Secondary | ICD-10-CM | POA: Diagnosis not present

## 2023-06-17 DIAGNOSIS — J431 Panlobular emphysema: Secondary | ICD-10-CM | POA: Diagnosis not present

## 2023-06-17 DIAGNOSIS — Z803 Family history of malignant neoplasm of breast: Secondary | ICD-10-CM | POA: Diagnosis not present

## 2023-06-17 DIAGNOSIS — K219 Gastro-esophageal reflux disease without esophagitis: Secondary | ICD-10-CM | POA: Diagnosis not present

## 2023-06-17 DIAGNOSIS — C3412 Malignant neoplasm of upper lobe, left bronchus or lung: Secondary | ICD-10-CM

## 2023-06-17 DIAGNOSIS — R918 Other nonspecific abnormal finding of lung field: Secondary | ICD-10-CM

## 2023-06-17 MED ORDER — LORAZEPAM 0.5 MG PO TABS
ORAL_TABLET | ORAL | 0 refills | Status: DC
Start: 2023-06-17 — End: 2023-12-11

## 2023-06-17 MED ORDER — LORAZEPAM 0.5 MG PO TABS
ORAL_TABLET | ORAL | 0 refills | Status: DC
Start: 2023-06-17 — End: 2023-06-17
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
## 2023-06-18 ENCOUNTER — Institutional Professional Consult (permissible substitution) (INDEPENDENT_AMBULATORY_CARE_PROVIDER_SITE_OTHER): Payer: Medicare Other | Admitting: Thoracic Surgery (Cardiothoracic Vascular Surgery)

## 2023-06-18 ENCOUNTER — Other Ambulatory Visit: Payer: Self-pay

## 2023-06-18 ENCOUNTER — Other Ambulatory Visit: Payer: Self-pay | Admitting: Physician Assistant

## 2023-06-18 VITALS — BP 147/75 | HR 99 | Resp 20 | Ht 65.0 in | Wt 129.0 lb

## 2023-06-18 DIAGNOSIS — C3431 Malignant neoplasm of lower lobe, right bronchus or lung: Secondary | ICD-10-CM

## 2023-06-18 DIAGNOSIS — R918 Other nonspecific abnormal finding of lung field: Secondary | ICD-10-CM | POA: Diagnosis not present
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
## 2023-06-19 ENCOUNTER — Ambulatory Visit
Admission: RE | Admit: 2023-06-19 | Discharge: 2023-06-19 | Disposition: A | Discharge status: Home / Self Care | Payer: Medicare Other | Source: Ambulatory Visit | Attending: Radiation Oncology | Admitting: Radiation Oncology

## 2023-06-19 ENCOUNTER — Ambulatory Visit: Payer: Medicare Other

## 2023-06-19 ENCOUNTER — Telehealth: Payer: Self-pay | Admitting: Radiation Oncology

## 2023-06-19 ENCOUNTER — Other Ambulatory Visit: Payer: Self-pay

## 2023-06-19 ENCOUNTER — Inpatient Hospital Stay: Payer: Medicare Other | Admitting: Physician Assistant

## 2023-06-19 ENCOUNTER — Inpatient Hospital Stay: Payer: Medicare Other | Attending: Physician Assistant

## 2023-06-19 VITALS — BP 167/71 | HR 77 | Temp 98.4°F | Resp 18 | Wt 129.2 lb

## 2023-06-19 DIAGNOSIS — R0609 Other forms of dyspnea: Secondary | ICD-10-CM | POA: Diagnosis not present

## 2023-06-19 DIAGNOSIS — K5900 Constipation, unspecified: Secondary | ICD-10-CM | POA: Diagnosis not present

## 2023-06-19 DIAGNOSIS — Z87891 Personal history of nicotine dependence: Secondary | ICD-10-CM

## 2023-06-19 DIAGNOSIS — C3412 Malignant neoplasm of upper lobe, left bronchus or lung: Secondary | ICD-10-CM | POA: Insufficient documentation

## 2023-06-19 DIAGNOSIS — Z803 Family history of malignant neoplasm of breast: Secondary | ICD-10-CM | POA: Diagnosis not present

## 2023-06-19 DIAGNOSIS — J449 Chronic obstructive pulmonary disease, unspecified: Secondary | ICD-10-CM | POA: Insufficient documentation

## 2023-06-19 DIAGNOSIS — Z801 Family history of malignant neoplasm of trachea, bronchus and lung: Secondary | ICD-10-CM | POA: Insufficient documentation

## 2023-06-19 DIAGNOSIS — Z79899 Other long term (current) drug therapy: Secondary | ICD-10-CM | POA: Insufficient documentation

## 2023-06-19 DIAGNOSIS — C3431 Malignant neoplasm of lower lobe, right bronchus or lung: Secondary | ICD-10-CM | POA: Diagnosis not present

## 2023-06-19 LAB — CBC WITH DIFFERENTIAL (CANCER CENTER ONLY)
Abs Immature Granulocytes: 0.02 10*3/uL (ref 0.00–0.07)
Basophils Absolute: 0.1 10*3/uL (ref 0.0–0.1)
Basophils Relative: 1 %
Eosinophils Absolute: 0.4 10*3/uL (ref 0.0–0.5)
Eosinophils Relative: 4 %
HCT: 43.1 % (ref 36.0–46.0)
Hemoglobin: 13.8 g/dL (ref 12.0–15.0)
Immature Granulocytes: 0 %
Lymphocytes Relative: 30 %
Lymphs Abs: 2.8 10*3/uL (ref 0.7–4.0)
MCH: 30.3 pg (ref 26.0–34.0)
MCHC: 32 g/dL (ref 30.0–36.0)
MCV: 94.7 fL (ref 80.0–100.0)
Monocytes Absolute: 1.4 10*3/uL — ABNORMAL HIGH (ref 0.1–1.0)
Monocytes Relative: 15 %
Neutro Abs: 4.6 10*3/uL (ref 1.7–7.7)
Neutrophils Relative %: 50 %
Platelet Count: 365 10*3/uL (ref 150–400)
RBC: 4.55 MIL/uL (ref 3.87–5.11)
RDW: 15.6 % — ABNORMAL HIGH (ref 11.5–15.5)
WBC Count: 9.2 10*3/uL (ref 4.0–10.5)
nRBC: 0 % (ref 0.0–0.2)

## 2023-06-19 LAB — CMP (CANCER CENTER ONLY)
ALT: 9 U/L (ref 0–44)
AST: 15 U/L (ref 15–41)
Albumin: 4.4 g/dL (ref 3.5–5.0)
Alkaline Phosphatase: 52 U/L (ref 38–126)
Anion gap: 7 (ref 5–15)
BUN: 19 mg/dL (ref 8–23)
CO2: 31 mmol/L (ref 22–32)
Calcium: 10.2 mg/dL (ref 8.9–10.3)
Chloride: 101 mmol/L (ref 98–111)
Creatinine: 0.81 mg/dL (ref 0.44–1.00)
GFR, Estimated: >60 mL/min (ref >60)
Glucose, Bld: 91 mg/dL (ref 70–99)
Potassium: 3.6 mmol/L (ref 3.5–5.1)
Sodium: 139 mmol/L (ref 135–145)
Total Bilirubin: 0.5 mg/dL (ref 0.3–1.2)
Total Protein: 7.5 g/dL (ref 6.5–8.1)

## 2023-06-19 NOTE — Telephone Encounter (Signed)
I called and spoke with the patient was not felt to be a good surgical candidate after meeting with Dr. Dorris Fetch based on PFTs. Her case was discussed this morning in thoracic oncology conference and she appears to be a good candidate still for SBRT like we discussed to the LUL and RLL. She will come today to establish with Dr. Arbutus Ped and was offered an appointment since transportation is difficult to navigate this week for simulation this afternoon following her appointment with Dr. Arbutus Ped. She will sign consent to proceed when she comes today.

## 2023-06-19 NOTE — Progress Notes (Signed)
I met with pt today at her med onc consult with Dr.mohamed. She is accompanied by her daughter in law, Jenny White. Plan for the pt is radiation to the nodules in her LUL and RLL. Pt is already established with Radiation Oncology and sees them today for her CT simulation. Pt understands she is to have a scan and f/u appt with Dr Arbutus Ped or C.Heilingoetter, Onc PA in 4 months. I provided my business card to the pt in the event she has any questions or concerns. No barriers noted at this time.

## 2023-06-20 NOTE — Progress Notes (Signed)
The proposed treatment discussed in conference is for discussion purpose only and is not a binding recommendation.  The patients have not been physically examined, or presented with their treatment options.  Therefore, final treatment plans cannot be decided.  

## 2023-06-22 ENCOUNTER — Ambulatory Visit
Admission: RE | Admit: 2023-06-22 | Discharge: 2023-06-22 | Disposition: A | Discharge status: Home / Self Care | Payer: Medicare Other | Source: Ambulatory Visit | Attending: Radiation Oncology | Admitting: Radiation Oncology

## 2023-06-22 DIAGNOSIS — C3431 Malignant neoplasm of lower lobe, right bronchus or lung: Secondary | ICD-10-CM | POA: Insufficient documentation

## 2023-06-22 DIAGNOSIS — C3412 Malignant neoplasm of upper lobe, left bronchus or lung: Secondary | ICD-10-CM | POA: Insufficient documentation

## 2023-06-22 MED ORDER — GADOBUTROL 1 MMOL/ML IV SOLN
5.0000 mL | Freq: Once | INTRAVENOUS | Status: AC | PRN
  Administered 2023-06-22: 5 mL via INTRAVENOUS

## 2023-06-23 ENCOUNTER — Ambulatory Visit
Admission: RE | Admit: 2023-06-23 | Discharge: 2023-06-23 | Disposition: A | Discharge status: Home / Self Care | Payer: Medicare Other | Source: Ambulatory Visit | Attending: Acute Care | Admitting: Acute Care

## 2023-06-23 ENCOUNTER — Telehealth: Payer: Self-pay | Admitting: Acute Care

## 2023-06-23 DIAGNOSIS — C3490 Malignant neoplasm of unspecified part of unspecified bronchus or lung: Secondary | ICD-10-CM | POA: Insufficient documentation

## 2023-06-23 NOTE — Telephone Encounter (Signed)
Returned call to patient to schedule appointment for review of results.  Patient states she 'just wants an phone call, can't she just call me?"  Explained that a video visit was requested so provider could review results in detail. Patient declined appointment.  Advised we will not have results for a few days and will notify her after Kandice Robinsons, NP has reviewed the [place] and then will advise patient of what is needed.

## 2023-06-23 NOTE — Telephone Encounter (Signed)
Patient needs Mychart video visit after her US thyroid. Patient can be scheduled with Kandice Robinsons NP 10/23 morning as video visit. Called and left VM.

## 2023-06-24 ENCOUNTER — Other Ambulatory Visit: Payer: Self-pay | Admitting: Neurosurgery

## 2023-06-25 ENCOUNTER — Other Ambulatory Visit: Payer: Medicare Other

## 2023-06-26 ENCOUNTER — Encounter (HOSPITAL_COMMUNITY): Payer: Self-pay | Admitting: Neurosurgery

## 2023-06-26 ENCOUNTER — Other Ambulatory Visit: Payer: Self-pay

## 2023-06-26 ENCOUNTER — Telehealth: Payer: Self-pay | Admitting: Radiation Oncology

## 2023-06-26 NOTE — Progress Notes (Signed)
PCP - Danella Penton, MD  Cardiologist -   PPM/ICD - denies Device Orders - n/a Rep Notified - n/a  Chest x-ray - 06-09-23 EKG - 06-12-22 Stress Test - per patient 10+ years ago ECHO - per patient 10+years ago Cardiac Cath - denies  CPAP - Yes but have not worn it for a while per patient  DM - denies  Blood Thinner Instructions: denies Aspirin Instructions: Last Dose 06-25-23  ERAS Protcol - clear liquids until  COVID TEST- no  Anesthesia review: Yes hx of COPD, HTN, OSA, Lung CA  Patient verbally denies any shortness of breath, fever, cough and chest pain during phone call   -------------  SDW INSTRUCTIONS given:  Your procedure is scheduled on June 30, 2023 .  Report to [facility] Main Entrance "A" at 1235 P.M., and check in at the Admitting office.  Call this number if you have problems the morning of surgery:  [phone]   Remember:  Do not eat after midnight the night before your surgery  You may drink clear liquids until 12:05 the morning of your surgery.   Clear liquids allowed are: Water, Non-Citrus Juices (without pulp), Carbonated Beverages, Clear Tea, Black Coffee Only, and Gatorade    Take these medicines the morning of surgery with A SIP OF WATER   (TRELEGY ELLIPTA)  gabapentin (NEURONTIN)  pantoprazole (PROTONIX)  HYDROcodone-acetaminophen (NORCO/VICODIN)   IF NEEDED acetaminophen (TYLENOL)  albuterol (VENTOLIN HFA)   As of today, STOP taking any Aspirin (unless otherwise instructed by your surgeon) Aleve, Naproxen, Ibuprofen, Motrin, Advil, Goody's, BC's, all herbal medications, fish oil, and all vitamins.                      Do not wear jewelry, make up, or nail polish            Do not wear lotions, powders, perfumes/colognes, or deodorant.            Do not shave 48 hours prior to surgery.  Men may shave face and neck.            Do not bring valuables to the hospital.            [facility] is not responsible for any belongings or  valuables.  Do NOT Smoke (Tobacco/Vaping) 24 hours prior to your procedure If you use a CPAP at night, you may bring all equipment for your overnight stay.   Contacts, glasses, dentures or bridgework may not be worn into surgery.      For patients admitted to the hospital, discharge time will be determined by your treatment team.   Patients discharged the day of surgery will not be allowed to drive home, and someone needs to stay with them for 24 hours.    Special instructions:   [place]- Preparing For Surgery  Before surgery, you can play an important role. Because skin is not sterile, your skin needs to be as free of germs as possible. You can reduce the number of germs on your skin by washing with CHG (chlorahexidine gluconate) Soap before surgery.  CHG is an antiseptic cleaner which kills germs and bonds with the skin to continue killing germs even after washing.    Oral Hygiene is also important to reduce your risk of infection.  Remember - BRUSH YOUR TEETH THE MORNING OF SURGERY WITH YOUR REGULAR TOOTHPASTE  Please do not use if you have an allergy to CHG or antibacterial soaps. If your  skin becomes reddened/irritated stop using the CHG.  Do not shave (including legs and underarms) for at least 48 hours prior to first CHG shower. It is OK to shave your face.  Please follow these instructions carefully.   Shower the NIGHT BEFORE SURGERY and the MORNING OF SURGERY with DIAL Soap.   Pat yourself dry with a CLEAN TOWEL.  Wear CLEAN PAJAMAS to bed the night before surgery  Place CLEAN SHEETS on your bed the night of your first shower and DO NOT SLEEP WITH PETS.   Day of Surgery: Please shower morning of surgery  Wear Clean/Comfortable clothing the morning of surgery Do not apply any deodorants/lotions.   Remember to brush your teeth WITH YOUR REGULAR TOOTHPASTE.   Questions were answered. Patient verbalized understanding of instructions.

## 2023-06-26 NOTE — Telephone Encounter (Signed)
I called and let the patient know her results from her brain MRI. She will otherwise proceed with SBRT treatment beginning on 07/08/23 as previously discussed.

## 2023-06-27 ENCOUNTER — Encounter (HOSPITAL_COMMUNITY): Payer: Self-pay

## 2023-06-27 NOTE — Telephone Encounter (Signed)
Thyroid US results have come in. Will forward to Maralyn Sago to advise. Will be able to address next week.
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
## 2023-06-29 NOTE — Plan of Care (Signed)
CHL Tonsillectomy/Adenoidectomy, Postoperative PEDS care plan entered in error.

## 2023-06-30 ENCOUNTER — Encounter (HOSPITAL_COMMUNITY): Payer: Self-pay

## 2023-06-30 ENCOUNTER — Ambulatory Visit (HOSPITAL_COMMUNITY): Payer: Medicare Other

## 2023-06-30 ENCOUNTER — Ambulatory Visit (HOSPITAL_COMMUNITY): Payer: Medicare Other | Admitting: Physician Assistant

## 2023-06-30 ENCOUNTER — Ambulatory Visit (HOSPITAL_BASED_OUTPATIENT_CLINIC_OR_DEPARTMENT_OTHER): Payer: Medicare Other | Admitting: Physician Assistant

## 2023-06-30 ENCOUNTER — Ambulatory Visit (HOSPITAL_COMMUNITY)
Admission: RE | Admit: 2023-06-30 | Discharge: 2023-06-30 | Disposition: A | Discharge status: Home / Self Care | Payer: Medicare Other | Attending: Neurosurgery | Admitting: Neurosurgery

## 2023-06-30 ENCOUNTER — Other Ambulatory Visit: Payer: Self-pay

## 2023-06-30 ENCOUNTER — Encounter (HOSPITAL_COMMUNITY): Payer: Self-pay | Admitting: Neurosurgery

## 2023-06-30 ENCOUNTER — Encounter (HOSPITAL_COMMUNITY)
Admission: RE | Disposition: A | Discharge status: Home / Self Care | Payer: Self-pay | Source: Home / Self Care | Attending: Neurosurgery

## 2023-06-30 DIAGNOSIS — M8008XA Age-related osteoporosis with current pathological fracture, vertebra(e), initial encounter for fracture: Secondary | ICD-10-CM

## 2023-06-30 DIAGNOSIS — K219 Gastro-esophageal reflux disease without esophagitis: Secondary | ICD-10-CM | POA: Insufficient documentation

## 2023-06-30 DIAGNOSIS — Z7951 Long term (current) use of inhaled steroids: Secondary | ICD-10-CM | POA: Insufficient documentation

## 2023-06-30 DIAGNOSIS — I10 Essential (primary) hypertension: Secondary | ICD-10-CM

## 2023-06-30 DIAGNOSIS — G4730 Sleep apnea, unspecified: Secondary | ICD-10-CM | POA: Insufficient documentation

## 2023-06-30 DIAGNOSIS — Z87891 Personal history of nicotine dependence: Secondary | ICD-10-CM | POA: Insufficient documentation

## 2023-06-30 DIAGNOSIS — J431 Panlobular emphysema: Secondary | ICD-10-CM | POA: Diagnosis not present

## 2023-06-30 DIAGNOSIS — Z85118 Personal history of other malignant neoplasm of bronchus and lung: Secondary | ICD-10-CM | POA: Diagnosis not present

## 2023-06-30 HISTORY — PX: KYPHOPLASTY: SHX5884

## 2023-06-30 LAB — SURGICAL PCR SCREEN
MRSA, PCR: NEGATIVE
Staphylococcus aureus: NEGATIVE

## 2023-06-30 SURGERY — KYPHOPLASTY
Anesthesia: General | Site: Spine Lumbar

## 2023-06-30 MED ORDER — CEFAZOLIN SODIUM-DEXTROSE 2-4 GM/100ML-% IV SOLN
INTRAVENOUS | Status: AC
  Filled 2023-06-30: qty 100

## 2023-06-30 MED ORDER — PROPOFOL 10 MG/ML IV BOLUS
INTRAVENOUS | Status: DC | PRN
  Administered 2023-06-30: 125 ug/kg/min via INTRAVENOUS
  Administered 2023-06-30: 120 mg via INTRAVENOUS

## 2023-06-30 MED ORDER — ONDANSETRON HCL 4 MG/2ML IJ SOLN
INTRAMUSCULAR | Status: DC | PRN
  Administered 2023-06-30: 4 mg via INTRAVENOUS

## 2023-06-30 MED ORDER — ROCURONIUM BROMIDE 10 MG/ML (PF) SYRINGE
PREFILLED_SYRINGE | INTRAVENOUS | Status: DC | PRN
  Administered 2023-06-30: 15 mg via INTRAVENOUS
  Administered 2023-06-30: 30 mg via INTRAVENOUS

## 2023-06-30 MED ORDER — LACTATED RINGERS IV SOLN: INTRAVENOUS | Status: DC

## 2023-06-30 MED ORDER — BUPIVACAINE-EPINEPHRINE (PF) 0.5% -1:200000 IJ SOLN
INTRAMUSCULAR | Status: AC
  Filled 2023-06-30: qty 30

## 2023-06-30 MED ORDER — CHLORHEXIDINE GLUCONATE CLOTH 2 % EX PADS: 6.0000 | MEDICATED_PAD | Freq: Once | CUTANEOUS | Status: DC

## 2023-06-30 MED ORDER — MIDAZOLAM HCL 2 MG/2ML IJ SOLN
INTRAMUSCULAR | Status: DC | PRN
  Administered 2023-06-30: 1 mg via INTRAVENOUS

## 2023-06-30 MED ORDER — PROPOFOL 10 MG/ML IV BOLUS
INTRAVENOUS | Status: AC
  Filled 2023-06-30: qty 20

## 2023-06-30 MED ORDER — ORAL CARE MOUTH RINSE: 15.0000 mL | Freq: Once | OROMUCOSAL | Status: AC

## 2023-06-30 MED ORDER — FENTANYL CITRATE (PF) 250 MCG/5ML IJ SOLN
INTRAMUSCULAR | Status: AC
  Filled 2023-06-30: qty 5

## 2023-06-30 MED ORDER — CHLORHEXIDINE GLUCONATE 0.12 % MT SOLN
OROMUCOSAL | Status: AC
  Administered 2023-06-30: 15 mL via OROMUCOSAL
  Filled 2023-06-30: qty 15

## 2023-06-30 MED ORDER — IOPAMIDOL (ISOVUE-300) INJECTION 61%
INTRAVENOUS | Status: DC | PRN
  Administered 2023-06-30: 100 mL

## 2023-06-30 MED ORDER — LIDOCAINE 2% (20 MG/ML) 5 ML SYRINGE
INTRAMUSCULAR | Status: DC | PRN
  Administered 2023-06-30: 80 mg via INTRAVENOUS

## 2023-06-30 MED ORDER — BACITRACIN ZINC 500 UNIT/GM EX OINT
TOPICAL_OINTMENT | CUTANEOUS | Status: AC
  Filled 2023-06-30: qty 28.35

## 2023-06-30 MED ORDER — CEFAZOLIN SODIUM-DEXTROSE 2-4 GM/100ML-% IV SOLN
2.0000 g | INTRAVENOUS | Status: AC
  Administered 2023-06-30: 2 g via INTRAVENOUS

## 2023-06-30 MED ORDER — ACETAMINOPHEN 500 MG PO TABS
1000.0000 mg | ORAL_TABLET | Freq: Once | ORAL | Status: AC
  Administered 2023-06-30: 1000 mg via ORAL
  Filled 2023-06-30: qty 2

## 2023-06-30 MED ORDER — BACITRACIN ZINC 500 UNIT/GM EX OINT
TOPICAL_OINTMENT | CUTANEOUS | Status: DC | PRN
  Administered 2023-06-30: 1 via TOPICAL

## 2023-06-30 MED ORDER — BUPIVACAINE-EPINEPHRINE 0.5% -1:200000 IJ SOLN
INTRAMUSCULAR | Status: DC | PRN
  Administered 2023-06-30: 10 mL

## 2023-06-30 MED ORDER — PHENYLEPHRINE HCL-NACL 20-0.9 MG/250ML-% IV SOLN
INTRAVENOUS | Status: DC | PRN
  Administered 2023-06-30: 40 ug/min via INTRAVENOUS

## 2023-06-30 MED ORDER — MIDAZOLAM HCL 2 MG/2ML IJ SOLN
INTRAMUSCULAR | Status: AC
  Filled 2023-06-30: qty 2

## 2023-06-30 MED ORDER — CHLORHEXIDINE GLUCONATE 0.12 % MT SOLN: 15.0000 mL | Freq: Once | OROMUCOSAL | Status: AC

## 2023-06-30 MED ORDER — 0.9 % SODIUM CHLORIDE (POUR BTL) OPTIME
TOPICAL | Status: DC | PRN
  Administered 2023-06-30: 1000 mL

## 2023-06-30 MED ORDER — FENTANYL CITRATE (PF) 250 MCG/5ML IJ SOLN
INTRAMUSCULAR | Status: DC | PRN
  Administered 2023-06-30: 1 ug via INTRAVENOUS

## 2023-06-30 SURGICAL SUPPLY — 40 items
APL SKNCLS STERI-STRIP NONHPOA (GAUZE/BANDAGES/DRESSINGS) ×1
BAG COUNTER SPONGE SURGICOUNT (BAG) ×1 IMPLANT
BAG SPNG CNTER NS LX DISP (BAG) ×1
BENZOIN TINCTURE PRP APPL 2/3 (GAUZE/BANDAGES/DRESSINGS) ×1 IMPLANT
BLADE CLIPPER SURG (BLADE) IMPLANT
BLADE SURG 15 STRL LF DISP TIS (BLADE) ×1 IMPLANT
BLADE SURG 15 STRL SS (BLADE) ×1
CEMENT KYPHON C01A KIT/MIXER (Cement) IMPLANT
DEVICE BIOPSY BONE KYPHX (INSTRUMENTS) IMPLANT
DRAPE C-ARM 42X72 X-RAY (DRAPES) ×1 IMPLANT
DRAPE HALF SHEET 40X57 (DRAPES) ×1 IMPLANT
DRAPE INCISE IOBAN 66X45 STRL (DRAPES) ×1 IMPLANT
DRAPE LAPAROTOMY 100X72X124 (DRAPES) ×1 IMPLANT
DRAPE SURG 17X23 STRL (DRAPES) ×4 IMPLANT
DRAPE WARM FLUID 44X44 (DRAPES) ×1 IMPLANT
DRSG OPSITE POSTOP 4X6 (GAUZE/BANDAGES/DRESSINGS) IMPLANT
GAUZE 4X4 16PLY ~~LOC~~+RFID DBL (SPONGE) ×1 IMPLANT
GAUZE SPONGE 4X4 12PLY STRL (GAUZE/BANDAGES/DRESSINGS) ×1 IMPLANT
GLOVE BIO SURGEON STRL SZ8 (GLOVE) ×1 IMPLANT
GLOVE BIO SURGEON STRL SZ8.5 (GLOVE) ×1 IMPLANT
GLOVE EXAM NITRILE XL STR (GLOVE) IMPLANT
GOWN STRL REUS W/ TWL LRG LVL3 (GOWN DISPOSABLE) IMPLANT
GOWN STRL REUS W/ TWL XL LVL3 (GOWN DISPOSABLE) IMPLANT
GOWN STRL REUS W/TWL LRG LVL3 (GOWN DISPOSABLE)
GOWN STRL REUS W/TWL XL LVL3 (GOWN DISPOSABLE)
KIT BASIN OR (CUSTOM PROCEDURE TRAY) ×1 IMPLANT
KIT POSITION SURG JACKSON T1 (MISCELLANEOUS) ×1 IMPLANT
KIT TURNOVER KIT B (KITS) ×1 IMPLANT
NDL HYPO 22X1.5 SAFETY MO (MISCELLANEOUS) ×1 IMPLANT
NEEDLE HYPO 22X1.5 SAFETY MO (MISCELLANEOUS) ×1 IMPLANT
NS IRRIG 1000ML POUR BTL (IV SOLUTION) ×1 IMPLANT
PACK EENT II TURBAN DRAPE (CUSTOM PROCEDURE TRAY) ×1 IMPLANT
PAD ARMBOARD 7.5X6 YLW CONV (MISCELLANEOUS) ×3 IMPLANT
SPECIMEN JAR SMALL (MISCELLANEOUS) IMPLANT
STRIP CLOSURE SKIN 1/2X4 (GAUZE/BANDAGES/DRESSINGS) ×1 IMPLANT
SUT VIC AB 3-0 SH 8-18 (SUTURE) ×1 IMPLANT
SYR CONTROL 10ML LL (SYRINGE) ×1 IMPLANT
TOWEL GREEN STERILE (TOWEL DISPOSABLE) ×1 IMPLANT
TOWEL GREEN STERILE FF (TOWEL DISPOSABLE) ×1 IMPLANT
TRAY KYPHOPAK 20/3 ONESTEP 1ST (MISCELLANEOUS) IMPLANT

## 2023-06-30 NOTE — Op Note (Signed)
Brief history: The patient is a 77 year old white female with a history of cancer previous back problems/surgery.  She complained of increasing back pain she was worked up with her x-rays, lumbar MRI and a lumbar CT scan which demonstrated an L2 compression fracture.  I discussed the various treatment options with her.  She has decided proceed with a L2 kyphoplasty.  Preop diagnosis: Lumbar pathology compression fracture in an osteoporotic elderly patient with delayed healing, lumbago  Postop diagnosis: The same  Procedure: L2 kyphoplasty; L2 vertebral body biopsy  Surgeon: Dr. Delma Officer  Assistant: None  Anesthesia: General Endotracheal  Estimated blood loss: Minimal  Specimens: Vertebral body biopsy  Drains: None  Complications: None  Description of procedure: The patient was brought to the operating room by the anesthesia team.  General endotracheal anesthesia was induced.  The patient was turned to the prone position on the Blanche table.  Her thoracolumbar region was then prepared with Betadine scrub and Betadine solution.  I injected the area to be incised with Marcaine solution.  I then used the scalpel to make small incisions over the bilateral L2 pedicles.  Under biplanar fluoroscopy I cannulated the bilateral L2 pedicles and vertebral bodies with a Jamshidi needle.  I obtained bilateral vertebral body biopsies.  I then inserted a drill, drilled, removed the drill, inserted the balloon, inflated balloon and removed the balloons.  Performed a bilateral L2 kyphoplasty under fluoroscopic guidance.  After I was satisfied with the cement filling I removed the Jamshidi needles.  I then reapproximated patient's subcutaneous tissue with interrupted 3-0 Vicryl suture.  I reapproximated the skin with Steri-Strips and benzoin.  The wounds were then coated with bacitracin ointment.  A sterile dressing was applied.  The drapes were removed.  By report all sponge, instrument, and needle counts  were correct at the end of this case.

## 2023-06-30 NOTE — Anesthesia Procedure Notes (Addendum)
Procedure Name: Intubation Date/Time: 06/30/2023 2:50 PM  Performed by: Kayleen Memos, CRNAPre-anesthesia Checklist: Patient identified, Emergency Drugs available, Suction available and Patient being monitored Patient Re-evaluated:Patient Re-evaluated prior to induction Oxygen Delivery Method: Circle System Utilized Preoxygenation: Pre-oxygenation with 100% oxygen Induction Type: IV induction Ventilation: Mask ventilation without difficulty Laryngoscope Size: Glidescope and 3 Grade View: Grade I Tube type: Oral Tube size: 7.0 mm Number of attempts: 1 Airway Equipment and Method: Rigid stylet and Bite block Placement Confirmation: ETT inserted through vocal cords under direct vision, positive ETCO2 and breath sounds checked- equal and bilateral Secured at: 21 cm Tube secured with: Tape Dental Injury: Teeth and Oropharynx as per pre-operative assessment  Comments: Glidescope used electively for history of difficult airway r/t small mouth opening and anterior position of larynx. Grade 1 view with Glide 3, no difficulty passing tube through cords.
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
## 2023-06-30 NOTE — Transfer of Care (Signed)
Immediate Anesthesia Transfer of Care Note  Patient: MULAN YARWOOD  Procedure(s) Performed: KYPHOPLASTY LUMBAR TWO, VERTEBRAL BODY BIOPSY (Spine Lumbar)  Patient Location: PACU  Anesthesia Type:General  Level of Consciousness: awake and drowsy  Airway & Oxygen Therapy: Patient Spontanous Breathing and Patient connected to nasal cannula oxygen  Post-op Assessment: Report given to RN and Post -op Vital signs reviewed and stable  Post vital signs: Reviewed and stable  Last Vitals:  Vitals Value Taken Time  BP 139/57 06/30/23 1556  Temp    Pulse 95 06/30/23 1558  Resp 19 06/30/23 1558  SpO2 100 % 06/30/23 1558  Vitals shown include unfiled device data.  Last Pain:  Vitals:   06/30/23 1327  TempSrc:   PainSc: 6          Complications: No notable events documented.

## 2023-07-01 ENCOUNTER — Encounter (HOSPITAL_COMMUNITY): Payer: Self-pay | Admitting: Neurosurgery

## 2023-07-01 NOTE — Anesthesia Postprocedure Evaluation (Signed)
Anesthesia Post Note  Patient: Jenny White  Procedure(s) Performed: KYPHOPLASTY LUMBAR TWO, VERTEBRAL BODY BIOPSY (Spine Lumbar)     Patient location during evaluation: PACU Anesthesia Type: General Level of consciousness: awake and alert Pain management: pain level controlled Vital Signs Assessment: post-procedure vital signs reviewed and stable Respiratory status: spontaneous breathing, nonlabored ventilation, respiratory function stable and patient connected to nasal cannula oxygen Cardiovascular status: blood pressure returned to baseline and stable Postop Assessment: no apparent nausea or vomiting Anesthetic complications: no   No notable events documented.  Last Vitals:  Vitals:   06/30/23 1615 06/30/23 1630  BP: (!) 153/75 (!) 161/75  Pulse: 73 83  Resp: 18 19  Temp:  37 C  SpO2: 97% 96%    Last Pain:  Vitals:   06/30/23 1630  TempSrc:   PainSc: 0-No pain                 Collene Schlichter

## 2023-07-02 LAB — SURGICAL PATHOLOGY

## 2023-07-04 ENCOUNTER — Telehealth: Payer: Medicare Other | Admitting: Nurse Practitioner

## 2023-07-04 DIAGNOSIS — J449 Chronic obstructive pulmonary disease, unspecified: Secondary | ICD-10-CM | POA: Diagnosis not present

## 2023-07-04 MED ORDER — ALBUTEROL SULFATE HFA 108 (90 BASE) MCG/ACT IN AERS
2.0000 | INHALATION_SPRAY | Freq: Four times a day (QID) | RESPIRATORY_TRACT | 0 refills | Status: DC | PRN
Start: 2023-07-04 — End: 2023-12-11

## 2023-07-04 NOTE — Progress Notes (Signed)
Virtual Visit Consent   [name], you are scheduled for a virtual visit with a [facility] provider today. Just as with appointments in the office, your consent must be obtained to participate. Your consent will be active for this visit and any virtual visit you may have with one of our providers in the next 365 days. If you have a MyChart account, a copy of this consent can be sent to you electronically.  As this is a virtual visit, video technology does not allow for your provider to perform a traditional examination. This may limit your provider's ability to fully assess your condition. If your provider identifies any concerns that need to be evaluated in person or the need to arrange testing (such as labs, EKG, etc.), we will make arrangements to do so. Although advances in technology are sophisticated, we cannot ensure that it will always work on either your end or our end. If the connection with a video visit is poor, the visit may have to be switched to a telephone visit. With either a video or telephone visit, we are not always able to ensure that we have a secure connection.  By engaging in this virtual visit, you consent to the provision of healthcare and authorize for your insurance to be billed (if applicable) for the services provided during this visit. Depending on your insurance coverage, you may receive a charge related to this service.  I need to obtain your verbal consent now. Are you willing to proceed with your visit today? Jenny White has provided verbal consent on 07/04/2023 for a virtual visit (video or telephone). Viviano Simas, FNP  Date: 07/04/2023 6:21 PM  Virtual Visit via Video Note   I, Viviano Simas, connected with  Jenny White  ([id number], 1946-07-16) on 07/04/23 at  6:45 PM EDT by a video-enabled telemedicine application and verified that I am speaking with the correct person using two identifiers.  Location: Patient: Virtual Visit Location Patient: Home Provider:  Virtual Visit Location Provider: Home Office   I discussed the limitations of evaluation and management by telemedicine and the availability of in person appointments. The patient expressed understanding and agreed to proceed.    History of Present Illness: Jenny White is a 77 y.o. who identifies as a female who was assigned female at birth, and is being seen today for medication refill request.   She has run out of her Albuterol inhaler and is unable to go the weekend without it.   She on average uses this twice daily.   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   Lumbar stenosis 06/28/2020    Allergies:  Allergies  Allergen Reactions   Demerol Hcl [Meperidine] Nausea And Vomiting, Nausea Only and Other (See Comments)   Percocet [Oxycodone-Acetaminophen] Nausea And Vomiting   Medications:  Current Outpatient Medications:    acetaminophen (TYLENOL) 500 MG tablet, Take 1,000 mg by mouth 2 (two) times daily as needed for moderate pain., Disp: , Rfl:    albuterol (VENTOLIN HFA) 108 (90 Base) MCG/ACT inhaler, Inhale 2 puffs into the lungs every 4 (four) hours as needed for wheezing or shortness of breath., Disp: , Rfl:  alendronate (FOSAMAX) 70 MG tablet, Take 70 mg by mouth once a week., Disp: , Rfl:    aspirin EC 81 MG tablet, Take 81 mg by mouth every evening. Swallow whole., Disp: , Rfl:    Cholecalciferol (VITAMIN D) 50 MCG (2000 UT) tablet, Take 2,000 Units by mouth every evening., Disp: , Rfl:    cyclobenzaprine (FLEXERIL) 5 MG tablet, Take 5 mg by mouth 3 (three) times daily as needed for muscle spasms., Disp: , Rfl:    docusate sodium (COLACE) 100 MG capsule, Take 1 capsule (100 mg  total) by mouth 2 (two) times daily., Disp: 30 capsule, Rfl: 0   Fluticasone-Umeclidin-Vilant (TRELEGY ELLIPTA) 100-62.5-25 MCG/INH AEPB, Inhale 1 puff into the lungs daily., Disp: , Rfl:    gabapentin (NEURONTIN) 100 MG capsule, Take 100-300 mg by mouth See admin instructions. Take 100 mg by mouth in the morning 100 mg at noon  and 300 mg by mouth at bedtime, Disp: , Rfl:    HYDROcodone-acetaminophen (NORCO/VICODIN) 5-325 MG tablet, Take 0.5 tablets by mouth in the morning, at noon, in the evening, and at bedtime., Disp: , Rfl:    ibuprofen (ADVIL) 200 MG tablet, Take 600 mg by mouth 3 (three) times daily., Disp: , Rfl:    lisinopril-hydrochlorothiazide (PRINZIDE,ZESTORETIC) 10-12.5 MG tablet, Take 1 tablet by mouth in the morning., Disp: , Rfl:    loratadine (CLARITIN) 10 MG tablet, Take 10 mg by mouth every evening., Disp: , Rfl:    LORazepam (ATIVAN) 0.5 MG tablet, 1 tab po 30 minutes prior to radiation planning or treatment, Disp: 30 tablet, Rfl: 0   montelukast (SINGULAIR) 10 MG tablet, Take 10 mg by mouth at bedtime., Disp: , Rfl:    nicotine polacrilex (NICORETTE) 2 MG gum, Take 2 mg by mouth as needed for smoking cessation., Disp: , Rfl:    pantoprazole (PROTONIX) 20 MG tablet, Take 20 mg by mouth 2 (two) times daily., Disp: , Rfl:   Observations/Objective: Patient is well-developed, well-nourished in no acute distress.  Resting comfortably  at home.  Head is normocephalic, atraumatic.  No labored breathing.  Speech is clear and coherent with logical content.  Patient is alert and oriented at baseline.    Assessment and Plan: 1. Chronic obstructive pulmonary disease, unspecified COPD type (HCC) Refill provided follow up with care team as planned   - albuterol (VENTOLIN HFA) 108 (90 Base) MCG/ACT inhaler; Inhale 2 puffs into the lungs every 6 (six) hours as needed for wheezing or shortness of breath.  Dispense: 8 g; Refill: 0     Follow Up Instructions: I discussed the assessment  and treatment plan with the patient. The patient was provided an opportunity to ask questions and all were answered. The patient agreed with the plan and demonstrated an understanding of the instructions.  A copy of instructions were sent to the patient via MyChart unless otherwise noted below.    The patient was advised to call back or seek an in-person evaluation if the symptoms worsen or if the condition fails to improve as anticipated.    Viviano Simas, FNP

## 2023-07-06 DIAGNOSIS — C3431 Malignant neoplasm of lower lobe, right bronchus or lung: Secondary | ICD-10-CM | POA: Insufficient documentation

## 2023-07-06 DIAGNOSIS — C3412 Malignant neoplasm of upper lobe, left bronchus or lung: Secondary | ICD-10-CM | POA: Diagnosis present

## 2023-07-06 DIAGNOSIS — Z510 Encounter for antineoplastic radiation therapy: Secondary | ICD-10-CM | POA: Insufficient documentation

## 2023-07-08 ENCOUNTER — Ambulatory Visit
Admission: RE | Admit: 2023-07-08 | Discharge: 2023-07-08 | Discharge status: Home / Self Care | Payer: Medicare Other | Source: Ambulatory Visit | Attending: Radiation Oncology | Admitting: Radiation Oncology

## 2023-07-08 ENCOUNTER — Other Ambulatory Visit: Payer: Self-pay

## 2023-07-08 ENCOUNTER — Ambulatory Visit
Admission: RE | Admit: 2023-07-08 | Discharge: 2023-07-08 | Disposition: A | Discharge status: Home / Self Care | Payer: Medicare Other | Source: Ambulatory Visit | Attending: Radiation Oncology | Admitting: Radiation Oncology

## 2023-07-08 DIAGNOSIS — Z510 Encounter for antineoplastic radiation therapy: Secondary | ICD-10-CM | POA: Diagnosis not present

## 2023-07-08 LAB — RAD ONC ARIA SESSION SUMMARY
Course Elapsed Days: 0
Plan Fractions Treated to Date: 1
Plan Fractions Treated to Date: 1
Plan Prescribed Dose Per Fraction: 12 Gy
Plan Prescribed Dose Per Fraction: 7.5 Gy
Plan Total Fractions Prescribed: 5
Plan Total Fractions Prescribed: 8
Plan Total Prescribed Dose: 60 Gy
Plan Total Prescribed Dose: 60 Gy
Reference Point Dosage Given to Date: 12 Gy
Reference Point Dosage Given to Date: 7.5 Gy
Reference Point Session Dosage Given: 12 Gy
Reference Point Session Dosage Given: 7.5 Gy
Session Number: 1

## 2023-07-09 ENCOUNTER — Telehealth: Payer: Self-pay | Admitting: Pulmonary Disease

## 2023-07-09 ENCOUNTER — Other Ambulatory Visit: Payer: Self-pay

## 2023-07-09 ENCOUNTER — Ambulatory Visit: Payer: Medicare Other

## 2023-07-09 ENCOUNTER — Ambulatory Visit
Admission: RE | Admit: 2023-07-09 | Discharge: 2023-07-09 | Discharge status: Home / Self Care | Payer: Medicare Other | Source: Ambulatory Visit | Attending: Radiation Oncology

## 2023-07-09 DIAGNOSIS — Z510 Encounter for antineoplastic radiation therapy: Secondary | ICD-10-CM | POA: Diagnosis not present

## 2023-07-09 LAB — RAD ONC ARIA SESSION SUMMARY
Course Elapsed Days: 1
Plan Fractions Treated to Date: 2
Plan Prescribed Dose Per Fraction: 7.5 Gy
Plan Total Fractions Prescribed: 8
Plan Total Prescribed Dose: 60 Gy
Reference Point Dosage Given to Date: 15 Gy
Reference Point Session Dosage Given: 7.5 Gy
Session Number: 2

## 2023-07-10 ENCOUNTER — Ambulatory Visit
Admission: RE | Admit: 2023-07-10 | Discharge: 2023-07-10 | Disposition: A | Discharge status: Home / Self Care | Payer: Medicare Other | Source: Ambulatory Visit | Attending: Radiation Oncology | Admitting: Radiation Oncology

## 2023-07-10 ENCOUNTER — Ambulatory Visit: Payer: Medicare Other

## 2023-07-10 ENCOUNTER — Other Ambulatory Visit: Payer: Self-pay

## 2023-07-10 DIAGNOSIS — Z510 Encounter for antineoplastic radiation therapy: Secondary | ICD-10-CM | POA: Diagnosis not present

## 2023-07-10 LAB — RAD ONC ARIA SESSION SUMMARY
Course Elapsed Days: 2
Plan Fractions Treated to Date: 2
Plan Fractions Treated to Date: 3
Plan Prescribed Dose Per Fraction: 12 Gy
Plan Prescribed Dose Per Fraction: 7.5 Gy
Plan Total Fractions Prescribed: 5
Plan Total Fractions Prescribed: 8
Plan Total Prescribed Dose: 60 Gy
Plan Total Prescribed Dose: 60 Gy
Reference Point Dosage Given to Date: 22.5 Gy
Reference Point Dosage Given to Date: 24 Gy
Reference Point Session Dosage Given: 12 Gy
Reference Point Session Dosage Given: 7.5 Gy
Session Number: 3

## 2023-07-10 NOTE — Telephone Encounter (Signed)
Strongly prefer she be seen by PCP or UC if we can't get her in here but if comfortable that prednisone has worked for the exact same symptoms before then ok to offer  Prednisone 10 mg take  4 each am x 2 days,   2 each am x 2 days,  1 each am x 2 days and stop   If not responding to pred or albuterol go to ER

## 2023-07-10 NOTE — Telephone Encounter (Signed)
Patient LOV was 06/23/23 with Huntley Dec, NP and 04/29/23 with Dr. Tonia Brooms. ATC patient.  LM on answering machine to call office when available.

## 2023-07-10 NOTE — Telephone Encounter (Signed)
Spoke with the pt  She states having increased DOE this past wk- winded walking room to room at home She had Kyphoplasty surgery done 06/30/23  She denies any fevers, cough, wheezing  She is using her albuterol inhaler about 4 x per day and it does help some  She takes trelegy  She asks if we can call in steroid to help with her breathing  Please advise, thanks!

## 2023-07-10 NOTE — Telephone Encounter (Signed)
Patient had a chiroplasty procedure and has shortness of breath when moving/daily activities. Patient is wanting to know if something can be called in.

## 2023-07-11 ENCOUNTER — Other Ambulatory Visit: Payer: Self-pay

## 2023-07-11 ENCOUNTER — Ambulatory Visit: Payer: Medicare Other

## 2023-07-11 ENCOUNTER — Ambulatory Visit
Admission: RE | Admit: 2023-07-11 | Discharge: 2023-07-11 | Disposition: A | Discharge status: Home / Self Care | Payer: Medicare Other | Source: Ambulatory Visit | Attending: Radiation Oncology | Admitting: Radiation Oncology

## 2023-07-11 DIAGNOSIS — C3431 Malignant neoplasm of lower lobe, right bronchus or lung: Secondary | ICD-10-CM | POA: Insufficient documentation

## 2023-07-11 DIAGNOSIS — Z510 Encounter for antineoplastic radiation therapy: Secondary | ICD-10-CM | POA: Diagnosis present

## 2023-07-11 DIAGNOSIS — C3412 Malignant neoplasm of upper lobe, left bronchus or lung: Secondary | ICD-10-CM | POA: Insufficient documentation

## 2023-07-11 LAB — RAD ONC ARIA SESSION SUMMARY
Course Elapsed Days: 3
Plan Fractions Treated to Date: 4
Plan Prescribed Dose Per Fraction: 7.5 Gy
Plan Total Fractions Prescribed: 8
Plan Total Prescribed Dose: 60 Gy
Reference Point Dosage Given to Date: 30 Gy
Reference Point Session Dosage Given: 7.5 Gy
Session Number: 4

## 2023-07-11 MED ORDER — PREDNISONE 10 MG PO TABS: ORAL_TABLET | ORAL | 0 refills | Status: DC

## 2023-07-11 NOTE — Telephone Encounter (Signed)
Patient is aware of below message/recommendations and voiced her understanding.  Prednisone sent to preferred pharmacy. Nothing further needed.

## 2023-07-14 ENCOUNTER — Other Ambulatory Visit: Payer: Self-pay

## 2023-07-14 ENCOUNTER — Ambulatory Visit
Admission: RE | Admit: 2023-07-14 | Discharge: 2023-07-14 | Disposition: A | Discharge status: Home / Self Care | Payer: Medicare Other | Source: Ambulatory Visit | Attending: Radiation Oncology | Admitting: Radiation Oncology

## 2023-07-14 ENCOUNTER — Ambulatory Visit: Admission: RE | Admit: 2023-07-14 | Payer: Medicare Other | Source: Ambulatory Visit | Admitting: Radiation Oncology

## 2023-07-14 DIAGNOSIS — Z510 Encounter for antineoplastic radiation therapy: Secondary | ICD-10-CM | POA: Diagnosis not present

## 2023-07-14 LAB — RAD ONC ARIA SESSION SUMMARY
Course Elapsed Days: 6
Plan Fractions Treated to Date: 3
Plan Fractions Treated to Date: 5
Plan Prescribed Dose Per Fraction: 12 Gy
Plan Prescribed Dose Per Fraction: 7.5 Gy
Plan Total Fractions Prescribed: 5
Plan Total Fractions Prescribed: 8
Plan Total Prescribed Dose: 60 Gy
Plan Total Prescribed Dose: 60 Gy
Reference Point Dosage Given to Date: 36 Gy
Reference Point Dosage Given to Date: 37.5 Gy
Reference Point Session Dosage Given: 12 Gy
Reference Point Session Dosage Given: 7.5 Gy
Session Number: 5

## 2023-07-15 ENCOUNTER — Ambulatory Visit
Admission: RE | Admit: 2023-07-15 | Discharge: 2023-07-15 | Disposition: A | Discharge status: Home / Self Care | Payer: Medicare Other | Source: Ambulatory Visit | Attending: Radiation Oncology

## 2023-07-15 ENCOUNTER — Other Ambulatory Visit: Payer: Self-pay

## 2023-07-15 DIAGNOSIS — Z510 Encounter for antineoplastic radiation therapy: Secondary | ICD-10-CM | POA: Diagnosis not present

## 2023-07-15 LAB — RAD ONC ARIA SESSION SUMMARY
Course Elapsed Days: 7
Plan Fractions Treated to Date: 6
Plan Prescribed Dose Per Fraction: 7.5 Gy
Plan Total Fractions Prescribed: 8
Plan Total Prescribed Dose: 60 Gy
Reference Point Dosage Given to Date: 45 Gy
Reference Point Session Dosage Given: 7.5 Gy
Session Number: 6

## 2023-07-16 ENCOUNTER — Other Ambulatory Visit: Payer: Self-pay

## 2023-07-16 ENCOUNTER — Ambulatory Visit: Admission: RE | Admit: 2023-07-16 | Payer: Medicare Other | Source: Ambulatory Visit | Admitting: Radiation Oncology

## 2023-07-16 ENCOUNTER — Ambulatory Visit
Admission: RE | Admit: 2023-07-16 | Discharge: 2023-07-16 | Disposition: A | Discharge status: Home / Self Care | Payer: Medicare Other | Source: Ambulatory Visit | Attending: Radiation Oncology | Admitting: Radiation Oncology

## 2023-07-16 DIAGNOSIS — Z510 Encounter for antineoplastic radiation therapy: Secondary | ICD-10-CM | POA: Diagnosis not present

## 2023-07-16 LAB — RAD ONC ARIA SESSION SUMMARY
Course Elapsed Days: 8
Plan Fractions Treated to Date: 4
Plan Fractions Treated to Date: 7
Plan Prescribed Dose Per Fraction: 12 Gy
Plan Prescribed Dose Per Fraction: 7.5 Gy
Plan Total Fractions Prescribed: 5
Plan Total Fractions Prescribed: 8
Plan Total Prescribed Dose: 60 Gy
Plan Total Prescribed Dose: 60 Gy
Reference Point Dosage Given to Date: 48 Gy
Reference Point Dosage Given to Date: 52.5 Gy
Reference Point Session Dosage Given: 12 Gy
Reference Point Session Dosage Given: 7.5 Gy
Session Number: 7

## 2023-07-17 ENCOUNTER — Other Ambulatory Visit: Payer: Self-pay

## 2023-07-17 ENCOUNTER — Ambulatory Visit
Admission: RE | Admit: 2023-07-17 | Discharge: 2023-07-17 | Disposition: A | Discharge status: Home / Self Care | Payer: Medicare Other | Source: Ambulatory Visit | Attending: Radiation Oncology | Admitting: Radiation Oncology

## 2023-07-17 DIAGNOSIS — Z510 Encounter for antineoplastic radiation therapy: Secondary | ICD-10-CM | POA: Diagnosis not present

## 2023-07-17 LAB — RAD ONC ARIA SESSION SUMMARY
Course Elapsed Days: 9
Plan Fractions Treated to Date: 8
Plan Prescribed Dose Per Fraction: 7.5 Gy
Plan Total Fractions Prescribed: 8
Plan Total Prescribed Dose: 60 Gy
Reference Point Dosage Given to Date: 60 Gy
Reference Point Session Dosage Given: 7.5 Gy
Session Number: 8

## 2023-07-18 ENCOUNTER — Other Ambulatory Visit: Payer: Self-pay

## 2023-07-18 ENCOUNTER — Ambulatory Visit
Admission: RE | Admit: 2023-07-18 | Discharge: 2023-07-18 | Disposition: A | Discharge status: Home / Self Care | Payer: Medicare Other | Source: Ambulatory Visit | Attending: Radiation Oncology | Admitting: Radiation Oncology

## 2023-07-18 ENCOUNTER — Ambulatory Visit
Admission: RE | Admit: 2023-07-18 | Discharge: 2023-07-18 | Disposition: A | Discharge status: Home / Self Care | Payer: Medicare Other | Source: Ambulatory Visit | Attending: Radiation Oncology

## 2023-07-18 DIAGNOSIS — Z510 Encounter for antineoplastic radiation therapy: Secondary | ICD-10-CM | POA: Diagnosis not present

## 2023-07-18 LAB — RAD ONC ARIA SESSION SUMMARY
Course Elapsed Days: 10
Plan Fractions Treated to Date: 5
Plan Prescribed Dose Per Fraction: 12 Gy
Plan Total Fractions Prescribed: 5
Plan Total Prescribed Dose: 60 Gy
Reference Point Dosage Given to Date: 60 Gy
Reference Point Session Dosage Given: 12 Gy
Session Number: 9

## 2023-07-21 NOTE — Radiation Completion Notes (Signed)
  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, cT1aN0M0 NSCLC of the LUL though cytololgy only showed atypical cells   Intent: Curative     ==========DELIVERED PLANS==========  First Treatment Date: 2023-07-08 - Last Treatment Date: 2023-07-18   Plan Name: Lung_R_UHRT Site: Lung, Right Technique: IMRT Mode: Photon Dose Per Fraction: 7.5 Gy Prescribed Dose (Delivered / Prescribed): 60 Gy / 60 Gy Prescribed Fxs (Delivered / Prescribed): 8 / 8   Plan Name: Lung_L_SBRT Site: Lung, Left Technique: SBRT/SRT-IMRT Mode: Photon Dose Per Fraction: 12 Gy Prescribed Dose (Delivered / Prescribed): 60 Gy / 60 Gy Prescribed Fxs (Delivered / Prescribed): 5 / 5     ==========ON TREATMENT VISIT DATES========== 2023-07-08, 2023-07-10, 2023-07-11, 2023-07-14, 2023-07-16, 2023-07-18, 2023-07-18   See weekly On Treatment Notes in Epic for details.The patient tolerated radiation. She developed fatigue during therapy but no other complaints during treatment.  The patient will receive a call in about one month from the radiation oncology department. She will continue follow up with Dr. Arbutus Ped as well.      Osker Mason, PAC

## 2023-08-04 ENCOUNTER — Other Ambulatory Visit: Payer: Self-pay | Admitting: Nurse Practitioner

## 2023-08-04 DIAGNOSIS — J449 Chronic obstructive pulmonary disease, unspecified: Secondary | ICD-10-CM

## 2023-08-15 ENCOUNTER — Telehealth: Payer: Self-pay

## 2023-08-15 NOTE — Progress Notes (Signed)
  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   Symptoms of fatigue have not improved since completing therapy. Patient is still extremely SOB. She is currently taking Prednisone, that seems to be helping significantly.  Dr. Asa Lente team has been notified and will try to get her a f/u apt. asap.  Symptoms of skin changes have improved since completing therapy.  Symptoms of esophagitis have improved since completing therapy.  The patient has scheduled follow up with her medical oncologist Dr. Arbutus Ped for ongoing care, and was encouraged to call if she develops concerns or questions regarding radiation.   This concludes the interaction.  Ruel Favors, LPN

## 2023-08-15 NOTE — Telephone Encounter (Signed)
Received a message from the post treatment nurse in radiation about pt being SOB with prednisone and would like a call from Dr. Arbutus Ped office. Per [place], [place]- patient should f/u with her pulmonologist Dr. Sherene Sires. If severe SOB, go to the ER.  LVM for a return call.

## 2023-08-18 ENCOUNTER — Ambulatory Visit
Admission: RE | Admit: 2023-08-18 | Discharge: 2023-08-18 | Disposition: A | Discharge status: Home / Self Care | Payer: Medicare Other | Source: Ambulatory Visit | Attending: Radiation Oncology | Admitting: Radiation Oncology

## 2023-08-18 DIAGNOSIS — C3412 Malignant neoplasm of upper lobe, left bronchus or lung: Secondary | ICD-10-CM | POA: Insufficient documentation

## 2023-08-18 DIAGNOSIS — C3431 Malignant neoplasm of lower lobe, right bronchus or lung: Secondary | ICD-10-CM | POA: Insufficient documentation

## 2023-08-18 DIAGNOSIS — Z510 Encounter for antineoplastic radiation therapy: Secondary | ICD-10-CM | POA: Insufficient documentation

## 2023-09-05 ENCOUNTER — Other Ambulatory Visit: Payer: Self-pay | Admitting: Pulmonary Disease

## 2023-09-05 DIAGNOSIS — C3431 Malignant neoplasm of lower lobe, right bronchus or lung: Secondary | ICD-10-CM

## 2023-09-08 ENCOUNTER — Ambulatory Visit
Admission: RE | Admit: 2023-09-08 | Discharge: 2023-09-08 | Disposition: A | Discharge status: Home / Self Care | Payer: Medicare Other | Source: Ambulatory Visit | Attending: Pulmonary Disease | Admitting: Pulmonary Disease

## 2023-09-08 VITALS — Ht 65.0 in | Wt 122.0 lb

## 2023-09-08 DIAGNOSIS — C3431 Malignant neoplasm of lower lobe, right bronchus or lung: Secondary | ICD-10-CM | POA: Diagnosis present

## 2023-09-08 LAB — GLUCOSE, CAPILLARY: Glucose-Capillary: 78 mg/dL (ref 70–99)

## 2023-09-08 MED ORDER — FLUDEOXYGLUCOSE F - 18 (FDG) INJECTION
6.4900 | Freq: Once | INTRAVENOUS | Status: AC | PRN
  Administered 2023-09-08: 6.49 via INTRAVENOUS

## 2023-10-15 ENCOUNTER — Inpatient Hospital Stay: Payer: Medicare Other | Attending: Internal Medicine

## 2023-10-15 ENCOUNTER — Ambulatory Visit (HOSPITAL_COMMUNITY)
Admission: RE | Admit: 2023-10-15 | Discharge: 2023-10-15 | Disposition: A | Discharge status: Home / Self Care | Payer: Medicare Other | Source: Ambulatory Visit | Attending: Physician Assistant | Admitting: Physician Assistant

## 2023-10-15 DIAGNOSIS — C3412 Malignant neoplasm of upper lobe, left bronchus or lung: Secondary | ICD-10-CM

## 2023-10-15 DIAGNOSIS — M549 Dorsalgia, unspecified: Secondary | ICD-10-CM | POA: Diagnosis not present

## 2023-10-15 DIAGNOSIS — Z87891 Personal history of nicotine dependence: Secondary | ICD-10-CM | POA: Diagnosis not present

## 2023-10-15 DIAGNOSIS — Z85118 Personal history of other malignant neoplasm of bronchus and lung: Secondary | ICD-10-CM | POA: Diagnosis present

## 2023-10-15 DIAGNOSIS — Z79899 Other long term (current) drug therapy: Secondary | ICD-10-CM | POA: Diagnosis not present

## 2023-10-15 DIAGNOSIS — I272 Pulmonary hypertension, unspecified: Secondary | ICD-10-CM | POA: Insufficient documentation

## 2023-10-15 DIAGNOSIS — R5383 Other fatigue: Secondary | ICD-10-CM | POA: Diagnosis not present

## 2023-10-15 DIAGNOSIS — Z79891 Long term (current) use of opiate analgesic: Secondary | ICD-10-CM | POA: Diagnosis not present

## 2023-10-15 DIAGNOSIS — J431 Panlobular emphysema: Secondary | ICD-10-CM | POA: Diagnosis not present

## 2023-10-15 DIAGNOSIS — Z9981 Dependence on supplemental oxygen: Secondary | ICD-10-CM | POA: Diagnosis not present

## 2023-10-15 DIAGNOSIS — I3139 Other pericardial effusion (noninflammatory): Secondary | ICD-10-CM | POA: Diagnosis not present

## 2023-10-15 DIAGNOSIS — Z803 Family history of malignant neoplasm of breast: Secondary | ICD-10-CM | POA: Diagnosis not present

## 2023-10-15 DIAGNOSIS — C3431 Malignant neoplasm of lower lobe, right bronchus or lung: Secondary | ICD-10-CM

## 2023-10-15 DIAGNOSIS — R0602 Shortness of breath: Secondary | ICD-10-CM | POA: Diagnosis not present

## 2023-10-15 DIAGNOSIS — Z801 Family history of malignant neoplasm of trachea, bronchus and lung: Secondary | ICD-10-CM | POA: Insufficient documentation

## 2023-10-15 DIAGNOSIS — K5900 Constipation, unspecified: Secondary | ICD-10-CM | POA: Insufficient documentation

## 2023-10-15 LAB — CBC WITH DIFFERENTIAL (CANCER CENTER ONLY)
Abs Immature Granulocytes: 0.43 10*3/uL — ABNORMAL HIGH (ref 0.00–0.07)
Basophils Absolute: 0.1 10*3/uL (ref 0.0–0.1)
Basophils Relative: 1 %
Eosinophils Absolute: 0.2 10*3/uL (ref 0.0–0.5)
Eosinophils Relative: 2 %
HCT: 43.9 % (ref 36.0–46.0)
Hemoglobin: 14.1 g/dL (ref 12.0–15.0)
Immature Granulocytes: 4 %
Lymphocytes Relative: 12 %
Lymphs Abs: 1.3 10*3/uL (ref 0.7–4.0)
MCH: 30 pg (ref 26.0–34.0)
MCHC: 32.1 g/dL (ref 30.0–36.0)
MCV: 93.4 fL (ref 80.0–100.0)
Monocytes Absolute: 1.8 10*3/uL — ABNORMAL HIGH (ref 0.1–1.0)
Monocytes Relative: 17 %
Neutro Abs: 6.7 10*3/uL (ref 1.7–7.7)
Neutrophils Relative %: 64 %
Platelet Count: 372 10*3/uL (ref 150–400)
RBC: 4.7 MIL/uL (ref 3.87–5.11)
RDW: 15.9 % — ABNORMAL HIGH (ref 11.5–15.5)
WBC Count: 10.5 10*3/uL (ref 4.0–10.5)
nRBC: 0 % (ref 0.0–0.2)

## 2023-10-15 LAB — CMP (CANCER CENTER ONLY)
ALT: 15 U/L (ref 0–44)
AST: 20 U/L (ref 15–41)
Albumin: 3.8 g/dL (ref 3.5–5.0)
Alkaline Phosphatase: 67 U/L (ref 38–126)
Anion gap: 7 (ref 5–15)
BUN: 18 mg/dL (ref 8–23)
CO2: 31 mmol/L (ref 22–32)
Calcium: 10.2 mg/dL (ref 8.9–10.3)
Chloride: 98 mmol/L (ref 98–111)
Creatinine: 0.68 mg/dL (ref 0.44–1.00)
GFR, Estimated: >60 mL/min (ref >60)
Glucose, Bld: 101 mg/dL — ABNORMAL HIGH (ref 70–99)
Potassium: 4.3 mmol/L (ref 3.5–5.1)
Sodium: 136 mmol/L (ref 135–145)
Total Bilirubin: 0.4 mg/dL (ref 0.0–1.2)
Total Protein: 7.2 g/dL (ref 6.5–8.1)

## 2023-10-15 MED ORDER — IOHEXOL 300 MG/ML  SOLN
75.0000 mL | Freq: Once | INTRAMUSCULAR | Status: AC | PRN
  Administered 2023-10-15: 75 mL via INTRAVENOUS
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
## 2023-10-20 ENCOUNTER — Telehealth: Payer: Self-pay | Admitting: Medical Oncology

## 2023-10-20 NOTE — Telephone Encounter (Signed)
 Pt Jenny White -she is sick and asked to change her appt for tomorrow to video visit .Done-pt notified.

## 2023-10-22 ENCOUNTER — Inpatient Hospital Stay: Payer: Medicare Other | Admitting: Physician Assistant
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
## 2023-10-23 ENCOUNTER — Inpatient Hospital Stay (HOSPITAL_BASED_OUTPATIENT_CLINIC_OR_DEPARTMENT_OTHER): Payer: Medicare Other | Admitting: Physician Assistant

## 2023-10-23 DIAGNOSIS — C3412 Malignant neoplasm of upper lobe, left bronchus or lung: Secondary | ICD-10-CM | POA: Diagnosis not present

## 2023-10-23 DIAGNOSIS — C3431 Malignant neoplasm of lower lobe, right bronchus or lung: Secondary | ICD-10-CM

## 2023-10-30 ENCOUNTER — Telehealth: Payer: Self-pay | Admitting: Internal Medicine

## 2023-10-30 NOTE — Telephone Encounter (Signed)
[facility] Clinical Research Coordinator note:   This visit for Subject Jenny White with DOB: 14-Jun-1946 on 10/30/2023. Subject contacted regarding [id number] to inform that Principal Investigator has changed to Dr. Delton Coombes. Voicemail left requesting a return call.

## 2023-11-11 ENCOUNTER — Encounter (HOSPITAL_COMMUNITY): Payer: Self-pay | Admitting: General Surgery

## 2023-11-11 ENCOUNTER — Inpatient Hospital Stay (HOSPITAL_COMMUNITY)
Admission: EM | Admit: 2023-11-11 | Discharge: 2023-12-11 | DRG: 003 | Disposition: A | Discharge status: Other Acute Inpatient Hospital | Attending: Pulmonary Disease | Admitting: Pulmonary Disease

## 2023-11-11 ENCOUNTER — Emergency Department (HOSPITAL_COMMUNITY): Admitting: Registered Nurse

## 2023-11-11 ENCOUNTER — Other Ambulatory Visit: Payer: Self-pay

## 2023-11-11 ENCOUNTER — Inpatient Hospital Stay (HOSPITAL_COMMUNITY)

## 2023-11-11 ENCOUNTER — Encounter (HOSPITAL_COMMUNITY)
Admission: EM | Disposition: A | Discharge status: Nursing Home (Includes ICF) | Payer: Self-pay | Source: Home / Self Care | Attending: Pulmonary Disease

## 2023-11-11 ENCOUNTER — Emergency Department (HOSPITAL_COMMUNITY)

## 2023-11-11 DIAGNOSIS — Z885 Allergy status to narcotic agent status: Secondary | ICD-10-CM

## 2023-11-11 DIAGNOSIS — E871 Hypo-osmolality and hyponatremia: Secondary | ICD-10-CM | POA: Diagnosis present

## 2023-11-11 DIAGNOSIS — Z681 Body mass index (BMI) 19 or less, adult: Secondary | ICD-10-CM

## 2023-11-11 DIAGNOSIS — K219 Gastro-esophageal reflux disease without esophagitis: Secondary | ICD-10-CM | POA: Diagnosis not present

## 2023-11-11 DIAGNOSIS — E870 Hyperosmolality and hypernatremia: Secondary | ICD-10-CM | POA: Diagnosis present

## 2023-11-11 DIAGNOSIS — G9341 Metabolic encephalopathy: Secondary | ICD-10-CM | POA: Diagnosis not present

## 2023-11-11 DIAGNOSIS — G8929 Other chronic pain: Secondary | ICD-10-CM | POA: Diagnosis present

## 2023-11-11 DIAGNOSIS — G4733 Obstructive sleep apnea (adult) (pediatric): Secondary | ICD-10-CM | POA: Diagnosis not present

## 2023-11-11 DIAGNOSIS — N179 Acute kidney failure, unspecified: Secondary | ICD-10-CM | POA: Insufficient documentation

## 2023-11-11 DIAGNOSIS — J9601 Acute respiratory failure with hypoxia: Secondary | ICD-10-CM

## 2023-11-11 DIAGNOSIS — A419 Sepsis, unspecified organism: Principal | ICD-10-CM | POA: Insufficient documentation

## 2023-11-11 DIAGNOSIS — J9621 Acute and chronic respiratory failure with hypoxia: Secondary | ICD-10-CM | POA: Diagnosis not present

## 2023-11-11 DIAGNOSIS — D733 Abscess of spleen: Secondary | ICD-10-CM | POA: Diagnosis present

## 2023-11-11 DIAGNOSIS — Z9889 Other specified postprocedural states: Principal | ICD-10-CM

## 2023-11-11 DIAGNOSIS — R6521 Severe sepsis with septic shock: Secondary | ICD-10-CM | POA: Diagnosis present

## 2023-11-11 DIAGNOSIS — L89622 Pressure ulcer of left heel, stage 2: Secondary | ICD-10-CM | POA: Diagnosis not present

## 2023-11-11 DIAGNOSIS — J9622 Acute and chronic respiratory failure with hypercapnia: Secondary | ICD-10-CM | POA: Diagnosis not present

## 2023-11-11 DIAGNOSIS — F419 Anxiety disorder, unspecified: Secondary | ICD-10-CM | POA: Diagnosis present

## 2023-11-11 DIAGNOSIS — J449 Chronic obstructive pulmonary disease, unspecified: Secondary | ICD-10-CM | POA: Diagnosis present

## 2023-11-11 DIAGNOSIS — J9611 Chronic respiratory failure with hypoxia: Secondary | ICD-10-CM

## 2023-11-11 DIAGNOSIS — K55041 Focal (segmental) acute infarction of large intestine: Secondary | ICD-10-CM | POA: Diagnosis present

## 2023-11-11 DIAGNOSIS — R609 Edema, unspecified: Secondary | ICD-10-CM | POA: Diagnosis not present

## 2023-11-11 DIAGNOSIS — E119 Type 2 diabetes mellitus without complications: Secondary | ICD-10-CM | POA: Diagnosis present

## 2023-11-11 DIAGNOSIS — B9689 Other specified bacterial agents as the cause of diseases classified elsewhere: Secondary | ICD-10-CM | POA: Diagnosis not present

## 2023-11-11 DIAGNOSIS — N170 Acute kidney failure with tubular necrosis: Secondary | ICD-10-CM | POA: Diagnosis not present

## 2023-11-11 DIAGNOSIS — J690 Pneumonitis due to inhalation of food and vomit: Secondary | ICD-10-CM | POA: Insufficient documentation

## 2023-11-11 DIAGNOSIS — J431 Panlobular emphysema: Secondary | ICD-10-CM | POA: Diagnosis present

## 2023-11-11 DIAGNOSIS — Z79899 Other long term (current) drug therapy: Secondary | ICD-10-CM

## 2023-11-11 DIAGNOSIS — Z923 Personal history of irradiation: Secondary | ICD-10-CM

## 2023-11-11 DIAGNOSIS — I10 Essential (primary) hypertension: Secondary | ICD-10-CM

## 2023-11-11 DIAGNOSIS — E875 Hyperkalemia: Secondary | ICD-10-CM | POA: Diagnosis present

## 2023-11-11 DIAGNOSIS — L89612 Pressure ulcer of right heel, stage 2: Secondary | ICD-10-CM | POA: Diagnosis not present

## 2023-11-11 DIAGNOSIS — R9431 Abnormal electrocardiogram [ECG] [EKG]: Secondary | ICD-10-CM | POA: Diagnosis not present

## 2023-11-11 DIAGNOSIS — T17890A Other foreign object in other parts of respiratory tract causing asphyxiation, initial encounter: Secondary | ICD-10-CM | POA: Diagnosis not present

## 2023-11-11 DIAGNOSIS — J440 Chronic obstructive pulmonary disease with acute lower respiratory infection: Secondary | ICD-10-CM | POA: Diagnosis not present

## 2023-11-11 DIAGNOSIS — L8990 Pressure ulcer of unspecified site, unspecified stage: Secondary | ICD-10-CM | POA: Insufficient documentation

## 2023-11-11 DIAGNOSIS — I482 Chronic atrial fibrillation, unspecified: Secondary | ICD-10-CM | POA: Diagnosis not present

## 2023-11-11 DIAGNOSIS — J441 Chronic obstructive pulmonary disease with (acute) exacerbation: Secondary | ICD-10-CM | POA: Diagnosis not present

## 2023-11-11 DIAGNOSIS — G9340 Encephalopathy, unspecified: Secondary | ICD-10-CM | POA: Diagnosis not present

## 2023-11-11 DIAGNOSIS — R579 Shock, unspecified: Secondary | ICD-10-CM

## 2023-11-11 DIAGNOSIS — E8721 Acute metabolic acidosis: Secondary | ICD-10-CM | POA: Diagnosis present

## 2023-11-11 DIAGNOSIS — J9620 Acute and chronic respiratory failure, unspecified whether with hypoxia or hypercapnia: Secondary | ICD-10-CM | POA: Diagnosis not present

## 2023-11-11 DIAGNOSIS — M479 Spondylosis, unspecified: Secondary | ICD-10-CM | POA: Diagnosis present

## 2023-11-11 DIAGNOSIS — K56609 Unspecified intestinal obstruction, unspecified as to partial versus complete obstruction: Secondary | ICD-10-CM | POA: Diagnosis not present

## 2023-11-11 DIAGNOSIS — E46 Unspecified protein-calorie malnutrition: Secondary | ICD-10-CM | POA: Diagnosis not present

## 2023-11-11 DIAGNOSIS — J9602 Acute respiratory failure with hypercapnia: Secondary | ICD-10-CM | POA: Diagnosis not present

## 2023-11-11 DIAGNOSIS — E876 Hypokalemia: Secondary | ICD-10-CM | POA: Diagnosis present

## 2023-11-11 DIAGNOSIS — Z803 Family history of malignant neoplasm of breast: Secondary | ICD-10-CM

## 2023-11-11 DIAGNOSIS — Z87891 Personal history of nicotine dependence: Secondary | ICD-10-CM

## 2023-11-11 DIAGNOSIS — Z930 Tracheostomy status: Secondary | ICD-10-CM | POA: Diagnosis not present

## 2023-11-11 DIAGNOSIS — E861 Hypovolemia: Secondary | ICD-10-CM | POA: Diagnosis not present

## 2023-11-11 DIAGNOSIS — E43 Unspecified severe protein-calorie malnutrition: Secondary | ICD-10-CM | POA: Insufficient documentation

## 2023-11-11 DIAGNOSIS — I4891 Unspecified atrial fibrillation: Secondary | ICD-10-CM | POA: Diagnosis not present

## 2023-11-11 DIAGNOSIS — Z9049 Acquired absence of other specified parts of digestive tract: Secondary | ICD-10-CM

## 2023-11-11 DIAGNOSIS — K56600 Partial intestinal obstruction, unspecified as to cause: Secondary | ICD-10-CM | POA: Diagnosis not present

## 2023-11-11 DIAGNOSIS — E872 Acidosis, unspecified: Secondary | ICD-10-CM

## 2023-11-11 DIAGNOSIS — K650 Generalized (acute) peritonitis: Secondary | ICD-10-CM | POA: Diagnosis present

## 2023-11-11 DIAGNOSIS — R1011 Right upper quadrant pain: Secondary | ICD-10-CM | POA: Diagnosis present

## 2023-11-11 DIAGNOSIS — M1990 Unspecified osteoarthritis, unspecified site: Secondary | ICD-10-CM | POA: Diagnosis present

## 2023-11-11 DIAGNOSIS — J810 Acute pulmonary edema: Secondary | ICD-10-CM | POA: Diagnosis not present

## 2023-11-11 DIAGNOSIS — Z7951 Long term (current) use of inhaled steroids: Secondary | ICD-10-CM

## 2023-11-11 DIAGNOSIS — Z9981 Dependence on supplemental oxygen: Secondary | ICD-10-CM

## 2023-11-11 DIAGNOSIS — J189 Pneumonia, unspecified organism: Secondary | ICD-10-CM | POA: Diagnosis not present

## 2023-11-11 DIAGNOSIS — K659 Peritonitis, unspecified: Secondary | ICD-10-CM | POA: Diagnosis not present

## 2023-11-11 DIAGNOSIS — Z85118 Personal history of other malignant neoplasm of bronchus and lung: Secondary | ICD-10-CM

## 2023-11-11 DIAGNOSIS — I5031 Acute diastolic (congestive) heart failure: Secondary | ICD-10-CM | POA: Diagnosis not present

## 2023-11-11 DIAGNOSIS — K565 Intestinal adhesions [bands], unspecified as to partial versus complete obstruction: Secondary | ICD-10-CM | POA: Diagnosis present

## 2023-11-11 DIAGNOSIS — I480 Paroxysmal atrial fibrillation: Secondary | ICD-10-CM | POA: Insufficient documentation

## 2023-11-11 DIAGNOSIS — K567 Ileus, unspecified: Secondary | ICD-10-CM | POA: Diagnosis not present

## 2023-11-11 DIAGNOSIS — R54 Age-related physical debility: Secondary | ICD-10-CM | POA: Diagnosis present

## 2023-11-11 DIAGNOSIS — I959 Hypotension, unspecified: Secondary | ICD-10-CM | POA: Diagnosis not present

## 2023-11-11 DIAGNOSIS — Z781 Physical restraint status: Secondary | ICD-10-CM

## 2023-11-11 DIAGNOSIS — Z9221 Personal history of antineoplastic chemotherapy: Secondary | ICD-10-CM

## 2023-11-11 DIAGNOSIS — Z7982 Long term (current) use of aspirin: Secondary | ICD-10-CM

## 2023-11-11 DIAGNOSIS — R569 Unspecified convulsions: Secondary | ICD-10-CM | POA: Diagnosis not present

## 2023-11-11 DIAGNOSIS — L89156 Pressure-induced deep tissue damage of sacral region: Secondary | ICD-10-CM | POA: Diagnosis not present

## 2023-11-11 DIAGNOSIS — R4182 Altered mental status, unspecified: Secondary | ICD-10-CM | POA: Diagnosis not present

## 2023-11-11 DIAGNOSIS — J151 Pneumonia due to Pseudomonas: Secondary | ICD-10-CM | POA: Diagnosis not present

## 2023-11-11 DIAGNOSIS — D649 Anemia, unspecified: Secondary | ICD-10-CM | POA: Diagnosis not present

## 2023-11-11 LAB — POCT I-STAT 7, (LYTES, BLD GAS, ICA,H+H)
Acid-base deficit: 4 mmol/L — ABNORMAL HIGH (ref 0.0–2.0)
Acid-base deficit: 5 mmol/L — ABNORMAL HIGH (ref 0.0–2.0)
Bicarbonate: 23.8 mmol/L (ref 20.0–28.0)
Bicarbonate: 21 mmol/L (ref 20.0–28.0)
Calcium, Ion: 1.23 mmol/L (ref 1.15–1.40)
Calcium, Ion: 1.24 mmol/L (ref 1.15–1.40)
HCT: 40 % (ref 36.0–46.0)
HCT: 39 % (ref 36.0–46.0)
Hemoglobin: 13.6 g/dL (ref 12.0–15.0)
Hemoglobin: 13.3 g/dL (ref 12.0–15.0)
O2 Saturation: 100 %
O2 Saturation: 100 %
Patient temperature: 34.5
Patient temperature: 34.5
Potassium: 3.9 mmol/L (ref 3.5–5.1)
Potassium: 3.4 mmol/L — ABNORMAL LOW (ref 3.5–5.1)
Sodium: 139 mmol/L (ref 135–145)
Sodium: 139 mmol/L (ref 135–145)
TCO2: 25 mmol/L (ref 22–32)
TCO2: 22 mmol/L (ref 22–32)
pCO2 arterial: 47.8 mmHg (ref 32–48)
pCO2 arterial: 39.4 mmHg (ref 32–48)
pH, Arterial: 7.293 — ABNORMAL LOW (ref 7.35–7.45)
pH, Arterial: 7.323 — ABNORMAL LOW (ref 7.35–7.45)
pO2, Arterial: 469 mmHg — ABNORMAL HIGH (ref 83–108)
pO2, Arterial: 211 mmHg — ABNORMAL HIGH (ref 83–108)

## 2023-11-11 LAB — COMPREHENSIVE METABOLIC PANEL
ALT: 35 U/L (ref 0–44)
AST: 46 U/L — ABNORMAL HIGH (ref 15–41)
Albumin: 3.7 g/dL (ref 3.5–5.0)
Alkaline Phosphatase: 125 U/L (ref 38–126)
Anion gap: 20 — ABNORMAL HIGH (ref 5–15)
BUN: 29 mg/dL — ABNORMAL HIGH (ref 8–23)
CO2: 19 mmol/L — ABNORMAL LOW (ref 22–32)
Calcium: 11.2 mg/dL — ABNORMAL HIGH (ref 8.9–10.3)
Chloride: 100 mmol/L (ref 98–111)
Creatinine, Ser: 0.96 mg/dL (ref 0.44–1.00)
GFR, Estimated: >60 mL/min (ref >60)
Glucose, Bld: 198 mg/dL — ABNORMAL HIGH (ref 70–99)
Potassium: 4 mmol/L (ref 3.5–5.1)
Sodium: 139 mmol/L (ref 135–145)
Total Bilirubin: 0.8 mg/dL (ref 0.0–1.2)
Total Protein: 7.4 g/dL (ref 6.5–8.1)

## 2023-11-11 LAB — URINALYSIS, ROUTINE W REFLEX MICROSCOPIC
Bilirubin Urine: NEGATIVE
Glucose, UA: NEGATIVE mg/dL
Hgb urine dipstick: NEGATIVE
Ketones, ur: 5 mg/dL — AB
Leukocytes,Ua: NEGATIVE
Nitrite: NEGATIVE
Protein, ur: NEGATIVE mg/dL
Specific Gravity, Urine: 1.027 (ref 1.005–1.030)
pH: 5 (ref 5.0–8.0)

## 2023-11-11 LAB — GLUCOSE, CAPILLARY
Glucose-Capillary: 116 mg/dL — ABNORMAL HIGH (ref 70–99)
Glucose-Capillary: 134 mg/dL — ABNORMAL HIGH (ref 70–99)
Glucose-Capillary: 88 mg/dL (ref 70–99)

## 2023-11-11 LAB — I-STAT VENOUS BLOOD GAS, ED
Acid-base deficit: 5 mmol/L — ABNORMAL HIGH (ref 0.0–2.0)
Bicarbonate: 23.6 mmol/L (ref 20.0–28.0)
Calcium, Ion: 1.22 mmol/L (ref 1.15–1.40)
HCT: 48 % — ABNORMAL HIGH (ref 36.0–46.0)
Hemoglobin: 16.3 g/dL — ABNORMAL HIGH (ref 12.0–15.0)
O2 Saturation: 43 %
Potassium: 3.6 mmol/L (ref 3.5–5.1)
Sodium: 138 mmol/L (ref 135–145)
TCO2: 25 mmol/L (ref 22–32)
pCO2, Ven: 56.6 mmHg (ref 44–60)
pH, Ven: 7.228 — ABNORMAL LOW (ref 7.25–7.43)
pO2, Ven: 29 mmHg — CL (ref 32–45)

## 2023-11-11 LAB — CBC
HCT: 52.5 % — ABNORMAL HIGH (ref 36.0–46.0)
HCT: 40.7 % (ref 36.0–46.0)
Hemoglobin: 16.2 g/dL — ABNORMAL HIGH (ref 12.0–15.0)
Hemoglobin: 12.9 g/dL (ref 12.0–15.0)
MCH: 30.2 pg (ref 26.0–34.0)
MCH: 29.9 pg (ref 26.0–34.0)
MCHC: 30.9 g/dL (ref 30.0–36.0)
MCHC: 31.7 g/dL (ref 30.0–36.0)
MCV: 97.9 fL (ref 80.0–100.0)
MCV: 94.4 fL (ref 80.0–100.0)
Platelets: 313 10*3/uL (ref 150–400)
Platelets: 225 10*3/uL (ref 150–400)
RBC: 5.36 MIL/uL — ABNORMAL HIGH (ref 3.87–5.11)
RBC: 4.31 MIL/uL (ref 3.87–5.11)
RDW: 16.5 % — ABNORMAL HIGH (ref 11.5–15.5)
RDW: 16.5 % — ABNORMAL HIGH (ref 11.5–15.5)
WBC: 11.1 10*3/uL — ABNORMAL HIGH (ref 4.0–10.5)
WBC: 5.3 10*3/uL (ref 4.0–10.5)
nRBC: 0 % (ref 0.0–0.2)
nRBC: 0 % (ref 0.0–0.2)

## 2023-11-11 LAB — BASIC METABOLIC PANEL
Anion gap: 10 (ref 5–15)
BUN: 34 mg/dL — ABNORMAL HIGH (ref 8–23)
CO2: 19 mmol/L — ABNORMAL LOW (ref 22–32)
Calcium: 8.4 mg/dL — ABNORMAL LOW (ref 8.9–10.3)
Chloride: 108 mmol/L (ref 98–111)
Creatinine, Ser: 0.92 mg/dL (ref 0.44–1.00)
GFR, Estimated: >60 mL/min (ref >60)
Glucose, Bld: 114 mg/dL — ABNORMAL HIGH (ref 70–99)
Potassium: 3.5 mmol/L (ref 3.5–5.1)
Sodium: 137 mmol/L (ref 135–145)

## 2023-11-11 LAB — I-STAT CHEM 8, ED
BUN: 51 mg/dL — ABNORMAL HIGH (ref 8–23)
Calcium, Ion: 1.05 mmol/L — ABNORMAL LOW (ref 1.15–1.40)
Chloride: 110 mmol/L (ref 98–111)
Creatinine, Ser: 0.8 mg/dL (ref 0.44–1.00)
Glucose, Bld: 188 mg/dL — ABNORMAL HIGH (ref 70–99)
HCT: 52 % — ABNORMAL HIGH (ref 36.0–46.0)
Hemoglobin: 17.7 g/dL — ABNORMAL HIGH (ref 12.0–15.0)
Potassium: 4.6 mmol/L (ref 3.5–5.1)
Sodium: 135 mmol/L (ref 135–145)
TCO2: 21 mmol/L — ABNORMAL LOW (ref 22–32)

## 2023-11-11 LAB — LACTIC ACID, PLASMA: Lactic Acid, Venous: 1.8 mmol/L (ref 0.5–1.9)

## 2023-11-11 LAB — I-STAT CG4 LACTIC ACID, ED
Lactic Acid, Venous: 4.2 mmol/L (ref 0.5–1.9)
Lactic Acid, Venous: 2.5 mmol/L (ref 0.5–1.9)

## 2023-11-11 LAB — TYPE AND SCREEN
ABO/RH(D): AB POS
Antibody Screen: NEGATIVE

## 2023-11-11 LAB — MRSA NEXT GEN BY PCR, NASAL: MRSA by PCR Next Gen: NOT DETECTED

## 2023-11-11 LAB — MAGNESIUM: Magnesium: 1.7 mg/dL (ref 1.7–2.4)

## 2023-11-11 LAB — LIPASE, BLOOD: Lipase: 44 U/L (ref 11–51)

## 2023-11-11 SURGERY — LAPAROTOMY, EXPLORATORY
Anesthesia: General | Site: Abdomen

## 2023-11-11 MED ORDER — IOHEXOL 350 MG/ML SOLN
100.0000 mL | Freq: Once | INTRAVENOUS | Status: AC | PRN
  Administered 2023-11-11: 100 mL via INTRAVENOUS

## 2023-11-11 MED ORDER — ONDANSETRON HCL 4 MG/2ML IJ SOLN
4.0000 mg | Freq: Once | INTRAMUSCULAR | Status: AC
  Administered 2023-11-11: 4 mg via INTRAVENOUS
  Filled 2023-11-11: qty 2

## 2023-11-11 MED ORDER — PHENYLEPHRINE HCL-NACL 20-0.9 MG/250ML-% IV SOLN
INTRAVENOUS | Status: DC | PRN
  Administered 2023-11-11: 40 ug/min via INTRAVENOUS

## 2023-11-11 MED ORDER — ONDANSETRON HCL 4 MG/2ML IJ SOLN
INTRAMUSCULAR | Status: AC
Start: 2023-11-11 — End: 2023-11-11
  Administered 2023-11-11: 4 mg via INTRAVENOUS
  Filled 2023-11-11: qty 2

## 2023-11-11 MED ORDER — LACTATED RINGERS IV BOLUS
1000.0000 mL | Freq: Once | INTRAVENOUS | Status: AC
  Administered 2023-11-11: 1000 mL via INTRAVENOUS

## 2023-11-11 MED ORDER — FENTANYL CITRATE (PF) 250 MCG/5ML IJ SOLN
INTRAMUSCULAR | Status: AC
  Filled 2023-11-11: qty 5

## 2023-11-11 MED ORDER — OXYMETAZOLINE HCL 0.05 % NA SOLN
1.0000 | Freq: Once | NASAL | Status: AC
  Administered 2023-11-11: 1 via NASAL
  Filled 2023-11-11: qty 30

## 2023-11-11 MED ORDER — BENZOCAINE 20 % MT AERO
INHALATION_SPRAY | Freq: Once | OROMUCOSAL | Status: DC
  Filled 2023-11-11: qty 57

## 2023-11-11 MED ORDER — ROCURONIUM BROMIDE 10 MG/ML (PF) SYRINGE
PREFILLED_SYRINGE | INTRAVENOUS | Status: DC | PRN
  Administered 2023-11-11: 50 mg via INTRAVENOUS

## 2023-11-11 MED ORDER — PIPERACILLIN-TAZOBACTAM 3.375 G IVPB 30 MIN
3.3750 g | Freq: Once | INTRAVENOUS | Status: AC
  Administered 2023-11-11: 3.375 g via INTRAVENOUS
  Filled 2023-11-11: qty 50

## 2023-11-11 MED ORDER — CHLORHEXIDINE GLUCONATE 0.12 % MT SOLN
OROMUCOSAL | Status: AC
  Administered 2023-11-11: 15 mL via OROMUCOSAL
  Filled 2023-11-11: qty 15

## 2023-11-11 MED ORDER — ACETAMINOPHEN 10 MG/ML IV SOLN
1000.0000 mg | Freq: Four times a day (QID) | INTRAVENOUS | Status: AC
  Administered 2023-11-11 – 2023-11-12 (×4): 1000 mg via INTRAVENOUS
  Filled 2023-11-11 (×3): qty 100

## 2023-11-11 MED ORDER — LIDOCAINE 2% (20 MG/ML) 5 ML SYRINGE
INTRAMUSCULAR | Status: AC
  Filled 2023-11-11: qty 5

## 2023-11-11 MED ORDER — SUCCINYLCHOLINE CHLORIDE 200 MG/10ML IV SOSY
PREFILLED_SYRINGE | INTRAVENOUS | Status: DC | PRN
  Administered 2023-11-11: 100 mg via INTRAVENOUS

## 2023-11-11 MED ORDER — ALBUMIN HUMAN 5 % IV SOLN: INTRAVENOUS | Status: DC | PRN

## 2023-11-11 MED ORDER — POLYETHYLENE GLYCOL 3350 17 G PO PACK
17.0000 g | PACK | Freq: Every day | ORAL | Status: DC
Start: 2023-11-11 — End: 2023-11-11

## 2023-11-11 MED ORDER — DOCUSATE SODIUM 50 MG/5ML PO LIQD: 100.0000 mg | Freq: Two times a day (BID) | ORAL | Status: DC

## 2023-11-11 MED ORDER — PROPOFOL 1000 MG/100ML IV EMUL: 0.0000 ug/kg/min | INTRAVENOUS | Status: DC

## 2023-11-11 MED ORDER — PHENYLEPHRINE HCL-NACL 20-0.9 MG/250ML-% IV SOLN: 0.0000 ug/min | INTRAVENOUS | Status: DC

## 2023-11-11 MED ORDER — SUGAMMADEX SODIUM 200 MG/2ML IV SOLN
INTRAVENOUS | Status: AC
  Filled 2023-11-11: qty 2

## 2023-11-11 MED ORDER — PROPOFOL 10 MG/ML IV BOLUS
INTRAVENOUS | Status: AC
  Filled 2023-11-11: qty 20

## 2023-11-11 MED ORDER — SODIUM CHLORIDE 0.9 % IV SOLN: INTRAVENOUS | Status: DC

## 2023-11-11 MED ORDER — PHENYLEPHRINE HCL-NACL 20-0.9 MG/250ML-% IV SOLN
25.0000 ug/min | INTRAVENOUS | Status: DC
  Administered 2023-11-12: 50 ug/min via INTRAVENOUS
  Administered 2023-11-12: 65 ug/min via INTRAVENOUS
  Filled 2023-11-11 (×2): qty 250

## 2023-11-11 MED ORDER — FENTANYL CITRATE (PF) 100 MCG/2ML IJ SOLN
INTRAMUSCULAR | Status: AC
  Administered 2023-11-11: 50 ug via INTRAVENOUS
  Filled 2023-11-11: qty 2

## 2023-11-11 MED ORDER — DEXAMETHASONE SODIUM PHOSPHATE 10 MG/ML IJ SOLN
INTRAMUSCULAR | Status: DC | PRN
Start: 2023-11-11 — End: 2023-11-11
  Administered 2023-11-11: 10 mg via INTRAVENOUS

## 2023-11-11 MED ORDER — LIDOCAINE 2% (20 MG/ML) 5 ML SYRINGE
INTRAMUSCULAR | Status: DC | PRN
  Administered 2023-11-11: 60 mg via INTRAVENOUS

## 2023-11-11 MED ORDER — FENTANYL CITRATE PF 50 MCG/ML IJ SOSY
25.0000 ug | PREFILLED_SYRINGE | INTRAMUSCULAR | Status: AC | PRN
  Administered 2023-11-12 – 2023-11-13 (×3): 25 ug via INTRAVENOUS

## 2023-11-11 MED ORDER — ONDANSETRON HCL 4 MG/2ML IJ SOLN: 4.0000 mg | Freq: Once | INTRAMUSCULAR | Status: AC

## 2023-11-11 MED ORDER — PHENYLEPHRINE 80 MCG/ML (10ML) SYRINGE FOR IV PUSH (FOR BLOOD PRESSURE SUPPORT)
PREFILLED_SYRINGE | INTRAVENOUS | Status: DC | PRN
  Administered 2023-11-11 (×2): 160 ug via INTRAVENOUS

## 2023-11-11 MED ORDER — LACTATED RINGERS IV SOLN: INTRAVENOUS | Status: DC | PRN

## 2023-11-11 MED ORDER — DEXAMETHASONE SODIUM PHOSPHATE 10 MG/ML IJ SOLN
INTRAMUSCULAR | Status: AC
  Filled 2023-11-11: qty 1

## 2023-11-11 MED ORDER — FENTANYL CITRATE PF 50 MCG/ML IJ SOSY
25.0000 ug | PREFILLED_SYRINGE | Freq: Once | INTRAMUSCULAR | Status: AC
  Administered 2023-11-11: 25 ug via INTRAVENOUS
  Filled 2023-11-11: qty 1

## 2023-11-11 MED ORDER — PANTOPRAZOLE SODIUM 40 MG IV SOLR
40.0000 mg | Freq: Every day | INTRAVENOUS | Status: DC
  Administered 2023-11-11 – 2023-11-23 (×13): 40 mg via INTRAVENOUS
  Filled 2023-11-11 (×15): qty 10

## 2023-11-11 MED ORDER — VASOPRESSIN 20 UNIT/ML IV SOLN
INTRAVENOUS | Status: DC | PRN
Start: 2023-11-11 — End: 2023-11-11
  Administered 2023-11-11 (×2): 2 [IU] via INTRAVENOUS
  Administered 2023-11-11: 1 [IU] via INTRAVENOUS

## 2023-11-11 MED ORDER — FENTANYL CITRATE (PF) 250 MCG/5ML IJ SOLN
INTRAMUSCULAR | Status: DC | PRN
  Administered 2023-11-11: 50 ug via INTRAVENOUS

## 2023-11-11 MED ORDER — CHLORHEXIDINE GLUCONATE 0.12 % MT SOLN: 15.0000 mL | Freq: Once | OROMUCOSAL | Status: AC

## 2023-11-11 MED ORDER — FENTANYL CITRATE PF 50 MCG/ML IJ SOSY
50.0000 ug | PREFILLED_SYRINGE | Freq: Once | INTRAMUSCULAR | Status: AC
  Administered 2023-11-11: 50 ug via INTRAVENOUS
  Filled 2023-11-11 (×2): qty 1

## 2023-11-11 MED ORDER — NOREPINEPHRINE 4 MG/250ML-% IV SOLN
2.0000 ug/min | INTRAVENOUS | Status: DC
Start: 2023-11-11 — End: 2023-11-12
  Administered 2023-11-11: 2 ug/min via INTRAVENOUS
  Administered 2023-11-12 (×2): 10 ug/min via INTRAVENOUS
  Filled 2023-11-11 (×3): qty 250

## 2023-11-11 MED ORDER — LIDOCAINE HCL URETHRAL/MUCOSAL 2 % EX GEL
1.0000 | Freq: Once | CUTANEOUS | Status: AC
  Administered 2023-11-11: 1 via TOPICAL
  Filled 2023-11-11: qty 11

## 2023-11-11 MED ORDER — FENTANYL CITRATE (PF) 100 MCG/2ML IJ SOLN: 50.0000 ug | INTRAMUSCULAR | Status: AC

## 2023-11-11 MED ORDER — SODIUM CHLORIDE 0.9 % IV SOLN: 250.0000 mL | INTRAVENOUS | Status: AC

## 2023-11-11 MED ORDER — SODIUM CHLORIDE 0.9 % IV BOLUS
1000.0000 mL | Freq: Once | INTRAVENOUS | Status: AC
  Administered 2023-11-11: 1000 mL via INTRAVENOUS

## 2023-11-11 MED ORDER — PROPOFOL 10 MG/ML IV BOLUS
INTRAVENOUS | Status: DC | PRN
  Administered 2023-11-11: 40 ug/kg/min via INTRAVENOUS
  Administered 2023-11-11: 100 mg via INTRAVENOUS

## 2023-11-11 MED ORDER — 0.9 % SODIUM CHLORIDE (POUR BTL) OPTIME
TOPICAL | Status: DC | PRN
  Administered 2023-11-11: 2000 mL

## 2023-11-11 MED ORDER — ORAL CARE MOUTH RINSE: 15.0000 mL | Freq: Once | OROMUCOSAL | Status: AC

## 2023-11-11 MED ORDER — FENTANYL CITRATE PF 50 MCG/ML IJ SOSY
25.0000 ug | PREFILLED_SYRINGE | INTRAMUSCULAR | Status: DC | PRN
  Administered 2023-11-11: 50 ug via INTRAVENOUS
  Administered 2023-11-11: 25 ug via INTRAVENOUS
  Administered 2023-11-11 – 2023-11-12 (×2): 50 ug via INTRAVENOUS
  Administered 2023-11-13: 25 ug via INTRAVENOUS
  Administered 2023-11-14: 50 ug via INTRAVENOUS
  Administered 2023-11-14: 25 ug via INTRAVENOUS
  Administered 2023-11-14 – 2023-11-15 (×6): 50 ug via INTRAVENOUS
  Administered 2023-11-15: 25 ug via INTRAVENOUS
  Administered 2023-11-15 (×3): 50 ug via INTRAVENOUS
  Filled 2023-11-11 (×15): qty 1

## 2023-11-11 MED ORDER — ONDANSETRON HCL 4 MG/2ML IJ SOLN
INTRAMUSCULAR | Status: DC | PRN
  Administered 2023-11-11: 4 mg via INTRAVENOUS

## 2023-11-11 MED ORDER — NOREPINEPHRINE 4 MG/250ML-% IV SOLN
0.0000 ug/min | INTRAVENOUS | Status: DC
Start: 2023-11-11 — End: 2023-11-11

## 2023-11-11 MED ORDER — PIPERACILLIN-TAZOBACTAM 3.375 G IVPB
3.3750 g | Freq: Three times a day (TID) | INTRAVENOUS | Status: DC
  Administered 2023-11-11 – 2023-11-15 (×11): 3.375 g via INTRAVENOUS
  Filled 2023-11-11 (×11): qty 50

## 2023-11-11 MED ORDER — PROPOFOL 1000 MG/100ML IV EMUL
5.0000 ug/kg/min | INTRAVENOUS | Status: DC
  Administered 2023-11-11: 50 ug/kg/min via INTRAVENOUS
  Administered 2023-11-12 (×2): 40 ug/kg/min via INTRAVENOUS
  Administered 2023-11-13: 30 ug/kg/min via INTRAVENOUS
  Administered 2023-11-13: 15 ug/kg/min via INTRAVENOUS
  Administered 2023-11-13: 30 ug/kg/min via INTRAVENOUS
  Filled 2023-11-11 (×8): qty 100

## 2023-11-11 MED ORDER — ORAL CARE MOUTH RINSE
15.0000 mL | OROMUCOSAL | Status: DC
  Administered 2023-11-11 – 2023-11-18 (×84): 15 mL via OROMUCOSAL

## 2023-11-11 MED ORDER — ROCURONIUM BROMIDE 10 MG/ML (PF) SYRINGE
PREFILLED_SYRINGE | INTRAVENOUS | Status: AC
  Filled 2023-11-11: qty 10

## 2023-11-11 MED ORDER — HEPARIN SODIUM (PORCINE) 5000 UNIT/ML IJ SOLN
5000.0000 [IU] | Freq: Three times a day (TID) | INTRAMUSCULAR | Status: DC
  Administered 2023-11-12 – 2023-11-18 (×19): 5000 [IU] via SUBCUTANEOUS
  Filled 2023-11-11 (×19): qty 1

## 2023-11-11 MED ORDER — ONDANSETRON HCL 4 MG/2ML IJ SOLN
INTRAMUSCULAR | Status: AC
  Filled 2023-11-11: qty 2

## 2023-11-11 MED ORDER — DEXTROSE IN LACTATED RINGERS 5 % IV SOLN
INTRAVENOUS | Status: AC
  Filled 2023-11-11: qty 1000

## 2023-11-11 MED ORDER — ORAL CARE MOUTH RINSE: 15.0000 mL | OROMUCOSAL | Status: DC | PRN

## 2023-11-11 MED ORDER — LACTATED RINGERS IV SOLN: INTRAVENOUS | Status: DC

## 2023-11-11 SURGICAL SUPPLY — 42 items
BAG COUNTER SPONGE SURGICOUNT (BAG) ×2 IMPLANT
BLADE CLIPPER SURG (BLADE) IMPLANT
CANISTER SUCT 3000ML PPV (MISCELLANEOUS) ×2 IMPLANT
CANISTER WOUNDNEG PRESSURE 500 (CANNISTER) IMPLANT
CHLORAPREP W/TINT 26 (MISCELLANEOUS) ×2 IMPLANT
COVER SURGICAL LIGHT HANDLE (MISCELLANEOUS) ×2 IMPLANT
DRAPE LAPAROSCOPIC ABDOMINAL (DRAPES) ×2 IMPLANT
DRAPE WARM FLUID 44X44 (DRAPES) ×2 IMPLANT
DRSG OPSITE POSTOP 4X10 (GAUZE/BANDAGES/DRESSINGS) IMPLANT
DRSG OPSITE POSTOP 4X8 (GAUZE/BANDAGES/DRESSINGS) IMPLANT
ELECT BLADE 6.5 EXT (BLADE) IMPLANT
ELECT CAUTERY BLADE 6.4 (BLADE) IMPLANT
ELECT REM PT RETURN 9FT ADLT (ELECTROSURGICAL) ×2 IMPLANT
ELECTRODE REM PT RTRN 9FT ADLT (ELECTROSURGICAL) ×2 IMPLANT
GLOVE BIO SURGEON STRL SZ7 (GLOVE) ×2 IMPLANT
GLOVE BIOGEL PI IND STRL 7.5 (GLOVE) ×2 IMPLANT
GOWN STRL REUS W/ TWL LRG LVL3 (GOWN DISPOSABLE) ×4 IMPLANT
HANDLE SUCTION POOLE (INSTRUMENTS) ×2 IMPLANT
KIT BASIN OR (CUSTOM PROCEDURE TRAY) ×2 IMPLANT
KIT TURNOVER KIT B (KITS) ×2 IMPLANT
LIGASURE IMPACT 36 18CM CVD LR (INSTRUMENTS) IMPLANT
NS IRRIG 1000ML POUR BTL (IV SOLUTION) ×4 IMPLANT
PACK GENERAL/GYN (CUSTOM PROCEDURE TRAY) ×2 IMPLANT
PAD ARMBOARD 7.5X6 YLW CONV (MISCELLANEOUS) ×2 IMPLANT
PENCIL SMOKE EVACUATOR (MISCELLANEOUS) ×2 IMPLANT
RELOAD PROXIMATE 75MM BLUE (ENDOMECHANICALS) ×2 IMPLANT
RELOAD STAPLE 75 3.8 BLU REG (ENDOMECHANICALS) IMPLANT
SPECIMEN JAR LARGE (MISCELLANEOUS) IMPLANT
SPONGE ABD ABTHERA ADVANCE (MISCELLANEOUS) IMPLANT
SPONGE T-LAP 18X18 ~~LOC~~+RFID (SPONGE) IMPLANT
STAPLER PROXIMATE 75MM BLUE (STAPLE) IMPLANT
STAPLER VISISTAT 35W (STAPLE) ×2 IMPLANT
SUCTION POOLE HANDLE (INSTRUMENTS) ×2 IMPLANT
SUT PDS AB 1 TP1 96 (SUTURE) ×4 IMPLANT
SUT SILK 2 0 SH CR/8 (SUTURE) ×2 IMPLANT
SUT SILK 2-0 18XBRD TIE 12 (SUTURE) ×2 IMPLANT
SUT SILK 3 0 SH CR/8 (SUTURE) ×2 IMPLANT
SUT SILK 3-0 18XBRD TIE 12 (SUTURE) ×2 IMPLANT
SUT VIC AB 3-0 SH 27X BRD (SUTURE) IMPLANT
TOWEL GREEN STERILE (TOWEL DISPOSABLE) ×2 IMPLANT
TRAY FOLEY MTR SLVR 16FR STAT (SET/KITS/TRAYS/PACK) ×2 IMPLANT
YANKAUER SUCT BULB TIP NO VENT (SUCTIONS) IMPLANT

## 2023-11-11 NOTE — ED Notes (Signed)
 Gen surg Dr. Dwain Sarna at [facility]

## 2023-11-11 NOTE — ED Provider Triage Note (Signed)
 Emergency Medicine Provider Triage Evaluation Note  Jenny White , a 78 y.o. female  was evaluated in triage.  Pt complains of severe abdominal pain, nausea, vomiting, lightheadedness and fatigue.  Review of Systems  Positive: Abdominal distention, nausea, vomiting, no bowel movement, no flatus, diaphoresis, mottled skin, cool to the touch Negative: Chest pain, shortness of breath  Physical Exam  BP (!) 85/60   Pulse (!) 132   Temp 97.7 F (36.5 C) (Axillary)   Resp (!) 22   SpO2 95%  Gen:   Awake, sleepy, ill-appearing, diaphoretic, cool to the touch, mottled, clutching abdomen that is distended and hard Resp:  Normal effort, slightly increased work of breathing lungs clear,. MSK:   Moves extremities without difficulty, patient has mottled appearance with diaphoresis Other:  Extremely ill-appearing patient  Medical Decision Making  Medically screening exam initiated at 11:23 AM.  Appropriate orders placed.  Jenny White was informed that the remainder of the evaluation will be completed by another provider, this initial triage assessment does not replace that evaluation, and the importance of remaining in the ED until their evaluation is complete.  Jenny White is a 78 y.o. female with a past medical history significant for kidney stones, COPD, hypertension, GERD, emphysema, previous cholecystectomy, and sleep apnea who presents for severe abdominal pain.  According to patient, since yesterday she has been having worsening abdominal pain distention and nausea and vomiting.  She is feeling drowsy.  She denies any fevers or chills and denies any chest pain or shortness of breath.  Denies cough or URI symptoms.  Denies any urinary changes.  Denies trauma.  Reports she is having greater than 10 out of 10 pain.  Denies history of this.  Reports she has had constipation troubles over the years and has not had a bowel movement in about a week.  She denies history of bowel obstruction.  On exam,  patient is extremely ill-appearing.  Patient is pale, diaphoretic and mottled to the skin.  She is cool to the touch.  She is having extremely tender abdomen is distended.  I did not appreciate bowel sounds.  Lungs were clear on my exam and I did not appreciate a murmur initially.  Back nontender flanks nontender.    Clinically I am very concerned about significant bowel obstruction or other intra-abdominal pathology.   I quickly had them assess her blood pressure and it now was 85/60.  Will try to get her to a room soon as possible and will start fluids.  Will hold on pain medicine initially due to her hypotension but she would like any pain and nausea medicine.  I am concerned about something in her abdomen so we will order CT abdomen pelvis with contrast.  Will get acute abdominal series if possible first.  Anticipate admission.             Britzy Graul, Canary Brim, MD 11/11/23 [phone]

## 2023-11-11 NOTE — Anesthesia Procedure Notes (Addendum)
 Arterial Line Insertion Start/End3/12/2023 4:12 PM, 11/11/2023 4:16 PM Performed by: Beryle Lathe, MD, anesthesiologist  Patient location: OR. Preanesthetic checklist: patient identified, IV checked, monitors and equipment checked and pre-op evaluation Emergency situation Patient sedated Left, radial was placed Catheter size: 20 G Hand hygiene performed  and maximum sterile barriers used   Attempts: 1 Procedure performed using ultrasound guided technique. Ultrasound Notes:anatomy identified, needle tip was noted to be adjacent to the nerve/plexus identified, no ultrasound evidence of intravascular and/or intraneural injection and image(s) printed for medical record Following insertion, dressing applied. Post procedure assessment: normal and unchanged  Patient tolerated the procedure well with no immediate complications.

## 2023-11-11 NOTE — ED Provider Notes (Signed)
 [place] EMERGENCY DEPARTMENT AT [facility] Provider Note   CSN: [id number] Arrival date & time: 11/11/23  0935     History  Chief Complaint  Patient presents with   Abdominal Pain   Emesis   Nausea    Jenny White is a 78 y.o. female.  HPI     78yo female with history of hypertension, OSA, COPD, lung cancer who presents with concern for abdominal pain.    Abdominal pain began yesterday and worsening today and severe. Associated nausea and vomiting. Not sure of last BM-thinks 4 days. She doesn't think she has passed flatus, family believes she has.  Denies leg pain. No chest pain or dyspnea.   Past Medical History:  Diagnosis Date   Arthritis    in back   COPD (chronic obstructive pulmonary disease) (HCC)    Difficult airway    Due to limited oral opening. Elective glidescope used previously.   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 Authorizing Provider  albuterol (VENTOLIN HFA) 108 (90 Base) MCG/ACT inhaler Inhale 2 puffs into the lungs every 4 (four) hours as needed for wheezing or shortness of breath. 03/24/23   [provider]  albuterol (VENTOLIN HFA) 108 (90 Base) MCG/ACT inhaler Inhale 2 puffs into the lungs every 6 (six) hours as needed for wheezing or shortness of breath. 07/04/23   Viviano Simas, FNP  aspirin EC 81 MG tablet Take 81 mg by mouth every evening. Swallow whole.    [provider]  chlorpheniramine-HYDROcodone (TUSSIONEX) 10-8 MG/5ML Take 5 mLs by mouth at bedtime as needed for cough. 10/20/23   [provider]  Cholecalciferol (VITAMIN D) 50 MCG (2000 UT) tablet Take 2,000 Units by mouth  every evening.    [provider]  cyclobenzaprine (FLEXERIL) 5 MG tablet Take 5 mg by mouth 3 (three) times daily as needed for muscle spasms.    [provider]  dexamethasone (DECADRON) 4 MG tablet Take by mouth. 10/16/23 11/20/23  [provider]  docusate sodium (COLACE) 100 MG capsule Take 1 capsule (100 mg total) by mouth 2 (two) times daily. 06/21/22   Tressie Stalker, MD  famotidine (PEPCID) 40 MG tablet Take 40 mg by mouth at bedtime.    [provider]  gabapentin (NEURONTIN) 100 MG capsule Take 100-300 mg by mouth See admin instructions. Take 100 mg by mouth in the morning 100 mg at noon  and 300 mg by mouth at bedtime    [provider]  HYDROcodone-acetaminophen (NORCO/VICODIN) 5-325 MG tablet Take 0.5 tablets by mouth in the morning, at noon, in the evening, and at bedtime. 04/21/23   [provider]  ibuprofen (ADVIL) 200 MG tablet Take 600 mg by mouth 3 (three) times daily.    [provider]  lisinopril-hydrochlorothiazide (PRINZIDE,ZESTORETIC) 10-12.5 MG tablet Take 1 tablet by mouth in the morning.    [provider]  loratadine (CLARITIN) 10 MG tablet Take 10 mg by mouth every evening.    [provider]  LORazepam (ATIVAN) 0.5 MG tablet 1 tab po 30 minutes prior to radiation  planning or treatment 06/17/23   Ronny Bacon, PA-C  montelukast (SINGULAIR) 10 MG tablet Take 10 mg by mouth at bedtime.    [provider]  nicotine polacrilex (NICORETTE) 2 MG gum Take 2 mg by mouth as needed for smoking cessation.    [provider]  pantoprazole (PROTONIX) 20 MG tablet Take 20 mg by mouth 2 (two) times daily.    [provider]      Allergies    Demerol hcl [meperidine] and Percocet [oxycodone-acetaminophen]    Review of Systems   Review of Systems  Physical Exam Updated Vital Signs BP 103/60   Pulse 93   Temp 100.2 F (37.9 C)   Resp (!) 24   Ht 5\' 7"  (1.702 m)    Wt 54.4 kg   SpO2 95%   BMI 18.79 kg/m  Physical Exam Vitals and nursing note reviewed.  Constitutional:      General: She is in acute distress.     Appearance: She is well-developed. She is ill-appearing and toxic-appearing. She is not diaphoretic.  HENT:     Head: Normocephalic and atraumatic.  Eyes:     Conjunctiva/sclera: Conjunctivae normal.  Cardiovascular:     Rate and Rhythm: Normal rate and regular rhythm.     Heart sounds: Normal heart sounds. No murmur heard.    No friction rub. No gallop.  Pulmonary:     Effort: Pulmonary effort is normal. No respiratory distress.     Breath sounds: Normal breath sounds. No wheezing or rales.  Abdominal:     General: There is distension.     Tenderness: There is abdominal tenderness. There is guarding.  Musculoskeletal:        General: No tenderness.     Cervical back: Normal range of motion.  Skin:    General: Skin is warm and dry.     Findings: No erythema or rash.     Comments: Difficult to palpate bilateral LE pulses  Neurological:     Mental Status: She is alert and oriented to person, place, and time.     ED Results / Procedures / Treatments   Labs (all labs ordered are listed, but only abnormal results are displayed) Labs Reviewed  CBC - Abnormal; Notable for the following components:      Result Value   WBC 11.1 (*)    RBC 5.36 (*)    Hemoglobin 16.2 (*)    HCT 52.5 (*)    RDW 16.5 (*)    All other components within normal limits  URINALYSIS, ROUTINE W REFLEX MICROSCOPIC - Abnormal; Notable for the following components:   Color, Urine AMBER (*)    APPearance HAZY (*)    Ketones, ur 5 (*)    All other components within normal limits  COMPREHENSIVE METABOLIC PANEL - Abnormal; Notable for the following components:   CO2 19 (*)    Glucose, Bld 198 (*)    BUN 29 (*)    Calcium 11.2 (*)    AST 46 (*)    Anion gap 20 (*)    All other components within normal limits  BASIC METABOLIC PANEL - Abnormal; Notable  for the following components:   CO2 19 (*)    Glucose, Bld 114 (*)    BUN 34 (*)    Calcium 8.4 (*)    All other components within normal limits  CBC - Abnormal; Notable for the following components:   RDW 16.5 (*)    All other components within  normal limits  GLUCOSE, CAPILLARY - Abnormal; Notable for the following components:   Glucose-Capillary 116 (*)    All other components within normal limits  I-STAT CG4 LACTIC ACID, ED - Abnormal; Notable for the following components:   Lactic Acid, Venous 4.2 (*)    All other components within normal limits  I-STAT CHEM 8, ED - Abnormal; Notable for the following components:   BUN 51 (*)    Glucose, Bld 188 (*)    Calcium, Ion 1.05 (*)    TCO2 21 (*)    Hemoglobin 17.7 (*)    HCT 52.0 (*)    All other components within normal limits  I-STAT CG4 LACTIC ACID, ED - Abnormal; Notable for the following components:   Lactic Acid, Venous 2.5 (*)    All other components within normal limits  I-STAT VENOUS BLOOD GAS, ED - Abnormal; Notable for the following components:   pH, Ven 7.228 (*)    pO2, Ven 29 (*)    Acid-base deficit 5.0 (*)    HCT 48.0 (*)    Hemoglobin 16.3 (*)    All other components within normal limits  POCT I-STAT 7, (LYTES, BLD GAS, ICA,H+H) - Abnormal; Notable for the following components:   pH, Arterial 7.293 (*)    pO2, Arterial 469 (*)    Acid-base deficit 4.0 (*)    All other components within normal limits  POCT I-STAT 7, (LYTES, BLD GAS, ICA,H+H) - Abnormal; Notable for the following components:   pH, Arterial 7.323 (*)    pO2, Arterial 211 (*)    Acid-base deficit 5.0 (*)    Potassium 3.4 (*)    All other components within normal limits  MRSA NEXT GEN BY PCR, NASAL  CULTURE, BLOOD (ROUTINE X 2)  CULTURE, BLOOD (ROUTINE X 2)  LIPASE, BLOOD  LACTIC ACID, PLASMA  GLUCOSE, CAPILLARY  MAGNESIUM  BLOOD GAS, ARTERIAL  TRIGLYCERIDES  BLOOD GAS, ARTERIAL  CBC  TRIGLYCERIDES  MAGNESIUM  PHOSPHORUS  PROTIME-INR   COMPREHENSIVE METABOLIC PANEL  TYPE AND SCREEN  SURGICAL PATHOLOGY    EKG None  Radiology DG CHEST PORT 1 VIEW Result Date: 11/11/2023 CLINICAL DATA:  Intubated EXAM: PORTABLE CHEST 1 VIEW COMPARISON:  06/09/2023, CT 11/11/2023, PET CT 09/08/2023 FINDINGS: Endotracheal tube tip is about 4.4 cm superior to the carina. Enteric tube tip below the diaphragm but incompletely visualized. Emphysema. Vague density with fiducial marker in the left upper lobe. Known peripheral right lung base mass is evident as vague right basilar opacity. Normal cardiac size with aortic atherosclerosis. IMPRESSION: 1. Endotracheal tube tip about 4.4 cm superior to carina 2. Emphysema. Vague right lung base opacity corresponding to known mass/consolidation in the region. Similar vague left upper lobe opacity with fiducial marker Electronically Signed   By: Jasmine Pang M.D.   On: 11/11/2023 19:36   DG Abdomen 1 View Result Date: 11/11/2023 CLINICAL DATA:  Status post nasogastric tube placement. Emesis and nausea. Non-small-cell lung cancer. 11/11/2023 EXAM: ABDOMEN - 1 VIEW COMPARISON:  CT chest, abdomen, and pelvis 11/11/2023 FINDINGS: New enteric tube descends below the diaphragm and curls along the greater curvature of the stomach with the tip overlying the right upper quadrant, likely within the distal stomach. Right upper quadrant cholecystectomy clips. Contrast excretion by the bilateral kidneys from IV contrast administered for CT 1.5 hours earlier. Fluid-filled bowel loops as on prior CT. The dilated loops of bowel and CT are likely grossly similar to prior, with left lower quadrant bowel loop measuring up to  3.9 cm in caliber. Kyphoplasty cement is again seen within the L2 vertebral body. L3-4 bilateral transpedicular rod and screw fusion hardware with associated intervertebral disc spacer. IMPRESSION: 1. New enteric tube tip overlies the right upper quadrant, likely within the distal stomach. 2. Dilated fluid-filled  loops of bowel as on prior CT. Electronically Signed   By: Neita Garnet M.D.   On: 11/11/2023 15:58   CT Angio Chest/Abd/Pel for Dissection W and/or W/WO Result Date: 11/11/2023 CLINICAL DATA:  Non-small-cell lung cancer. Abdominal pain nausea vomiting. * Tracking Code: BO * EXAM: CT ANGIOGRAPHY CHEST, ABDOMEN AND PELVIS TECHNIQUE: Non-contrast CT of the chest was initially obtained. Multidetector CT imaging through the chest, abdomen and pelvis was performed using the standard protocol during bolus administration of intravenous contrast. Multiplanar reconstructed images and MIPs were obtained and reviewed to evaluate the vascular anatomy. RADIATION DOSE REDUCTION: This exam was performed according to the departmental dose-optimization program which includes automated exposure control, adjustment of the mA and/or kV according to patient size and/or use of iterative reconstruction technique. CONTRAST:  OMNIPAQUE IOHEXOL 350 MG/ML SOLN COMPARISON:  Chest CT with contrast 10/15/2023. PET-CT 09/08/2023. Older exams as well. FINDINGS: CTA CHEST FINDINGS Cardiovascular: Aortic root has a diameter of 3.2 cm. The ascending aorta at the level of the main pulmonary artery has a diameter of 3.7 x 3.4 cm. The descending thoracic aorta same level measures 2.6 x 2.5 cm. The distal aortic arch has a diameter approaching 3.0 cm. There is mild partially calcified atherosclerotic plaque. Some of the plaque along the descending thoracic aorta is irregular. Slight plaque also along the origin of the great vessels. No abnormal high density along the course of the thoracic aorta on the noncontrast dataset. No dissection or aneurysm formation. Coronary artery calcifications are seen. Tiny pericardial effusion. Heart is nonenlarged. Mediastinum/Nodes: Patulous fluid-filled esophagus. Slight wall thickening of the distal esophagus. There is an enlarged heterogeneous thyroid gland. This was hypermetabolic on PET-CT scan. Please  correlate with known history. No specific abnormal lymph node enlargement identified in the axillary regions, hilum or mediastinum. Lungs/Pleura: Emphysematous lung changes identified. Bilateral apical pleural thickening. There is some left upper lobe anterior scarring and bronchiectasis, unchanged from previous. Areas of bronchial wall thickening identified along lower lungs. No pneumothorax, effusion or edema. Areas of mucous plugging along the left lower lobe inferolateral is again noted as well. The focal ill-defined opacity along the dependent right lower lobe is again seen. Previously this was measured at 3.5 x 3.5 cm and today when measured in similar fashion measures 2.5 x 4.3 cm. Overall similar when adjusted for variances in technique. No new dominant lung lesion. Some tiny nodules identified as well such as left lower lobe anteriorly on series 7, image 117 which is unchanged measuring 4 mm. The central nodule left suprahilar with tissue marker is stable. Previous dimension of 7 mm and today on series 7, image 34 7 mm. Musculoskeletal: Osteopenia. Scattered degenerative changes. There are multiple compression deformities along the spine which are new including T6 and T12. The T12 level has some sclerosis. Please correlate with the time course of injury. Review of the MIP images confirms the above findings. CTA ABDOMEN AND PELVIS FINDINGS VASCULAR Aorta: Partially calcified atherosclerotic plaque. No dissection or aneurysm formation. Maximal dimension of the inferior abdominal aorta of 2.2 cm. Overall there is moderate calcified plaque. Celiac: Mild disease along the origin. There is also calcified plaque along a atretic splenic artery. 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 SMA:  Moderate atherosclerotic disease along the origin of the SMA with mild stenosis. More calcified plaque more distally along the SMA. Renals: Single bowel main renal arteries with some mild calcified plaque. IMA: Grossly patent. Inflow: There is  calcified moderate disease diffusely along the iliac vessels with only mild areas of stenosis suggested along the common and external iliacs. More moderate along the internal. Of note the common femoral and visualized femoral vessels have more significant disease. Veins: No obvious venous abnormality within the limitations of this arterial phase study. Review of the MIP images confirms the above findings. NON-VASCULAR Hepatobiliary: With the limits of the early phase of the arterial bolus, grossly the liver is preserved. Previous cholecystectomy with a dilated common duct but unchanged from prior. Pancreas: Unremarkable. No pancreatic ductal dilatation or surrounding inflammatory changes. 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 Please correlate with history. Adrenals/Urinary Tract: Stable thickening of the adrenal glands. Large upper pole left-sided Bosniak 1 renal cyst measuring 7 cm. A few smaller foci identified as well in each kidney, too small to characterize and poorly seen on this arterial phase only examination. No collecting system dilatation. The ureters have normal course and caliber extending down to the urinary bladder. The bladder is underdistended. Stomach/Bowel: Dilated fluid-filled stomach and small bowel diffusely with caliber change seen of the distal small bowel such as axial image 217 of series 6,. Please see coronal series 9, image 79 in the right lower quadrant. No pneumatosis, free air. Trace free fluid. The colon is relatively decompressed with scattered stool. There are some loops of small bowel extending anterior to the left side of the transverse colon. Evaluation of the bowel wall enhancement is somewhat more difficult as this is a true early arterial phase examination. Please correlate for any clinical evidence of ischemia. Again no pneumatosis. Lymphatic: No specific abnormal lymph node enlargement identified in the abdomen and pelvis. Reproductive: Uterus and bilateral adnexa  are unremarkable. Other: No free intra-air. Scattered mild free fluid. Slight mesenteric stranding. Mild mesenteric edema. Musculoskeletal: Degenerative changes seen of the spine and pelvis. Augmentation cement seen at L2. Fixation hardware posteriorly at L3-4. Associated streak artifact. Multilevel degenerative changes and stenosis. Critical Value/emergent results were called by telephone at the time of interpretation on 11/11/2023 at 12:40 pm to provider Alvira Monday, who verbally acknowledged these results. Review of the MIP images confirms the above findings. IMPRESSION: Scattered calcified as sclerotic plaque. Some areas of plaque are irregular. No dissection or aneurysm formation. There are areas of significant stenosis suggested particularly along the iliac vessels and femoral vessels at the edge of the imaging field. High-grade small-bowel obstruction is with transition point in the right lower quadrant along the distal ileum. There is some trace ascites and some mesenteric stranding. Evaluation the bowel wall in some areas is difficult due to the early phase of the arterial bolus. No pneumatosis. A component of bowel wall ischemia is difficult to completely exclude with this examination. Please correlate with clinical presentation. If needed short follow up could be considered. Distended esophagus with luminal fluid. This could relate to the bowel obstruction. Masslike parenchymal opacity again seen in the right lower lobe. Stable other small areas of lung nodularity as well as the focal nodule left upper lobe with a fiduciary marker distant with known history of neoplasm. No developing new lymph node enlargement seen in the chest, abdomen and pelvis. New compression deformities along the thoracic spine at T12 and T6 compared to the study of 10/15/2023. Please correlate with any  specific history and known injury. Additional workup as clinically appropriate to assess for etiology and exclude a marrow  replacement process. Thyroid goiter. Emphysematous lung changes. Electronically Signed   By: Karen Kays M.D.   On: 11/11/2023 12:49    Procedures Procedures    Medications Ordered in ED Medications  piperacillin-tazobactam (ZOSYN) IVPB 3.375 g ( Intravenous Infusion Verify 11/11/23 2300)  heparin injection 5,000 Units (has no administration in time range)  acetaminophen (OFIRMEV) IV 1,000 mg (0 mg Intravenous Stopped 11/11/23 1821)  propofol (DIPRIVAN) 1000 MG/100ML infusion (40 mcg/kg/min  54.4 kg Intravenous Infusion Verify 11/11/23 2300)  fentaNYL (SUBLIMAZE) injection 25 mcg (has no administration in time range)  fentaNYL (SUBLIMAZE) injection 25-100 mcg (50 mcg Intravenous Given 11/11/23 1855)  pantoprazole (PROTONIX) injection 40 mg (40 mg Intravenous Given 11/11/23 1805)  Oral care mouth rinse (15 mLs Mouth Rinse Given 11/11/23 2056)  Oral care mouth rinse (has no administration in time range)  0.9 %  sodium chloride infusion (250 mLs Intravenous Not Given 11/11/23 1810)  norepinephrine (LEVOPHED) 4mg  in (0.016 mg/mL) premix infusion (10 mcg/min Intravenous Infusion Verify 11/11/23 2300)  phenylephrine (NEO-SYNEPHRINE) 20mg /NS premix infusion (25 mcg/min Intravenous Infusion Verify 11/11/23 2300)  dextrose 5 % in lactated ringers infusion ( Intravenous Infusion Verify 11/11/23 2300)  fentaNYL (SUBLIMAZE) injection 50 mcg (50 mcg Intravenous Given 11/11/23 1218)  sodium chloride 0.9 % bolus 1,000 mL (0 mLs Intravenous Stopped 11/11/23 1223)  ondansetron (ZOFRAN) injection 4 mg (4 mg Intravenous Given 11/11/23 1123)  iohexol (OMNIPAQUE) 350 MG/ML injection 100 mL (100 mLs Intravenous Contrast Given 11/11/23 1211)  ondansetron (ZOFRAN) injection 4 mg (4 mg Intravenous Given 11/11/23 1215)  piperacillin-tazobactam (ZOSYN) IVPB 3.375 g (0 g Intravenous Stopped 11/11/23 1316)  lidocaine (XYLOCAINE) 2 % jelly 1 Application (1 Application Topical Given 11/11/23 1300)  oxymetazoline (AFRIN) 0.05 % nasal  spray 1 spray (1 spray Each Nare Given 11/11/23 1300)  lactated ringers bolus 1,000 mL (1,000 mLs Intravenous Transfusing/Transfer 11/11/23 1347)  fentaNYL (SUBLIMAZE) injection 25 mcg (25 mcg Intravenous Given 11/11/23 1326)  chlorhexidine (PERIDEX) 0.12 % solution 15 mL (15 mLs Mouth/Throat Given 11/11/23 1408)    Or  Oral care mouth rinse ( Mouth Rinse See Alternative 11/11/23 1408)  fentaNYL (SUBLIMAZE) injection 50 mcg (50 mcg Intravenous Given 11/11/23 1525)  lactated ringers bolus 1,000 mL ( Intravenous Infusion Verify 11/11/23 1900)    ED Course/ Medical Decision Making/ A&P                                   77yo female with history of hypertension, OSA, COPD, lung cancer who presents with concern for abdominal pain.    DDx includes aortic dissection, aortic thrombus, mesenteric ischemia, volvulus, SBO.  Given poor perfusion to distal extremities, hypotension to 80s, ill appearance, ordered CTA dissection study.  Called radiology and general surgery as soon as CT completed.  CT with high grade small bowel obstruction.  NG tube placed.   Lactic acid 4.2.  Given zosyn empirically to cover intraabdominal infection.   General Surgery Dr. Dwain Sarna came to bedside and taken to the OR emergently.         Final Clinical Impression(s) / ED Diagnoses Final diagnoses:  Small bowel obstruction (HCC)  Lactic acidosis    Rx / DC Orders ED Discharge Orders     None         Alvira Monday, MD 11/11/23 2338

## 2023-11-11 NOTE — ED Notes (Signed)
 Lb called to redraw light green top - hemolyzed.

## 2023-11-11 NOTE — Progress Notes (Signed)
 Pharmacy Antibiotic Note  Jenny White is a 78 y.o. female admitted on 11/11/2023 with  intra-abdominal infection .  Pharmacy has been consulted for zosyn dosing.  Plan: Zosyn 3.35G IV q8 hours Monitor clinical progression and cultures     Temp (24hrs), Avg:97.7 F (36.5 C), Min:97.7 F (36.5 C), Max:97.7 F (36.5 C)  Recent Labs  Lab 11/11/23 1012 11/11/23 1127 11/11/23 1132 11/11/23 1143  WBC 11.1*  --   --   --   CREATININE  --  0.96  --  0.80  LATICACIDVEN  --   --  4.2*  --     CrCl cannot be calculated (Unknown ideal weight.).    Allergies  Allergen Reactions   Demerol Hcl [Meperidine] Nausea And Vomiting, Nausea Only and Other (See Comments)   Percocet [Oxycodone-Acetaminophen] Nausea And Vomiting     Thank you for allowing pharmacy to be a part of this patient's care.  Ruben Im, PharmD Clinical Pharmacist 11/11/2023 12:35 PM Please check AMION for all [facility] Pharmacy numbers

## 2023-11-11 NOTE — Plan of Care (Signed)
  Problem: Clinical Measurements: Goal: Will remain free from infection Outcome: Progressing Goal: Respiratory complications will improve Outcome: Progressing   Problem: Nutrition: Goal: Adequate nutrition will be maintained Outcome: Progressing   Problem: Coping: Goal: Level of anxiety will decrease Outcome: Progressing   

## 2023-11-11 NOTE — Anesthesia Postprocedure Evaluation (Signed)
 Anesthesia Post Note  Patient: Jenny White  Procedure(s) Performed: LAPAROTOMY, EXPLORATORY APPLICATION, WOUND VAC (Abdomen)     Patient location during evaluation: ICU Anesthesia Type: General Level of consciousness: sedated and patient remains intubated per anesthesia plan Pain management: pain level controlled Vital Signs Assessment: post-procedure vital signs reviewed and stable Respiratory status: patient remains intubated per anesthesia plan Cardiovascular status: stable Postop Assessment: no apparent nausea or vomiting Anesthetic complications: no   No notable events documented.  Last Vitals:  Vitals:   11/11/23 1400 11/11/23 1646  BP: 136/67   Pulse: (!) 111 93  Resp: 20 18  Temp: 36.6 C   SpO2: 99% 100%                  Beryle Lathe

## 2023-11-11 NOTE — Anesthesia Procedure Notes (Signed)
 Procedure Name: Intubation Date/Time: 11/11/2023 3:51 PM  Performed by: Sharyn Dross, CRNAPre-anesthesia Checklist: Patient identified, Emergency Drugs available, Suction available and Patient being monitored Patient Re-evaluated:Patient Re-evaluated prior to induction Oxygen Delivery Method: Circle system utilized Preoxygenation: Pre-oxygenation with 100% oxygen Induction Type: IV induction Ventilation: Mask ventilation without difficulty Laryngoscope Size: Glidescope and 4 Grade View: Grade I Tube type: Oral Tube size: 7.0 mm Number of attempts: 1 Airway Equipment and Method: Oral airway and Rigid stylet Placement Confirmation: ETT inserted through vocal cords under direct vision, positive ETCO2 and breath sounds checked- equal and bilateral Secured at: 22 cm Tube secured with: Tape Dental Injury: Teeth and Oropharynx as per pre-operative assessment

## 2023-11-11 NOTE — Op Note (Signed)
 Preoperative diagnosis: sbo, possible ischemic bowel Postoperative diagnosis: sbo Procedure: Exploratory laparotomy Small bowel resection Abthera placement Surgeon: Dr Harden Mo EBL: < 50 cc Anesthesia: general Specimens: small bowel to pathology Complications hypotension requiring vac placement Dispo ICU critical  Indications: This is 78 year old female multiple medical problems who has a prior splenectomy.  She presented with tachycardia, peritonitis on examination, elevated lactate and a CT scan that showed a small bowel obstruction with associated mesenteric edema and some fluid.  I discussed proceeding to the operating room urgently.  Procedure: After informed consent was obtained she was taken to the operating room.  She was already given antibiotics.  SCDs were in place.  She was placed under general anesthesia without complication.  She was prepped and draped in a standard sterile surgical fashion.  A surgical timeout was then performed.  She had a Foley catheter nasogastric tube in place.  I then used her old incision and carried this to the fascia.  I picked the fascia with Coker clamps and I incised this with Metzenbaum scissors and entered into the peritoneum without injury.  I then was able to clear off the omentum and open the incision in its entirety.  She had a fair amount of free fluid present that was evacuated.  I lysed the adhesions from the omentum to the abdominal wall.  I was then able to eviscerate her small bowel.  Some of the bowel was moderately ischemic.  The obstruction appeared to be distal and there was a single band that I cut and released what was a closed-loop obstruction from the proximal to distal ileum.  This bowel over some time did look better.  She was profoundly hypotensive during this portion of the procedure.  She was on Neo-Synephrine.  She had an arterial line placed.  I elected at this point to abort the operation and not do an anastomosis as I  was concerned that with the hypotension this would not be viable.  I think the safer thing to do would be to bring her back in 24 to 48 hours when she is resuscitated and try to anastomose her bowel.  Hemostasis was observed.  I was able to run her bowel from the left upper quadrant which was very scarred and due to her prior splenectomy all the way to her terminal ileum.  I used the GIA stapler to divide the small bowel just distal to the distal portion of the closed-loop obstruction.  I then divided this proximally and used the LigaSure to divide the mesentery.  The ends of these ends were brought into apposition with a 3-0 silk suture.  I then placed an ABThera VAC.  This was functional upon completion.  She will be transferred to the ICU.

## 2023-11-11 NOTE — ED Notes (Signed)
 Fentanyl not given due to low BP. Return to [facility].

## 2023-11-11 NOTE — Procedures (Deleted)
 Extubation Procedure Note  Patient Details:   Name: Jenny White DOB: 01-Jul-1946 MRN: [id number]   Airway Documentation:    Vent end date: 11/11/23 Vent end time: 1736   Evaluation  O2 sats: stable throughout Complications: No apparent complications Patient did tolerate procedure well. Bilateral Breath Sounds: Clear   Yes  Patient extubated per protocol to 4L [place] with no complications. Positive cuff leak was noted prior to extubation. Patient achieved NIF of -32 and VC of .8L with good patient. Patient is alert and oriented, has strong cough, and is able to speak. Vitals are stable. RT will continue to monitor.   Mariya Mottley Lajuana Ripple 11/11/2023, 5:43 PM
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
## 2023-11-11 NOTE — Transfer of Care (Addendum)
 Immediate Anesthesia Transfer of Care Note  Patient: Jenny White  Procedure(s) Performed: LAPAROTOMY, EXPLORATORY APPLICATION, WOUND VAC (Abdomen)  Patient Location: ICU  Anesthesia Type:General  Level of Consciousness: Patient remains intubated per anesthesia plan  Airway & Oxygen Therapy: Patient remains intubated per anesthesia plan  Post-op Assessment: Report given to RN and Post -op Vital signs reviewed and stable  Post vital signs: Reviewed and stable  Last Vitals:  Vitals Value Taken Time  BP 140/60   Temp    Pulse 70   Resp 12   SpO2 99     Last Pain:  Vitals:   11/11/23 1525  TempSrc:   PainSc: 10-Worst pain ever      Patients Stated Pain Goal: 1 (11/11/23 1359)  Complications: No notable events documented.

## 2023-11-11 NOTE — ED Notes (Signed)
 Blue bottle blood cx sent to lab.

## 2023-11-11 NOTE — ED Notes (Signed)
 Xray at [facility], DIL at [facility]. IV antibiotics and LR liter bolus infusing.

## 2023-11-11 NOTE — ED Notes (Signed)
 EDP at [facility] updating pt and family.

## 2023-11-11 NOTE — Anesthesia Preprocedure Evaluation (Addendum)
 Anesthesia Evaluation  Patient identified by MRN, date of birth, ID band Patient awake    Reviewed: Allergy & Precautions, NPO status , Patient's Chart, lab work & pertinent test results  History of Anesthesia Complications (+) DIFFICULT AIRWAY and history of anesthetic complications  Airway Mallampati: III  TM Distance: <3 FB Neck ROM: Full    Dental  (+) Dental Advisory Given, Teeth Intact   Pulmonary sleep apnea , COPD,  COPD inhaler, former smoker  Lung cancer    Pulmonary exam normal        Cardiovascular hypertension, Pt. on medications Normal cardiovascular exam     Neuro/Psych negative neurological ROS  negative psych ROS   GI/Hepatic Neg liver ROS,GERD  Medicated and Controlled,, SBO    Endo/Other  negative endocrine ROS    Renal/GU negative Renal ROS     Musculoskeletal  (+) Arthritis ,    Abdominal   Peds  Hematology negative hematology ROS (+)   Anesthesia Other Findings   Reproductive/Obstetrics                             Anesthesia Physical Anesthesia Plan  ASA: 3  Anesthesia Plan: General   Post-op Pain Management: Ofirmev IV (intra-op)*   Induction: Intravenous and Rapid sequence  PONV Risk Score and Plan: 3 and Treatment may vary due to age or medical condition, Ondansetron and Propofol infusion  Airway Management Planned: Oral ETT and Video Laryngoscope Planned  Additional Equipment: None  Intra-op Plan:   Post-operative Plan: Extubation in OR  Informed Consent: I have reviewed the patients History and Physical, chart, labs and discussed the procedure including the risks, benefits and alternatives for the proposed anesthesia with the patient or authorized representative who has indicated his/her understanding and acceptance.     Dental advisory given  Plan Discussed with: CRNA and Anesthesiologist  Anesthesia Plan Comments: (Of anesthesia  records in system, only DL noted to be grade 2 view with Mac 3. All other intubations were elective glidescopes without DL prior to glidescope use, per the records. Will still plan to electively glidescope given need for RSI)       Anesthesia Quick Evaluation
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
## 2023-11-11 NOTE — Progress Notes (Signed)
 eLink Physician-Brief Progress Note Patient Name: Jenny White DOB: [birth year] MRN: [id number]   Date of Service  11/11/2023  HPI/Events of Note  Received sign out to follow up on post op labs Patient is s/p ex lap for SBO with adhesioloysis and small bowel resection and kept in discontinuity, wound vac in place with plans to being back to surgery 24-48 hours.  Remains intubated and adequately sedated/pain controlled CXR reviewed ETT in proper position  eICU Interventions  No significant electrolyte abnormality. H/H 12.9/40.7.     Intervention Category Intermediate Interventions: Diagnostic test evaluation  Darl Pikes 11/11/2023, 7:28 PM

## 2023-11-11 NOTE — ED Triage Notes (Addendum)
 EMS stated, abdominal pain N/V since yesterday. Coming from home. P's abdomen is hard to palpate . Pt stated, she has NOT had a bowel movement in almost a week. Last BM was medium Iv 22g in left hand IVF N/S 100c

## 2023-11-11 NOTE — ED Notes (Signed)
 C/o pain, feeling cold, and nausea.

## 2023-11-11 NOTE — ED Notes (Signed)
GI PA at [facility]

## 2023-11-11 NOTE — ED Notes (Signed)
 IVF infusing. EDP at [facility] with [place]. To CTA as code medical on monitor, O2 with RN. EDP into CT with pt.

## 2023-11-12 ENCOUNTER — Encounter (HOSPITAL_COMMUNITY): Payer: Self-pay | Admitting: General Surgery

## 2023-11-12 ENCOUNTER — Inpatient Hospital Stay (HOSPITAL_COMMUNITY)

## 2023-11-12 ENCOUNTER — Other Ambulatory Visit: Payer: Self-pay

## 2023-11-12 DIAGNOSIS — A419 Sepsis, unspecified organism: Secondary | ICD-10-CM | POA: Diagnosis not present

## 2023-11-12 DIAGNOSIS — R6521 Severe sepsis with septic shock: Secondary | ICD-10-CM | POA: Diagnosis not present

## 2023-11-12 DIAGNOSIS — I480 Paroxysmal atrial fibrillation: Secondary | ICD-10-CM

## 2023-11-12 DIAGNOSIS — K56609 Unspecified intestinal obstruction, unspecified as to partial versus complete obstruction: Secondary | ICD-10-CM | POA: Diagnosis not present

## 2023-11-12 DIAGNOSIS — N179 Acute kidney failure, unspecified: Secondary | ICD-10-CM

## 2023-11-12 DIAGNOSIS — J9601 Acute respiratory failure with hypoxia: Secondary | ICD-10-CM | POA: Diagnosis not present

## 2023-11-12 LAB — CBC
HCT: 40.3 % (ref 36.0–46.0)
HCT: 38 % (ref 36.0–46.0)
Hemoglobin: 12.9 g/dL (ref 12.0–15.0)
Hemoglobin: 12.3 g/dL (ref 12.0–15.0)
MCH: 30.4 pg (ref 26.0–34.0)
MCH: 30 pg (ref 26.0–34.0)
MCHC: 32 g/dL (ref 30.0–36.0)
MCHC: 32.4 g/dL (ref 30.0–36.0)
MCV: 94.8 fL (ref 80.0–100.0)
MCV: 92.7 fL (ref 80.0–100.0)
Platelets: 232 10*3/uL (ref 150–400)
Platelets: 197 10*3/uL (ref 150–400)
RBC: 4.25 MIL/uL (ref 3.87–5.11)
RBC: 4.1 MIL/uL (ref 3.87–5.11)
RDW: 16.8 % — ABNORMAL HIGH (ref 11.5–15.5)
RDW: 17 % — ABNORMAL HIGH (ref 11.5–15.5)
WBC: 3.9 10*3/uL — ABNORMAL LOW (ref 4.0–10.5)
WBC: 1.8 10*3/uL — ABNORMAL LOW (ref 4.0–10.5)
nRBC: 0.5 % — ABNORMAL HIGH (ref 0.0–0.2)
nRBC: 0 % (ref 0.0–0.2)

## 2023-11-12 LAB — BASIC METABOLIC PANEL
Anion gap: 11 (ref 5–15)
Anion gap: 13 (ref 5–15)
BUN: 40 mg/dL — ABNORMAL HIGH (ref 8–23)
BUN: 44 mg/dL — ABNORMAL HIGH (ref 8–23)
CO2: 23 mmol/L (ref 22–32)
CO2: 21 mmol/L — ABNORMAL LOW (ref 22–32)
Calcium: 7.8 mg/dL — ABNORMAL LOW (ref 8.9–10.3)
Calcium: 7.6 mg/dL — ABNORMAL LOW (ref 8.9–10.3)
Chloride: 104 mmol/L (ref 98–111)
Chloride: 101 mmol/L (ref 98–111)
Creatinine, Ser: 1.23 mg/dL — ABNORMAL HIGH (ref 0.44–1.00)
Creatinine, Ser: 1.36 mg/dL — ABNORMAL HIGH (ref 0.44–1.00)
GFR, Estimated: 45 mL/min — ABNORMAL LOW (ref >60)
GFR, Estimated: 40 mL/min — ABNORMAL LOW (ref >60)
Glucose, Bld: 155 mg/dL — ABNORMAL HIGH (ref 70–99)
Glucose, Bld: 146 mg/dL — ABNORMAL HIGH (ref 70–99)
Potassium: 3.8 mmol/L (ref 3.5–5.1)
Potassium: 3.8 mmol/L (ref 3.5–5.1)
Sodium: 138 mmol/L (ref 135–145)
Sodium: 135 mmol/L (ref 135–145)

## 2023-11-12 LAB — POCT I-STAT 7, (LYTES, BLD GAS, ICA,H+H)
Acid-base deficit: 7 mmol/L — ABNORMAL HIGH (ref 0.0–2.0)
Acid-base deficit: 3 mmol/L — ABNORMAL HIGH (ref 0.0–2.0)
Bicarbonate: 19.2 mmol/L — ABNORMAL LOW (ref 20.0–28.0)
Bicarbonate: 22.9 mmol/L (ref 20.0–28.0)
Calcium, Ion: 1.05 mmol/L — ABNORMAL LOW (ref 1.15–1.40)
Calcium, Ion: 1.12 mmol/L — ABNORMAL LOW (ref 1.15–1.40)
HCT: 40 % (ref 36.0–46.0)
HCT: 36 % (ref 36.0–46.0)
Hemoglobin: 13.6 g/dL (ref 12.0–15.0)
Hemoglobin: 12.2 g/dL (ref 12.0–15.0)
O2 Saturation: 97 %
O2 Saturation: 99 %
Patient temperature: 36.2
Patient temperature: 36.6
Potassium: 4.1 mmol/L (ref 3.5–5.1)
Potassium: 3.6 mmol/L (ref 3.5–5.1)
Sodium: 136 mmol/L (ref 135–145)
Sodium: 136 mmol/L (ref 135–145)
TCO2: 20 mmol/L — ABNORMAL LOW (ref 22–32)
TCO2: 24 mmol/L (ref 22–32)
pCO2 arterial: 39.5 mmHg (ref 32–48)
pCO2 arterial: 43.7 mmHg (ref 32–48)
pH, Arterial: 7.291 — ABNORMAL LOW (ref 7.35–7.45)
pH, Arterial: 7.325 — ABNORMAL LOW (ref 7.35–7.45)
pO2, Arterial: 100 mmHg (ref 83–108)
pO2, Arterial: 131 mmHg — ABNORMAL HIGH (ref 83–108)

## 2023-11-12 LAB — GLUCOSE, CAPILLARY
Glucose-Capillary: 106 mg/dL — ABNORMAL HIGH (ref 70–99)
Glucose-Capillary: 112 mg/dL — ABNORMAL HIGH (ref 70–99)
Glucose-Capillary: 128 mg/dL — ABNORMAL HIGH (ref 70–99)

## 2023-11-12 LAB — COMPREHENSIVE METABOLIC PANEL
ALT: 25 U/L (ref 0–44)
AST: 40 U/L (ref 15–41)
Albumin: 2.4 g/dL — ABNORMAL LOW (ref 3.5–5.0)
Alkaline Phosphatase: 60 U/L (ref 38–126)
Anion gap: 14 (ref 5–15)
BUN: 43 mg/dL — ABNORMAL HIGH (ref 8–23)
CO2: 18 mmol/L — ABNORMAL LOW (ref 22–32)
Calcium: 8.5 mg/dL — ABNORMAL LOW (ref 8.9–10.3)
Chloride: 106 mmol/L (ref 98–111)
Creatinine, Ser: 1.27 mg/dL — ABNORMAL HIGH (ref 0.44–1.00)
GFR, Estimated: 43 mL/min — ABNORMAL LOW (ref >60)
Glucose, Bld: 164 mg/dL — ABNORMAL HIGH (ref 70–99)
Potassium: 3.9 mmol/L (ref 3.5–5.1)
Sodium: 138 mmol/L (ref 135–145)
Total Bilirubin: 0.6 mg/dL (ref 0.0–1.2)
Total Protein: 4.6 g/dL — ABNORMAL LOW (ref 6.5–8.1)

## 2023-11-12 LAB — PROTIME-INR
INR: 1 (ref 0.8–1.2)
Prothrombin Time: 13.5 s (ref 11.4–15.2)

## 2023-11-12 LAB — CG4 I-STAT (LACTIC ACID): Lactic Acid, Venous: 2.5 mmol/L (ref 0.5–1.9)

## 2023-11-12 LAB — MAGNESIUM
Magnesium: 1.6 mg/dL — ABNORMAL LOW (ref 1.7–2.4)
Magnesium: 2.8 mg/dL — ABNORMAL HIGH (ref 1.7–2.4)
Magnesium: 2.5 mg/dL — ABNORMAL HIGH (ref 1.7–2.4)

## 2023-11-12 LAB — TRIGLYCERIDES: Triglycerides: 50 mg/dL (ref <150)

## 2023-11-12 LAB — PHOSPHORUS: Phosphorus: 4.5 mg/dL (ref 2.5–4.6)

## 2023-11-12 MED ORDER — FUROSEMIDE 10 MG/ML IJ SOLN
80.0000 mg | Freq: Once | INTRAMUSCULAR | Status: AC
  Administered 2023-11-12: 80 mg via INTRAVENOUS
  Filled 2023-11-12: qty 8

## 2023-11-12 MED ORDER — MAGNESIUM SULFATE 2 GM/50ML IV SOLN
2.0000 g | Freq: Once | INTRAVENOUS | Status: AC
  Administered 2023-11-12: 2 g via INTRAVENOUS
  Filled 2023-11-12: qty 50

## 2023-11-12 MED ORDER — SODIUM CHLORIDE 0.9% FLUSH
10.0000 mL | Freq: Two times a day (BID) | INTRAVENOUS | Status: DC
  Administered 2023-11-12 (×2): 20 mL
  Administered 2023-11-13 (×2): 10 mL
  Administered 2023-11-14: 20 mL
  Administered 2023-11-14 – 2023-11-15 (×2): 10 mL
  Administered 2023-11-15: 40 mL
  Administered 2023-11-16 – 2023-11-18 (×3): 10 mL
  Administered 2023-11-19 – 2023-11-20 (×2): 30 mL
  Administered 2023-11-20 – 2023-11-21 (×2): 10 mL
  Administered 2023-11-21: 30 mL
  Administered 2023-11-22: 10 mL
  Administered 2023-11-23: 30 mL
  Administered 2023-11-23 – 2023-11-24 (×2): 10 mL
  Administered 2023-11-24: 15 mL
  Administered 2023-11-25 – 2023-11-26 (×3): 10 mL
  Administered 2023-11-26 – 2023-11-27 (×2): 30 mL
  Administered 2023-11-27 – 2023-11-29 (×4): 10 mL
  Administered 2023-11-30: 30 mL
  Administered 2023-11-30: 20 mL
  Administered 2023-12-01: 10 mL
  Administered 2023-12-01 – 2023-12-02 (×2): 30 mL
  Administered 2023-12-02 – 2023-12-03 (×3): 10 mL
  Administered 2023-12-04: 20 mL
  Administered 2023-12-04 – 2023-12-08 (×8): 10 mL
  Administered 2023-12-08: 20 mL
  Administered 2023-12-09 (×2): 10 mL
  Administered 2023-12-10: 30 mL
  Administered 2023-12-10 – 2023-12-11 (×2): 10 mL

## 2023-11-12 MED ORDER — FENTANYL 2500MCG IN NS 250ML (10MCG/ML) PREMIX INFUSION
25.0000 ug/h | INTRAVENOUS | Status: DC
Start: 2023-11-12 — End: 2023-11-14
  Administered 2023-11-12: 25 ug/h via INTRAVENOUS
  Administered 2023-11-13: 100 ug/h via INTRAVENOUS
  Filled 2023-11-12 (×2): qty 250

## 2023-11-12 MED ORDER — SODIUM CHLORIDE 0.9 % IV BOLUS
500.0000 mL | Freq: Once | INTRAVENOUS | Status: AC
  Administered 2023-11-12: 500 mL via INTRAVENOUS

## 2023-11-12 MED ORDER — SODIUM BICARBONATE 8.4 % IV SOLN
100.0000 meq | Freq: Once | INTRAVENOUS | Status: AC
  Administered 2023-11-12: 100 meq via INTRAVENOUS
  Filled 2023-11-12: qty 100

## 2023-11-12 MED ORDER — NOREPINEPHRINE 4 MG/250ML-% IV SOLN
0.0000 ug/min | INTRAVENOUS | Status: DC
  Administered 2023-11-12: 20 ug/min via INTRAVENOUS
  Administered 2023-11-12: 19 ug/min via INTRAVENOUS
  Administered 2023-11-12: 27 ug/min via INTRAVENOUS
  Administered 2023-11-12: 20 ug/min via INTRAVENOUS
  Administered 2023-11-13: 29 ug/min via INTRAVENOUS
  Administered 2023-11-13: 28 ug/min via INTRAVENOUS
  Administered 2023-11-13: 25 ug/min via INTRAVENOUS
  Administered 2023-11-13: 6 ug/min via INTRAVENOUS
  Administered 2023-11-13: 16 ug/min via INTRAVENOUS
  Administered 2023-11-14: 17 ug/min via INTRAVENOUS
  Administered 2023-11-14: 8 ug/min via INTRAVENOUS
  Administered 2023-11-14: 4 ug/min via INTRAVENOUS
  Administered 2023-11-15: 6 ug/min via INTRAVENOUS
  Administered 2023-11-15: 10 ug/min via INTRAVENOUS
  Administered 2023-11-15: 1 ug/min via INTRAVENOUS
  Administered 2023-11-16: 9 ug/min via INTRAVENOUS
  Administered 2023-11-16: 13 ug/min via INTRAVENOUS
  Administered 2023-11-16: 16 ug/min via INTRAVENOUS
  Administered 2023-11-16: 11 ug/min via INTRAVENOUS
  Administered 2023-11-17: 17 ug/min via INTRAVENOUS
  Filled 2023-11-12 (×19): qty 250

## 2023-11-12 MED ORDER — LACTATED RINGERS IV BOLUS
1000.0000 mL | Freq: Once | INTRAVENOUS | Status: AC
  Administered 2023-11-12: 1000 mL via INTRAVENOUS

## 2023-11-12 MED ORDER — METOPROLOL TARTRATE 5 MG/5ML IV SOLN
5.0000 mg | Freq: Once | INTRAVENOUS | Status: AC
  Administered 2023-11-12: 5 mg via INTRAVENOUS
  Filled 2023-11-12: qty 5

## 2023-11-12 MED ORDER — SODIUM CHLORIDE 0.9% FLUSH: 10.0000 mL | INTRAVENOUS | Status: DC | PRN

## 2023-11-12 MED ORDER — CHLORHEXIDINE GLUCONATE CLOTH 2 % EX PADS
6.0000 | MEDICATED_PAD | Freq: Every day | CUTANEOUS | Status: DC
  Administered 2023-11-12 – 2023-11-26 (×16): 6 via TOPICAL

## 2023-11-12 NOTE — Progress Notes (Signed)
 Peripherally Inserted Central Catheter Placement  The IV Nurse has discussed with the patient and/or persons authorized to consent for the patient, the purpose of this procedure and the potential benefits and risks involved with this procedure.  The benefits include less needle sticks, lab draws from the catheter, and the patient may be discharged home with the catheter. Risks include, but not limited to, infection, bleeding, blood clot (thrombus formation), and puncture of an artery; nerve damage and irregular heartbeat and possibility to perform a PICC exchange if needed/ordered by physician.  Alternatives to this procedure were also discussed.  Bard Power PICC patient education guide, fact sheet on infection prevention and patient information card has been provided to patient /or left at bedside.   Consent obtained with son via telephone   PICC Placement Documentation  PICC Triple Lumen 11/12/23 Right Brachial 36 cm 0 cm (Active)  Indication for Insertion or Continuance of Line Vasoactive infusions 11/12/23 0800  Exposed Catheter (cm) 0 cm 11/12/23 0800  Site Assessment Clean, Dry, Intact 11/12/23 0800  Lumen #1 Status Flushed;Saline locked;Blood return noted 11/12/23 0800  Lumen #2 Status Flushed;Saline locked;Blood return noted 11/12/23 0800  Lumen #3 Status Flushed;Saline locked;Blood return noted 11/12/23 0800  Dressing Type Transparent;Securing device 11/12/23 0800  Dressing Status Antimicrobial disc/dressing in place;Clean, Dry, Intact 11/12/23 0800  Line Care Connections checked and tightened 11/12/23 0800  Line Adjustment (NICU/IV Team Only) No 11/12/23 0800  Dressing Intervention New dressing 11/12/23 0800  Dressing Change Due 11/19/23 11/12/23 0800       Franne Grip Renee 11/12/2023, 8:52 AM

## 2023-11-12 NOTE — Progress Notes (Signed)
 eLink Physician-Brief Progress Note Patient Name: Jenny White DOB: September 15, 1945 MRN: [id number]   Date of Service  11/12/2023  HPI/Events of Note  Magnesium 1.6 Creatinine 1.27  eICU Interventions  Ordered magnesium sulfate 1 g IVPB     Intervention Category Intermediate Interventions: Electrolyte abnormality - evaluation and management  Darl Pikes 11/12/2023, 5:09 AM

## 2023-11-12 NOTE — Anesthesia Preprocedure Evaluation (Signed)
 Anesthesia Evaluation  Patient identified by MRN, date of birth, ID band Patient awake    Reviewed: Allergy & Precautions, NPO status , Patient's Chart, lab work & pertinent test results  History of Anesthesia Complications (+) DIFFICULT AIRWAY and history of anesthetic complications  Airway Mallampati: Intubated      Comment: Previous grade I view with glidescope 4, easy mask Dental   Pulmonary sleep apnea , COPD, former smoker Lung cancer   breath sounds clear to auscultation       Cardiovascular hypertension, + dysrhythmias Atrial Fibrillation  Rhythm:Regular Rate:Normal     Neuro/Psych  Neuromuscular disease (lumbar stenosis)    GI/Hepatic ,GERD  ,,High grade SBO with bowel ischemia S/p EX- lap/ LOA 3/4   Endo/Other    Renal/GU ARFRenal disease     Musculoskeletal  (+) Arthritis ,    Abdominal   Peds  Hematology   Anesthesia Other Findings Septic shock due to acute peritonitis  On fentanyl 50 mcg/h, propofol 30 mcg/kg/min, and norepinephrine at 32 mcg/min.  Current vent settings: PRVC FiO2 40% PEEP 5 RR 28 TV 490  Reproductive/Obstetrics                             Anesthesia Physical Anesthesia Plan  ASA: 4  Anesthesia Plan: General   Post-op Pain Management:    Induction: Intravenous  PONV Risk Score and Plan: 3 and Treatment may vary due to age or medical condition  Airway Management Planned: Oral ETT  Additional Equipment:   Intra-op Plan:   Post-operative Plan: Post-operative intubation/ventilation  Informed Consent: I have reviewed the patients History and Physical, chart, labs and discussed the procedure including the risks, benefits and alternatives for the proposed anesthesia with the patient or authorized representative who has indicated his/her understanding and acceptance.       Plan Discussed with: Anesthesiologist and CRNA  Anesthesia Plan  Comments: (Risks of general anesthesia discussed including, but not limited to, sore throat, hoarse voice, chipped/damaged teeth, injury to vocal cords, nausea and vomiting, allergic reactions, lung infection, heart attack, stroke, and death. All questions answered.  Consent obtained in person from the patient's son, Jenny White.)        Anesthesia Quick Evaluation

## 2023-11-12 NOTE — Progress Notes (Signed)
 Nutrition Follow-up  DOCUMENTATION CODES:   Not applicable  INTERVENTION:   If unable to utilize enteral route for nutrition in 3-4 days, recommend considering TPN initiation. Pending results in OR Tomorrow, pt may benefit from earlier initiation of TPN   NUTRITION DIAGNOSIS:   Inadequate oral intake related to acute illness, altered GI function as evidenced by NPO status.  GOAL:   Patient will meet greater than or equal to 90% of their needs  MONITOR:   Vent status, Labs, Weight trends  REASON FOR ASSESSMENT:   Ventilator    ASSESSMENT:   78 yo female admitted with mixed shock, septic and hypovolemic, high grade SBO with bowel ischemia. PMH includes COPD, GERD, nephrolithiasis, HTN, OSA, lung cancer (adenocarcinoma RLL dx 05/2023)  3/04 Ex Lap, SB resection, Abthera wound VAC placement, open abdomen with bowel in discontinuity  Pt remains on vent support, noted plan to return to OR tomorrow for anastomosis vs ileostomy pending clinical status  Propofol: 13.2 ml/hr (349 kcals) Levophed at 10 Phenylephrine at 50  Strict NPO, NG (distal stomach per abd xray) to LIS, 1.25 L output in 24 hours. PICC line placed today D5-LR at 75 ml/hr x 24 hours; pt has received multiple 1L LR boluses as well  UOP 900 mL in 24 hours Abthera 200 mL  Unable to obtain nutrition history from pt at this time.   Labs: Sodium 138 (wdl) Potassium 3.9 (wdl) Phosphorus 4.5 (wdl) Magnesium (1.6 (L) BUN 43, Creatinine 1.27 CBGs 106-134 Lactic Acid 1.8 (wdl) TG 50  Meds:  Protonix Mag sulfate 2 g x 1    NUTRITION - FOCUSED PHYSICAL EXAM:  Flowsheet Row Most Recent Value  Orbital Region No depletion  Upper Arm Region No depletion  Thoracic and Lumbar Region Unable to assess  Buccal Region Unable to assess  Temple Region No depletion  Clavicle Bone Region No depletion  Clavicle and Acromion Bone Region No depletion  Scapular Bone Region No depletion  Dorsal Hand Unable to assess   Patellar Region Unable to assess  Anterior Thigh Region Unable to assess  Posterior Calf Region Unable to assess  Edema (RD Assessment) Mild       Diet Order:   Diet Order             Diet NPO time specified  Diet effective now                   EDUCATION NEEDS:   Not appropriate for education at this time  Skin:  Skin Assessment: Skin Integrity Issues: Skin Integrity Issues:: Wound VAC Wound Vac: Abthera: open abdomen, bowel in discontinuity  Last BM:  PTA  Height:   Ht Readings from Last 1 Encounters:  11/11/23 5\' 7"  (1.702 m)    Weight:   Wt Readings from Last 1 Encounters:  11/12/23 59 kg    BMI:  Body mass index is 20.37 kg/m.  Estimated Nutritional Needs:   Kcal:  1650-1850 kcals  Protein:  85-105 g  Fluid:  >/= 1.7 L   Romelle Starcher MS, RDN, LDN, CNSC Registered Dietitian 3 Clinical Nutrition RD Inpatient Contact Info in Amion

## 2023-11-12 NOTE — Progress Notes (Signed)
 1 Day Post-Op   Subjective/Chief Complaint: Cleared lactate overnight, sedated, intubated on pressors   Objective: Vital signs in last 24 hours: Temp:  [96.1 F (35.6 C)-100.2 F (37.9 C)] 98.2 F (36.8 C) (03/05 0600) Pulse Rate:  [78-132] 98 (03/05 0600) Resp:  [15-37] 25 (03/05 0600) BP: (84-149)/(49-76) 134/60 (03/05 0600) SpO2:  [94 %-100 %] 97 % (03/05 0600) Arterial Line BP: (81-178)/(41-75) 148/55 (03/05 0600) FiO2 (%):  [40 %-100 %] 40 % (03/05 0438) Weight:  [54.4 kg-59 kg] 59 kg (03/05 0500) Last BM Date :  (PTA)  Intake/Output from previous day: 03/04 0701 - 03/05 0700 In: 5001.2 [I.V.:2360.5; IV Piggyback:2640.7] Out: 2600 [Urine:900; Emesis/NG output:1250; Drains:200; Blood:50] Intake/Output this shift: No intake/output data recorded.  General nad intubated Ab vac in place, functional  Lab Results:  Recent Labs    11/11/23 1801 11/12/23 0258  WBC 5.3 3.9*  HGB 12.9 12.9  HCT 40.7 40.3  PLT 225 232   BMET Recent Labs    11/11/23 1801 11/12/23 0250  NA 137 138  K 3.5 3.9  CL 108 106  CO2 19* 18*  GLUCOSE 114* 164*  BUN 34* 43*  CREATININE 0.92 1.27*  CALCIUM 8.4* 8.5*   PT/INR Recent Labs    11/12/23 0250  LABPROT 13.5  INR 1.0   ABG Recent Labs    11/11/23 1710 11/11/23 1758  PHART 7.293* 7.323*  HCO3 23.8 21.0    Studies/Results: [place] EKG SITE RITE Result Date: 11/12/2023 If Site Rite image not attached, placement could not be confirmed due to current cardiac rhythm.  DG CHEST PORT 1 VIEW Result Date: 11/11/2023 CLINICAL DATA:  Intubated EXAM: PORTABLE CHEST 1 VIEW COMPARISON:  06/09/2023, CT 11/11/2023, PET CT 09/08/2023 FINDINGS: Endotracheal tube tip is about 4.4 cm superior to the carina. Enteric tube tip below the diaphragm but incompletely visualized. Emphysema. Vague density with fiducial marker in the left upper lobe. Known peripheral right lung base mass is evident as vague right basilar opacity. Normal cardiac size with  aortic atherosclerosis. IMPRESSION: 1. Endotracheal tube tip about 4.4 cm superior to carina 2. Emphysema. Vague right lung base opacity corresponding to known mass/consolidation in the region. Similar vague left upper lobe opacity with fiducial marker Electronically Signed   By: Jasmine Pang M.D.   On: 11/11/2023 19:36   DG Abdomen 1 View Result Date: 11/11/2023 CLINICAL DATA:  Status post nasogastric tube placement. Emesis and nausea. Non-small-cell lung cancer. 11/11/2023 EXAM: ABDOMEN - 1 VIEW COMPARISON:  CT chest, abdomen, and pelvis 11/11/2023 FINDINGS: New enteric tube descends below the diaphragm and curls along the greater curvature of the stomach with the tip overlying the right upper quadrant, likely within the distal stomach. Right upper quadrant cholecystectomy clips. Contrast excretion by the bilateral kidneys from IV contrast administered for CT 1.5 hours earlier. Fluid-filled bowel loops as on prior CT. The dilated loops of bowel and CT are likely grossly similar to prior, with left lower quadrant bowel loop measuring up to 3.9 cm in caliber. Kyphoplasty cement is again seen within the L2 vertebral body. L3-4 bilateral transpedicular rod and screw fusion hardware with associated intervertebral disc spacer. IMPRESSION: 1. New enteric tube tip overlies the right upper quadrant, likely within the distal stomach. 2. Dilated fluid-filled loops of bowel as on prior CT. Electronically Signed   By: Neita Garnet M.D.   On: 11/11/2023 15:58   CT Angio Chest/Abd/Pel for Dissection W and/or W/WO Result Date: 11/11/2023 CLINICAL DATA:  Non-small-cell lung cancer. Abdominal  pain nausea vomiting. * Tracking Code: BO * EXAM: CT ANGIOGRAPHY CHEST, ABDOMEN AND PELVIS TECHNIQUE: Non-contrast CT of the chest was initially obtained. Multidetector CT imaging through the chest, abdomen and pelvis was performed using the standard protocol during bolus administration of intravenous contrast. Multiplanar reconstructed  images and MIPs were obtained and reviewed to evaluate the vascular anatomy. RADIATION DOSE REDUCTION: This exam was performed according to the departmental dose-optimization program which includes automated exposure control, adjustment of the mA and/or kV according to patient size and/or use of iterative reconstruction technique. CONTRAST:  OMNIPAQUE IOHEXOL 350 MG/ML SOLN COMPARISON:  Chest CT with contrast 10/15/2023. PET-CT 09/08/2023. Older exams as well. FINDINGS: CTA CHEST FINDINGS Cardiovascular: Aortic root has a diameter of 3.2 cm. The ascending aorta at the level of the main pulmonary artery has a diameter of 3.7 x 3.4 cm. The descending thoracic aorta same level measures 2.6 x 2.5 cm. The distal aortic arch has a diameter approaching 3.0 cm. There is mild partially calcified atherosclerotic plaque. Some of the plaque along the descending thoracic aorta is irregular. Slight plaque also along the origin of the great vessels. No abnormal high density along the course of the thoracic aorta on the noncontrast dataset. No dissection or aneurysm formation. Coronary artery calcifications are seen. Tiny pericardial effusion. Heart is nonenlarged. Mediastinum/Nodes: Patulous fluid-filled esophagus. Slight wall thickening of the distal esophagus. There is an enlarged heterogeneous thyroid gland. This was hypermetabolic on PET-CT scan. Please correlate with known history. No specific abnormal lymph node enlargement identified in the axillary regions, hilum or mediastinum. Lungs/Pleura: Emphysematous lung changes identified. Bilateral apical pleural thickening. There is some left upper lobe anterior scarring and bronchiectasis, unchanged from previous. Areas of bronchial wall thickening identified along lower lungs. No pneumothorax, effusion or edema. Areas of mucous plugging along the left lower lobe inferolateral is again noted as well. The focal ill-defined opacity along the dependent right lower lobe is  again seen. Previously this was measured at 3.5 x 3.5 cm and today when measured in similar fashion measures 2.5 x 4.3 cm. Overall similar when adjusted for variances in technique. No new dominant lung lesion. Some tiny nodules identified as well such as left lower lobe anteriorly on series 7, image 117 which is unchanged measuring 4 mm. The central nodule left suprahilar with tissue marker is stable. Previous dimension of 7 mm and today on series 7, image 34 7 mm. Musculoskeletal: Osteopenia. Scattered degenerative changes. There are multiple compression deformities along the spine which are new including T6 and T12. The T12 level has some sclerosis. Please correlate with the time course of injury. Review of the MIP images confirms the above findings. CTA ABDOMEN AND PELVIS FINDINGS VASCULAR Aorta: Partially calcified atherosclerotic plaque. No dissection or aneurysm formation. Maximal dimension of the inferior abdominal aorta of 2.2 cm. Overall there is moderate calcified plaque. Celiac: Mild disease along the origin. There is also calcified plaque along a atretic splenic artery. 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 SMA: Moderate atherosclerotic disease along the origin of the SMA with mild stenosis. More calcified plaque more distally along the SMA. Renals: Single bowel main renal arteries with some mild calcified plaque. IMA: Grossly patent. Inflow: There is calcified moderate disease diffusely along the iliac vessels with only mild areas of stenosis suggested along the common and external iliacs. More moderate along the internal. Of note the common femoral and visualized femoral vessels have more significant disease. Veins: No obvious venous abnormality within the limitations of this arterial  phase study. Review of the MIP images confirms the above findings. NON-VASCULAR Hepatobiliary: With the limits of the early phase of the arterial bolus, grossly the liver is preserved. Previous cholecystectomy with a dilated common  duct but unchanged from prior. Pancreas: Unremarkable. No pancreatic ductal dilatation or surrounding inflammatory changes. 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 Please correlate with history. Adrenals/Urinary Tract: Stable thickening of the adrenal glands. Large upper pole left-sided Bosniak 1 renal cyst measuring 7 cm. A few smaller foci identified as well in each kidney, too small to characterize and poorly seen on this arterial phase only examination. No collecting system dilatation. The ureters have normal course and caliber extending down to the urinary bladder. The bladder is underdistended. Stomach/Bowel: Dilated fluid-filled stomach and small bowel diffusely with caliber change seen of the distal small bowel such as axial image 217 of series 6,. Please see coronal series 9, image 79 in the right lower quadrant. No pneumatosis, free air. Trace free fluid. The colon is relatively decompressed with scattered stool. There are some loops of small bowel extending anterior to the left side of the transverse colon. Evaluation of the bowel wall enhancement is somewhat more difficult as this is a true early arterial phase examination. Please correlate for any clinical evidence of ischemia. Again no pneumatosis. Lymphatic: No specific abnormal lymph node enlargement identified in the abdomen and pelvis. Reproductive: Uterus and bilateral adnexa are unremarkable. Other: No free intra-air. Scattered mild free fluid. Slight mesenteric stranding. Mild mesenteric edema. Musculoskeletal: Degenerative changes seen of the spine and pelvis. Augmentation cement seen at L2. Fixation hardware posteriorly at L3-4. Associated streak artifact. Multilevel degenerative changes and stenosis. Critical Value/emergent results were called by telephone at the time of interpretation on 11/11/2023 at 12:40 pm to provider Alvira Monday, who verbally acknowledged these results. Review of the MIP images confirms the above findings.  IMPRESSION: Scattered calcified as sclerotic plaque. Some areas of plaque are irregular. No dissection or aneurysm formation. There are areas of significant stenosis suggested particularly along the iliac vessels and femoral vessels at the edge of the imaging field. High-grade small-bowel obstruction is with transition point in the right lower quadrant along the distal ileum. There is some trace ascites and some mesenteric stranding. Evaluation the bowel wall in some areas is difficult due to the early phase of the arterial bolus. No pneumatosis. A component of bowel wall ischemia is difficult to completely exclude with this examination. Please correlate with clinical presentation. If needed short follow up could be considered. Distended esophagus with luminal fluid. This could relate to the bowel obstruction. Masslike parenchymal opacity again seen in the right lower lobe. Stable other small areas of lung nodularity as well as the focal nodule left upper lobe with a fiduciary marker distant with known history of neoplasm. No developing new lymph node enlargement seen in the chest, abdomen and pelvis. New compression deformities along the thoracic spine at T12 and T6 compared to the study of 10/15/2023. Please correlate with any specific history and known injury. Additional workup as clinically appropriate to assess for etiology and exclude a marrow replacement process. Thyroid goiter. Emphysematous lung changes. Electronically Signed   By: Karen Kays M.D.   On: 11/11/2023 12:49    Anti-infectives: Anti-infectives (From admission, onward)    Start     Dose/Rate Route Frequency Ordered Stop   11/11/23 2000  piperacillin-tazobactam (ZOSYN) IVPB 3.375 g        3.375 g 12.5 mL/hr over 240 Minutes Intravenous Every 8  hours 11/11/23 1233     11/11/23 1245  piperacillin-tazobactam (ZOSYN) IVPB 3.375 g        3.375 g 100 mL/hr over 30 Minutes Intravenous  Once 11/11/23 1233 11/11/23 1316        Assessment/Plan: POD 1 elap, sbr for sbo/ischemic bowel, abthera placement- MW -plan to return to OR tomorrow for either  anastomosis or pending clinical status ileostomy -continued resuscitation, ideally can get off pressors before OR again -will write for PICC today as likely will need this for ntn and better for pressors -ngt, npo -abx for five days postop -subcutaneous heparin, scds      Emelia Loron 11/12/2023

## 2023-11-12 NOTE — Progress Notes (Signed)
 eLink Physician-Brief Progress Note Patient Name: Jenny White DOB: Jan 17, 1946 MRN: [id number]   Date of Service  11/12/2023  HPI/Events of Note  Atrial Fib.  HR 130-150.  BP normal  eICU Interventions  Dose of lopressor IV x 1        Henry Russel, P 11/12/2023, 8:29 PM

## 2023-11-12 NOTE — Consult Note (Signed)
 NAME:  Jenny White, MRN:  [id number], DOB:  1946/06/09, LOS: 1 ADMISSION DATE:  11/11/2023 CONSULTATION DATE:  11/11/2023 REFERRING MD:  Dwain Sarna - [facility] CHIEF COMPLAINT: Shock  History of Present Illness:  Jenny White 78 year old woman with a pmhx significant for COPD, GERD, nephrolithiasis , HTN, OSA, Lung cancer ( Adenocarcinoma, diagnosed 05/2023) who presented to the emergency room with complaints of abdominal pain with N/V for the past week. She states the abdominal  pain got worsened after dinner. She reported experiencing nausea and  vomiting this morning.   In the ED  her labs were significant for a lactate 4.1 After fluid resuscitation, Lactate decreased to 2.5. CT scan showed high grade small bowel obstruction with transition point in the right lower quadrant along the distal ileum. Some trace of ascites and mesenteric stranding.Concern for bowel wall ischemia. Broad spectrum antibiotics ( zosyn) was started, blood cultures pending.   Patient was taken to the ER emergently with CCS 3/4 for Ex-lap / LOA. Intraoperative course  was notable for free fluid on entry, with closed loop bowel obstruction noted secondary to tight band adhesion. Patient left in discontinuity/ anastomosis not completed with open abdomen and plan for second look in 24-48 hrs. Patient remained intubated and was transferred to ICU in critical condition.   PCCM consulted for ICU transfer.   Pertinent Medical History:   Past Medical History:  Diagnosis Date   Arthritis    in back   COPD (chronic obstructive pulmonary disease) (HCC)    Difficult airway    Due to limited oral opening. Elective glidescope used previously.   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 Elevated LA found to have SBO taken emergently to surgery for EX-LAP.   Interim History / Subjective:  Patient remained on vasopressor support with Levophed and phenylephrine She is intubated and sedated Remain afebrile Lactate has cleared  Objective:  Blood pressure 105/65, pulse 99, temperature 98.2 F (36.8 C), resp. rate (!) 24, height 5\' 7"  (1.702 m), weight 59 kg, SpO2 97%.    Vent Mode: PRVC FiO2 (%):  [40 %-100 %] 40 % Set Rate:  [18 bmp-24 bmp] 24 bmp Vt Set:  [490 mL] 490 mL PEEP:  [5 cmH20] 5 cmH20 Plateau Pressure:  [17 cmH20-20 cmH20] 18 cmH20   Intake/Output Summary (Last 24 hours) at 11/12/2023 0957 Last data filed at 11/12/2023 0900 Gross per 24 hour  Intake 6195.81 ml  Output 2615 ml  Net 3580.81 ml   Filed Weights   11/11/23 1400 11/12/23 0500  Weight: 54.4 kg 59 kg   Physical Examination: General: Crtitically ill-appearing elderly female, orally intubated HEENT: [place]/AT, eyes anicteric.  ETT and OGT in place Neuro: Sedated, not following commands.  Eyes are closed.  Pupils 3 mm bilateral reactive to light Chest: Coarse breath sounds, no wheezes or rhonchi Heart: Irregularly irregular, no murmurs or gallops Abdomen: Open abdomen with wound VAC in place, absent bowel sounds Skin: No rash  Labs and images reviewed  Resolved Hospital Problem List:  Assessment & Plan:  Septic shock due to acute peritonitis Lactic Acidosis, resolved Continue vasopressor support with Levophed, titrate off phenylephrine Will give 1 L of IV fluid with LR Continue antibiotics with Zosyn Follow-up cultures Lactate has trended down  High grade SBO with bowel ischemia S/p EX- lap/ LOA 3/4  intraoperative course  was notable for free fluid on entry, with closed loop bowel obstruction noted secondary to tight band adhesion. Patient  left in discontinuity/ anastomosis not completed with open abdomen and plan for second look in 24-48 hrs.  General Surgery is following ABThera and  wound VAC in place, draining serous fluid etc. Continue NG tube on suction  Acute Hypoxemic Respiratory Failure  COPD, not in exacerbation OSA Continue lung protective ventilation VAP prevention bundle in place PAD protocol with fentanyl and propofol with RASS goal -1 FiO2 was titrated down to 40% and PEEP of 5 Not ready for spontaneous breathing trial as patient is going back to the OR probably tomorrow  Acute kidney injury due to ischemic ATN from shock Acute metabolic acidosis Monitor intake and output Avoid nephrotoxic agent Serum creatinine trended up to 1.2 Will give 1 L of IV fluid Will give 100 mEq of sodium bicarbonate  Paroxysmal Atrial Fibrillation likely in the setting of critical illness Patient has new diagnosis of paroxysmal A-fib Remain in A-fib currently Not a candidate for anticoagulation for now Continue telemetry monitoring Closely monitor electrolytes  GERD  Continue PPI   Lung cancer   Adenocarcinoma,of RLL diagnosed 05/2023. Followed previously by Dr. Tonia Brooms.  Outpatient pulmonary f/u and age appropriate screenings.    Best Practice: (right click and "Reselect all SmartList Selections" daily)   Diet/type: NPO DVT prophylaxis: SCD GI prophylaxis: PPI Lines: Arterial Line and PICC line Foley:  Yes, and it is still needed Code Status:  full code Last date of multidisciplinary goals of care discussion: Pending Labs:  CBC: Recent Labs  Lab 11/11/23 1012 11/11/23 1143 11/11/23 1316 11/11/23 1710 11/11/23 1758 11/11/23 1801 11/12/23 0258  WBC 11.1*  --   --   --   --  5.3 3.9*  HGB 16.2*   < > 16.3* 13.6 13.3 12.9 12.9  HCT 52.5*   < > 48.0* 40.0 39.0 40.7 40.3  MCV 97.9  --   --   --   --  94.4 94.8  PLT 313  --   --   --   --  225 232   < > = values in this interval not displayed.   Basic  Metabolic Panel: Recent Labs  Lab 11/11/23 1127 11/11/23 1143 11/11/23 1316 11/11/23 1710 11/11/23 1758 11/11/23 1801 11/12/23 0250  NA 139 135 138 139 139 137 138  K 4.0 4.6 3.6 3.9 3.4* 3.5 3.9  CL 100 110  --   --   --  108 106  CO2 19*  --   --   --   --  19* 18*  GLUCOSE 198* 188*  --   --   --  114* 164*  BUN 29* 51*  --   --   --  34* 43*  CREATININE 0.96 0.80  --   --   --  0.92 1.27*  CALCIUM 11.2*  --   --   --   --  8.4* 8.5*  MG  --   --   --   --   --  1.7 1.6*  PHOS  --   --   --   --   --   --  4.5   GFR: Estimated Creatinine Clearance: 34 mL/min (A) (by C-G formula based on SCr of 1.27 mg/dL (H)). Recent Labs  Lab 11/11/23 1012 11/11/23 1132 11/11/23 1316 11/11/23 1801 11/12/23 0258  WBC 11.1*  --   --  5.3 3.9*  LATICACIDVEN  --  4.2* 2.5* 1.8  --    Liver Function Tests: Recent Labs  Lab 11/11/23 1127 11/12/23 0250  AST 46* 40  ALT 35 25  ALKPHOS 125 60  BILITOT 0.8 0.6  PROT 7.4 4.6*  ALBUMIN 3.7 2.4*   Recent Labs  Lab 11/11/23 1127  LIPASE 44   No results for input(s): "AMMONIA" in the last 168 hours.  ABG:    Component Value Date/Time   PHART 7.323 (L) 11/11/2023 1758   PCO2ART 39.4 11/11/2023 1758   PO2ART 211 (H) 11/11/2023 1758   HCO3 21.0 11/11/2023 1758   TCO2 22 11/11/2023 1758   ACIDBASEDEF 5.0 (H) 11/11/2023 1758   O2SAT 100 11/11/2023 1758   Coagulation Profile: Recent Labs  Lab 11/12/23 0250  INR 1.0    Cardiac Enzymes: No results for input(s): "CKTOTAL", "CKMB", "CKMBINDEX", "TROPONINI" in the last 168 hours.  HbA1C: No results found for: "HGBA1C"  CBG: Recent Labs  Lab 11/11/23 1709 11/11/23 1956 11/11/23 2349 11/12/23 0804  GLUCAP 88 116* 134* 106*      Critical care time:    The patient is critically ill due to Septic shock due to acute peritonitis/ High grade SBO.  Critical care was necessary to treat or prevent imminent or life-threatening deterioration.  Critical care was time spent  personally by me on the following activities: development of treatment plan with patient and/or surrogate as well as nursing, discussions with consultants, evaluation of patient's response to treatment, examination of patient, obtaining history from patient or surrogate, ordering and performing treatments and interventions, ordering and review of laboratory studies, ordering and review of radiographic studies, pulse oximetry, re-evaluation of patient's condition and participation in multidisciplinary rounds.   During this encounter critical care time was devoted to patient care services described in this note for 42 minutes.     Cheri Fowler, MD [facility] See Amion for pager If no response to pager, please call [phone] until 7pm After 7pm, Please call E-link [phone]

## 2023-11-13 ENCOUNTER — Inpatient Hospital Stay (HOSPITAL_COMMUNITY)

## 2023-11-13 ENCOUNTER — Other Ambulatory Visit: Payer: Self-pay

## 2023-11-13 ENCOUNTER — Inpatient Hospital Stay (HOSPITAL_COMMUNITY): Payer: Self-pay | Admitting: Certified Registered Nurse Anesthetist

## 2023-11-13 ENCOUNTER — Encounter (HOSPITAL_COMMUNITY)
Admission: EM | Disposition: A | Discharge status: Nursing Home (Includes ICF) | Payer: Self-pay | Source: Home / Self Care | Attending: Pulmonary Disease

## 2023-11-13 DIAGNOSIS — J449 Chronic obstructive pulmonary disease, unspecified: Secondary | ICD-10-CM | POA: Diagnosis not present

## 2023-11-13 DIAGNOSIS — I4891 Unspecified atrial fibrillation: Secondary | ICD-10-CM | POA: Diagnosis not present

## 2023-11-13 DIAGNOSIS — I5031 Acute diastolic (congestive) heart failure: Secondary | ICD-10-CM

## 2023-11-13 DIAGNOSIS — A419 Sepsis, unspecified organism: Secondary | ICD-10-CM | POA: Diagnosis not present

## 2023-11-13 DIAGNOSIS — J9601 Acute respiratory failure with hypoxia: Secondary | ICD-10-CM | POA: Diagnosis not present

## 2023-11-13 DIAGNOSIS — K56609 Unspecified intestinal obstruction, unspecified as to partial versus complete obstruction: Secondary | ICD-10-CM | POA: Diagnosis not present

## 2023-11-13 DIAGNOSIS — R6521 Severe sepsis with septic shock: Secondary | ICD-10-CM | POA: Diagnosis not present

## 2023-11-13 LAB — POCT I-STAT 7, (LYTES, BLD GAS, ICA,H+H)
Acid-base deficit: 3 mmol/L — ABNORMAL HIGH (ref 0.0–2.0)
Acid-base deficit: 3 mmol/L — ABNORMAL HIGH (ref 0.0–2.0)
Bicarbonate: 21.7 mmol/L (ref 20.0–28.0)
Bicarbonate: 23 mmol/L (ref 20.0–28.0)
Calcium, Ion: 1.06 mmol/L — ABNORMAL LOW (ref 1.15–1.40)
Calcium, Ion: 1.06 mmol/L — ABNORMAL LOW (ref 1.15–1.40)
HCT: 38 % (ref 36.0–46.0)
HCT: 33 % — ABNORMAL LOW (ref 36.0–46.0)
Hemoglobin: 12.9 g/dL (ref 12.0–15.0)
Hemoglobin: 11.2 g/dL — ABNORMAL LOW (ref 12.0–15.0)
O2 Saturation: 99 %
O2 Saturation: 100 %
Patient temperature: 37.3
Potassium: 4 mmol/L (ref 3.5–5.1)
Potassium: 4.2 mmol/L (ref 3.5–5.1)
Sodium: 130 mmol/L — ABNORMAL LOW (ref 135–145)
Sodium: 130 mmol/L — ABNORMAL LOW (ref 135–145)
TCO2: 23 mmol/L (ref 22–32)
TCO2: 24 mmol/L (ref 22–32)
pCO2 arterial: 38.1 mmHg (ref 32–48)
pCO2 arterial: 45 mmHg (ref 32–48)
pH, Arterial: 7.365 (ref 7.35–7.45)
pH, Arterial: 7.315 — ABNORMAL LOW (ref 7.35–7.45)
pO2, Arterial: 132 mmHg — ABNORMAL HIGH (ref 83–108)
pO2, Arterial: 262 mmHg — ABNORMAL HIGH (ref 83–108)

## 2023-11-13 LAB — CBC
HCT: 32.2 % — ABNORMAL LOW (ref 36.0–46.0)
Hemoglobin: 10.7 g/dL — ABNORMAL LOW (ref 12.0–15.0)
MCH: 30.1 pg (ref 26.0–34.0)
MCHC: 33.2 g/dL (ref 30.0–36.0)
MCV: 90.4 fL (ref 80.0–100.0)
Platelets: 121 10*3/uL — ABNORMAL LOW (ref 150–400)
RBC: 3.56 MIL/uL — ABNORMAL LOW (ref 3.87–5.11)
RDW: 17.1 % — ABNORMAL HIGH (ref 11.5–15.5)
WBC: 3.3 10*3/uL — ABNORMAL LOW (ref 4.0–10.5)
nRBC: 2.4 % — ABNORMAL HIGH (ref 0.0–0.2)

## 2023-11-13 LAB — ECHOCARDIOGRAM COMPLETE
AR max vel: 2.71 cm2
AV Area VTI: 2.47 cm2
AV Area mean vel: 2.27 cm2
AV Mean grad: 1 mmHg
AV Peak grad: 2.4 mmHg
Ao pk vel: 0.78 m/s
Area-P 1/2: 2.19 cm2
Height: 67 in
MV VTI: 1.91 cm2
S' Lateral: 2.5 cm
Single Plane A4C EF: 69.2 %
Weight: 2081.14 [oz_av]

## 2023-11-13 LAB — BASIC METABOLIC PANEL
Anion gap: 10 (ref 5–15)
BUN: 49 mg/dL — ABNORMAL HIGH (ref 8–23)
CO2: 19 mmol/L — ABNORMAL LOW (ref 22–32)
Calcium: 7 mg/dL — ABNORMAL LOW (ref 8.9–10.3)
Chloride: 100 mmol/L (ref 98–111)
Creatinine, Ser: 1.86 mg/dL — ABNORMAL HIGH (ref 0.44–1.00)
GFR, Estimated: 27 mL/min — ABNORMAL LOW (ref >60)
Glucose, Bld: 117 mg/dL — ABNORMAL HIGH (ref 70–99)
Potassium: 3.9 mmol/L (ref 3.5–5.1)
Sodium: 129 mmol/L — ABNORMAL LOW (ref 135–145)

## 2023-11-13 LAB — GLUCOSE, CAPILLARY: Glucose-Capillary: 119 mg/dL — ABNORMAL HIGH (ref 70–99)

## 2023-11-13 LAB — COMPREHENSIVE METABOLIC PANEL
ALT: 25 U/L (ref 0–44)
AST: 36 U/L (ref 15–41)
Albumin: 1.6 g/dL — ABNORMAL LOW (ref 3.5–5.0)
Alkaline Phosphatase: 46 U/L (ref 38–126)
Anion gap: 10 (ref 5–15)
BUN: 43 mg/dL — ABNORMAL HIGH (ref 8–23)
CO2: 21 mmol/L — ABNORMAL LOW (ref 22–32)
Calcium: 6.9 mg/dL — ABNORMAL LOW (ref 8.9–10.3)
Chloride: 91 mmol/L — ABNORMAL LOW (ref 98–111)
Creatinine, Ser: 1.67 mg/dL — ABNORMAL HIGH (ref 0.44–1.00)
GFR, Estimated: 31 mL/min — ABNORMAL LOW (ref >60)
Glucose, Bld: 400 mg/dL — ABNORMAL HIGH (ref 70–99)
Potassium: 3.9 mmol/L (ref 3.5–5.1)
Sodium: 122 mmol/L — ABNORMAL LOW (ref 135–145)
Total Bilirubin: 0.7 mg/dL (ref 0.0–1.2)
Total Protein: 4 g/dL — ABNORMAL LOW (ref 6.5–8.1)

## 2023-11-13 LAB — CG4 I-STAT (LACTIC ACID): Lactic Acid, Venous: 2.6 mmol/L (ref 0.5–1.9)

## 2023-11-13 LAB — SURGICAL PATHOLOGY

## 2023-11-13 LAB — MAGNESIUM
Magnesium: 2.2 mg/dL (ref 1.7–2.4)
Magnesium: 2.2 mg/dL (ref 1.7–2.4)

## 2023-11-13 SURGERY — LAPAROTOMY, EXPLORATORY
Anesthesia: General | Site: Abdomen

## 2023-11-13 MED ORDER — PROPOFOL 10 MG/ML IV BOLUS
INTRAVENOUS | Status: AC
  Filled 2023-11-13: qty 20

## 2023-11-13 MED ORDER — PERFLUTREN LIPID MICROSPHERE
1.0000 mL | INTRAVENOUS | Status: AC | PRN
  Administered 2023-11-13: 2 mL via INTRAVENOUS

## 2023-11-13 MED ORDER — LACTATED RINGERS IV BOLUS
1000.0000 mL | Freq: Once | INTRAVENOUS | Status: AC
  Administered 2023-11-13: 1000 mL via INTRAVENOUS

## 2023-11-13 MED ORDER — FENTANYL CITRATE (PF) 250 MCG/5ML IJ SOLN
INTRAMUSCULAR | Status: DC | PRN
Start: 2023-11-13 — End: 2023-11-13
  Administered 2023-11-13: 50 ug via INTRAVENOUS

## 2023-11-13 MED ORDER — LACTATED RINGERS IV BOLUS
2000.0000 mL | Freq: Once | INTRAVENOUS | Status: AC
  Administered 2023-11-13: 2000 mL via INTRAVENOUS

## 2023-11-13 MED ORDER — LACTATED RINGERS IV SOLN: INTRAVENOUS | Status: DC | PRN

## 2023-11-13 MED ORDER — VASOPRESSIN 20 UNITS/100 ML INFUSION FOR SHOCK
0.0000 [IU]/min | INTRAVENOUS | Status: DC
  Administered 2023-11-13 – 2023-11-15 (×5): 0.03 [IU]/min via INTRAVENOUS
  Filled 2023-11-13 (×5): qty 100

## 2023-11-13 MED ORDER — 0.9 % SODIUM CHLORIDE (POUR BTL) OPTIME
TOPICAL | Status: DC | PRN
  Administered 2023-11-13: 1000 mL

## 2023-11-13 MED ORDER — ROCURONIUM BROMIDE 10 MG/ML (PF) SYRINGE
PREFILLED_SYRINGE | INTRAVENOUS | Status: DC | PRN
  Administered 2023-11-13: 50 mg via INTRAVENOUS

## 2023-11-13 MED ORDER — FENTANYL CITRATE (PF) 250 MCG/5ML IJ SOLN
INTRAMUSCULAR | Status: AC
  Filled 2023-11-13: qty 5

## 2023-11-13 SURGICAL SUPPLY — 42 items
BAG COUNTER SPONGE SURGICOUNT (BAG) ×1 IMPLANT
BLADE CLIPPER SURG (BLADE) IMPLANT
CANISTER SUCT 3000ML PPV (MISCELLANEOUS) ×1 IMPLANT
CHLORAPREP W/TINT 26 (MISCELLANEOUS) ×1 IMPLANT
COVER SURGICAL LIGHT HANDLE (MISCELLANEOUS) ×1 IMPLANT
DRAPE LAPAROSCOPIC ABDOMINAL (DRAPES) ×1 IMPLANT
DRAPE WARM FLUID 44X44 (DRAPES) ×1 IMPLANT
DRSG OPSITE POSTOP 4X10 (GAUZE/BANDAGES/DRESSINGS) IMPLANT
DRSG OPSITE POSTOP 4X8 (GAUZE/BANDAGES/DRESSINGS) IMPLANT
DRSG TELFA 3X8 NADH STRL (GAUZE/BANDAGES/DRESSINGS) IMPLANT
ELECT BLADE 6.5 EXT (BLADE) IMPLANT
ELECT CAUTERY BLADE 6.4 (BLADE) IMPLANT
ELECT REM PT RETURN 9FT ADLT (ELECTROSURGICAL) ×1 IMPLANT
ELECTRODE REM PT RTRN 9FT ADLT (ELECTROSURGICAL) ×1 IMPLANT
GLOVE BIO SURGEON STRL SZ7 (GLOVE) ×1 IMPLANT
GLOVE BIOGEL PI IND STRL 7.5 (GLOVE) ×1 IMPLANT
GOWN STRL REUS W/ TWL LRG LVL3 (GOWN DISPOSABLE) ×2 IMPLANT
HANDLE SUCTION POOLE (INSTRUMENTS) ×1 IMPLANT
KIT BASIN OR (CUSTOM PROCEDURE TRAY) ×1 IMPLANT
KIT OSTOMY DRAINABLE 2.75 STR (WOUND CARE) IMPLANT
KIT TURNOVER KIT B (KITS) ×1 IMPLANT
LIGASURE IMPACT 36 18CM CVD LR (INSTRUMENTS) IMPLANT
NS IRRIG 1000ML POUR BTL (IV SOLUTION) ×2 IMPLANT
PACK GENERAL/GYN (CUSTOM PROCEDURE TRAY) ×1 IMPLANT
PAD ARMBOARD 7.5X6 YLW CONV (MISCELLANEOUS) ×1 IMPLANT
PENCIL SMOKE EVACUATOR (MISCELLANEOUS) ×1 IMPLANT
SPECIMEN JAR LARGE (MISCELLANEOUS) IMPLANT
SPONGE T-LAP 18X18 ~~LOC~~+RFID (SPONGE) IMPLANT
STAPLER PROXIMATE 75MM BLUE (STAPLE) IMPLANT
STAPLER SKIN PROX 35W (STAPLE) IMPLANT
STAPLER VISISTAT 35W (STAPLE) ×1 IMPLANT
SUCTION POOLE HANDLE (INSTRUMENTS) ×1 IMPLANT
SUT PDS AB 1 TP1 96 (SUTURE) ×2 IMPLANT
SUT SILK 2 0 SH CR/8 (SUTURE) ×1 IMPLANT
SUT SILK 2-0 18XBRD TIE 12 (SUTURE) ×1 IMPLANT
SUT SILK 3 0 SH CR/8 (SUTURE) ×1 IMPLANT
SUT SILK 3-0 18XBRD TIE 12 (SUTURE) ×1 IMPLANT
SUT VIC AB 3-0 SH 27X BRD (SUTURE) IMPLANT
SUT VIC AB 3-0 SH 8-18 (SUTURE) IMPLANT
TOWEL GREEN STERILE (TOWEL DISPOSABLE) ×1 IMPLANT
TRAY FOLEY MTR SLVR 16FR STAT (SET/KITS/TRAYS/PACK) ×1 IMPLANT
YANKAUER SUCT BULB TIP NO VENT (SUCTIONS) IMPLANT

## 2023-11-13 NOTE — Transfer of Care (Addendum)
 Immediate Anesthesia Transfer of Care Note  Patient: Jenny White  Procedure(s) Performed: LAPAROTOMY, EXPLORATORY (Abdomen)  Patient Location: SICU  Anesthesia Type:General  Level of Consciousness: Patient remains intubated per anesthesia plan  Airway & Oxygen Therapy: Patient remains intubated per anesthesia plan and Patient placed on Ventilator (see vital sign flow sheet for setting)  Post-op Assessment: Report given to RN and Post -op Vital signs reviewed and stable  Post vital signs: Reviewed and stable  Last Vitals:  Vitals Value Taken Time  BP 138/58 11/10/23  1140  Temp    Pulse 92 11/13/23 1137  Resp 28 11/13/23 1137  SpO2 99 % 11/13/23 1137    Last Pain:  Vitals:   11/13/23 0800  TempSrc: Esophageal  PainSc:       Patients Stated Pain Goal: 0 (11/11/23 2040)  Complications: No notable events documented.

## 2023-11-13 NOTE — Op Note (Signed)
 Preoperative diagnosis: s/p elap for closed loop sbo with sbr, open abdomen Postoperative diagnosis: further ischemic bowel Procedure: Reopening of recent exploratory laparotomy Small bowel resection End ileostomy Abdominal wall closure Surgeon: Dr Harden Mo Asst: Leary Roca, PA-C EBL: < 50 cc Anesthesia: general Specimens: small bowel to pathology Complications hypotension requiring vac placement Dispo ICU critical   Indications: This is 78 year old female multiple medical problems who has a prior splenectomy.  She presented with tachycardia, peritonitis on examination, elevated lactate and a CT scan that showed a small bowel obstruction with associated mesenteric edema and some fluid.  I took her to OR about 36 hours ago.  I did sbr and abthera due to her hypotension. She has remained on pressors and lactic acid still elevated. We discussed returning to the OR today   Procedure: After informed consent was obtained from her family she was taken to the operating room.  She was already given antibiotics.  SCDs were in place.  She was placed under general anesthesia without complication.  She was prepped and draped in a standard sterile surgical fashion.  A surgical timeout was then performed.  I removed her VAC.  I then reexplored her abdomen.  Just proximal to where I resected before she had a small segment of ischemic bowel that was not perforated.  The remainder of her small bowel and colon were all very healthy.  I did the GIA stapler to divide this proximal to the ischemic bowel.  I used a LigaSure to come across the mesentery.  She also had about 5 cm of terminal ileum left.  I dissected to the ileocecal valve and I resected the small portion of terminal ileum as well dissolved with the office a specimen.  I given the exam and the rest of the bowel this was all healthy.  I then picked a spot on the right side of the abdomen and removed a paddle of skin in the core of fat.  I made a  cruciate incision in the fascia.  I split the muscle and into the peritoneum.  I brought the terminal ileum through this.  Due to her hypotension and clinical course I thought it would be unsafe to make an anastomosis at this point in time.  I then closed her fascia with #1 PDS.  I did stapled the skin shut as there was no real contamination during the case.  I placed some Telfa wicks in this.  We then matured the ileostomy with 3-0 Vicryl.  I was able to pass my finger through the fascia.  This appeared viable.  An appliance was placed.  She will be transferred back to the ICU.

## 2023-11-13 NOTE — Progress Notes (Signed)
 NAME:  Jenny White, MRN:  [id number], DOB:  19-Jul-1946, LOS: 2 ADMISSION DATE:  11/11/2023 CONSULTATION DATE:  11/11/2023 REFERRING MD:  Dwain Sarna - CCS CHIEF COMPLAINT: Shock  History of Present Illness:  Jenny White 78 year old woman with a pmhx significant for COPD, GERD, nephrolithiasis , HTN, OSA, Lung cancer ( Adenocarcinoma, diagnosed 05/2023) who presented to the emergency room with complaints of abdominal pain with N/V for the past week. She states the abdominal  pain got worsened after dinner. She reported experiencing nausea and  vomiting this morning.   In the ED  her labs were significant for a lactate 4.1 After fluid resuscitation, Lactate decreased to 2.5. CT scan showed high grade small bowel obstruction with transition point in the right lower quadrant along the distal ileum. Some trace of ascites and mesenteric stranding.Concern for bowel wall ischemia. Broad spectrum antibiotics ( zosyn) was started, blood cultures pending.   Patient was taken to the ER emergently with CCS 3/4 for Ex-lap / LOA. Intraoperative course  was notable for free fluid on entry, with closed loop bowel obstruction noted secondary to tight band adhesion. Patient left in discontinuity/ anastomosis not completed with open abdomen and plan for second look in 24-48 hrs. Patient remained intubated and was transferred to ICU in critical condition.   PCCM consulted for ICU transfer.   Pertinent Medical History:   Past Medical History:  Diagnosis Date   Arthritis    in back   COPD (chronic obstructive pulmonary disease) (HCC)    Difficult airway    Due to limited oral opening. Elective glidescope used previously.   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 Elevated LA found to have SBO taken emergently to surgery for EX-LAP.   Interim History / Subjective:  Patient remained afebrile Has increasing vasopressor requirement, Levophed is at 32 mics now Started on vasopressin Making much urine Lactate remained at 2.5  Objective:  Blood pressure 109/70, pulse 92, temperature 98.8 F (37.1 C), resp. rate (!) 26, height 5\' 7"  (1.702 m), weight 59 kg, SpO2 98%.    Vent Mode: PRVC FiO2 (%):  [40 %] 40 % Set Rate:  [24 bmp-28 bmp] 28 bmp Vt Set:  [490 mL] 490 mL PEEP:  [5 cmH20] 5 cmH20 Plateau Pressure:  [17 cmH20-20 cmH20] 20 cmH20   Intake/Output Summary (Last 24 hours) at 11/13/2023 0929 Last data filed at 11/13/2023 0900 Gross per 24 hour  Intake 6348.14 ml  Output 1940 ml  Net 4408.14 ml   Filed Weights   11/11/23 1400 11/12/23 0500  Weight: 54.4 kg 59 kg   Physical Examination: General: Crtitically ill-appearing elderly female, orally intubated HEENT: [name]/AT, eyes anicteric.  ETT and OGT in place Neuro: Sedated, not following commands.  Eyes are closed.  Pupils 3 mm bilateral reactive to light Chest: Coarse breath sounds, no wheezes or rhonchi Heart: Regular rate and rhythm, no murmurs or gallops Abdomen: Firm, on ABThera with wound VAC in place Skin: No rash   Labs and images reviewed  Resolved  Hospital Problem List:    Assessment & Plan:  Septic shock due to acute peritonitis Lactic Acidosis Patient requiring increasing vasopressors, currently on Levophed at 32 mics and vasopressin Will give 2 L of IV fluid On antibiotics with Zosyn Cultures have been negative Lactate remained elevated 2.6 Hold off on stress dose steroid  High grade SBO with bowel ischemia S/p EX- lap/ LOA 3/4  intraoperative course  was notable for free fluid on entry, with closed loop bowel obstruction  noted secondary to tight band adhesion. Patient left in discontinuity/ anastomosis not completed with open abdomen Plan for going back to the OR for ileostomy creation General Surgery following Wound VAC is draining serous fluid NG tube on suction  Acute Hypoxemic Respiratory Failure  COPD, not in exacerbation OSA Continue lung protective ventilation VAP prevention bundle in place PAD protocol with fentanyl and propofol with RASS goal -1 FiO2 was titrated down to 40% and PEEP of 5 Not ready for spontaneous breathing trial as patient is going back to the OR today  Acute kidney injury due to ischemic ATN from shock Acute metabolic acidosis Monitor intake and output Avoid nephrotoxic agent Serum creatinine started trending up again Lactate remained elevated Monitor urine output Will give 2 L of IV fluid  Paroxysmal Atrial Fibrillation likely in the setting of critical illness Patient has new diagnosis of paroxysmal A-fib Converted to sinus rhythm Not a candidate for anticoagulation for now Continue telemetry monitoring Closely monitor electrolytes  GERD  Continue PPI   Lung cancer   Adenocarcinoma,of RLL diagnosed 05/2023. Followed previously by Dr. Tonia Brooms.  Outpatient pulmonary f/u and age appropriate screenings.    Best Practice: (right click and "Reselect all SmartList Selections" daily)   Diet/type: NPO DVT prophylaxis: SCD GI prophylaxis: PPI Lines: Arterial Line and PICC line Foley:  Yes, and it is still needed Code Status:  full code Last date of multidisciplinary goals of care discussion: 3/6: Patient's daughter was updated at bedside, decision was to continue full scope of care Labs:  CBC: Recent Labs  Lab 11/11/23 1012 11/11/23 1143 11/11/23 1801 11/12/23 0258 11/12/23 1020 11/12/23 1348 11/12/23 1635 11/13/23 0732  WBC 11.1*  --  5.3 3.9*  --  1.8*  --   --   HGB 16.2*   < > 12.9 12.9 13.6 12.3 12.2 12.9  HCT 52.5*   < > 40.7 40.3 40.0 38.0 36.0  38.0  MCV 97.9  --  94.4 94.8  --  92.7  --   --   PLT 313  --  225 232  --  197  --   --    < > = values in this interval not displayed.   Basic Metabolic Panel: Recent Labs  Lab 11/11/23 1801 11/12/23 0250 11/12/23 1020 11/12/23 1348 11/12/23 1635 11/12/23 2018 11/13/23 0439 11/13/23 0732  NA 137 138   < > 138 136 135 122* 130*  K 3.5 3.9   < > 3.8 3.6 3.8 3.9 4.0  CL 108 106  --  104  --  101 91*  --   CO2 19* 18*  --  23  --  21* 21*  --   GLUCOSE 114* 164*  --  155*  --  146* 400*  --   BUN 34* 43*  --  40*  --  44* 43*  --   CREATININE 0.92 1.27*  --  1.23*  --  1.36* 1.67*  --   CALCIUM 8.4* 8.5*  --  7.8*  --  7.6* 6.9*  --   MG 1.7 1.6*  --  2.8*  --  2.5* 2.2  --   PHOS  --  4.5  --   --   --   --   --   --    < > = values in this interval not displayed.   GFR: Estimated Creatinine Clearance: 25.9 mL/min (A) (by C-G formula based on SCr of 1.67 mg/dL (H)). Recent Labs  Lab 11/11/23 1012 11/11/23 1132 11/11/23 1316 11/11/23 1801 11/12/23 0258 11/12/23 1348 11/12/23 1635 11/13/23 0728  WBC 11.1*  --   --  5.3 3.9* 1.8*  --   --   LATICACIDVEN  --    < > 2.5* 1.8  --   --  2.5* 2.6*   < > = values in this interval not displayed.   Liver Function Tests: Recent Labs  Lab 11/11/23 1127 11/12/23 0250 11/13/23 0439  AST 46* 40 36  ALT 35 25 25  ALKPHOS 125 60 46  BILITOT 0.8 0.6 0.7  PROT 7.4 4.6* 4.0*  ALBUMIN 3.7 2.4* 1.6*   Recent Labs  Lab 11/11/23 1127  LIPASE 44   No results for input(s): "AMMONIA" in the last 168 hours.  ABG:    Component Value Date/Time   PHART 7.365 11/13/2023 0732   PCO2ART 38.1 11/13/2023 0732   PO2ART 132 (H) 11/13/2023 0732   HCO3 21.7 11/13/2023 0732   TCO2 23 11/13/2023 0732   ACIDBASEDEF 3.0 (H) 11/13/2023 0732   O2SAT 99 11/13/2023 0732   Coagulation Profile: Recent Labs  Lab 11/12/23 0250  INR 1.0    Cardiac Enzymes: No results for input(s): "CKTOTAL", "CKMB", "CKMBINDEX", "TROPONINI" in the last  168 hours.  HbA1C: No results found for: "HGBA1C"  CBG: Recent Labs  Lab 11/11/23 2349 11/12/23 0804 11/12/23 1129 11/12/23 1542 11/13/23 0727  GLUCAP 134* 106* 112* 128* 119*      Critical care time:    The patient is critically ill due to Septic shock due to acute peritonitis/ High grade SBO.  Critical care was necessary to treat or prevent imminent or life-threatening deterioration.  Critical care was time spent personally by me on the following activities: development of treatment plan with patient and/or surrogate as well as nursing, discussions with consultants, evaluation of patient's response to treatment, examination of patient, obtaining history from patient or surrogate, ordering and performing treatments and interventions, ordering and review of laboratory studies, ordering and review of radiographic studies, pulse oximetry, re-evaluation of patient's condition and participation in multidisciplinary rounds.   During this encounter critical care time was devoted to patient care services described in this note for 39 minutes.     Cheri Fowler, MD [facility] See Amion for pager If no response to pager, please call [phone] until 7pm After 7pm, Please call E-link [phone]

## 2023-11-13 NOTE — Progress Notes (Signed)
  Echocardiogram 2D Echocardiogram has been performed.  Ocie Doyne RDCS 11/13/2023, 10:27 AM

## 2023-11-13 NOTE — Progress Notes (Signed)
 2 Days Post-Op   Subjective/Chief Complaint: Intubated sedated   Objective: Vital signs in last 24 hours: Temp:  [96.8 F (36 C)-100 F (37.8 C)] 99.1 F (37.3 C) (03/06 0719) Pulse Rate:  [78-139] 91 (03/06 0719) Resp:  [21-28] 28 (03/06 0719) BP: (55-142)/(39-91) 113/62 (03/06 0700) SpO2:  [94 %-99 %] 96 % (03/06 0719) Arterial Line BP: (65-173)/(41-67) 113/50 (03/06 0719) FiO2 (%):  [40 %] 40 % (03/06 0405) Last BM Date :  (PTA)  Intake/Output from previous day: 03/05 0701 - 03/06 0700 In: 5284.1 [I.V.:3734.8; IV Piggyback:2931.6] Out: 1840 [Urine:760; Emesis/NG output:830; Drains:250] Intake/Output this shift: No intake/output data recorded.  General nad Cv tachcardic Ab vac in place draining  Lab Results:  Recent Labs    11/12/23 0258 11/12/23 1020 11/12/23 1348 11/12/23 1635 11/13/23 0732  WBC 3.9*  --  1.8*  --   --   HGB 12.9   < > 12.3 12.2 12.9  HCT 40.3   < > 38.0 36.0 38.0  PLT 232  --  197  --   --    < > = values in this interval not displayed.   BMET Recent Labs    11/12/23 2018 11/13/23 0439 11/13/23 0732  NA 135 122* 130*  K 3.8 3.9 4.0  CL 101 91*  --   CO2 21* 21*  --   GLUCOSE 146* 400*  --   BUN 44* 43*  --   CREATININE 1.36* 1.67*  --   CALCIUM 7.6* 6.9*  --    PT/INR Recent Labs    11/12/23 0250  LABPROT 13.5  INR 1.0   ABG Recent Labs    11/12/23 1635 11/13/23 0732  PHART 7.325* 7.365  HCO3 22.9 21.7    Studies/Results: DG Chest Port 1 View Result Date: 11/12/2023 CLINICAL DATA:  Endotracheal tube. EXAM: PORTABLE CHEST 1 VIEW COMPARISON:  November 11, 2023. FINDINGS: The heart size and mediastinal contours are within normal limits. Endotracheal and nasogastric tubes are unchanged in position. Left lung is clear. Minimal right basilar subsegmental atelectasis or scarring is noted. The visualized skeletal structures are unremarkable. IMPRESSION: Stable support apparatus. Minimal right basilar subsegmental atelectasis or  scarring. Electronically Signed   By: Lupita Raider M.D.   On: 11/12/2023 09:48   [place] EKG SITE RITE Result Date: 11/12/2023 If Site Rite image not attached, placement could not be confirmed due to current cardiac rhythm.  DG CHEST PORT 1 VIEW Result Date: 11/11/2023 CLINICAL DATA:  Intubated EXAM: PORTABLE CHEST 1 VIEW COMPARISON:  06/09/2023, CT 11/11/2023, PET CT 09/08/2023 FINDINGS: Endotracheal tube tip is about 4.4 cm superior to the carina. Enteric tube tip below the diaphragm but incompletely visualized. Emphysema. Vague density with fiducial marker in the left upper lobe. Known peripheral right lung base mass is evident as vague right basilar opacity. Normal cardiac size with aortic atherosclerosis. IMPRESSION: 1. Endotracheal tube tip about 4.4 cm superior to carina 2. Emphysema. Vague right lung base opacity corresponding to known mass/consolidation in the region. Similar vague left upper lobe opacity with fiducial marker Electronically Signed   By: Jasmine Pang M.D.   On: 11/11/2023 19:36   DG Abdomen 1 View Result Date: 11/11/2023 CLINICAL DATA:  Status post nasogastric tube placement. Emesis and nausea. Non-small-cell lung cancer. 11/11/2023 EXAM: ABDOMEN - 1 VIEW COMPARISON:  CT chest, abdomen, and pelvis 11/11/2023 FINDINGS: New enteric tube descends below the diaphragm and curls along the greater curvature of the stomach with the tip overlying the right  upper quadrant, likely within the distal stomach. Right upper quadrant cholecystectomy clips. Contrast excretion by the bilateral kidneys from IV contrast administered for CT 1.5 hours earlier. Fluid-filled bowel loops as on prior CT. The dilated loops of bowel and CT are likely grossly similar to prior, with left lower quadrant bowel loop measuring up to 3.9 cm in caliber. Kyphoplasty cement is again seen within the L2 vertebral body. L3-4 bilateral transpedicular rod and screw fusion hardware with associated intervertebral disc spacer.  IMPRESSION: 1. New enteric tube tip overlies the right upper quadrant, likely within the distal stomach. 2. Dilated fluid-filled loops of bowel as on prior CT. Electronically Signed   By: Neita Garnet M.D.   On: 11/11/2023 15:58   CT Angio Chest/Abd/Pel for Dissection W and/or W/WO Result Date: 11/11/2023 CLINICAL DATA:  Non-small-cell lung cancer. Abdominal pain nausea vomiting. * Tracking Code: BO * EXAM: CT ANGIOGRAPHY CHEST, ABDOMEN AND PELVIS TECHNIQUE: Non-contrast CT of the chest was initially obtained. Multidetector CT imaging through the chest, abdomen and pelvis was performed using the standard protocol during bolus administration of intravenous contrast. Multiplanar reconstructed images and MIPs were obtained and reviewed to evaluate the vascular anatomy. RADIATION DOSE REDUCTION: This exam was performed according to the departmental dose-optimization program which includes automated exposure control, adjustment of the mA and/or kV according to patient size and/or use of iterative reconstruction technique. CONTRAST:  OMNIPAQUE IOHEXOL 350 MG/ML SOLN COMPARISON:  Chest CT with contrast 10/15/2023. PET-CT 09/08/2023. Older exams as well. FINDINGS: CTA CHEST FINDINGS Cardiovascular: Aortic root has a diameter of 3.2 cm. The ascending aorta at the level of the main pulmonary artery has a diameter of 3.7 x 3.4 cm. The descending thoracic aorta same level measures 2.6 x 2.5 cm. The distal aortic arch has a diameter approaching 3.0 cm. There is mild partially calcified atherosclerotic plaque. Some of the plaque along the descending thoracic aorta is irregular. Slight plaque also along the origin of the great vessels. No abnormal high density along the course of the thoracic aorta on the noncontrast dataset. No dissection or aneurysm formation. Coronary artery calcifications are seen. Tiny pericardial effusion. Heart is nonenlarged. Mediastinum/Nodes: Patulous fluid-filled esophagus. Slight wall  thickening of the distal esophagus. There is an enlarged heterogeneous thyroid gland. This was hypermetabolic on PET-CT scan. Please correlate with known history. No specific abnormal lymph node enlargement identified in the axillary regions, hilum or mediastinum. Lungs/Pleura: Emphysematous lung changes identified. Bilateral apical pleural thickening. There is some left upper lobe anterior scarring and bronchiectasis, unchanged from previous. Areas of bronchial wall thickening identified along lower lungs. No pneumothorax, effusion or edema. Areas of mucous plugging along the left lower lobe inferolateral is again noted as well. The focal ill-defined opacity along the dependent right lower lobe is again seen. Previously this was measured at 3.5 x 3.5 cm and today when measured in similar fashion measures 2.5 x 4.3 cm. Overall similar when adjusted for variances in technique. No new dominant lung lesion. Some tiny nodules identified as well such as left lower lobe anteriorly on series 7, image 117 which is unchanged measuring 4 mm. The central nodule left suprahilar with tissue marker is stable. Previous dimension of 7 mm and today on series 7, image 34 7 mm. Musculoskeletal: Osteopenia. Scattered degenerative changes. There are multiple compression deformities along the spine which are new including T6 and T12. The T12 level has some sclerosis. Please correlate with the time course of injury. Review of the MIP images confirms the  above findings. CTA ABDOMEN AND PELVIS FINDINGS VASCULAR Aorta: Partially calcified atherosclerotic plaque. No dissection or aneurysm formation. Maximal dimension of the inferior abdominal aorta of 2.2 cm. Overall there is moderate calcified plaque. Celiac: Mild disease along the origin. There is also calcified plaque along a atretic splenic artery. 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 SMA: Moderate atherosclerotic disease along the origin of the SMA with mild stenosis. More calcified plaque more  distally along the SMA. Renals: Single bowel main renal arteries with some mild calcified plaque. IMA: Grossly patent. Inflow: There is calcified moderate disease diffusely along the iliac vessels with only mild areas of stenosis suggested along the common and external iliacs. More moderate along the internal. Of note the common femoral and visualized femoral vessels have more significant disease. Veins: No obvious venous abnormality within the limitations of this arterial phase study. Review of the MIP images confirms the above findings. NON-VASCULAR Hepatobiliary: With the limits of the early phase of the arterial bolus, grossly the liver is preserved. Previous cholecystectomy with a dilated common duct but unchanged from prior. Pancreas: Unremarkable. No pancreatic ductal dilatation or surrounding inflammatory changes. 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 Please correlate with history. Adrenals/Urinary Tract: Stable thickening of the adrenal glands. Large upper pole left-sided Bosniak 1 renal cyst measuring 7 cm. A few smaller foci identified as well in each kidney, too small to characterize and poorly seen on this arterial phase only examination. No collecting system dilatation. The ureters have normal course and caliber extending down to the urinary bladder. The bladder is underdistended. Stomach/Bowel: Dilated fluid-filled stomach and small bowel diffusely with caliber change seen of the distal small bowel such as axial image 217 of series 6,. Please see coronal series 9, image 79 in the right lower quadrant. No pneumatosis, free air. Trace free fluid. The colon is relatively decompressed with scattered stool. There are some loops of small bowel extending anterior to the left side of the transverse colon. Evaluation of the bowel wall enhancement is somewhat more difficult as this is a true early arterial phase examination. Please correlate for any clinical evidence of ischemia. Again no pneumatosis.  Lymphatic: No specific abnormal lymph node enlargement identified in the abdomen and pelvis. Reproductive: Uterus and bilateral adnexa are unremarkable. Other: No free intra-air. Scattered mild free fluid. Slight mesenteric stranding. Mild mesenteric edema. Musculoskeletal: Degenerative changes seen of the spine and pelvis. Augmentation cement seen at L2. Fixation hardware posteriorly at L3-4. Associated streak artifact. Multilevel degenerative changes and stenosis. Critical Value/emergent results were called by telephone at the time of interpretation on 11/11/2023 at 12:40 pm to provider Alvira Monday, who verbally acknowledged these results. Review of the MIP images confirms the above findings. IMPRESSION: Scattered calcified as sclerotic plaque. Some areas of plaque are irregular. No dissection or aneurysm formation. There are areas of significant stenosis suggested particularly along the iliac vessels and femoral vessels at the edge of the imaging field. High-grade small-bowel obstruction is with transition point in the right lower quadrant along the distal ileum. There is some trace ascites and some mesenteric stranding. Evaluation the bowel wall in some areas is difficult due to the early phase of the arterial bolus. No pneumatosis. A component of bowel wall ischemia is difficult to completely exclude with this examination. Please correlate with clinical presentation. If needed short follow up could be considered. Distended esophagus with luminal fluid. This could relate to the bowel obstruction. Masslike parenchymal opacity again seen in the right lower lobe. Stable other small areas  of lung nodularity as well as the focal nodule left upper lobe with a fiduciary marker distant with known history of neoplasm. No developing new lymph node enlargement seen in the chest, abdomen and pelvis. New compression deformities along the thoracic spine at T12 and T6 compared to the study of 10/15/2023. Please correlate  with any specific history and known injury. Additional workup as clinically appropriate to assess for etiology and exclude a marrow replacement process. Thyroid goiter. Emphysematous lung changes. Electronically Signed   By: Karen Kays M.D.   On: 11/11/2023 12:49    Anti-infectives: Anti-infectives (From admission, onward)    Start     Dose/Rate Route Frequency Ordered Stop   11/11/23 2000  piperacillin-tazobactam (ZOSYN) IVPB 3.375 g        3.375 g 12.5 mL/hr over 240 Minutes Intravenous Every 8 hours 11/11/23 1233     11/11/23 1245  piperacillin-tazobactam (ZOSYN) IVPB 3.375 g        3.375 g 100 mL/hr over 30 Minutes Intravenous  Once 11/11/23 1233 11/11/23 1316       Assessment/Plan: POD 2 elap, sbr for sbo/ischemic bowel, abthera placement- MW -plan to return to OR today for likely ileostomy as still on pressors -continued resuscitation -ngt, npo -abx for five days postop -subcutaneous heparin, scds    Emelia Loron 11/13/2023

## 2023-11-13 NOTE — Anesthesia Postprocedure Evaluation (Signed)
 Anesthesia Post Note  Patient: Jenny White  Procedure(s) Performed: LAPAROTOMY, EXPLORATORY (Abdomen)     Patient location during evaluation: ICU Anesthesia Type: General Level of consciousness: sedated Pain management: pain level controlled Vital Signs Assessment: post-procedure vital signs reviewed and stable Respiratory status: patient remains intubated per anesthesia plan Cardiovascular status: stable (still on pressors) Postop Assessment: no apparent nausea or vomiting Anesthetic complications: no   No notable events documented.  Last Vitals:  Vitals:   11/13/23 1137 11/13/23 1215  BP:    Pulse: 92 100  Resp: (!) 28 (!) 28  Temp:  (!) 36.4 C  SpO2: 99% 98%    Last Pain:  Vitals:   11/13/23 0800  TempSrc: Esophageal  PainSc:                  Linton Rump

## 2023-11-13 NOTE — Progress Notes (Addendum)
 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 Wound vac removed  Pt remains on vent support, vasopressor requirements up overnight. Levophed up to 34 this AM, vasopressin started at 0.03 and levophed down to 12 Pt receiving fluid boluses today  NG with 830 mL in 24 hours UOP remains marginal, 760 mL in 24 hours, 235 mL thus far today. Labs and meds reviewed  Discussed nutrition poc with Dr. Merrily Pew this AM, considering possible TPN initiation tomorrow. RD recommends TPN initiation if unable to use enteral route in the next 24-48 hours. RD does not recommend enteral nutrition with current pressor requirements given GI surgery with ischemic bowel, high risk for recurrence  Romelle Starcher MS, RDN, LDN, CNSC Registered Dietitian 3 Clinical Nutrition RD Inpatient Contact Info in Amion

## 2023-11-14 ENCOUNTER — Inpatient Hospital Stay (HOSPITAL_COMMUNITY)

## 2023-11-14 ENCOUNTER — Encounter (HOSPITAL_COMMUNITY): Payer: Self-pay | Admitting: General Surgery

## 2023-11-14 DIAGNOSIS — A419 Sepsis, unspecified organism: Secondary | ICD-10-CM | POA: Diagnosis not present

## 2023-11-14 DIAGNOSIS — K56609 Unspecified intestinal obstruction, unspecified as to partial versus complete obstruction: Secondary | ICD-10-CM | POA: Diagnosis not present

## 2023-11-14 DIAGNOSIS — E871 Hypo-osmolality and hyponatremia: Secondary | ICD-10-CM

## 2023-11-14 DIAGNOSIS — R6521 Severe sepsis with septic shock: Secondary | ICD-10-CM | POA: Diagnosis not present

## 2023-11-14 DIAGNOSIS — J9601 Acute respiratory failure with hypoxia: Secondary | ICD-10-CM | POA: Diagnosis not present

## 2023-11-14 LAB — POCT I-STAT 7, (LYTES, BLD GAS, ICA,H+H)
Acid-base deficit: 2 mmol/L (ref 0.0–2.0)
Bicarbonate: 23.9 mmol/L (ref 20.0–28.0)
Calcium, Ion: 1.16 mmol/L (ref 1.15–1.40)
HCT: 31 % — ABNORMAL LOW (ref 36.0–46.0)
Hemoglobin: 10.5 g/dL — ABNORMAL LOW (ref 12.0–15.0)
O2 Saturation: 94 %
Patient temperature: 98.2
Potassium: 3.6 mmol/L (ref 3.5–5.1)
Sodium: 131 mmol/L — ABNORMAL LOW (ref 135–145)
TCO2: 25 mmol/L (ref 22–32)
pCO2 arterial: 44.8 mmHg (ref 32–48)
pH, Arterial: 7.334 — ABNORMAL LOW (ref 7.35–7.45)
pO2, Arterial: 77 mmHg — ABNORMAL LOW (ref 83–108)

## 2023-11-14 LAB — BASIC METABOLIC PANEL
Anion gap: 9 (ref 5–15)
BUN: 57 mg/dL — ABNORMAL HIGH (ref 8–23)
CO2: 21 mmol/L — ABNORMAL LOW (ref 22–32)
Calcium: 7.2 mg/dL — ABNORMAL LOW (ref 8.9–10.3)
Chloride: 98 mmol/L (ref 98–111)
Creatinine, Ser: 1.73 mg/dL — ABNORMAL HIGH (ref 0.44–1.00)
GFR, Estimated: 30 mL/min — ABNORMAL LOW (ref >60)
Glucose, Bld: 106 mg/dL — ABNORMAL HIGH (ref 70–99)
Potassium: 3.7 mmol/L (ref 3.5–5.1)
Sodium: 128 mmol/L — ABNORMAL LOW (ref 135–145)

## 2023-11-14 LAB — CBC
HCT: 31 % — ABNORMAL LOW (ref 36.0–46.0)
Hemoglobin: 10.6 g/dL — ABNORMAL LOW (ref 12.0–15.0)
MCH: 30.2 pg (ref 26.0–34.0)
MCHC: 34.2 g/dL (ref 30.0–36.0)
MCV: 88.3 fL (ref 80.0–100.0)
Platelets: 115 10*3/uL — ABNORMAL LOW (ref 150–400)
RBC: 3.51 MIL/uL — ABNORMAL LOW (ref 3.87–5.11)
RDW: 16.9 % — ABNORMAL HIGH (ref 11.5–15.5)
WBC: 5.9 10*3/uL (ref 4.0–10.5)
nRBC: 10.2 % — ABNORMAL HIGH (ref 0.0–0.2)

## 2023-11-14 LAB — GLUCOSE, CAPILLARY
Glucose-Capillary: 128 mg/dL — ABNORMAL HIGH (ref 70–99)
Glucose-Capillary: 67 mg/dL — ABNORMAL LOW (ref 70–99)
Glucose-Capillary: 97 mg/dL (ref 70–99)

## 2023-11-14 LAB — CG4 I-STAT (LACTIC ACID): Lactic Acid, Venous: 1.3 mmol/L (ref 0.5–1.9)

## 2023-11-14 LAB — HEMOGLOBIN A1C
Hgb A1c MFr Bld: 5.8 % — ABNORMAL HIGH (ref 4.8–5.6)
Mean Plasma Glucose: 119.76 mg/dL

## 2023-11-14 LAB — MAGNESIUM: Magnesium: 2.1 mg/dL (ref 1.7–2.4)

## 2023-11-14 LAB — POCT ACTIVATED CLOTTING TIME: Activated Clotting Time: 0 s

## 2023-11-14 MED ORDER — DEXMEDETOMIDINE HCL IN NACL 400 MCG/100ML IV SOLN
INTRAVENOUS | Status: AC
  Administered 2023-11-14: 0.4 ug/kg/h via INTRAVENOUS
  Filled 2023-11-14: qty 100

## 2023-11-14 MED ORDER — TRACE MINERALS CU-MN-SE-ZN 300-55-60-3000 MCG/ML IV SOLN
INTRAVENOUS | Status: AC
  Filled 2023-11-14: qty 285.6

## 2023-11-14 MED ORDER — CALCIUM GLUCONATE-NACL 2-0.675 GM/100ML-% IV SOLN
2.0000 g | Freq: Once | INTRAVENOUS | Status: AC
  Administered 2023-11-14: 2000 mg via INTRAVENOUS
  Filled 2023-11-14: qty 100

## 2023-11-14 MED ORDER — ETOMIDATE 2 MG/ML IV SOLN
INTRAVENOUS | Status: AC
  Administered 2023-11-14: 20 mg via INTRAVENOUS
  Filled 2023-11-14: qty 10

## 2023-11-14 MED ORDER — DEXMEDETOMIDINE HCL IN NACL 400 MCG/100ML IV SOLN
0.0000 ug/kg/h | INTRAVENOUS | Status: DC
  Administered 2023-11-15: 0.7 ug/kg/h via INTRAVENOUS
  Administered 2023-11-15: 0.4 ug/kg/h via INTRAVENOUS
  Filled 2023-11-14 (×2): qty 100

## 2023-11-14 MED ORDER — LACTATED RINGERS IV BOLUS: 1000.0000 mL | Freq: Once | INTRAVENOUS | Status: DC

## 2023-11-14 MED ORDER — ROCURONIUM BROMIDE 10 MG/ML (PF) SYRINGE: 100.0000 mg | PREFILLED_SYRINGE | Freq: Once | INTRAVENOUS | Status: AC

## 2023-11-14 MED ORDER — SODIUM CHLORIDE 0.9 % IV SOLN: INTRAVENOUS | Status: AC | PRN

## 2023-11-14 MED ORDER — ROCURONIUM BROMIDE 10 MG/ML (PF) SYRINGE
PREFILLED_SYRINGE | INTRAVENOUS | Status: AC
  Administered 2023-11-14: 100 mg via INTRAVENOUS
  Filled 2023-11-14: qty 10

## 2023-11-14 MED ORDER — ETOMIDATE 2 MG/ML IV SOLN: 20.0000 mg | Freq: Once | INTRAVENOUS | Status: AC

## 2023-11-14 MED ORDER — AMIODARONE IV BOLUS ONLY 150 MG/100ML
150.0000 mg | Freq: Once | INTRAVENOUS | Status: AC
  Administered 2023-11-14: 150 mg via INTRAVENOUS
  Filled 2023-11-14: qty 100

## 2023-11-14 MED ORDER — LACTATED RINGERS IV BOLUS
500.0000 mL | Freq: Once | INTRAVENOUS | Status: AC
  Administered 2023-11-14: 500 mL via INTRAVENOUS

## 2023-11-14 MED ORDER — INSULIN ASPART 100 UNIT/ML IJ SOLN
0.0000 [IU] | Freq: Four times a day (QID) | INTRAMUSCULAR | Status: DC
  Administered 2023-11-14 – 2023-11-17 (×6): 1 [IU] via SUBCUTANEOUS
  Administered 2023-11-17: 2 [IU] via SUBCUTANEOUS
  Administered 2023-11-18 – 2023-11-19 (×2): 1 [IU] via SUBCUTANEOUS

## 2023-11-14 MED ORDER — ALBUMIN HUMAN 5 % IV SOLN
12.5000 g | Freq: Once | INTRAVENOUS | Status: AC
  Administered 2023-11-14: 12.5 g via INTRAVENOUS

## 2023-11-14 MED ORDER — POTASSIUM CHLORIDE 10 MEQ/50ML IV SOLN: 10.0000 meq | INTRAVENOUS | Status: DC

## 2023-11-14 NOTE — TOC Initial Note (Signed)
 Transition of Care [facility]) - Initial/Assessment Note    Patient Details  Name: Jenny White MRN: [id number] Date of Birth: 07-13-1946  [facility] CM/SW Contact:    Gala Lewandowsky, RN Phone Number: 11/14/2023, 3:24 PM  Clinical Narrative: Patient presented for abdominal pain-POD-1 exploratory lap and extubated today. PTA patient was from home with son and daughter-in-law. Patient has DME rolling walker. Patient has PCP and she was getting to appointments without any issues. Case Manager will continue to follow for transition of care needs as the patient progresses.                    Expected Discharge Plan: Home w Home Health Services Barriers to Discharge: Continued Medical Work up  Expected Discharge Plan and Services   Discharge Planning Services: CM Consult Post Acute Care Choice: Home Health Living arrangements for the past 2 months: Single Family Home  Prior Living Arrangements/Services Living arrangements for the past 2 months: Single Family Home Lives with:: Adult Children Patient language and need for interpreter reviewed:: Yes Do you feel safe going back to the place where you live?: Yes      Need for Family Participation in Patient Care: Yes (Comment) Care giver support system in place?: Yes (comment) Current home services: DME (rolling walker) Criminal Activity/Legal Involvement Pertinent to Current Situation/Hospitalization: No - Comment as needed  Permission Sought/Granted Permission sought to share information with : Family Supports, Case Manager     Emotional Assessment Appearance:: Appears stated age       Alcohol / Substance Use: Not Applicable Psych Involvement: No (comment)  Admission diagnosis:  S/P exploratory laparotomy [Z98.890] Patient Active Problem List   Diagnosis Date Noted   S/P exploratory laparotomy 11/11/2023   Shock (HCC) 11/11/2023   Malignant neoplasm of lower lobe of right lung (HCC) 06/16/2023   Malignant  neoplasm of upper lobe of left lung (HCC) 06/16/2023   Lung nodules 05/07/2023   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   Lumbar stenosis 06/28/2020   PCP:  Danella Penton, MD Pharmacy:   [facility] [address], [place] - [address] [place] [address] Phone: [phone] Fax: [phone]  Social Drivers of Health (SDOH) Social History: SDOH Screenings   Food Insecurity: No Food Insecurity (11/11/2023)  Housing: Low Risk  (11/11/2023)  Transportation Needs: No Transportation Needs (11/11/2023)  Utilities: Not At Risk (11/11/2023)  Depression (PHQ2-9): Low Risk  (06/17/2023)  Financial Resource Strain: Low Risk  (11/06/2023)   Received from [facility]  Social Connections: Unknown (11/11/2023)  Tobacco Use: Medium Risk (11/11/2023)   Readmission Risk Interventions     No data to display

## 2023-11-14 NOTE — Progress Notes (Signed)
 1 Day Post-Op   Subjective/Chief Complaint: On levo/vaso, waking up with sedation decreased, a lot out of stoma   Objective: Vital signs in last 24 hours: Temp:  [97.5 F (36.4 C)-99.3 F (37.4 C)] 99 F (37.2 C) (03/07 0900) Pulse Rate:  [86-127] 127 (03/07 0900) Resp:  [0-33] 20 (03/07 0900) BP: (114)/(75) 114/75 (03/06 1000) SpO2:  [96 %-99 %] 96 % (03/07 0900) Arterial Line BP: (95-165)/(45-65) 149/65 (03/07 0900) FiO2 (%):  [40 %] 40 % (03/07 0900) Last BM Date :  (pta)  Intake/Output from previous day: 03/06 0701 - 03/07 0700 In: 5201.7 [I.V.:1958.4; IV Piggyback:3243.4] Out: 2135 [Urine:825; Emesis/NG output:350; Drains:100; Stool:650; Blood:10] Intake/Output this shift: Total I/O In: 113.2 [I.V.:85.5; IV Piggyback:27.8] Out: 200 [Urine:200]  Ab soft, liquid in ileostomy bag  Lab Results:  Recent Labs    11/13/23 0938 11/13/23 1057 11/14/23 0519  WBC 3.3*  --  5.9  HGB 10.7* 11.2* 10.6*  HCT 32.2* 33.0* 31.0*  PLT 121*  --  115*   BMET Recent Labs    11/13/23 0938 11/13/23 1057 11/14/23 0519  NA 129* 130* 128*  K 3.9 4.2 3.7  CL 100  --  98  CO2 19*  --  21*  GLUCOSE 117*  --  106*  BUN 49*  --  57*  CREATININE 1.86*  --  1.73*  CALCIUM 7.0*  --  7.2*   PT/INR Recent Labs    11/12/23 0250  LABPROT 13.5  INR 1.0   ABG Recent Labs    11/13/23 0732 11/13/23 1057  PHART 7.365 7.315*  HCO3 21.7 23.0    Studies/Results: ECHOCARDIOGRAM COMPLETE Result Date: 11/13/2023    ECHOCARDIOGRAM REPORT   Patient Name:   Jenny White Date of Exam: 11/13/2023 Medical Rec #:  [id number]  Height:       67.0 in Accession #:    [id number] Weight:       130.1 lb Date of Birth:  11/24/1945   BSA:          1.684 m Patient Age:    78 years   BP:           144/65 mmHg Patient Gender: F          HR:           90 bpm. Exam Location:  Inpatient Procedure: 2D Echo, Cardiac Doppler, Color Doppler and Intracardiac            Opacification Agent (Both Spectral and Color Flow  Doppler were            utilized during procedure). Indications:   CHF-acute diastolic  History:       Patient has no prior history of Echocardiogram examinations.                Shock.  Sonographer:   Vern Claude Referring      [id number] HYQMVH CHAND Phys:  Sonographer Comments: Image acquisition challenging due to patient body habitus and Image acquisition challenging due to respiratory motion. IMPRESSIONS  1. Poor acoustic windows limit study.  2. Left ventricular ejection fraction, by estimation, is 60 to 65%. The left ventricle has normal function. Left ventricular diastolic parameters are consistent with Grade I diastolic dysfunction (impaired relaxation).  3. Imaging of RV difficult . Right ventricular systolic function is mildly reduced. The right ventricular size is normal.  4. Trivial mitral valve regurgitation.  5. AV is thickened, calcified with mildly restricted motion.. Aortic valve regurgitation is not visualized.  FINDINGS  Left Ventricle: Left ventricular ejection fraction, by estimation, is 60 to 65%. The left ventricle has normal function. The left ventricular internal cavity size was normal in size. There is no left ventricular hypertrophy. Left ventricular diastolic parameters are consistent with Grade I diastolic dysfunction (impaired relaxation). Right Ventricle: Imaging of RV difficult. The right ventricular size is normal. Right vetricular wall thickness was not assessed. Right ventricular systolic function is mildly reduced. Left Atrium: Left atrial size was normal in size. Right Atrium: Right atrial size was normal in size. Pericardium: There is no evidence of pericardial effusion. Mitral Valve: There is mild thickening of the mitral valve leaflet(s). There is mild calcification of the mitral valve leaflet(s). Trivial mitral valve regurgitation. MV peak gradient, 1.5 mmHg. The mean mitral valve gradient is 1.0 mmHg. Tricuspid Valve: The tricuspid valve is normal in structure. Tricuspid valve  regurgitation is not demonstrated. Aortic Valve: AV is thickened, calcified with mildly restricted motion. Aortic valve regurgitation is not visualized. Aortic valve mean gradient measures 1.0 mmHg. Aortic valve peak gradient measures 2.4 mmHg. Aortic valve area, by VTI measures 2.47 cm. Pulmonic Valve: The pulmonic valve was not well visualized. Pulmonic valve regurgitation is not visualized. Aorta: The aortic root is normal in size and structure. IAS/Shunts: No atrial level shunt detected by color flow Doppler.  LEFT VENTRICLE PLAX 2D LVIDd:         3.70 cm     Diastology LVIDs:         2.50 cm     LV e' medial:    6.31 cm/s LV PW:         0.70 cm     LV E/e' medial:  5.6 LV IVS:        0.70 cm     LV e' lateral:   9.03 cm/s LVOT diam:     2.00 cm     LV E/e' lateral: 3.9 LV SV:         29 LV SV Index:   17 LVOT Area:     3.14 cm  LV Volumes (MOD) LV vol d, MOD A4C: 60.4 ml LV vol s, MOD A4C: 18.6 ml LV SV MOD A4C:     60.4 ml RIGHT VENTRICLE RV Basal diam:  2.80 cm RV Mid diam:    2.30 cm RV S prime:     12.70 cm/s TAPSE (M-mode): 3.1 cm LEFT ATRIUM           Index       RIGHT ATRIUM          Index LA diam:      2.50 cm 1.48 cm/m  RA Area:     6.75 cm LA Vol (A2C): 16.5 ml 9.80 ml/m  RA Volume:   11.79 ml 7.00 ml/m LA Vol (A4C): 16.8 ml 9.97 ml/m  AORTIC VALVE                    PULMONIC VALVE AV Area (Vmax):    2.71 cm     PV Vmax:       0.66 m/s AV Area (Vmean):   2.27 cm     PV Peak grad:  1.7 mmHg AV Area (VTI):     2.47 cm AV Vmax:           78.00 cm/s AV Vmean:          53.950 cm/s AV VTI:            0.116 m  AV Peak Grad:      2.4 mmHg AV Mean Grad:      1.0 mmHg LVOT Vmax:         67.20 cm/s LVOT Vmean:        39.050 cm/s LVOT VTI:          0.091 m LVOT/AV VTI ratio: 0.78  AORTA Ao Root diam: 3.20 cm Ao Asc diam:  3.10 cm MITRAL VALVE MV Area (PHT): 2.19 cm    SHUNTS MV Area VTI:   1.91 cm    Systemic VTI:  0.09 m MV Peak grad:  1.5 mmHg    Systemic Diam: 2.00 cm MV Mean grad:  1.0 mmHg MV  Vmax:       0.61 m/s MV Vmean:      42.7 cm/s MV Decel Time: 346 msec MV E velocity: 35.30 cm/s MV A velocity: 58.60 cm/s MV E/A ratio:  0.60 Dietrich Pates MD Electronically signed by Dietrich Pates MD Signature Date/Time: 11/13/2023/2:11:14 PM    Final     Anti-infectives: Anti-infectives (From admission, onward)    Start     Dose/Rate Route Frequency Ordered Stop   11/11/23 2000  piperacillin-tazobactam (ZOSYN) IVPB 3.375 g        3.375 g 12.5 mL/hr over 240 Minutes Intravenous Every 8 hours 11/11/23 1233     11/11/23 1245  piperacillin-tazobactam (ZOSYN) IVPB 3.375 g        3.375 g 100 mL/hr over 30 Minutes Intravenous  Once 11/11/23 1233 11/11/23 1316       Assessment/Plan: POD 3/1 sbr, open abdomen followed by sbr, ileostomy, closure- MW -still on pressors -would continue ng and npo from my standpoing -hold on enteral feeds, can start tpn if ccm wants -from my standpoint needs five days abx postop -cr a little better -sub heparin, scds   Emelia Loron 11/14/2023

## 2023-11-14 NOTE — Plan of Care (Signed)

## 2023-11-14 NOTE — Progress Notes (Signed)
 PHARMACY - TOTAL PARENTERAL NUTRITION CONSULT NOTE   Indication:  Ischemic bowel s/p small bowel resection, end ileostomy  Patient Measurements: Height: 5\' 7"  (170.2 cm) Weight: 59 kg (130 lb 1.1 oz) IBW/kg (Calculated) : 61.6 TPN AdjBW (KG): 54.4 Body mass index is 20.37 kg/m. Usual Weight: 58 kg   Assessment:  78 year old female multiple medical problems who has a prior splenectomy who presented to the emergency room with complaints of abdominal pain with N/V for the past week. She states the abdominal  pain got worsened after dinner. She reported experiencing nausea and  vomiting this morning. In the ED  her labs were significant for a lactate 4.1 After fluid resuscitation, Lactate decreased to 2.5. CT scan showed high grade small bowel obstruction with transition point in the right lower quadrant along the distal ileum. Some trace of ascites and mesenteric stranding. Concern for bowel wall ischemia. Broad spectrum antibiotics ( zosyn) was started, blood cultures pending. Remains on pressor support and enteral nutrition now on hold. Pharmacy consulted to initiate TPN.   Glucose / Insulin: CBGs 110-120, x1 decadron dose 3/05 Electrolytes: K 3.7, Mg 2.1, Phos (3/05) 4.5, Na 128, CO2 21, COCa 9.1, others wnl Renal: AKI on CKD, Scr up 1.73 (bsl ~1), BUN 57 Hepatic: alb 1.6, AST/ALT, Alk phos wnl, Tbili 0.7 Intake / Output; MIVF: NG output 350 mL, 100 mL drain output, ostomy output 650 mL, UOP 0.6 mL/kg/hr  GI Imaging: none since TPN start GI Surgeries / Procedures: none since TPN start  Central access: PICC 11/12/23 TPN start date: 11/14/2023  Nutritional Goals: Goal TPN rate is 60 mL/hr (provides 90 g of protein and 1658 kcals per day)  RD Assessment: Estimated Needs Total Energy Estimated Needs: 1650-1850 kcals Total Protein Estimated Needs: 85-105 g Total Fluid Estimated Needs: >/= 1.7 L  Current Nutrition:  NPO  Plan:  Start TPN at reduced rate of 30 mL/hr at 1800 to meet  50% goal kcal and AA Electrolytes in TPN: Na 73mEq/L, K 79mEq/L, Ca 68mEq/L, Mg 57mEq/L, and Phos 0 mmol/L. Cl:Ac 1:2 Add standard MVI and trace elements to TPN Initiate Sensitive q6h SSI and adjust as needed  Stop MIVF at 1800 Monitor TPN labs on Mon/Thurs, daily as needed  Thank you for allowing pharmacy to be a part of this patient's care.  Thelma Barge, PharmD, BCPS Clinical Pharmacist

## 2023-11-14 NOTE — Progress Notes (Signed)
 NAME:  Jenny White, MRN:  [id number], DOB:  Dec 26, 1945, LOS: 3 ADMISSION DATE:  11/11/2023 CONSULTATION DATE:  11/11/2023 REFERRING MD:  Dwain Sarna - C[facility] CHIEF COMPLAINT: Shock  History of Present Illness:  Jenny White 78 year old woman with a pmhx significant for COPD, GERD, nephrolithiasis , HTN, OSA, Lung cancer ( Adenocarcinoma, diagnosed 05/2023) who presented to the emergency room with complaints of abdominal pain with N/V for the past week. She states the abdominal  pain got worsened after dinner. She reported experiencing nausea and  vomiting this morning.   In the ED  her labs were significant for a lactate 4.1 After fluid resuscitation, Lactate decreased to 2.5. CT scan showed high grade small bowel obstruction with transition point in the right lower quadrant along the distal ileum. Some trace of ascites and mesenteric stranding.Concern for bowel wall ischemia. Broad spectrum antibiotics ( zosyn) was started, blood cultures pending.   Patient was taken to the ER emergently with CCS 3/4 for Ex-lap / LOA. Intraoperative course  was notable for free fluid on entry, with closed loop bowel obstruction noted secondary to tight band adhesion. Patient left in discontinuity/ anastomosis not completed with open abdomen and plan for second look in 24-48 hrs. Patient remained intubated and was transferred to ICU in critical condition.   Pertinent Medical History:   Past Medical History:  Diagnosis Date   Arthritis    in back   COPD (chronic obstructive pulmonary disease) (HCC)    Difficult airway    Due to limited oral opening. Elective glidescope used previously.   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 Elevated LA found to have SBO taken emergently to surgery for EX-LAP.   Interim History / Subjective:  Patient's vasopressor requirement is improving, currently on 6 mics of Levophed and 0.03 of vasopressin Received 500 cc of IV fluid overnight, for acute low urine output Tolerating spontaneous breathing trial Remain afebrile Underwent closure of abdomen with ileostomy creation yesterday  Objective:  Blood pressure 114/75, pulse (!) 105, temperature 99.3 F (37.4 C), resp. rate (!) 22, height 5\' 7"  (1.702 m), weight 59 kg, SpO2 97%.    Vent Mode: PRVC FiO2 (%):  [40 %] 40 % Set Rate:  [18 bmp-28 bmp] 18 bmp Vt Set:  [490 mL] 490 mL PEEP:  [5 cmH20] 5 cmH20 Plateau Pressure:  [19 cmH20-21 cmH20] 19 cmH20   Intake/Output Summary (Last 24 hours) at 11/14/2023 0848 Last data filed at 11/14/2023 0800 Gross per 24 hour  Intake 5132.45 ml  Output 1980 ml  Net 3152.45 ml   Filed Weights   11/11/23 1400 11/12/23 0500  Weight: 54.4 kg 59 kg   Physical Examination: General: Crtitically ill-appearing female, orally intubated HEENT: [place]/AT, eyes anicteric.  ETT and OGT in place Neuro: Eyes open, upward gaze deviation, following simple commands Chest: Coarse breath sounds, no wheezes or rhonchi Heart: Irregularly irregular, tachycardic, no murmurs or gallops Abdomen: Soft, ileostomy present with brown stool Skin: No rash   Labs and images reviewed  Resolved  Hospital Problem List:    Assessment & Plan:  Septic shock due to acute peritonitis Lactic Acidosis Continue to require vasopressor support though requirement has significantly come down, currently on 8 mics of Levophed and vasopressin Received multiple fluid boluses yesterday and overnight Will give 1 bolus of albumin today Continue IV antibiotic with Zosyn Lactate has cleared   High grade SBO with bowel ischemia S/p  EX- lap intraoperative course  was notable for free fluid on entry, with closed loop bowel obstruction noted secondary to tight band adhesion. Patient left in discontinuity/ anastomosis not completed with open abdomen Patient went back to the OR on 3/6, abdomen was closed and ileostomy was created Now she is having brown liquid stool through ileostomy General Surgery is following Will start TPN  Acute Hypoxemic Respiratory Failure  COPD, not in exacerbation OSA Continue lung protective ventilation VAP prevention bundle in place Sedation was stopped Patient is tolerating spontaneous breathing trial, will try to extubate her  Acute kidney injury due to ischemic ATN from shock Acute metabolic acidosis Hyponatremia Monitor intake and output Serum creatinine slightly better than yesterday, down to 1.7 serum bicarbonate remain at 21 Serum sodium trended down to 128 Avoid nephrotoxic agent Monitor intake and output  Paroxysmal Atrial Fibrillation with RVR likely in the setting of critical illness Patient has new diagnosis of paroxysmal A-fib Patient went back to A-fib with RVR, heart rate ranging in 120s to 130s Continue telemetry monitoring Will give her amio bolus   GERD  Continue PPI   Lung cancer   Adenocarcinoma,of RLL diagnosed 05/2023. Followed previously by Dr. Tonia Brooms.  Outpatient pulmonary f/u and age appropriate screenings.    Best Practice: (right click and "Reselect all SmartList Selections" daily)   Diet/type: NPO DVT prophylaxis: SCD GI prophylaxis: PPI Lines: Arterial Line and PICC line Foley:  Yes, and it is still needed Code Status:  full code Last date of multidisciplinary goals of care discussion: 3/6: Patient's daughter was updated at bedside, decision was to continue full scope of care Labs:  CBC: Recent Labs  Lab 11/11/23 1801 11/12/23 0258 11/12/23 1020 11/12/23 1348 11/12/23 1635 11/13/23 0732 11/13/23 0938 11/13/23 1057 11/14/23 0519  WBC  5.3 3.9*  --  1.8*  --   --  3.3*  --  5.9  HGB 12.9 12.9   < > 12.3 12.2 12.9 10.7* 11.2* 10.6*  HCT 40.7 40.3   < > 38.0 36.0 38.0 32.2* 33.0* 31.0*  MCV 94.4 94.8  --  92.7  --   --  90.4  --  88.3  PLT 225 232  --  197  --   --  121*  --  115*   < > = values in this interval not displayed.   Basic Metabolic Panel: Recent Labs  Lab 11/12/23 0250 11/12/23 1020 11/12/23 1348 11/12/23 1635 11/12/23 2018 11/13/23 0439 11/13/23 0732 11/13/23 0938 11/13/23 1057 11/14/23 0519  NA 138   < > 138   < > 135 122* 130* 129* 130* 128*  K 3.9   < > 3.8   < > 3.8 3.9 4.0 3.9 4.2 3.7  CL 106  --  104  --  101 91*  --  100  --  98  CO2 18*  --  23  --  21* 21*  --  19*  --  21*  GLUCOSE 164*  --  155*  --  146* 400*  --  117*  --  106*  BUN 43*  --  40*  --  44* 43*  --  49*  --  57*  CREATININE 1.27*  --  1.23*  --  1.36* 1.67*  --  1.86*  --  1.73*  CALCIUM 8.5*  --  7.8*  --  7.6* 6.9*  --  7.0*  --  7.2*  MG 1.6*  --  2.8*  --  2.5* 2.2  --  2.2  --  2.1  PHOS 4.5  --   --   --   --   --   --   --   --   --    < > = values in this interval not displayed.   GFR: Estimated Creatinine Clearance: 25 mL/min (A) (by C-G formula based on SCr of 1.73 mg/dL (H)). Recent Labs  Lab 11/11/23 1316 11/11/23 1801 11/12/23 0258 11/12/23 1348 11/12/23 1635 11/13/23 0728 11/13/23 0938 11/14/23 0519  WBC  --  5.3 3.9* 1.8*  --   --  3.3* 5.9  LATICACIDVEN 2.5* 1.8  --   --  2.5* 2.6*  --   --    Liver Function Tests: Recent Labs  Lab 11/11/23 1127 11/12/23 0250 11/13/23 0439  AST 46* 40 36  ALT 35 25 25  ALKPHOS 125 60 46  BILITOT 0.8 0.6 0.7  PROT 7.4 4.6* 4.0*  ALBUMIN 3.7 2.4* 1.6*   Recent Labs  Lab 11/11/23 1127  LIPASE 44   No results for input(s): "AMMONIA" in the last 168 hours.  ABG:    Component Value Date/Time   PHART 7.315 (L) 11/13/2023 1057   PCO2ART 45.0 11/13/2023 1057   PO2ART 262 (H) 11/13/2023 1057   HCO3 23.0 11/13/2023 1057   TCO2 24 11/13/2023 1057    ACIDBASEDEF 3.0 (H) 11/13/2023 1057   O2SAT 100 11/13/2023 1057   Coagulation Profile: Recent Labs  Lab 11/12/23 0250  INR 1.0    Cardiac Enzymes: No results for input(s): "CKTOTAL", "CKMB", "CKMBINDEX", "TROPONINI" in the last 168 hours.  HbA1C: No results found for: "HGBA1C"  CBG: Recent Labs  Lab 11/11/23 2349 11/12/23 0804 11/12/23 1129 11/12/23 1542 11/13/23 0727  GLUCAP 134* 106* 112* 128* 119*      Critical care time:    The patient is critically ill due to Septic shock due to acute peritonitis/ High grade SBO.  Critical care was necessary to treat or prevent imminent or life-threatening deterioration.  Critical care was time spent personally by me on the following activities: development of treatment plan with patient and/or surrogate as well as nursing, discussions with consultants, evaluation of patient's response to treatment, examination of patient, obtaining history from patient or surrogate, ordering and performing treatments and interventions, ordering and review of laboratory studies, ordering and review of radiographic studies, pulse oximetry, re-evaluation of patient's condition and participation in multidisciplinary rounds.   During this encounter critical care time was devoted to patient care services described in this note for 38 minutes.     Cheri Fowler, MD [facility] See Amion for pager If no response to pager, please call [phone] until 7pm After 7pm, Please call E-link [phone]

## 2023-11-14 NOTE — Procedures (Signed)
 Extubation Procedure Note  Patient Details:   Name: Jenny White DOB: 1945/11/03 MRN: [id number]   Airway Documentation:    Vent end date: 11/14/23 Vent end time: 1143   Evaluation  O2 sats: stable throughout Complications: No apparent complications Patient did tolerate procedure well. Bilateral Breath Sounds: Diminished   Yes  RT extubated patient to 4L [place] per MD order with RN at bedside. Positive cuff leak noted. Patient tolerated well, no distress or stridor noted at this time. RT will continue to monitor as needed.   Jaquelyn Bitter 11/14/2023, 11:44 AM

## 2023-11-14 NOTE — Progress Notes (Signed)
 Nutrition Follow-up  DOCUMENTATION CODES:   Not applicable  INTERVENTION:   Discussed nutrition poc with Dr. Merrily Pew and plan to start TPN today. TPN to meet nutritional needs  Recommend continuing to hold EN while requiring pressors given GI surgery. If TF initiated while on pressors recommend starting at trickle rate initially with no orders for advancement.    NUTRITION DIAGNOSIS:   Inadequate oral intake related to acute illness, altered GI function as evidenced by NPO status.  Being addressed via TPN  GOAL:   Patient will meet greater than or equal to 90% of their needs  Progressing  MONITOR:   Diet advancement, Skin, I & O's, Labs, Weight trends (TPN)  REASON FOR ASSESSMENT:   Ventilator    ASSESSMENT:   78 yo female admitted with mixed shock, septic and hypovolemic, high grade SBO with bowel ischemia. PMH includes COPD, GERD, nephrolithiasis, HTN, OSA, lung cancer (adenocarcinoma RLL dx 05/2023)  3/04 Ex Lap, SB resection, Abthera wound VAC placement, open abdomen with bowel in discontinuity  3/06 OR for closure of abdomen, SB resection end ileostomy 3/07 TPN initiated, extubated  Extubated today. Vasopressor requirements continue to improve, levophed at 6, vasopressin 0.03  NG in place, remains NPO +output via ileostomy  No weight since admission; noted daily weights initiated with TPN order set  Labs: sodium 128 (L), potassium 3.7 (wdl), phosphorus 4.5 (wdl) Meds: reviewed   Diet Order:   Diet Order             Diet NPO time specified  Diet effective now                   EDUCATION NEEDS:   Not appropriate for education at this time  Skin:  Skin Assessment: Skin Integrity Issues: Skin Integrity Issues:: Incisions Wound Vac: removed Incisions: closed abd incision with new ileostomy creation  Last BM:  +output via end ileostomy  Height:   Ht Readings from Last 1 Encounters:  11/11/23 5\' 7"  (1.702 m)    Weight:   Wt Readings from  Last 1 Encounters:  11/12/23 59 kg    BMI:  Body mass index is 20.37 kg/m.  Estimated Nutritional Needs:   Kcal:  1650-1850 kcals  Protein:  85-105 g  Fluid:  >/= 1.7 L   Romelle Starcher MS, RDN, LDN, CNSC Registered Dietitian 3 Clinical Nutrition RD Inpatient Contact Info in Amion

## 2023-11-15 ENCOUNTER — Inpatient Hospital Stay (HOSPITAL_COMMUNITY)

## 2023-11-15 DIAGNOSIS — J449 Chronic obstructive pulmonary disease, unspecified: Secondary | ICD-10-CM | POA: Diagnosis not present

## 2023-11-15 DIAGNOSIS — R6521 Severe sepsis with septic shock: Secondary | ICD-10-CM | POA: Diagnosis not present

## 2023-11-15 DIAGNOSIS — K56600 Partial intestinal obstruction, unspecified as to cause: Secondary | ICD-10-CM | POA: Diagnosis not present

## 2023-11-15 DIAGNOSIS — J810 Acute pulmonary edema: Secondary | ICD-10-CM

## 2023-11-15 DIAGNOSIS — A419 Sepsis, unspecified organism: Secondary | ICD-10-CM | POA: Diagnosis not present

## 2023-11-15 LAB — COMPREHENSIVE METABOLIC PANEL
ALT: 22 U/L (ref 0–44)
AST: 30 U/L (ref 15–41)
Albumin: 1.6 g/dL — ABNORMAL LOW (ref 3.5–5.0)
Alkaline Phosphatase: 63 U/L (ref 38–126)
Anion gap: 12 (ref 5–15)
BUN: 59 mg/dL — ABNORMAL HIGH (ref 8–23)
CO2: 22 mmol/L (ref 22–32)
Calcium: 8.3 mg/dL — ABNORMAL LOW (ref 8.9–10.3)
Chloride: 97 mmol/L — ABNORMAL LOW (ref 98–111)
Creatinine, Ser: 1.75 mg/dL — ABNORMAL HIGH (ref 0.44–1.00)
GFR, Estimated: 29 mL/min — ABNORMAL LOW (ref >60)
Glucose, Bld: 158 mg/dL — ABNORMAL HIGH (ref 70–99)
Potassium: 3.2 mmol/L — ABNORMAL LOW (ref 3.5–5.1)
Sodium: 131 mmol/L — ABNORMAL LOW (ref 135–145)
Total Bilirubin: 0.5 mg/dL (ref 0.0–1.2)
Total Protein: 4.3 g/dL — ABNORMAL LOW (ref 6.5–8.1)

## 2023-11-15 LAB — BASIC METABOLIC PANEL
Anion gap: 13 (ref 5–15)
BUN: 56 mg/dL — ABNORMAL HIGH (ref 8–23)
CO2: 23 mmol/L (ref 22–32)
Calcium: 8.4 mg/dL — ABNORMAL LOW (ref 8.9–10.3)
Chloride: 97 mmol/L — ABNORMAL LOW (ref 98–111)
Creatinine, Ser: 1.64 mg/dL — ABNORMAL HIGH (ref 0.44–1.00)
GFR, Estimated: 32 mL/min — ABNORMAL LOW (ref >60)
Glucose, Bld: 107 mg/dL — ABNORMAL HIGH (ref 70–99)
Potassium: 3.9 mmol/L (ref 3.5–5.1)
Sodium: 133 mmol/L — ABNORMAL LOW (ref 135–145)

## 2023-11-15 LAB — MAGNESIUM: Magnesium: 2.3 mg/dL (ref 1.7–2.4)

## 2023-11-15 LAB — CBC
HCT: 27 % — ABNORMAL LOW (ref 36.0–46.0)
Hemoglobin: 9.1 g/dL — ABNORMAL LOW (ref 12.0–15.0)
MCH: 30 pg (ref 26.0–34.0)
MCHC: 33.7 g/dL (ref 30.0–36.0)
MCV: 89.1 fL (ref 80.0–100.0)
Platelets: 79 10*3/uL — ABNORMAL LOW (ref 150–400)
RBC: 3.03 MIL/uL — ABNORMAL LOW (ref 3.87–5.11)
RDW: 17.2 % — ABNORMAL HIGH (ref 11.5–15.5)
WBC: 10.8 10*3/uL — ABNORMAL HIGH (ref 4.0–10.5)
nRBC: 7.5 % — ABNORMAL HIGH (ref 0.0–0.2)

## 2023-11-15 LAB — PHOSPHORUS: Phosphorus: 4 mg/dL (ref 2.5–4.6)

## 2023-11-15 LAB — TRIGLYCERIDES: Triglycerides: 134 mg/dL (ref <150)

## 2023-11-15 LAB — GLUCOSE, CAPILLARY
Glucose-Capillary: 134 mg/dL — ABNORMAL HIGH (ref 70–99)
Glucose-Capillary: 146 mg/dL — ABNORMAL HIGH (ref 70–99)
Glucose-Capillary: 93 mg/dL (ref 70–99)
Glucose-Capillary: 103 mg/dL — ABNORMAL HIGH (ref 70–99)
Glucose-Capillary: 109 mg/dL — ABNORMAL HIGH (ref 70–99)

## 2023-11-15 MED ORDER — PIPERACILLIN-TAZOBACTAM 3.375 G IVPB 30 MIN
3.3750 g | Freq: Three times a day (TID) | INTRAVENOUS | Status: DC
  Administered 2023-11-15 – 2023-11-17 (×6): 3.375 g via INTRAVENOUS
  Filled 2023-11-15 (×6): qty 50

## 2023-11-15 MED ORDER — FUROSEMIDE 10 MG/ML IJ SOLN: 60.0000 mg | Freq: Three times a day (TID) | INTRAMUSCULAR | Status: DC

## 2023-11-15 MED ORDER — FUROSEMIDE 10 MG/ML IJ SOLN
60.0000 mg | Freq: Once | INTRAMUSCULAR | Status: AC
  Administered 2023-11-15: 60 mg via INTRAVENOUS
  Filled 2023-11-15: qty 6

## 2023-11-15 MED ORDER — DEXMEDETOMIDINE HCL IN NACL 400 MCG/100ML IV SOLN
0.0000 ug/kg/h | INTRAVENOUS | Status: DC
  Administered 2023-11-15: 0.4 ug/kg/h via INTRAVENOUS
  Administered 2023-11-16: 0.8 ug/kg/h via INTRAVENOUS
  Administered 2023-11-16: 1.2 ug/kg/h via INTRAVENOUS
  Administered 2023-11-16: 0.6 ug/kg/h via INTRAVENOUS
  Administered 2023-11-17: 0.7 ug/kg/h via INTRAVENOUS
  Administered 2023-11-17 (×2): 1 ug/kg/h via INTRAVENOUS
  Administered 2023-11-17: 1.1 ug/kg/h via INTRAVENOUS
  Administered 2023-11-18: 1 ug/kg/h via INTRAVENOUS
  Filled 2023-11-15 (×8): qty 100

## 2023-11-15 MED ORDER — FENTANYL BOLUS VIA INFUSION: 50.0000 ug | INTRAVENOUS | Status: DC | PRN

## 2023-11-15 MED ORDER — REVEFENACIN 175 MCG/3ML IN SOLN
175.0000 ug | Freq: Every day | RESPIRATORY_TRACT | Status: DC
  Administered 2023-11-15 – 2023-12-11 (×27): 175 ug via RESPIRATORY_TRACT
  Filled 2023-11-15 (×27): qty 3

## 2023-11-15 MED ORDER — POTASSIUM CHLORIDE 10 MEQ/100ML IV SOLN
10.0000 meq | INTRAVENOUS | Status: AC
Start: 2023-11-15 — End: 2023-11-15
  Administered 2023-11-15 (×4): 10 meq via INTRAVENOUS
  Filled 2023-11-15 (×4): qty 100

## 2023-11-15 MED ORDER — ALBUMIN HUMAN 25 % IV SOLN
25.0000 g | Freq: Four times a day (QID) | INTRAVENOUS | Status: AC
  Administered 2023-11-15 – 2023-11-16 (×4): 25 g via INTRAVENOUS
  Filled 2023-11-15 (×4): qty 100

## 2023-11-15 MED ORDER — FENTANYL CITRATE PF 50 MCG/ML IJ SOSY
50.0000 ug | PREFILLED_SYRINGE | Freq: Once | INTRAMUSCULAR | Status: AC
  Filled 2023-11-15: qty 1

## 2023-11-15 MED ORDER — ALBUMIN HUMAN 25 % IV SOLN
25.0000 g | Freq: Once | INTRAVENOUS | Status: AC
  Administered 2023-11-15: 12.5 g via INTRAVENOUS
  Filled 2023-11-15: qty 100

## 2023-11-15 MED ORDER — BUDESONIDE 0.5 MG/2ML IN SUSP
0.5000 mg | Freq: Two times a day (BID) | RESPIRATORY_TRACT | Status: DC
  Administered 2023-11-15 – 2023-11-20 (×11): 0.5 mg via RESPIRATORY_TRACT
  Filled 2023-11-15 (×11): qty 2

## 2023-11-15 MED ORDER — POTASSIUM CHLORIDE 10 MEQ/50ML IV SOLN
10.0000 meq | INTRAVENOUS | Status: AC
  Administered 2023-11-15 (×4): 10 meq via INTRAVENOUS
  Filled 2023-11-15: qty 50

## 2023-11-15 MED ORDER — FENTANYL 2500MCG IN NS 250ML (10MCG/ML) PREMIX INFUSION
50.0000 ug/h | INTRAVENOUS | Status: DC
  Administered 2023-11-15: 50 ug/h via INTRAVENOUS
  Filled 2023-11-15: qty 250

## 2023-11-15 MED ORDER — MAGNESIUM SULFATE 2 GM/50ML IV SOLN
2.0000 g | Freq: Once | INTRAVENOUS | Status: AC
  Administered 2023-11-15: 2 g via INTRAVENOUS
  Filled 2023-11-15: qty 50

## 2023-11-15 MED ORDER — FUROSEMIDE 10 MG/ML IJ SOLN
60.0000 mg | Freq: Three times a day (TID) | INTRAMUSCULAR | Status: DC
  Administered 2023-11-15 – 2023-11-18 (×9): 60 mg via INTRAVENOUS
  Filled 2023-11-15 (×9): qty 6

## 2023-11-15 MED ORDER — POTASSIUM CHLORIDE 10 MEQ/100ML IV SOLN: 10.0000 meq | INTRAVENOUS | Status: DC

## 2023-11-15 MED ORDER — POTASSIUM CHLORIDE 10 MEQ/50ML IV SOLN
10.0000 meq | INTRAVENOUS | Status: AC
  Administered 2023-11-15 (×4): 10 meq via INTRAVENOUS
  Filled 2023-11-15 (×4): qty 50

## 2023-11-15 MED ORDER — TRACE MINERALS CU-MN-SE-ZN 300-55-60-3000 MCG/ML IV SOLN
INTRAVENOUS | Status: AC
Start: 2023-11-15 — End: 2023-11-16
  Filled 2023-11-15: qty 571.2

## 2023-11-15 MED ORDER — ACETAMINOPHEN 10 MG/ML IV SOLN
1000.0000 mg | Freq: Four times a day (QID) | INTRAVENOUS | Status: AC
  Administered 2023-11-15 – 2023-11-16 (×4): 1000 mg via INTRAVENOUS
  Filled 2023-11-15 (×4): qty 100

## 2023-11-15 MED ORDER — ARFORMOTEROL TARTRATE 15 MCG/2ML IN NEBU
15.0000 ug | INHALATION_SOLUTION | Freq: Two times a day (BID) | RESPIRATORY_TRACT | Status: DC
  Administered 2023-11-15 – 2023-12-11 (×49): 15 ug via RESPIRATORY_TRACT
  Filled 2023-11-15 (×48): qty 2

## 2023-11-15 NOTE — Progress Notes (Signed)
 There was a consult for placing a PIV access. TL PICC on Rt. Arm. Assessed Lt. Arm by [place]. There was no a suitable vein. Both arms were swollen +3-4 edema. Veins were very small and deep or non-compressible. Informed patient's RN regarding this finding. No PIV access at this time. HS [employer]

## 2023-11-15 NOTE — Progress Notes (Signed)
 Bite block placed by RT X2  at bedside. Pt tolerated well, ETT placement remained secure at 22cm measured at lips, vitals stable, RN at bedside, RT will monitor as needed.     11/15/23 1456  Airway 7.5 mm  Placement Date/Time: 11/14/23 1400   Placed By: ICU physician  Airway Device: Endotracheal Tube  Laryngoscope Blade: 3;MAC  ETT Types: Oral  Size (mm): 7.5 mm  Cuffed: Cuffed  Insertion attempts: 1  Airway Equipment: Stylet  Placement Confirmation: Judi Cong...  Secured at (cm) 22 cm  Measured From [employer]  Secured By English as a second language teacher (S)  Yes (Placed with 2 RTs at bedside.)  Tube Holder Repositioned Yes  Prone position No  Cuff Pressure (cm H2O) Clear OR 27-39 CmH2O  Site Condition Dry

## 2023-11-15 NOTE — Progress Notes (Signed)
 PT Cancellation Note  Patient Details Name: LAURIAN EDRINGTON MRN: [id number] DOB: [birth year]   Cancelled Treatment:    Reason Eval/Treat Not Completed: Patient not medically ready (pt remains intubated and not medically ready. Will sign off and await new order)   Kaleigh Spiegelman B Naje Rice 11/15/2023, 9:49 AM Merryl Hacker, PT Acute Rehabilitation Services Office: [phone]

## 2023-11-15 NOTE — Progress Notes (Signed)
 eLink Physician-Brief Progress Note Patient Name: Jenny White DOB: 1946-05-11 MRN: [id number]   Date of Service  11/15/2023  HPI/Events of Note  Potassium 3.2, Cr 1.75  eICU Interventions  Potassium chloride IV ordered.  Will continue to follow serial K.         Ailen Strauch M DELA CRUZ 11/15/2023, 2:46 AM

## 2023-11-15 NOTE — Progress Notes (Signed)
 PHARMACY - TOTAL PARENTERAL NUTRITION CONSULT NOTE   Indication:  Ischemic bowel s/p small bowel resection, end ileostomy  Patient Measurements: Height: 5\' 7"  (170.2 cm) Weight: 63.8 kg (140 lb 10.5 oz) IBW/kg (Calculated) : 61.6 TPN AdjBW (KG): 54.4 Body mass index is 22.03 kg/m. Usual Weight: 58 kg   Assessment:  78 year old female multiple medical problems who has a prior splenectomy who presented to the emergency room with complaints of abdominal pain with N/V for the past week. She states the abdominal pain got worsened after dinner. She reported experiencing nausea and  vomiting this morning. CT scan showed high grade small bowel obstruction with transition point in the right lower quadrant along the distal ileum. Some trace of ascites and mesenteric stranding. Concern for bowel wall ischemia. Broad spectrum antibiotics (zosyn) was started, blood cultures pending. Remains on pressor support and enteral nutrition now on hold. Pharmacy consulted to initiate TPN.   Glucose / Insulin: x1 decadron dose 3/05; A1c 5.8%, CBGs 90-160s since TPN start, 2 units insulin/24 hr Electrolytes: K 3.2, Mg 2.3, Phos 4, Na 131, CO2 22, COCa 10.2, others wnl Renal: AKI on CKD, Scr up 1.75 (bsl ~1), BUN 59 Hepatic: alb 1.6, AST/ALT, Alk phos wnl, Tbili 0.5 Intake / Output; MIVF: ostomy output 350 mL, UOP 0.7 mL/kg/hr  GI Imaging:  3/04 high-grade small-bowel obstruction is with transition point in the right lower quadrant along the distal ileum. There is some trace ascites and some mesenteric stranding GI Surgeries / Procedures:  3/04 ex lap, small bowel resection, open abdomen with bowel in discontinuity  3/06 small bowel resection, end ileostomy, closure of abdomen  Central access: PICC 11/12/23 TPN start date: 11/14/2023  Nutritional Goals: Goal TPN rate is 60 mL/hr (provides 90 g of protein and 1658 kcals per day)  RD Assessment: Estimated Needs Total Energy Estimated Needs: 1650-1850  kcals Total Protein Estimated Needs: 85-105 g Total Fluid Estimated Needs: >/= 1.7 L  Current Nutrition:  NPO  Plan:  Increase TPN to 60 mL/hr at 1800 to meet 50% goal kcal and AA Electrolytes in TPN: Na 60mEq/L, K 25 mEq/L, Ca 56mEq/L, Mg 51mEq/L, and Phos 0 mmol/L. Cl:Ac 1:2 IV Kcl 40 mEq outside of TPN (3/08) Add standard MVI and trace elements to TPN Continue Sensitive q6h SSI and adjust as needed  Monitor TPN labs on Mon/Thurs, daily as needed  Thank you for allowing pharmacy to be a part of this patient's care.  Thelma Barge, PharmD, BCPS Clinical Pharmacist

## 2023-11-15 NOTE — Procedures (Signed)
 Arterial Line Insertion Start/End3/04/2024 12:38 AM  Patient location: ICU. Preanesthetic checklist: patient identified, site marked, risks and benefits discussed, monitors and equipment checked, pre-op evaluation and timeout performed Emergency situation Right, radial was placed Catheter size: 20 G Hand hygiene performed , maximum sterile barriers used  and Seldinger technique used Allen's test indicative of satisfactory collateral circulation Attempts: 3 Procedure performed without using ultrasound guided technique. Following insertion, dressing applied and Biopatch. Post procedure assessment: normal  Patient tolerated the procedure well with no immediate complications.

## 2023-11-15 NOTE — Progress Notes (Signed)
 2 Days Post-Op   Subjective/Chief Complaint: Reintubated yesterday, today does mae, pressors decreased   Objective: Vital signs in last 24 hours: Temp:  [97.4 F (36.3 C)-99.1 F (37.3 C)] 97.4 F (36.3 C) (03/08 0345) Pulse Rate:  [64-133] 92 (03/08 0800) Resp:  [0-30] 24 (03/08 0800) BP: (98-160)/(51-83) 155/81 (03/08 0800) SpO2:  [84 %-99 %] 96 % (03/08 0800) Arterial Line BP: (79-204)/(40-120) 197/71 (03/08 0800) FiO2 (%):  [40 %-60 %] 40 % (03/08 0343) Weight:  [63.8 kg] 63.8 kg (03/08 0500) Last BM Date :  (pta)  Intake/Output from previous day: 03/07 0701 - 03/08 0700 In: 2815.2 [I.V.:1740.3; IV Piggyback:974.9] Out: 1410 [Urine:1060; Stool:350] Intake/Output this shift: Total I/O In: 66.9 [I.V.:65.9; IV Piggyback:0.9] Out: 375 [Urine:75; Stool:300]  Ab approp tender ostomy pink with output present dressing dry  Lab Results:  Recent Labs    11/13/23 0938 11/13/23 1057 11/14/23 0519 11/14/23 1546  WBC 3.3*  --  5.9  --   HGB 10.7*   < > 10.6* 10.5*  HCT 32.2*   < > 31.0* 31.0*  PLT 121*  --  115*  --    < > = values in this interval not displayed.   BMET Recent Labs    11/14/23 0519 11/14/23 1546 11/15/23 0151  NA 128* 131* 131*  K 3.7 3.6 3.2*  CL 98  --  97*  CO2 21*  --  22  GLUCOSE 106*  --  158*  BUN 57*  --  59*  CREATININE 1.73*  --  1.75*  CALCIUM 7.2*  --  8.3*   PT/INR No results for input(s): "LABPROT", "INR" in the last 72 hours. ABG Recent Labs    11/13/23 1057 11/14/23 1546  PHART 7.315* 7.334*  HCO3 23.0 23.9    Studies/Results: DG Chest Port 1 View Result Date: 11/14/2023 CLINICAL DATA:  Intubation EXAM: PORTABLE CHEST 1 VIEW COMPARISON:  X-ray 11/12/2023 and older FINDINGS: The extreme lung apices are clipped off the edge of the film. ET tube in place. Enteric tube with tip extending beneath the diaphragm. Right-sided PICC with tip along the central SVC above the right atrium. Hyperinflation. No pneumothorax or edema.  Chronic lung changes suggested. Tiny bilateral pleural effusions are seen with some focal left retrocardiac opacity. These are new from previous. Overlapping cardiac leads. Area of nodularity left upper lung with a marker is again noted. Nodular area at the right lung base as well. IMPRESSION: New right-sided PICC. Developing small bilateral pleural effusions and left retrocardiac opacity. Acute process is possible. Recommend follow-up. Electronically Signed   By: Karen Kays M.D.   On: 11/14/2023 17:59   ECHOCARDIOGRAM COMPLETE Result Date: 11/13/2023    ECHOCARDIOGRAM REPORT   Patient Name:   QUINNE PIRES Date of Exam: 11/13/2023 Medical Rec #:  [id number]  Height:       67.0 in Accession #:    [id number] Weight:       130.1 lb Date of Birth:  18-Feb-1946   BSA:          1.684 m Patient Age:    78 years   BP:           144/65 mmHg Patient Gender: F          HR:           90 bpm. Exam Location:  Inpatient Procedure: 2D Echo, Cardiac Doppler, Color Doppler and Intracardiac            Opacification Agent (Both  Spectral and Color Flow Doppler were            utilized during procedure). Indications:   CHF-acute diastolic  History:       Patient has no prior history of Echocardiogram examinations.                Shock.  Sonographer:   Vern Claude Referring      [id number] MVHQIO CHAND Phys:  Sonographer Comments: Image acquisition challenging due to patient body habitus and Image acquisition challenging due to respiratory motion. IMPRESSIONS  1. Poor acoustic windows limit study.  2. Left ventricular ejection fraction, by estimation, is 60 to 65%. The left ventricle has normal function. Left ventricular diastolic parameters are consistent with Grade I diastolic dysfunction (impaired relaxation).  3. Imaging of RV difficult . Right ventricular systolic function is mildly reduced. The right ventricular size is normal.  4. Trivial mitral valve regurgitation.  5. AV is thickened, calcified with mildly restricted motion.. Aortic  valve regurgitation is not visualized. FINDINGS  Left Ventricle: Left ventricular ejection fraction, by estimation, is 60 to 65%. The left ventricle has normal function. The left ventricular internal cavity size was normal in size. There is no left ventricular hypertrophy. Left ventricular diastolic parameters are consistent with Grade I diastolic dysfunction (impaired relaxation). Right Ventricle: Imaging of RV difficult. The right ventricular size is normal. Right vetricular wall thickness was not assessed. Right ventricular systolic function is mildly reduced. Left Atrium: Left atrial size was normal in size. Right Atrium: Right atrial size was normal in size. Pericardium: There is no evidence of pericardial effusion. Mitral Valve: There is mild thickening of the mitral valve leaflet(s). There is mild calcification of the mitral valve leaflet(s). Trivial mitral valve regurgitation. MV peak gradient, 1.5 mmHg. The mean mitral valve gradient is 1.0 mmHg. Tricuspid Valve: The tricuspid valve is normal in structure. Tricuspid valve regurgitation is not demonstrated. Aortic Valve: AV is thickened, calcified with mildly restricted motion. Aortic valve regurgitation is not visualized. Aortic valve mean gradient measures 1.0 mmHg. Aortic valve peak gradient measures 2.4 mmHg. Aortic valve area, by VTI measures 2.47 cm. Pulmonic Valve: The pulmonic valve was not well visualized. Pulmonic valve regurgitation is not visualized. Aorta: The aortic root is normal in size and structure. IAS/Shunts: No atrial level shunt detected by color flow Doppler.  LEFT VENTRICLE PLAX 2D LVIDd:         3.70 cm     Diastology LVIDs:         2.50 cm     LV e' medial:    6.31 cm/s LV PW:         0.70 cm     LV E/e' medial:  5.6 LV IVS:        0.70 cm     LV e' lateral:   9.03 cm/s LVOT diam:     2.00 cm     LV E/e' lateral: 3.9 LV SV:         29 LV SV Index:   17 LVOT Area:     3.14 cm  LV Volumes (MOD) LV vol d, MOD A4C: 60.4 ml LV vol s,  MOD A4C: 18.6 ml LV SV MOD A4C:     60.4 ml RIGHT VENTRICLE RV Basal diam:  2.80 cm RV Mid diam:    2.30 cm RV S prime:     12.70 cm/s TAPSE (M-mode): 3.1 cm LEFT ATRIUM           Index  RIGHT ATRIUM          Index LA diam:      2.50 cm 1.48 cm/m  RA Area:     6.75 cm LA Vol (A2C): 16.5 ml 9.80 ml/m  RA Volume:   11.79 ml 7.00 ml/m LA Vol (A4C): 16.8 ml 9.97 ml/m  AORTIC VALVE                    PULMONIC VALVE AV Area (Vmax):    2.71 cm     PV Vmax:       0.66 m/s AV Area (Vmean):   2.27 cm     PV Peak grad:  1.7 mmHg AV Area (VTI):     2.47 cm AV Vmax:           78.00 cm/s AV Vmean:          53.950 cm/s AV VTI:            0.116 m AV Peak Grad:      2.4 mmHg AV Mean Grad:      1.0 mmHg LVOT Vmax:         67.20 cm/s LVOT Vmean:        39.050 cm/s LVOT VTI:          0.091 m LVOT/AV VTI ratio: 0.78  AORTA Ao Root diam: 3.20 cm Ao Asc diam:  3.10 cm MITRAL VALVE MV Area (PHT): 2.19 cm    SHUNTS MV Area VTI:   1.91 cm    Systemic VTI:  0.09 m MV Peak grad:  1.5 mmHg    Systemic Diam: 2.00 cm MV Mean grad:  1.0 mmHg MV Vmax:       0.61 m/s MV Vmean:      42.7 cm/s MV Decel Time: 346 msec MV E velocity: 35.30 cm/s MV A velocity: 58.60 cm/s MV E/A ratio:  0.60 Dietrich Pates MD Electronically signed by Dietrich Pates MD Signature Date/Time: 11/13/2023/2:11:14 PM    Final     Anti-infectives: Anti-infectives (From admission, onward)    Start     Dose/Rate Route Frequency Ordered Stop   11/11/23 2000  piperacillin-tazobactam (ZOSYN) IVPB 3.375 g        3.375 g 12.5 mL/hr over 240 Minutes Intravenous Every 8 hours 11/11/23 1233     11/11/23 1245  piperacillin-tazobactam (ZOSYN) IVPB 3.375 g        3.375 g 100 mL/hr over 30 Minutes Intravenous  Once 11/11/23 1233 11/11/23 1316       Assessment/Plan: POD 4/2 sbr, open abdomen followed by sbr, ileostomy, closure- MW -still on pressors but much better today -would continue ng and npo from my standpoint -hold on enteral feeds,tpn a good idea -zoysn d2/5   -cr about the same today, 1400 uop yesterday -sub heparin, scds -appreciate ccm care -wicks in wound out early next week  Emelia Loron 11/15/2023

## 2023-11-15 NOTE — Progress Notes (Signed)
 Per PCCM, okay to place a-line in right upper extremity radial.

## 2023-11-15 NOTE — Progress Notes (Signed)
 NAME:  Jenny White, MRN:  [id number], DOB:  20-Jul-1946, LOS: 4 ADMISSION DATE:  11/11/2023 CONSULTATION DATE:  11/11/2023 REFERRING MD:  Dwain Sarna - CCS CHIEF COMPLAINT: Shock  History of Present Illness:  Jenny White 78 year old woman with a pmhx significant for COPD, GERD, nephrolithiasis , HTN, OSA, Lung cancer ( Adenocarcinoma, diagnosed 05/2023) who presented to the emergency room with complaints of abdominal pain with N/V for the past week. She states the abdominal  pain got worsened after dinner. She reported experiencing nausea and  vomiting this morning.   In the ED  her labs were significant for a lactate 4.1 After fluid resuscitation, Lactate decreased to 2.5. CT scan showed high grade small bowel obstruction with transition point in the right lower quadrant along the distal ileum. Some trace of ascites and mesenteric stranding.Concern for bowel wall ischemia. Broad spectrum antibiotics ( zosyn) was started, blood cultures pending.   Patient was taken to the ER emergently with CCS 3/4 for Ex-lap / LOA. Intraoperative course  was notable for free fluid on entry, with closed loop bowel obstruction noted secondary to tight band adhesion. Patient left in discontinuity/ anastomosis not completed with open abdomen and plan for second look in 24-48 hrs. Patient remained intubated and was transferred to ICU in critical condition.   Pertinent Medical History:   Past Medical History:  Diagnosis Date   Arthritis    in back   COPD (chronic obstructive pulmonary disease) (HCC)    Difficult airway    Due to limited oral opening. Elective glidescope used previously.   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 Elevated LA found to have SBO taken emergently to surgery for EX-LAP.   Interim History / Subjective:  Vasopressors off Awake follow commands Appears air hungry  Objective:  Blood pressure (!) 155/81, pulse 92, temperature (!) 97.4 F (36.3 C), temperature source Oral, resp. rate (!) 24, height 5\' 7"  (1.702 m), weight 63.8 kg, SpO2 96%.    Vent Mode: PRVC FiO2 (%):  [40 %-60 %] 40 % Set Rate:  [20 bmp] 20 bmp Vt Set:  [490 mL] 490 mL PEEP:  [5 cmH20] 5 cmH20 Pressure Support:  [8 cmH20] 8 cmH20 Plateau Pressure:  [17 cmH20-19 cmH20] 17 cmH20   Intake/Output Summary (Last 24 hours) at 11/15/2023 0911 Last data filed at 11/15/2023 0800 Gross per 24 hour  Intake 2768.84 ml  Output 1585 ml  Net 1183.84 ml   Filed Weights   11/11/23 1400 11/12/23 0500 11/15/23 0500  Weight: 54.4 kg 59 kg 63.8 kg   Physical Examination: Awake on vent +accessory muscle use Minimal secretions Global anasarca ++ Abd soft ostomy pink minimal output Minimal OGT output  Stable renal function K low    Assessment & Plan:  High grade SBO w/ bowel ischemia w/ peritonitis- ex lap, SB resection, abthera 3/4, further resection, end ileostomy, and abd wall closure 3/6 Postop vasoplegia/ septic shock- improved Advanced COPD RLL Lung cancer- s/p SBRT with good response Need for sedation with mechanical ventilation  Baseline chronic pain/anxiety Acute kidney injury in  context of peritonitis- stable Pulmonary edema  Failed SBT due to WOB, hypoxemia, CXR looks wet - Zosyn x 5 days from closure - NGT activity and ostomy care per CCS - TPN - Low dose continuous precedex/fent seem to cause less HD perturbations than pushes - Start trying to push diuresis today; check afternoon K - Triple neb therapy - Guarded prognosis, family very realistic; will take things a day at a time  Best Practice:  (right click and "Reselect all SmartList Selections" daily)   Diet/type: TPN DVT prophylaxis: heparin dvt ppx GI prophylaxis: PPI Lines: Arterial Line and PICC line Foley:  Yes, and it is still needed Code Status:  full code Last date of multidisciplinary goals of care discussion: 3/7  41 min cc time Myrla Halsted MD PCCM

## 2023-11-16 DIAGNOSIS — R6521 Severe sepsis with septic shock: Secondary | ICD-10-CM | POA: Diagnosis not present

## 2023-11-16 DIAGNOSIS — A419 Sepsis, unspecified organism: Secondary | ICD-10-CM | POA: Diagnosis not present

## 2023-11-16 DIAGNOSIS — J449 Chronic obstructive pulmonary disease, unspecified: Secondary | ICD-10-CM | POA: Diagnosis not present

## 2023-11-16 DIAGNOSIS — K56600 Partial intestinal obstruction, unspecified as to cause: Secondary | ICD-10-CM | POA: Diagnosis not present

## 2023-11-16 LAB — BASIC METABOLIC PANEL
Anion gap: 10 (ref 5–15)
Anion gap: 9 (ref 5–15)
Anion gap: 14 (ref 5–15)
BUN: 53 mg/dL — ABNORMAL HIGH (ref 8–23)
BUN: 53 mg/dL — ABNORMAL HIGH (ref 8–23)
BUN: 53 mg/dL — ABNORMAL HIGH (ref 8–23)
CO2: 26 mmol/L (ref 22–32)
CO2: 30 mmol/L (ref 22–32)
CO2: 25 mmol/L (ref 22–32)
Calcium: 8.9 mg/dL (ref 8.9–10.3)
Calcium: 8.6 mg/dL — ABNORMAL LOW (ref 8.9–10.3)
Calcium: 8.7 mg/dL — ABNORMAL LOW (ref 8.9–10.3)
Chloride: 94 mmol/L — ABNORMAL LOW (ref 98–111)
Chloride: 94 mmol/L — ABNORMAL LOW (ref 98–111)
Chloride: 91 mmol/L — ABNORMAL LOW (ref 98–111)
Creatinine, Ser: 1.51 mg/dL — ABNORMAL HIGH (ref 0.44–1.00)
Creatinine, Ser: 1.6 mg/dL — ABNORMAL HIGH (ref 0.44–1.00)
Creatinine, Ser: 1.42 mg/dL — ABNORMAL HIGH (ref 0.44–1.00)
GFR, Estimated: 35 mL/min — ABNORMAL LOW (ref >60)
GFR, Estimated: 33 mL/min — ABNORMAL LOW (ref >60)
GFR, Estimated: 38 mL/min — ABNORMAL LOW (ref >60)
Glucose, Bld: 731 mg/dL (ref 70–99)
Glucose, Bld: 236 mg/dL — ABNORMAL HIGH (ref 70–99)
Glucose, Bld: 717 mg/dL (ref 70–99)
Potassium: 3.9 mmol/L (ref 3.5–5.1)
Potassium: 2.9 mmol/L — ABNORMAL LOW (ref 3.5–5.1)
Potassium: 4.4 mmol/L (ref 3.5–5.1)
Sodium: 130 mmol/L — ABNORMAL LOW (ref 135–145)
Sodium: 133 mmol/L — ABNORMAL LOW (ref 135–145)
Sodium: 130 mmol/L — ABNORMAL LOW (ref 135–145)

## 2023-11-16 LAB — HEPATIC FUNCTION PANEL
ALT: 18 U/L (ref 0–44)
AST: 27 U/L (ref 15–41)
Albumin: 2.8 g/dL — ABNORMAL LOW (ref 3.5–5.0)
Alkaline Phosphatase: 53 U/L (ref 38–126)
Bilirubin, Direct: 0.4 mg/dL — ABNORMAL HIGH (ref 0.0–0.2)
Indirect Bilirubin: 0.9 mg/dL (ref 0.3–0.9)
Total Bilirubin: 1.3 mg/dL — ABNORMAL HIGH (ref 0.0–1.2)
Total Protein: 4.9 g/dL — ABNORMAL LOW (ref 6.5–8.1)

## 2023-11-16 LAB — CBC
HCT: 24.2 % — ABNORMAL LOW (ref 36.0–46.0)
Hemoglobin: 8 g/dL — ABNORMAL LOW (ref 12.0–15.0)
MCH: 30.3 pg (ref 26.0–34.0)
MCHC: 33.1 g/dL (ref 30.0–36.0)
MCV: 91.7 fL (ref 80.0–100.0)
Platelets: 68 10*3/uL — ABNORMAL LOW (ref 150–400)
RBC: 2.64 MIL/uL — ABNORMAL LOW (ref 3.87–5.11)
RDW: 17.2 % — ABNORMAL HIGH (ref 11.5–15.5)
WBC: 9.7 10*3/uL (ref 4.0–10.5)
nRBC: 6.2 % — ABNORMAL HIGH (ref 0.0–0.2)

## 2023-11-16 LAB — GLUCOSE, CAPILLARY
Glucose-Capillary: 118 mg/dL — ABNORMAL HIGH (ref 70–99)
Glucose-Capillary: >600 mg/dL (ref 70–99)
Glucose-Capillary: 121 mg/dL — ABNORMAL HIGH (ref 70–99)
Glucose-Capillary: 110 mg/dL — ABNORMAL HIGH (ref 70–99)
Glucose-Capillary: 112 mg/dL — ABNORMAL HIGH (ref 70–99)
Glucose-Capillary: 132 mg/dL — ABNORMAL HIGH (ref 70–99)
Glucose-Capillary: 121 mg/dL — ABNORMAL HIGH (ref 70–99)
Glucose-Capillary: 116 mg/dL — ABNORMAL HIGH (ref 70–99)
Glucose-Capillary: 119 mg/dL — ABNORMAL HIGH (ref 70–99)

## 2023-11-16 LAB — CULTURE, BLOOD (ROUTINE X 2)
Culture: NO GROWTH
Culture: NO GROWTH

## 2023-11-16 LAB — MAGNESIUM
Magnesium: 2.7 mg/dL — ABNORMAL HIGH (ref 1.7–2.4)
Magnesium: 2 mg/dL (ref 1.7–2.4)

## 2023-11-16 LAB — PHOSPHORUS: Phosphorus: 1.3 mg/dL — ABNORMAL LOW (ref 2.5–4.6)

## 2023-11-16 MED ORDER — BUSPIRONE HCL 10 MG PO TABS: 5.0000 mg | ORAL_TABLET | Freq: Three times a day (TID) | ORAL | Status: DC

## 2023-11-16 MED ORDER — AMIODARONE HCL IN DEXTROSE 360-4.14 MG/200ML-% IV SOLN
30.0000 mg/h | INTRAVENOUS | Status: DC
  Administered 2023-11-17 – 2023-11-26 (×19): 30 mg/h via INTRAVENOUS
  Filled 2023-11-16 (×20): qty 200

## 2023-11-16 MED ORDER — NICOTINE 21 MG/24HR TD PT24
21.0000 mg | MEDICATED_PATCH | Freq: Every day | TRANSDERMAL | Status: DC
Start: 2023-11-16 — End: 2023-12-11
  Administered 2023-11-16 – 2023-12-11 (×24): 21 mg via TRANSDERMAL
  Filled 2023-11-16 (×26): qty 1

## 2023-11-16 MED ORDER — AMIODARONE HCL IN DEXTROSE 360-4.14 MG/200ML-% IV SOLN
60.0000 mg/h | INTRAVENOUS | Status: AC
  Administered 2023-11-16 (×2): 60 mg/h via INTRAVENOUS
  Filled 2023-11-16: qty 200

## 2023-11-16 MED ORDER — LORAZEPAM 2 MG/ML IJ SOLN
2.0000 mg | Freq: Once | INTRAMUSCULAR | Status: AC
  Administered 2023-11-16: 2 mg via INTRAVENOUS
  Filled 2023-11-16: qty 1

## 2023-11-16 MED ORDER — POTASSIUM CHLORIDE 20 MEQ PO PACK
40.0000 meq | PACK | Freq: Once | ORAL | Status: AC
  Administered 2023-11-16: 40 meq
  Filled 2023-11-16: qty 2

## 2023-11-16 MED ORDER — GABAPENTIN 250 MG/5ML PO SOLN
100.0000 mg | Freq: Two times a day (BID) | ORAL | Status: DC
  Administered 2023-11-16 – 2023-11-18 (×4): 100 mg
  Filled 2023-11-16 (×4): qty 2

## 2023-11-16 MED ORDER — HYDROMORPHONE HCL 1 MG/ML IJ SOLN
0.5000 mg | Freq: Once | INTRAMUSCULAR | Status: AC
  Administered 2023-11-17: 0.5 mg via INTRAVENOUS
  Filled 2023-11-16: qty 0.5

## 2023-11-16 MED ORDER — MAGNESIUM SULFATE 2 GM/50ML IV SOLN: 2.0000 g | Freq: Once | INTRAVENOUS | Status: DC

## 2023-11-16 MED ORDER — HYDROMORPHONE HCL-NACL 50-0.9 MG/50ML-% IV SOLN
0.5000 mg/h | INTRAVENOUS | Status: DC
  Administered 2023-11-16: 0.5 mg/h via INTRAVENOUS
  Filled 2023-11-16: qty 50

## 2023-11-16 MED ORDER — AMIODARONE LOAD VIA INFUSION
150.0000 mg | Freq: Once | INTRAVENOUS | Status: AC
  Administered 2023-11-16: 150 mg via INTRAVENOUS
  Filled 2023-11-16: qty 83.34

## 2023-11-16 MED ORDER — SODIUM PHOSPHATES 45 MMOLE/15ML IV SOLN
30.0000 mmol | Freq: Once | INTRAVENOUS | Status: AC
  Administered 2023-11-16: 30 mmol via INTRAVENOUS
  Filled 2023-11-16: qty 10

## 2023-11-16 MED ORDER — ARTIFICIAL TEARS OPHTHALMIC OINT
TOPICAL_OINTMENT | Freq: Four times a day (QID) | OPHTHALMIC | Status: DC
  Filled 2023-11-16: qty 3.5

## 2023-11-16 MED ORDER — HYDROMORPHONE BOLUS VIA INFUSION
0.2500 mg | INTRAVENOUS | Status: DC | PRN
  Administered 2023-11-16 (×3): 0.25 mg via INTRAVENOUS
  Administered 2023-11-16: 0.5 mg via INTRAVENOUS

## 2023-11-16 MED ORDER — TRACE MINERALS CU-MN-SE-ZN 300-55-60-3000 MCG/ML IV SOLN
INTRAVENOUS | Status: AC
  Filled 2023-11-16: qty 571.2

## 2023-11-16 MED ORDER — BUSPIRONE HCL 10 MG PO TABS
5.0000 mg | ORAL_TABLET | Freq: Two times a day (BID) | ORAL | Status: DC
  Administered 2023-11-16 – 2023-11-18 (×5): 5 mg
  Filled 2023-11-16 (×5): qty 1

## 2023-11-16 MED ORDER — POTASSIUM CHLORIDE 10 MEQ/50ML IV SOLN
10.0000 meq | INTRAVENOUS | Status: AC
  Administered 2023-11-16 (×4): 10 meq via INTRAVENOUS
  Filled 2023-11-16 (×4): qty 50

## 2023-11-16 NOTE — Progress Notes (Signed)
 NAME:  Jenny White, MRN:  [id number], DOB:  07/06/1946, LOS: 5 ADMISSION DATE:  11/11/2023 CONSULTATION DATE:  11/11/2023 REFERRING MD:  Dwain Sarna - CCS CHIEF COMPLAINT: Shock  History of Present Illness:  Jenny White 78 year old woman with a pmhx significant for COPD, GERD, nephrolithiasis , HTN, OSA, Lung cancer ( Adenocarcinoma, diagnosed 05/2023) who presented to the emergency room with complaints of abdominal pain with N/V for the past week. She states the abdominal  pain got worsened after dinner. She reported experiencing nausea and  vomiting this morning.   In the ED  her labs were significant for a lactate 4.1 After fluid resuscitation, Lactate decreased to 2.5. CT scan showed high grade small bowel obstruction with transition point in the right lower quadrant along the distal ileum. Some trace of ascites and mesenteric stranding.Concern for bowel wall ischemia. Broad spectrum antibiotics ( zosyn) was started, blood cultures pending.   Patient was taken to the ER emergently with CCS 3/4 for Ex-lap / LOA. Intraoperative course  was notable for free fluid on entry, with closed loop bowel obstruction noted secondary to tight band adhesion. Patient left in discontinuity/ anastomosis not completed with open abdomen and plan for second look in 24-48 hrs. Patient remained intubated and was transferred to ICU in critical condition.   Pertinent Medical History:   Past Medical History:  Diagnosis Date   Arthritis    in back   COPD (chronic obstructive pulmonary disease) (HCC)    Difficult airway    Due to limited oral opening. Elective glidescope used previously.   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 Elevated LA found to have SBO taken emergently to surgery for EX-LAP.   Interim History / Subjective:  Anxious and increased WOB again on SBT.  Objective:  Blood pressure 111/66, pulse (!) 107, temperature 98.5 F (36.9 C), temperature source Oral, resp. rate (!) 24, height 5\' 7"  (1.702 m), weight 52.7 kg, SpO2 100%.    Vent Mode: PRVC FiO2 (%):  [40 %] 40 % Set Rate:  [20 bmp] 20 bmp Vt Set:  [490 mL] 490 mL PEEP:  [5 cmH20] 5 cmH20 Pressure Support:  [8 cmH20] 8 cmH20 Plateau Pressure:  [16 cmH20] 16 cmH20   Intake/Output Summary (Last 24 hours) at 11/16/2023 1458 Last data filed at 11/16/2023 1400 Gross per 24 hour  Intake 3429.52 ml  Output 7160 ml  Net -3730.48 ml   Filed Weights   11/12/23 0500 11/15/23 0500 11/16/23 0500  Weight: 59 kg 63.8 kg 52.7 kg   Physical Examination: Anxious Follows commands Thick secretions + accessory muscle use Abd soft, +stool in ostomy  Cr stable Plts down slightly   Assessment & Plan:  High grade SBO w/ bowel ischemia w/ peritonitis- ex lap, SB resection, abthera 3/4, further resection, end ileostomy, and abd wall closure 3/6 Postop vasoplegia/ septic shock- improved Advanced COPD RLL Lung cancer- s/p SBRT with good response Need for sedation with mechanical ventilation Baseline chronic pain/anxiety- complicates sedation wean Acute kidney injury in  context of peritonitis- stable/improved Pulmonary edema ?Afib- check EKG, if real, ablate as poor AC candidate  Failed SBT due to WOB, hypoxemia, CXR looks wet - Zosyn x 5 days from closure - Start per tube anxiety meds - TPN - Continue to push diuretics - Check tracheal aspirate - Triple neb therapy - Guarded prognosis, family very realistic; will take things a day at a time  Best Practice: (right click and "Reselect all SmartList Selections" daily)   Diet/type:  TPN DVT prophylaxis: heparin dvt ppx: DC if plts down tomorrow GI prophylaxis: PPI Lines: Arterial Line and PICC line Foley:  Yes, and it is still needed Code Status:  full code Last date of multidisciplinary goals of care discussion: 3/7  33 min cc time Myrla Halsted MD PCCM

## 2023-11-16 NOTE — Plan of Care (Signed)
  Problem: Clinical Measurements: Goal: Will remain free from infection Outcome: Progressing   Problem: Nutrition: Goal: Adequate nutrition will be maintained Outcome: Progressing   Problem: Elimination: Goal: Will not experience complications related to bowel motility Outcome: Progressing   Problem: Pain Managment: Goal: General experience of comfort will improve and/or be controlled Outcome: Progressing   Problem: Fluid Volume: Goal: Ability to maintain a balanced intake and output will improve Outcome: Progressing   Problem: Health Behavior/Discharge Planning: Goal: Ability to identify and utilize available resources and services will improve Outcome: Progressing Goal: Ability to manage health-related needs will improve Outcome: Progressing   Problem: Metabolic: Goal: Ability to maintain appropriate glucose levels will improve Outcome: Progressing   Problem: Nutritional: Goal: Maintenance of adequate nutrition will improve Outcome: Progressing   Problem: Tissue Perfusion: Goal: Adequacy of tissue perfusion will improve Outcome: Progressing

## 2023-11-16 NOTE — Progress Notes (Signed)
 PHARMACY - TOTAL PARENTERAL NUTRITION CONSULT NOTE   Indication:  Ischemic bowel s/p small bowel resection, end ileostomy  Patient Measurements: Height: 5\' 7"  (170.2 cm) Weight: 52.7 kg (116 lb 2.9 oz) IBW/kg (Calculated) : 61.6 TPN AdjBW (KG): 54.4 Body mass index is 18.2 kg/m. Usual Weight: 58 kg   Assessment:  78 year old female multiple medical problems who has a prior splenectomy who presented to the emergency room with complaints of abdominal pain with N/V for the past week. She states the abdominal pain got worsened after dinner. She reported experiencing nausea and  vomiting this morning. CT scan showed high grade small bowel obstruction with transition point in the right lower quadrant along the distal ileum. Some trace of ascites and mesenteric stranding. Concern for bowel wall ischemia. Broad spectrum antibiotics (zosyn) was started, blood cultures pending. Remains on pressor support and enteral nutrition now on hold. Pharmacy consulted to initiate TPN.   Glucose / Insulin: x1 decadron dose 3/05; A1c 5.8%, CBGs 90-120s, no insulin/24 hr Electrolytes: K 3.9, Mg 2.7 up, Phos 1.3 down, Na 130, CO2 26, COCa 9.9, others wnl Renal: AKI on CKD, improving Scr down 1.51 (bsl ~1), BUN 53 Hepatic: alb 2.8, AST/ALT, Alk phos wnl, Tbili 1.3 up Intake / Output; MIVF: ostomy output 450 mL, UOP 4.7 mL/kg/hr  GI Imaging:  3/04 high-grade small-bowel obstruction is with transition point in the right lower quadrant along the distal ileum. There is some trace ascites and some mesenteric stranding GI Surgeries / Procedures:  3/04 ex lap, small bowel resection, open abdomen with bowel in discontinuity  3/06 small bowel resection, end ileostomy, closure of abdomen  Central access: PICC 11/12/23 TPN start date: 11/14/2023  Nutritional Goals: Goal TPN rate is 60 mL/hr (provides 90 g of protein and 1658 kcals per day)  RD Assessment: Estimated Needs Total Energy Estimated Needs: 1650-1850  kcals Total Protein Estimated Needs: 85-105 g Total Fluid Estimated Needs: >/= 1.7 L  Current Nutrition:  NPO  Plan:  Continue TPN to 60 mL/hr at 1800 to meet 100% goal kcal and AA Electrolytes in TPN: Na 44mEq/L, K 25 mEq/L, Ca 51mEq/L, Mg to 0 mEq/L, and Phos to 10 mmol/L. Cl:Ac 1:1 Na Phos IV 30 mmol x1 outside of TPN (3/09) Add standard MVI and trace elements to TPN Continue Sensitive q6h SSI and adjust as needed  Monitor TPN labs on Mon/Thurs, daily as needed  Thank you for allowing pharmacy to be a part of this patient's care.  Thelma Barge, PharmD, BCPS Clinical Pharmacist

## 2023-11-16 NOTE — Progress Notes (Signed)
 3 Days Post-Op   Subjective/Chief Complaint: Intubated, sedated, does arouse   Objective: Vital signs in last 24 hours: Temp:  [97.5 F (36.4 C)-97.9 F (36.6 C)] 97.9 F (36.6 C) (03/09 0400) Pulse Rate:  [71-124] 86 (03/09 0645) Resp:  [10-37] 24 (03/09 0645) BP: (53-174)/(33-132) 142/65 (03/09 0645) SpO2:  [88 %-100 %] 100 % (03/09 0645) Arterial Line BP: (53-267)/(28-257) 64/60 (03/08 1602) FiO2 (%):  [40 %] 40 % (03/09 0153) Weight:  [52.7 kg] 52.7 kg (03/09 0500) Last BM Date : 11/15/23  Intake/Output from previous day: 03/08 0701 - 03/09 0700 In: 3106.2 [I.V.:1802.2; IV Piggyback:1303.9] Out: 6375 [Urine:5925; Stool:450] Intake/Output this shift: No intake/output data recorded.  Ab mild distended dressing with some drainage, ostomy pink and functional  Lab Results:  Recent Labs    11/15/23 1513 11/16/23 0419  WBC 10.8* 9.7  HGB 9.1* 8.0*  HCT 27.0* 24.2*  PLT 79* 68*   BMET Recent Labs    11/15/23 1513 11/16/23 0419  NA 133* 130*  K 3.9 3.9  CL 97* 94*  CO2 23 26  GLUCOSE 107* 731*  BUN 56* 53*  CREATININE 1.64* 1.51*  CALCIUM 8.4* 8.9   PT/INR No results for input(s): "LABPROT", "INR" in the last 72 hours. ABG Recent Labs    11/13/23 1057 11/14/23 1546  PHART 7.315* 7.334*  HCO3 23.0 23.9    Studies/Results: DG Chest Port 1 View Result Date: 11/15/2023 CLINICAL DATA:  ET tube placement EXAM: PORTABLE CHEST 1 VIEW COMPARISON:  X-ray 11/14/2023 FINDINGS: Stable ET tube, enteric tube and right-sided PICC. Hyperinflation. Persistent left retrocardiac opacity. Small pleural effusions. Stable cardiopericardial silhouette calcified aorta. Marker again seen in the medial left upper lobe. No pneumothorax. Overlapping cardiac leads. Apical pleural thickening. IMPRESSION: No significant interval change when adjusting for technique. Electronically Signed   By: Karen Kays M.D.   On: 11/15/2023 10:13   DG Chest Port 1 View Result Date:  11/14/2023 CLINICAL DATA:  Intubation EXAM: PORTABLE CHEST 1 VIEW COMPARISON:  X-ray 11/12/2023 and older FINDINGS: The extreme lung apices are clipped off the edge of the film. ET tube in place. Enteric tube with tip extending beneath the diaphragm. Right-sided PICC with tip along the central SVC above the right atrium. Hyperinflation. No pneumothorax or edema. Chronic lung changes suggested. Tiny bilateral pleural effusions are seen with some focal left retrocardiac opacity. These are new from previous. Overlapping cardiac leads. Area of nodularity left upper lung with a marker is again noted. Nodular area at the right lung base as well. IMPRESSION: New right-sided PICC. Developing small bilateral pleural effusions and left retrocardiac opacity. Acute process is possible. Recommend follow-up. Electronically Signed   By: Karen Kays M.D.   On: 11/14/2023 17:59    Anti-infectives: Anti-infectives (From admission, onward)    Start     Dose/Rate Route Frequency Ordered Stop   11/15/23 1400  piperacillin-tazobactam (ZOSYN) IVPB 3.375 g        3.375 g 100 mL/hr over 30 Minutes Intravenous Every 8 hours 11/15/23 1223 11/23/23 0559   11/11/23 2000  piperacillin-tazobactam (ZOSYN) IVPB 3.375 g  Status:  Discontinued        3.375 g 12.5 mL/hr over 240 Minutes Intravenous Every 8 hours 11/11/23 1233 11/15/23 1223   11/11/23 1245  piperacillin-tazobactam (ZOSYN) IVPB 3.375 g        3.375 g 100 mL/hr over 30 Minutes Intravenous  Once 11/11/23 1233 11/11/23 1316       Assessment/Plan: POD 5/3 sbr, open  abdomen followed by sbr, ileostomy, closure- MW -still on pressors while on vent -would continue ng and npo from my standpoint -hold on enteral feeds,tpn on -zoysn d3/5  -cr improving, very positive whole admission but negative yesterday so hopefully this will help with extubation (COPD obviously limiting also) -sub heparin, scds -appreciate ccm care -wicks in wound out on Monday and honeycomb  off Emelia Loron 11/16/2023

## 2023-11-17 DIAGNOSIS — K56600 Partial intestinal obstruction, unspecified as to cause: Secondary | ICD-10-CM | POA: Diagnosis not present

## 2023-11-17 DIAGNOSIS — I4891 Unspecified atrial fibrillation: Secondary | ICD-10-CM | POA: Diagnosis not present

## 2023-11-17 LAB — POCT I-STAT EG7
Acid-base deficit: 1 mmol/L (ref 0.0–2.0)
Bicarbonate: 23.5 mmol/L (ref 20.0–28.0)
Calcium, Ion: 0.65 mmol/L — CL (ref 1.15–1.40)
HCT: 23 % — ABNORMAL LOW (ref 36.0–46.0)
Hemoglobin: 7.8 g/dL — ABNORMAL LOW (ref 12.0–15.0)
O2 Saturation: 62 %
Patient temperature: 98.2
Potassium: 3.2 mmol/L — ABNORMAL LOW (ref 3.5–5.1)
Sodium: 141 mmol/L (ref 135–145)
TCO2: 25 mmol/L (ref 22–32)
pCO2, Ven: 34.9 mmHg — ABNORMAL LOW (ref 44–60)
pH, Ven: 7.436 — ABNORMAL HIGH (ref 7.25–7.43)
pO2, Ven: 30 mmHg — CL (ref 32–45)

## 2023-11-17 LAB — BASIC METABOLIC PANEL
Anion gap: 13 (ref 5–15)
Anion gap: 14 (ref 5–15)
BUN: 56 mg/dL — ABNORMAL HIGH (ref 8–23)
BUN: 53 mg/dL — ABNORMAL HIGH (ref 8–23)
CO2: 29 mmol/L (ref 22–32)
CO2: 29 mmol/L (ref 22–32)
Calcium: 8.6 mg/dL — ABNORMAL LOW (ref 8.9–10.3)
Calcium: 8.7 mg/dL — ABNORMAL LOW (ref 8.9–10.3)
Chloride: 93 mmol/L — ABNORMAL LOW (ref 98–111)
Chloride: 91 mmol/L — ABNORMAL LOW (ref 98–111)
Creatinine, Ser: 1.55 mg/dL — ABNORMAL HIGH (ref 0.44–1.00)
Creatinine, Ser: 1.44 mg/dL — ABNORMAL HIGH (ref 0.44–1.00)
GFR, Estimated: 34 mL/min — ABNORMAL LOW (ref >60)
GFR, Estimated: 37 mL/min — ABNORMAL LOW (ref >60)
Glucose, Bld: 230 mg/dL — ABNORMAL HIGH (ref 70–99)
Glucose, Bld: 590 mg/dL (ref 70–99)
Potassium: 3.6 mmol/L (ref 3.5–5.1)
Potassium: 4.2 mmol/L (ref 3.5–5.1)
Sodium: 135 mmol/L (ref 135–145)
Sodium: 134 mmol/L — ABNORMAL LOW (ref 135–145)

## 2023-11-17 LAB — COMPREHENSIVE METABOLIC PANEL
ALT: 24 U/L (ref 0–44)
AST: 30 U/L (ref 15–41)
Albumin: 2.6 g/dL — ABNORMAL LOW (ref 3.5–5.0)
Alkaline Phosphatase: 77 U/L (ref 38–126)
Anion gap: 12 (ref 5–15)
BUN: 56 mg/dL — ABNORMAL HIGH (ref 8–23)
CO2: 27 mmol/L (ref 22–32)
Calcium: 8.6 mg/dL — ABNORMAL LOW (ref 8.9–10.3)
Chloride: 92 mmol/L — ABNORMAL LOW (ref 98–111)
Creatinine, Ser: 1.49 mg/dL — ABNORMAL HIGH (ref 0.44–1.00)
GFR, Estimated: 36 mL/min — ABNORMAL LOW (ref >60)
Glucose, Bld: 490 mg/dL — ABNORMAL HIGH (ref 70–99)
Potassium: 4.2 mmol/L (ref 3.5–5.1)
Sodium: 131 mmol/L — ABNORMAL LOW (ref 135–145)
Total Bilirubin: 0.7 mg/dL (ref 0.0–1.2)
Total Protein: 4.9 g/dL — ABNORMAL LOW (ref 6.5–8.1)

## 2023-11-17 LAB — CBC
HCT: 26.9 % — ABNORMAL LOW (ref 36.0–46.0)
Hemoglobin: 8.7 g/dL — ABNORMAL LOW (ref 12.0–15.0)
MCH: 30.2 pg (ref 26.0–34.0)
MCHC: 32.3 g/dL (ref 30.0–36.0)
MCV: 93.4 fL (ref 80.0–100.0)
Platelets: 92 10*3/uL — ABNORMAL LOW (ref 150–400)
RBC: 2.88 MIL/uL — ABNORMAL LOW (ref 3.87–5.11)
RDW: 17.8 % — ABNORMAL HIGH (ref 11.5–15.5)
WBC: 12.9 10*3/uL — ABNORMAL HIGH (ref 4.0–10.5)
nRBC: 2.6 % — ABNORMAL HIGH (ref 0.0–0.2)

## 2023-11-17 LAB — MAGNESIUM
Magnesium: 2 mg/dL (ref 1.7–2.4)
Magnesium: 1.8 mg/dL (ref 1.7–2.4)

## 2023-11-17 LAB — GLUCOSE, CAPILLARY
Glucose-Capillary: 103 mg/dL — ABNORMAL HIGH (ref 70–99)
Glucose-Capillary: 111 mg/dL — ABNORMAL HIGH (ref 70–99)
Glucose-Capillary: 130 mg/dL — ABNORMAL HIGH (ref 70–99)
Glucose-Capillary: 132 mg/dL — ABNORMAL HIGH (ref 70–99)
Glucose-Capillary: 156 mg/dL — ABNORMAL HIGH (ref 70–99)
Glucose-Capillary: 166 mg/dL — ABNORMAL HIGH (ref 70–99)
Glucose-Capillary: 227 mg/dL — ABNORMAL HIGH (ref 70–99)

## 2023-11-17 LAB — TRIGLYCERIDES
Triglycerides: 233 mg/dL — ABNORMAL HIGH (ref <150)
Triglycerides: 75 mg/dL (ref <150)

## 2023-11-17 LAB — PHOSPHORUS
Phosphorus: 3.5 mg/dL (ref 2.5–4.6)
Phosphorus: 2.9 mg/dL (ref 2.5–4.6)

## 2023-11-17 LAB — SURGICAL PATHOLOGY

## 2023-11-17 MED ORDER — TRACE MINERALS CU-MN-SE-ZN 300-55-60-3000 MCG/ML IV SOLN
INTRAVENOUS | Status: AC
  Filled 2023-11-17: qty 572

## 2023-11-17 MED ORDER — NOREPINEPHRINE 16 MG/250ML-% IV SOLN
0.0000 ug/min | INTRAVENOUS | Status: DC
Start: 2023-11-17 — End: 2023-11-19
  Administered 2023-11-17: 18 ug/min via INTRAVENOUS
  Administered 2023-11-17: 2 ug/min via INTRAVENOUS
  Filled 2023-11-17 (×2): qty 250

## 2023-11-17 MED ORDER — ACETAMINOPHEN 10 MG/ML IV SOLN
1000.0000 mg | Freq: Once | INTRAVENOUS | Status: AC
Start: 2023-11-17 — End: 2023-11-17
  Administered 2023-11-17: 1000 mg via INTRAVENOUS
  Filled 2023-11-17: qty 100

## 2023-11-17 MED ORDER — HYDROMORPHONE HCL 1 MG/ML IJ SOLN
0.5000 mg | INTRAMUSCULAR | Status: DC | PRN
  Administered 2023-11-17 – 2023-11-18 (×4): 0.5 mg via INTRAVENOUS
  Filled 2023-11-17 (×5): qty 0.5

## 2023-11-17 MED ORDER — MAGNESIUM SULFATE 2 GM/50ML IV SOLN
2.0000 g | Freq: Once | INTRAVENOUS | Status: AC
  Administered 2023-11-17: 2 g via INTRAVENOUS
  Filled 2023-11-17: qty 50

## 2023-11-17 MED ORDER — POTASSIUM CHLORIDE 20 MEQ PO PACK
40.0000 meq | PACK | Freq: Once | ORAL | Status: AC
  Administered 2023-11-17: 40 meq via ORAL
  Filled 2023-11-17: qty 2

## 2023-11-17 MED ORDER — SODIUM CHLORIDE 0.9 % IV SOLN
1.0000 g | Freq: Two times a day (BID) | INTRAVENOUS | Status: DC
  Administered 2023-11-17 – 2023-11-23 (×13): 1 g via INTRAVENOUS
  Filled 2023-11-17 (×14): qty 20

## 2023-11-17 NOTE — Procedures (Signed)
 Intubation Procedure Note  IRELYND ZUMSTEIN  [id number]  Jul 04, 1946  Date:11/17/23  Time:11:13 AM   Provider Performing:Kinberly Perris    Procedure: Intubation (31500)  Indication(s) Respiratory Failure  Consent Risks of the procedure as well as the alternatives and risks of each were explained to the patient and/or caregiver.  Consent for the procedure was obtained and is signed in the bedside chart   Anesthesia Etomidate and Rocuronium   Time Out Verified patient identification, verified procedure, site/side was marked, verified correct patient position, special equipment/implants available, medications/allergies/relevant history reviewed, required imaging and test results available.   Sterile Technique Usual hand hygeine, masks, and gloves were used   Procedure Description Patient positioned in bed supine.  Sedation given as noted above.  Patient was intubated with endotracheal tube using  DL .  View was Grade 1 full glottis .  Number of attempts was 1.  Colorimetric CO2 detector was consistent with tracheal placement.   Complications/Tolerance None; patient tolerated the procedure well. Chest X-ray is ordered to verify placement.   EBL Minimal   Specimen(s) None

## 2023-11-17 NOTE — Progress Notes (Signed)
 eLink Physician-Brief Progress Note Patient Name: Jenny White DOB: 04/12/1946 MRN: [id number]   Date of Service  11/17/2023  HPI/Events of Note  Pt has gone back into Afib. HR 90-110. Remains on Amio gtt. wondering about anticoagulation  eICU Interventions  On subcu heparin.  Defer decision to initiate systemic coagulation to primary team and surgery.   0505 -KCl ordered, K2.8, ionized calcium 0.65.  Calcium ordered.  TPN adjustment deferred to primary team  0558 -room from BiPAP earlier and had significant expectorated secretions.  Currently on 4 L saturating well.  Comfortable on bedside examination.  Will maintain off BiPAP for break  Intervention Category Intermediate Interventions: Arrhythmia - evaluation and management  Aarthi Uyeno 11/17/2023, 10:22 PM

## 2023-11-17 NOTE — Progress Notes (Signed)
 eLink Physician-Brief Progress Note Patient Name: Jenny White DOB: 11-05-1945 MRN: [id number]   Date of Service  11/17/2023  HPI/Events of Note  POD 5/3 sbr, open abdomen followed by sbr, ileostomy, closure  101.2, strict npo per surgery  eICU Interventions  Tylenol x1 IV. Resp culture 3/9, hold bc for now.      Intervention Category Minor Interventions: Routine modifications to care plan (e.g. PRN medications for pain, fever)  Tanice Petre 11/17/2023, 1:04 AM

## 2023-11-17 NOTE — Progress Notes (Signed)
 Progress Note  4 Days Post-Op  Subjective: On Bipap. Having ileostomy output, NGT low volume   Objective: Vital signs in last 24 hours: Temp:  [98.2 F (36.8 C)-101.3 F (38.5 C)] 98.2 F (36.8 C) (03/10 0800) Pulse Rate:  [67-132] 73 (03/10 0830) Resp:  [17-31] 18 (03/10 0830) BP: (65-162)/(35-90) 89/43 (03/10 0830) SpO2:  [93 %-100 %] 97 % (03/10 0830) FiO2 (%):  [30 %-40 %] 30 % (03/10 0825) Weight:  [52.7 kg] 52.7 kg (03/10 0600) Last BM Date : 11/17/23  Intake/Output from previous day: 03/09 0701 - 03/10 0700 In: 3459.8 [I.V.:2780.2; IV Piggyback:679.6] Out: 4780 [Urine:4180; Emesis/NG output:50; Stool:550] Intake/Output this shift: Total I/O In: 84.7 [I.V.:84.7] Out: 300 [Urine:250; Emesis/NG output:50]  PE: General: pleasant, WD, chronically ill appearing female Heart: regular, rate, and rhythm.   Lungs: BiPAP Abd: soft, ileostomy viable with liquid effluent, NGT with thin bilious drainage, wicks removed from midline incision, no cellulitis  Psych: A&Ox3 with an appropriate affect.    Lab Results:  Recent Labs    11/16/23 0419 11/17/23 0424  WBC 9.7 12.9*  HGB 8.0* 8.7*  HCT 24.2* 26.9*  PLT 68* 92*   BMET Recent Labs    11/17/23 0424 11/17/23 0817  NA 131* 135  K 4.2 3.6  CL 92* 93*  CO2 27 29  GLUCOSE 490* 230*  BUN 56* 56*  CREATININE 1.49* 1.55*  CALCIUM 8.6* 8.6*   PT/INR No results for input(s): "LABPROT", "INR" in the last 72 hours. CMP     Component Value Date/Time   NA 135 11/17/2023 0817   K 3.6 11/17/2023 0817   CL 93 (L) 11/17/2023 0817   CO2 29 11/17/2023 0817   GLUCOSE 230 (H) 11/17/2023 0817   BUN 56 (H) 11/17/2023 0817   CREATININE 1.55 (H) 11/17/2023 0817   CREATININE 0.68 10/15/2023 1137   CALCIUM 8.6 (L) 11/17/2023 0817   PROT 4.9 (L) 11/17/2023 0424   ALBUMIN 2.6 (L) 11/17/2023 0424   AST 30 11/17/2023 0424   AST 20 10/15/2023 1137   ALT 24 11/17/2023 0424   ALT 15 10/15/2023 1137   ALKPHOS 77 11/17/2023  0424   BILITOT 0.7 11/17/2023 0424   BILITOT 0.4 10/15/2023 1137   GFRNONAA 34 (L) 11/17/2023 0817   GFRNONAA >60 10/15/2023 1137   Lipase     Component Value Date/Time   LIPASE 44 11/11/2023 1127       Studies/Results: DG Chest Port 1 View Result Date: 11/15/2023 CLINICAL DATA:  ET tube placement EXAM: PORTABLE CHEST 1 VIEW COMPARISON:  X-ray 11/14/2023 FINDINGS: Stable ET tube, enteric tube and right-sided PICC. Hyperinflation. Persistent left retrocardiac opacity. Small pleural effusions. Stable cardiopericardial silhouette calcified aorta. Marker again seen in the medial left upper lobe. No pneumothorax. Overlapping cardiac leads. Apical pleural thickening. IMPRESSION: No significant interval change when adjusting for technique. Electronically Signed   By: Karen Kays M.D.   On: 11/15/2023 10:13    Anti-infectives: Anti-infectives (From admission, onward)    Start     Dose/Rate Route Frequency Ordered Stop   11/17/23 1000  meropenem (MERREM) 1 g in sodium chloride 0.9 % 100 mL IVPB        1 g 200 mL/hr over 30 Minutes Intravenous Every 12 hours 11/17/23 0832     11/15/23 1400  piperacillin-tazobactam (ZOSYN) IVPB 3.375 g  Status:  Discontinued        3.375 g 100 mL/hr over 30 Minutes Intravenous Every 8 hours 11/15/23 1223 11/17/23 8127  11/11/23 2000  piperacillin-tazobactam (ZOSYN) IVPB 3.375 g  Status:  Discontinued        3.375 g 12.5 mL/hr over 240 Minutes Intravenous Every 8 hours 11/11/23 1233 11/15/23 1223   11/11/23 1245  piperacillin-tazobactam (ZOSYN) IVPB 3.375 g        3.375 g 100 mL/hr over 30 Minutes Intravenous  Once 11/11/23 1233 11/11/23 1316        Assessment/Plan SBO  POD 5/3 S/P ex-lap small bowel resection, left open; ex-lap, small bowel resection, ileostomy and closure - ileostomy viable and putting out liquid  - NGT with low volume, thin bilious output - continue on LIWS while on BiPAP - wicks removed from midline wound today   - continue  TPN for now  - continue abx for now  FEN: NPO, TPN, NGT to LIWS VTE: SQH ID: Merrem  - per CCM -  Septic shock - improved Advanced COPD - on BiPAP RLL Lung CA Reactive A. Fib - converted on amio HTN GERD  LOS: 6 days     Juliet Rude, [facility] 11/17/2023, 9:36 AM Please see Amion for pager number during day hours 7:00am-4:30pm

## 2023-11-17 NOTE — TOC Progression Note (Signed)
 Transition of Care [facility]) - Progression Note    Patient Details  Name: Jenny White MRN: [id number] Date of Birth: Mar 02, 1946  Transition of [facility]) CM/SW Contact  Graves-Bigelow, Lamar Laundry, RN Phone Number: 11/17/2023, 11:41 AM  Clinical Narrative: Patient discussed in progression rounds-Extubated today and daughter at the bedside. Case Manager continuing to follow for transition of care needs as the patient progresses.     Expected Discharge Plan: Home w Home Health Services Barriers to Discharge: Continued Medical Work up  Expected Discharge Plan and Services   Discharge Planning Services: CM Consult Post Acute Care Choice: Home Health Living arrangements for the past 2 months: Single Family Home  Social Determinants of Health (SDOH) Interventions SDOH Screenings   Food Insecurity: No Food Insecurity (11/11/2023)  Housing: Low Risk  (11/11/2023)  Transportation Needs: No Transportation Needs (11/11/2023)  Utilities: Not At Risk (11/11/2023)  Depression (PHQ2-9): Low Risk  (06/17/2023)  Financial Resource Strain: Low Risk  (11/06/2023)   Received from [facility]  Social Connections: Unknown (11/11/2023)  Tobacco Use: Medium Risk (11/11/2023)    Readmission Risk Interventions     No data to display

## 2023-11-17 NOTE — Progress Notes (Signed)
 NAME:  Jenny White, MRN:  [id number], DOB:  28-May-1946, LOS: 6 ADMISSION DATE:  11/11/2023 CONSULTATION DATE:  11/11/2023 REFERRING MD:  Dwain Sarna - CCS CHIEF COMPLAINT: Shock  History of Present Illness:  Jenny White 78 year old woman with a pmhx significant for COPD, GERD, nephrolithiasis , HTN, OSA, Lung cancer ( Adenocarcinoma, diagnosed 05/2023) who presented to the emergency room with complaints of abdominal pain with N/V for the past week. She states the abdominal  pain got worsened after dinner. She reported experiencing nausea and  vomiting this morning.   In the ED  her labs were significant for a lactate 4.1 After fluid resuscitation, Lactate decreased to 2.5. CT scan showed high grade small bowel obstruction with transition point in the right lower quadrant along the distal ileum. Some trace of ascites and mesenteric stranding.Concern for bowel wall ischemia. Broad spectrum antibiotics ( zosyn) was started, blood cultures pending.   Patient was taken to the ER emergently with CCS 3/4 for Ex-lap / LOA. Intraoperative course  was notable for free fluid on entry, with closed loop bowel obstruction noted secondary to tight band adhesion. Patient left in discontinuity/ anastomosis not completed with open abdomen and plan for second look in 24-48 hrs. Patient remained intubated and was transferred to ICU in critical condition.   Pertinent Medical History:   Past Medical History:  Diagnosis Date   Arthritis    in back   COPD (chronic obstructive pulmonary disease) (HCC)    Difficult airway    Due to limited oral opening. Elective glidescope used previously.   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 Elevated LA found to have SBO taken emergently to surgery for EX-LAP.   Interim History / Subjective:  Continues to diurese okay Marginal on SBT  Objective:  Blood pressure (!) 162/66, pulse 93, temperature 99.4 F (37.4 C), temperature source Axillary, resp. rate (!) 22, height 5\' 7"  (1.702 m), weight 52.7 kg, SpO2 97%.    Vent Mode: PRVC FiO2 (%):  [40 %] 40 % Set Rate:  [20 bmp] 20 bmp Vt Set:  [490 mL] 490 mL PEEP:  [5 cmH20] 5 cmH20 Pressure Support:  [8 cmH20] 8 cmH20 Plateau Pressure:  [17 cmH20-18 cmH20] 18 cmH20   Intake/Output Summary (Last 24 hours) at 11/17/2023 0826 Last data filed at 11/17/2023 0800 Gross per 24 hour  Intake 3440.1 ml  Output 4455 ml  Net -1014.9 ml   Filed Weights   11/15/23 0500 11/16/23 0500 11/17/23 0600  Weight: 63.8 kg 52.7 kg 52.7 kg   Physical Examination: Follows commands Secretion burden a bit better Still has rhonci Abd soft with ostomy and dark fluid Ext improved edema  Cr stable GNR on gram stain  tracheal aspirate  Assessment & Plan:  High grade SBO w/ bowel ischemia w/ peritonitis- ex lap, SB resection, abthera 3/4, further resection, end ileostomy, and abd wall closure 3/6 Postop vasoplegia/ septic shock- improved Advanced COPD RLL Lung cancer- s/p SBRT with good response Need for sedation with mechanical ventilation Baseline  chronic pain/anxiety- complicates sedation wean Acute kidney injury in  context of peritonitis- stable/improved Pulmonary edema Reactive Afib- converted on amio; if recurrence would do AC  - Continue amio, duration TBD - Start per tube anxiety meds, okay'd by CCS - TPN - Continue to push diuretics (buspar, gaba); repeat BMP at 1400 - f/u tracheal aspirate; given fevers and higher white count, secretion burden and GNR in sputum, will do meropenem pending speciation - Triple neb therapy for  now - Will try extubation to BIPAP to see what happens, don't think she will ever look good on SBT  Best Practice: (right click and "Reselect all SmartList Selections" daily)   Diet/type: TPN DVT prophylaxis: heparin dvt ppx GI prophylaxis: PPI Lines: Arterial Line and PICC line Foley:  Yes, and it is still needed Code Status:  full code Last date of multidisciplinary goals of care discussion: 3/8  33 min cc time Myrla Halsted MD PCCM

## 2023-11-17 NOTE — Progress Notes (Signed)
 PHARMACY - TOTAL PARENTERAL NUTRITION CONSULT NOTE   Indication:  Ischemic bowel s/p small bowel resection, end ileostomy  Patient Measurements: Height: 5\' 7"  (170.2 cm) Weight: 52.7 kg (116 lb 2.9 oz) IBW/kg (Calculated) : 61.6 TPN AdjBW (KG): 54.4 Body mass index is 18.2 kg/m. Usual Weight: 58 kg   Assessment:  78 year old woman with hx lung adenocarcinoma, prior splenectomy among others presented to the emergency room with complaints of abdominal pain with N/V for the past week. She states the abdominal pain got worsened after dinner. She reported experiencing nausea and vomiting this morning. CT scan showed high grade small bowel obstruction with transition point in the right lower quadrant along the distal ileum. Some trace of ascites and mesenteric stranding. Concern for bowel wall ischemia. Broad spectrum antibiotics (zosyn) was started, blood cultures pending. Remains on pressor support and enteral nutrition now on hold. Pharmacy consulted to initiate TPN.   BMP glucose and finger sticks are significantly different. Patient does not have hx of diabetes so would not expect BG to be in the 400s on TPN. Repeat labs 3/10 after holding TPN for 5 min and flushing well shows improvement in BG and now matching POC glucose. Labs with high glucose were likely contaminated with TPN.  Glucose / Insulin: A1c 5.8%, CBGs 90-120s, no insulin/24 hr Electrolytes: Na 135, K 3.6 (received 40 mEQ po + 40 mEq IV >> another 40 mEq ordered), Cl 93, Mg 1.8, Phos 2.9 (Received 30 mmol IV),  CoCa 9.7, others wnl Renal:  Scr 1.55  (bsl ~1), BUN 56 Hepatic: alb 2.6, Alk phos/AST/ALT/Tbili wnl, TG 75 Intake / Output; MIVF: albumin 25g q6hr; furosemide IV 60mg  Q8hr > UOP 3.3 mL/kg/hr, NGT 50 ml, ostomy 550 mL GI Imaging:  3/4 CT - high-grade SBO is with transition point in the RLQ along the distal ileum, cannot exclude bowel wall ischemia; distended esophagus 3/4 KUB: Dilated fluid-filled loops of bowel  GI  Surgeries / Procedures:  3/4 ex lap, small bowel resection, open abdomen with bowel in discontinuity  3/6 small bowel resection, end ileostomy, closure of abdomen  Central access: PICC 11/12/23 TPN start date: 11/14/2023  Nutritional Goals: Goal TPN rate is 55 mL/hr (provides 86 g of protein and 1653 kcals per day)  RD Assessment: Estimated Needs Total Energy Estimated Needs: 1650-1850 kcals Total Protein Estimated Needs: 85-105 g Total Fluid Estimated Needs: >/= 1.7 L  Current Nutrition:  NPO + TPN  Plan:  Continue TPN at goal 55 mL/hr at 1800 to meet 100% goal kcal and AA Electrolytes in TPN: Increase Na 100 mEq/L, Increase K 45 mEq/L, decrease Ca 3 mEq/L, Increase Mg 6 mEq/L, Increase Phos 15 mmol/L. Increase Cl:Ac 2:1  Give mag 2g IV x1 Add standard MVI and trace elements to TPN Continue Sensitive q6h SSI and adjust as needed  Monitor TPN labs on Mon/Thurs, daily as needed Watch diuresis plan   Thank you for allowing pharmacy to be a part of this patient's care.  Alphia Moh, PharmD, BCPS, BCCP Clinical Pharmacist  Please check AMION for all [facility] Pharmacy phone numbers After 10:00 PM, call Main Pharmacy [phone]

## 2023-11-17 NOTE — Plan of Care (Signed)
  Problem: Activity: Goal: Risk for activity intolerance will decrease Outcome: Not Progressing   Problem: Nutrition: Goal: Adequate nutrition will be maintained Outcome: Not Progressing   Problem: Coping: Goal: Level of anxiety will decrease Outcome: Not Progressing

## 2023-11-17 NOTE — Procedures (Addendum)
 Extubation Procedure Note  Patient Details:   Name: Jenny White DOB: 1946/07/29 MRN: [id number]   Airway Documentation:    Vent end date: 11/17/23 Vent end time: 0755   Evaluation  O2 sats: stable throughout Complications: No apparent complications Patient did tolerate procedure well. Bilateral Breath Sounds: Rhonchi, Diminished   No, pt able to cough. RT NTS pt directly after extubation with moderate return. Pt sounded much better after and placed directly on BiPAP per MD order. Pt positive for cuff leak prior to extubation  Tacy Learn 11/17/2023, 8:30 AM

## 2023-11-18 DIAGNOSIS — Z9889 Other specified postprocedural states: Secondary | ICD-10-CM | POA: Diagnosis not present

## 2023-11-18 DIAGNOSIS — K659 Peritonitis, unspecified: Secondary | ICD-10-CM | POA: Diagnosis not present

## 2023-11-18 DIAGNOSIS — I4891 Unspecified atrial fibrillation: Secondary | ICD-10-CM | POA: Diagnosis not present

## 2023-11-18 DIAGNOSIS — K56600 Partial intestinal obstruction, unspecified as to cause: Secondary | ICD-10-CM | POA: Diagnosis not present

## 2023-11-18 LAB — BASIC METABOLIC PANEL
Anion gap: 13 (ref 5–15)
Anion gap: 13 (ref 5–15)
BUN: 59 mg/dL — ABNORMAL HIGH (ref 8–23)
BUN: 57 mg/dL — ABNORMAL HIGH (ref 8–23)
CO2: 33 mmol/L — ABNORMAL HIGH (ref 22–32)
CO2: 33 mmol/L — ABNORMAL HIGH (ref 22–32)
Calcium: 9.1 mg/dL (ref 8.9–10.3)
Calcium: 10.7 mg/dL — ABNORMAL HIGH (ref 8.9–10.3)
Chloride: 97 mmol/L — ABNORMAL LOW (ref 98–111)
Chloride: 97 mmol/L — ABNORMAL LOW (ref 98–111)
Creatinine, Ser: 1.42 mg/dL — ABNORMAL HIGH (ref 0.44–1.00)
Creatinine, Ser: 1.5 mg/dL — ABNORMAL HIGH (ref 0.44–1.00)
GFR, Estimated: 38 mL/min — ABNORMAL LOW (ref >60)
GFR, Estimated: 35 mL/min — ABNORMAL LOW (ref >60)
Glucose, Bld: 133 mg/dL — ABNORMAL HIGH (ref 70–99)
Glucose, Bld: 160 mg/dL — ABNORMAL HIGH (ref 70–99)
Potassium: 2.8 mmol/L — ABNORMAL LOW (ref 3.5–5.1)
Potassium: 4.4 mmol/L (ref 3.5–5.1)
Sodium: 143 mmol/L (ref 135–145)
Sodium: 143 mmol/L (ref 135–145)

## 2023-11-18 LAB — GLUCOSE, CAPILLARY
Glucose-Capillary: 115 mg/dL — ABNORMAL HIGH (ref 70–99)
Glucose-Capillary: 124 mg/dL — ABNORMAL HIGH (ref 70–99)
Glucose-Capillary: 91 mg/dL (ref 70–99)
Glucose-Capillary: 93 mg/dL (ref 70–99)

## 2023-11-18 LAB — CBC
HCT: 29.9 % — ABNORMAL LOW (ref 36.0–46.0)
Hemoglobin: 9.7 g/dL — ABNORMAL LOW (ref 12.0–15.0)
MCH: 29.6 pg (ref 26.0–34.0)
MCHC: 32.4 g/dL (ref 30.0–36.0)
MCV: 91.2 fL (ref 80.0–100.0)
Platelets: 173 10*3/uL (ref 150–400)
RBC: 3.28 MIL/uL — ABNORMAL LOW (ref 3.87–5.11)
RDW: 17.2 % — ABNORMAL HIGH (ref 11.5–15.5)
WBC: 15.1 10*3/uL — ABNORMAL HIGH (ref 4.0–10.5)
nRBC: 0.5 % — ABNORMAL HIGH (ref 0.0–0.2)

## 2023-11-18 LAB — HEPATIC FUNCTION PANEL
ALT: 29 U/L (ref 0–44)
AST: 32 U/L (ref 15–41)
Albumin: 2.6 g/dL — ABNORMAL LOW (ref 3.5–5.0)
Alkaline Phosphatase: 80 U/L (ref 38–126)
Bilirubin, Direct: <0.1 mg/dL (ref 0.0–0.2)
Total Bilirubin: 0.6 mg/dL (ref 0.0–1.2)
Total Protein: 5.5 g/dL — ABNORMAL LOW (ref 6.5–8.1)

## 2023-11-18 LAB — PHOSPHORUS: Phosphorus: 2.9 mg/dL (ref 2.5–4.6)

## 2023-11-18 LAB — HEPARIN LEVEL (UNFRACTIONATED): Heparin Unfractionated: 0.1 [IU]/mL — ABNORMAL LOW (ref 0.30–0.70)

## 2023-11-18 LAB — MAGNESIUM: Magnesium: 2.1 mg/dL (ref 1.7–2.4)

## 2023-11-18 MED ORDER — TRACE MINERALS CU-MN-SE-ZN 300-55-60-3000 MCG/ML IV SOLN
INTRAVENOUS | Status: AC
  Filled 2023-11-18: qty 572

## 2023-11-18 MED ORDER — GABAPENTIN 100 MG PO CAPS
100.0000 mg | ORAL_CAPSULE | Freq: Two times a day (BID) | ORAL | Status: DC
  Administered 2023-11-18: 100 mg via ORAL
  Filled 2023-11-18: qty 1

## 2023-11-18 MED ORDER — ORAL CARE MOUTH RINSE: 15.0000 mL | OROMUCOSAL | Status: DC | PRN

## 2023-11-18 MED ORDER — GABAPENTIN 100 MG PO CAPS: 100.0000 mg | ORAL_CAPSULE | Freq: Three times a day (TID) | ORAL | Status: DC

## 2023-11-18 MED ORDER — HYDROCODONE-ACETAMINOPHEN 5-325 MG PO TABS
1.0000 | ORAL_TABLET | Freq: Two times a day (BID) | ORAL | Status: DC | PRN
  Administered 2023-11-18 (×2): 1 via ORAL
  Filled 2023-11-18 (×2): qty 1

## 2023-11-18 MED ORDER — GABAPENTIN 100 MG PO CAPS: 100.0000 mg | ORAL_CAPSULE | Freq: Two times a day (BID) | ORAL | Status: DC

## 2023-11-18 MED ORDER — SODIUM CHLORIDE 0.9 % IV SOLN: 4.0000 g | Freq: Once | INTRAVENOUS | Status: DC

## 2023-11-18 MED ORDER — OLANZAPINE 10 MG IM SOLR
5.0000 mg | Freq: Once | INTRAMUSCULAR | Status: DC
  Filled 2023-11-18: qty 10

## 2023-11-18 MED ORDER — CALCIUM GLUCONATE-NACL 2-0.675 GM/100ML-% IV SOLN
2.0000 g | INTRAVENOUS | Status: AC
  Administered 2023-11-18 (×2): 2000 mg via INTRAVENOUS
  Filled 2023-11-18 (×2): qty 100

## 2023-11-18 MED ORDER — HEPARIN (PORCINE) 25000 UT/250ML-% IV SOLN
1200.0000 [IU]/h | INTRAVENOUS | Status: DC
  Administered 2023-11-18: 500 [IU]/h via INTRAVENOUS
  Administered 2023-11-19: 850 [IU]/h via INTRAVENOUS
  Administered 2023-11-20: 1000 [IU]/h via INTRAVENOUS
  Administered 2023-11-21 – 2023-11-22 (×2): 1150 [IU]/h via INTRAVENOUS
  Administered 2023-11-23: 1200 [IU]/h via INTRAVENOUS
  Filled 2023-11-18 (×5): qty 250

## 2023-11-18 MED ORDER — ACETAMINOPHEN 325 MG PO TABS: 650.0000 mg | ORAL_TABLET | Freq: Four times a day (QID) | ORAL | Status: DC

## 2023-11-18 MED ORDER — ACETAMINOPHEN 500 MG PO TABS
1000.0000 mg | ORAL_TABLET | Freq: Four times a day (QID) | ORAL | Status: DC
  Administered 2023-11-18: 1000 mg via ORAL
  Filled 2023-11-18: qty 2

## 2023-11-18 MED ORDER — MONTELUKAST SODIUM 10 MG PO TABS
10.0000 mg | ORAL_TABLET | Freq: Every day | ORAL | Status: DC
  Administered 2023-11-18: 10 mg via ORAL
  Filled 2023-11-18: qty 1

## 2023-11-18 MED ORDER — STERILE WATER FOR INJECTION IJ SOLN
INTRAMUSCULAR | Status: AC
  Filled 2023-11-18: qty 10

## 2023-11-18 MED ORDER — TRAMADOL HCL 50 MG PO TABS: 50.0000 mg | ORAL_TABLET | Freq: Four times a day (QID) | ORAL | Status: DC | PRN

## 2023-11-18 MED ORDER — LORAZEPAM 2 MG/ML IJ SOLN
0.5000 mg | INTRAMUSCULAR | Status: DC | PRN
  Administered 2023-11-18 (×2): 0.5 mg via INTRAVENOUS
  Filled 2023-11-18 (×2): qty 1

## 2023-11-18 MED ORDER — GABAPENTIN 300 MG PO CAPS
300.0000 mg | ORAL_CAPSULE | Freq: Every day | ORAL | Status: DC
  Administered 2023-11-18: 300 mg via ORAL
  Filled 2023-11-18: qty 1

## 2023-11-18 MED ORDER — METHOCARBAMOL 500 MG PO TABS
500.0000 mg | ORAL_TABLET | Freq: Three times a day (TID) | ORAL | Status: DC
  Administered 2023-11-18 (×3): 500 mg via ORAL
  Filled 2023-11-18 (×3): qty 1

## 2023-11-18 MED ORDER — BUSPIRONE HCL 5 MG PO TABS
5.0000 mg | ORAL_TABLET | Freq: Two times a day (BID) | ORAL | Status: DC
  Administered 2023-11-18: 5 mg via ORAL
  Filled 2023-11-18: qty 1

## 2023-11-18 MED ORDER — GABAPENTIN 100 MG PO CAPS
100.0000 mg | ORAL_CAPSULE | Freq: Every day | ORAL | Status: DC
Start: 2023-11-18 — End: 2023-11-18

## 2023-11-18 MED ORDER — POTASSIUM CHLORIDE 10 MEQ/50ML IV SOLN
10.0000 meq | INTRAVENOUS | Status: AC
  Administered 2023-11-18 (×6): 10 meq via INTRAVENOUS
  Filled 2023-11-18: qty 50

## 2023-11-18 NOTE — Progress Notes (Signed)
 PHARMACY - TOTAL PARENTERAL NUTRITION CONSULT NOTE   Indication:  Ischemic bowel s/p small bowel resection, end ileostomy  Patient Measurements: Height: 5\' 7"  (170.2 cm) Weight: 49.6 kg (109 lb 5.6 oz) IBW/kg (Calculated) : 61.6 TPN AdjBW (KG): 54.4 Body mass index is 17.13 kg/m. Usual Weight: 58 kg   Assessment:  78 year old woman with hx lung adenocarcinoma, prior splenectomy among others presented to the emergency room with complaints of abdominal pain with N/V for the past week. She states the abdominal pain got worsened after dinner. She reported experiencing nausea and vomiting this morning. CT scan showed high grade small bowel obstruction with transition point in the right lower quadrant along the distal ileum. Some trace of ascites and mesenteric stranding. Concern for bowel wall ischemia. Broad spectrum antibiotics (zosyn) was started, blood cultures pending. Remains on pressor support and enteral nutrition now on hold. Pharmacy consulted to initiate TPN.   3/10 BMP glucose and finger sticks are significantly different. Patient does not have hx of diabetes so would not expect BG to be in the 400s on TPN. Repeat labs 3/10 after holding TPN for 5 min and flushing well shows improvement in BG and now matching POC glucose. Labs with high glucose were likely contaminated with TPN.  Glucose / Insulin: A1c 5.8%, CBGs <180, used 5 units SSI/24 hr Electrolytes: K 2.8 (60 IV ordered), Cl 97, CO2 33, CoCa 10.6 up before received 2g IV for iCa 0.65 (3/10), others wnl Renal:  Scr 1.42 down (bsl ~1), BUN 59 Hepatic: alb 2.6, Alk phos/AST/ALT/Tbili wnl, TG 75 Intake / Output; MIVF: albumin 25g q6hr; furosemide IV 60mg  Q8hr > UOP 4.8 mL/kg/hr, NGT 150 ml, ostomy 425 mL; Net + 2.8L GI Imaging:  3/4 CT - high-grade SBO is with transition point in the RLQ along the distal ileum, cannot exclude bowel wall ischemia; distended esophagus 3/4 KUB: Dilated fluid-filled loops of bowel  GI Surgeries /  Procedures:  3/4 ex lap, small bowel resection, open abdomen with bowel in discontinuity  3/6 small bowel resection, end ileostomy, closure of abdomen  Central access: PICC 11/12/23 TPN start date: 11/14/2023  Nutritional Goals: Goal TPN rate is 55 mL/hr (provides 86 g of protein and 1653 kcals per day)  RD Assessment: Estimated Needs Total Energy Estimated Needs: 1650-1850 kcals Total Protein Estimated Needs: 85-105 g Total Fluid Estimated Needs: >/= 1.7 L  Current Nutrition:  NPO + TPN  Plan:  Continue TPN at goal 55 mL/hr at 1800 to meet 100% goal kcal and AA Electrolytes in TPN: Decrease Na 75 mEq/L, K 45 mEq/L (= 60 mEq, will not adjust K in TPN given diuresis plan may change at any time), decrease Ca 0 mEq/L, Mg 6 mEq/L, Phos 15 mmol/L. Increase Cl:Ac max Cl Add standard MVI and trace elements to TPN Continue Sensitive q6h SSI and adjust as needed  Monitor TPN labs on Mon/Thurs, daily as needed Watch diuresis plan   Thank you for allowing pharmacy to be a part of this patient's care.  Alphia Moh, PharmD, BCPS, BCCP Clinical Pharmacist  Please check AMION for all [facility] Pharmacy phone numbers After 10:00 PM, call Main Pharmacy [phone]

## 2023-11-18 NOTE — Evaluation (Signed)
 Physical Therapy Evaluation Patient Details Name: Jenny White MRN: [id number] DOB: 03-24-1946 Today's Date: 11/18/2023  History of Present Illness  78 yo female admitted 3/4 with N/V, SBO taken to OR same date for ex lap with ileostomy and small bowel resection. 3/7 extubated and reintubated. 3/10 extubated. PMhx: spinal stenosis s/p sx, COPD, lung CA, arthritis, HTN  Clinical Impression  Pt pleasant and reports abdominal pain but willing to move. Pt normally walks with RW, lives with son and daughter-in-law and can perform her own ADLs. Today pt with significant weakness, impaired balance and posture, decreased transfers and function who will benefit from acute therapy to maximize mobility, independence and safety. Patient will benefit from continued inpatient follow up therapy, <3 hours/day   HR 128 with activity 98% on 4L        If plan is discharge home, recommend the following: A lot of help with walking and/or transfers;A lot of help with bathing/dressing/bathroom;Assistance with cooking/housework;Assist for transportation   Can travel by private vehicle   No    Equipment Recommendations BSC/3in1  Recommendations for Other Services  OT consult    Functional Status Assessment Patient has had a recent decline in their functional status and demonstrates the ability to make significant improvements in function in a reasonable and predictable amount of time.     Precautions / Restrictions Precautions Precautions: Fall;Other (comment) Recall of Precautions/Restrictions: Impaired Precaution/Restrictions Comments: ostomy, watch sats      Mobility  Bed Mobility Overal bed mobility: Needs Assistance Bed Mobility: Supine to Sit     Supine to sit: HOB elevated, Used rails, Min assist     General bed mobility comments: min assist to pivot to EOB with mod cues for sequence and assist for lines    Transfers Overall transfer level: Needs assistance   Transfers: Sit to/from  Stand, Bed to chair/wheelchair/BSC Sit to Stand: Mod assist, +2 physical assistance Stand pivot transfers: Mod assist, +2 physical assistance         General transfer comment: mod +2 assist to rise to standing with left knee blocked and physical assist to extend trunk, max cues. Pt stood x 2 from bed with RW present and +2 assist to pivot to chair. pt maintaining crouched posture throughout    Ambulation/Gait               General Gait Details: not yet able  Stairs            Wheelchair Mobility     Tilt Bed    Modified Rankin (Stroke Patients Only)       Balance Overall balance assessment: Needs assistance Sitting-balance support: Feet supported Sitting balance-Leahy Scale: Poor Sitting balance - Comments: min assist for sitting EOB, left lean in chair   Standing balance support: Bilateral upper extremity supported, Reliant on assistive device for balance Standing balance-Leahy Scale: Poor Standing balance comment: +2 assist with RW                             Pertinent Vitals/Pain Pain Assessment Pain Assessment: 0-10 Pain Score: 5  Pain Location: abdomen Pain Descriptors / Indicators: Aching, Guarding, Sore Pain Intervention(s): Limited activity within patient's tolerance, Monitored during session, Premedicated before session, Repositioned    Home Living Family/patient expects to be discharged to:: Private residence Living Arrangements: Children;Other (Comment) Available Help at Discharge: Family;Available 24 hours/day Type of Home: House Home Access: Stairs to enter   [employer] of Steps:  1   Home Layout: One level Home Equipment: Agricultural consultant (2 wheels);Shower seat      Prior Function Prior Level of Function : Independent/Modified Independent             Mobility Comments: using RW for gait ADLs Comments: daughter-in-law does IADL and driving     Extremity/Trunk Assessment   Upper Extremity  Assessment Upper Extremity Assessment: Generalized weakness    Lower Extremity Assessment Lower Extremity Assessment: Generalized weakness (3/5 with pt unable to resist over pressure)    Cervical / Trunk Assessment Cervical / Trunk Assessment: Kyphotic  Communication   Communication Communication: No apparent difficulties    Cognition Arousal: Alert Behavior During Therapy: Flat affect   PT - Cognitive impairments: No family/caregiver present to determine baseline, Problem solving, Safety/Judgement                       PT - Cognition Comments: slow processing and decreased awareness of deficits maintaining crouched posture Following commands: Intact       Cueing Cueing Techniques: Verbal cues, Gestural cues, Tactile cues     General Comments      Exercises General Exercises - Lower Extremity Long Arc Quad: AROM, Both, 10 reps, Seated, Strengthening Hip Flexion/Marching: AROM, Both, 10 reps, Seated, Strengthening   Assessment/Plan    PT Assessment Patient needs continued PT services  PT Problem List Decreased strength;Decreased coordination;Decreased activity tolerance;Decreased balance;Decreased mobility;Decreased safety awareness;Decreased knowledge of use of DME       PT Treatment Interventions Gait training;DME instruction;Balance training;Neuromuscular re-education;Functional mobility training;Patient/family education;Therapeutic activities;Therapeutic exercise;Cognitive remediation    PT Goals (Current goals can be found in the Care Plan section)  Acute Rehab PT Goals Patient Stated Goal: return home PT Goal Formulation: With patient Time For Goal Achievement: 12/02/23 Potential to Achieve Goals: Fair    Frequency Min 2X/week     Co-evaluation               AM-PAC PT "6 Clicks" Mobility  Outcome Measure Help needed turning from your back to your side while in a flat bed without using bedrails?: A Little Help needed moving from lying on  your back to sitting on the side of a flat bed without using bedrails?: A Little Help needed moving to and from a bed to a chair (including a wheelchair)?: A Lot Help needed standing up from a chair using your arms (e.g., wheelchair or bedside chair)?: Total Help needed to walk in hospital room?: Total Help needed climbing 3-5 steps with a railing? : Total 6 Click Score: 11    End of Session   Activity Tolerance: Patient tolerated treatment well Patient left: in chair;with call bell/phone within reach;with nursing/sitter in room Nurse Communication: Mobility status;Precautions PT Visit Diagnosis: Other abnormalities of gait and mobility (R26.89);Difficulty in walking, not elsewhere classified (R26.2);Muscle weakness (generalized) (M62.81)    Time: 4098-1191 PT Time Calculation (min) (ACUTE ONLY): 17 min   Charges:   PT Evaluation $PT Eval Moderate Complexity: 1 Mod   PT General Charges $$ ACUTE PT VISIT: 1 Visit         Merryl Hacker, PT Acute Rehabilitation Services Office: [phone]   Enedina Finner Aprile Dickenson 11/18/2023, 11:50 AM

## 2023-11-18 NOTE — Progress Notes (Addendum)
 Progress Note  5 Days Post-Op  Subjective: Off Bipap on [place].  Diuresing well.  Minimal NGT output.  Low ileostomy output, 75cc overnight.  Maybe total of 425cc yesterday it appears.  Objective: Vital signs in last 24 hours: Temp:  [96.9 F (36.1 C)-98.2 F (36.8 C)] 96.9 F (36.1 C) (03/11 0315) Pulse Rate:  [73-107] 106 (03/11 0700) Resp:  [18-39] 23 (03/11 0700) BP: (88-169)/(35-94) 135/56 (03/11 0700) SpO2:  [91 %-100 %] 91 % (03/11 0700) FiO2 (%):  [30 %-40 %] 30 % (03/10 2333) Weight:  [49.6 kg] 49.6 kg (03/11 0217) Last BM Date : 11/17/23  Intake/Output from previous day: 03/10 0701 - 03/11 0700 In: 2176.9 [I.V.:1896.7; NG/GT:30; IV Piggyback:250.2] Out: 6325 [Urine:5750; Emesis/NG output:150; Stool:425] Intake/Output this shift: No intake/output data recorded.  PE: General: NAD Heart: irregular Lungs: [place], respiratory effort nonlabored Abd: soft, ileostomy viable with small amount of liquid effluent, NGT with only 50cc of bilious output, midline incision c/d/I with staples, no cellulitis     Lab Results:  Recent Labs    11/17/23 0424 11/17/23 1333 11/18/23 0311  WBC 12.9*  --  15.1*  HGB 8.7* 7.8* 9.7*  HCT 26.9* 23.0* 29.9*  PLT 92*  --  173   BMET Recent Labs    11/17/23 1604 11/18/23 0311  NA 134* 143  K 4.2 2.8*  CL 91* 97*  CO2 29 33*  GLUCOSE 590* 133*  BUN 53* 59*  CREATININE 1.44* 1.42*  CALCIUM 8.7* 9.1   PT/INR No results for input(s): "LABPROT", "INR" in the last 72 hours. CMP     Component Value Date/Time   NA 143 11/18/2023 0311   K 2.8 (L) 11/18/2023 0311   CL 97 (L) 11/18/2023 0311   CO2 33 (H) 11/18/2023 0311   GLUCOSE 133 (H) 11/18/2023 0311   BUN 59 (H) 11/18/2023 0311   CREATININE 1.42 (H) 11/18/2023 0311   CREATININE 0.68 10/15/2023 1137   CALCIUM 9.1 11/18/2023 0311   PROT 5.5 (L) 11/18/2023 0311   ALBUMIN 2.6 (L) 11/18/2023 0311   AST 32 11/18/2023 0311   AST 20 10/15/2023 1137   ALT 29 11/18/2023 0311   ALT  15 10/15/2023 1137   ALKPHOS 80 11/18/2023 0311   BILITOT 0.6 11/18/2023 0311   BILITOT 0.4 10/15/2023 1137   GFRNONAA 38 (L) 11/18/2023 0311   GFRNONAA >60 10/15/2023 1137   Lipase     Component Value Date/Time   LIPASE 44 11/11/2023 1127       Studies/Results: No results found.   Anti-infectives: Anti-infectives (From admission, onward)    Start     Dose/Rate Route Frequency Ordered Stop   11/17/23 1000  meropenem (MERREM) 1 g in sodium chloride 0.9 % 100 mL IVPB        1 g 200 mL/hr over 30 Minutes Intravenous Every 12 hours 11/17/23 0832     11/15/23 1400  piperacillin-tazobactam (ZOSYN) IVPB 3.375 g  Status:  Discontinued        3.375 g 100 mL/hr over 30 Minutes Intravenous Every 8 hours 11/15/23 1223 11/17/23 0832   11/11/23 2000  piperacillin-tazobactam (ZOSYN) IVPB 3.375 g  Status:  Discontinued        3.375 g 12.5 mL/hr over 240 Minutes Intravenous Every 8 hours 11/11/23 1233 11/15/23 1223   11/11/23 1245  piperacillin-tazobactam (ZOSYN) IVPB 3.375 g        3.375 g 100 mL/hr over 30 Minutes Intravenous  Once 11/11/23 1233 11/11/23 1316  Assessment/Plan POD 6/4 S/P ex-lap small bowel resection, left open; ex-lap, small bowel resection, ileostomy and closure, Dr. Dwain Sarna for SBO - ileostomy viable and putting out liquid  - NGT with low volume, thin bilious output - clamp today and see how she does - wicks removed from midline wound 3/10 - continue TPN for now  - continue abx for now, changed to Merrem yesterday due to trach aspirate growth.  WBC up to 15K but currently believe this is more from pulmonary than abdominal.  D/w CCM. -may DC foley per primary when diuresis completed -pulm toilet, IS -PT/OT to mobilize the patient. -K is actively being replaced by pharm in TNA, recheck at noon  FEN: NPO, TPN, NGT clamped today VTE: heparin gtt to start today ID: Merrem  - per CCM -  Septic shock - improved Advanced COPD - on BiPAP RLL Lung  CA Reactive A. Fib - ok to start on heparin today., d/w primary service HTN GERD  LOS: 7 days     Letha Cape, [facility] 11/18/2023, 7:47 AM Please see Amion for pager number during day hours 7:00am-4:30pm

## 2023-11-18 NOTE — Progress Notes (Signed)
 NAME:  Jenny White, MRN:  [id number], DOB:  July 20, 1946, LOS: 7 ADMISSION DATE:  11/11/2023 CONSULTATION DATE:  11/11/2023 REFERRING MD:  Dwain Sarna - CCS CHIEF COMPLAINT: Shock  History of Present Illness:  Jenny White 78 year old woman with a pmhx significant for COPD, GERD, nephrolithiasis , HTN, OSA, Lung cancer ( Adenocarcinoma, diagnosed 05/2023) who presented to the emergency room with complaints of abdominal pain with N/V for the past week. She states the abdominal  pain got worsened after dinner. She reported experiencing nausea and  vomiting this morning.   In the ED  her labs were significant for a lactate 4.1 After fluid resuscitation, Lactate decreased to 2.5. CT scan showed high grade small bowel obstruction with transition point in the right lower quadrant along the distal ileum. Some trace of ascites and mesenteric stranding.Concern for bowel wall ischemia. Broad spectrum antibiotics ( zosyn) was started, blood cultures pending.   Patient was taken to the ER emergently with CCS 3/4 for Ex-lap / LOA. Intraoperative course  was notable for free fluid on entry, with closed loop bowel obstruction noted secondary to tight band adhesion. Patient left in discontinuity/ anastomosis not completed with open abdomen and plan for second look in 24-48 hrs. Patient remained intubated and was transferred to ICU in critical condition.   Pertinent Medical History:   Past Medical History:  Diagnosis Date   Arthritis    in back   COPD (chronic obstructive pulmonary disease) (HCC)    Difficult airway    Due to limited oral opening. Elective glidescope used previously.   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 Elevated LA found to have SBO taken emergently to surgery for EX-LAP.  3/7 extubated reintubated 3/10 extubated again  Interim History / Subjective:  Looks better! Off bipap since 3am Wants water  Objective:  Blood pressure (!) 135/56, pulse (!) 106, temperature (!) 96.9 F (36.1 C), temperature source Axillary, resp. rate (!) 23, height 5\' 7"  (1.702 m), weight 49.6 kg, SpO2 91%.    Vent Mode: PCV;BIPAP FiO2 (%):  [30 %-40 %] 30 % Set Rate:  [18 bmp] 18 bmp PEEP:  [5 cmH20] 5 cmH20 Pressure Support:  [10 cmH20] 10 cmH20   Intake/Output Summary (Last 24 hours) at 11/18/2023 0744 Last data filed at 11/18/2023 0522 Gross per 24 hour  Intake 2176.93 ml  Output 6325 ml  Net -4148.07 ml   Filed Weights   11/16/23 0500 11/17/23 0600 11/18/23 0217  Weight: 52.7 kg 52.7 kg 49.6 kg   Physical Examination: Awake answering questions MM dry NGT in place Ostomy minimal output Moves to command, profoundly weak Lungs severely diminished, stable Tachypneic and +accessory muscle use but better than yesterday Back in Afib  Assessment & Plan:  High grade SBO w/ bowel ischemia w/ peritonitis- ex lap, SB resection, abthera 3/4, further resection, end ileostomy, and abd wall closure 3/6 Postop vasoplegia/ septic shock- improved Advanced COPD RLL Lung cancer- s/p SBRT with good response Need for sedation with mechanical ventilation Baseline chronic pain/anxiety- complicates  sedation wean Acute kidney injury in  context of peritonitis- stable/improved Pulmonary edema Recurrent Afib- despite amio  - Continue amio, duration TBD; start heparin gtt - Start per tube anxiety meds, okay'd by CCS - TPN - Hold diuresis, replete K, check CVP - f/u tracheal aspirate, watch fever/WBC curve, think WBC driven by lungs/aspiration - Triple neb therapy for now - Keep in ICU pending improved  respiratory status  Best Practice: (right click and "Reselect all SmartList Selections" daily)   Diet/type: TPN DVT prophylaxis: heparin gtt GI prophylaxis: PPI Lines: Arterial Line and PICC line Foley:  Yes, and it is still needed Code Status:  full code Last date of multidisciplinary goals of care discussion: 3/11  35 min cc time Myrla Halsted MD PCCM

## 2023-11-18 NOTE — Progress Notes (Signed)
 Nutrition Follow-up  DOCUMENTATION CODES:   Not applicable  INTERVENTION:   Continue TPN to meet nutritional needs -May need to increase volume if UOP remains high without diuresis   NUTRITION DIAGNOSIS:   Inadequate oral intake related to acute illness, altered GI function as evidenced by NPO status.  Being addressed via TPN  GOAL:   Patient will meet greater than or equal to 90% of their needs  Met via TPN  MONITOR:   Diet advancement, Skin, I & O's, Labs, Weight trends (TPN)  REASON FOR ASSESSMENT:   Ventilator    ASSESSMENT:   78 yo female admitted with mixed shock, septic and hypovolemic, high grade SBO with bowel ischemia. PMH includes COPD, GERD, nephrolithiasis, HTN, OSA, lung cancer (adenocarcinoma RLL dx 05/2023)  3/04 OR: Ex Lap, SB resection, Abthera wound VAC placement, open abdomen with bowel in discontinuity  3/05 PICC line placed 3/06 Return to OR: Re-opening of recent ex lap, SB resection with end ileostomy, abd wall closure and wound VAC remove 3/07 TPN initiated, Extubated, Re-Intubated 3/10 Extubated  Currently on [place], off BiPap since 3am. Improved today but using accessory muscles to breathe, +tachypnea  Pressor requirements have improved, Levophed at 1.5, off Vasopressin  Remains NPO, NG tube in place-minimal bilious output, clamping trial today TPN at 55 ml/hr Ileostomy 425 mL in 24 hours  UOP 5.75 L in 24 hours Weight down to 49.6 kg  Labs: noted several issues with lab draws being inaccurate due to contamination from TPN Sodium 143 (wdl) Potassium 2.8 (L)-supplemented outside TPN BUN 59, Creatinine 1.42 Phosphorus 2.9 (wdl) Magnesium 2.1 (wdl) Corrected calcium 10.2 (wdl) Albumin 2.6-noted calcium removed from TPN  Meds:  ss novolog KCl    Diet Order:   Diet Order             Diet NPO time specified Except for: Other (See Comments), Ice Chips  Diet effective now                   EDUCATION NEEDS:   Not  appropriate for education at this time  Skin:  Skin Assessment: Skin Integrity Issues: Skin Integrity Issues:: Incisions Wound Vac: removed Incisions: closed abd incision with new ileostomy creation  Last BM:  +output via end ileostomy  Height:   Ht Readings from Last 1 Encounters:  11/11/23 5\' 7"  (1.702 m)    Weight:   Wt Readings from Last 1 Encounters:  11/18/23 49.6 kg    BMI:  Body mass index is 17.13 kg/m.  Estimated Nutritional Needs:   Kcal:  1650-1850 kcals  Protein:  85-105 g  Fluid:  >/= 1.7 L   Romelle Starcher MS, RDN, LDN, CNSC Registered Dietitian 3 Clinical Nutrition RD Inpatient Contact Info in Amion

## 2023-11-18 NOTE — Plan of Care (Signed)
   Problem: Clinical Measurements: Goal: Respiratory complications will improve Outcome: Progressing Goal: Cardiovascular complication will be avoided Outcome: Progressing

## 2023-11-18 NOTE — Plan of Care (Signed)
?  Problem: Clinical Measurements: ?Goal: Diagnostic test results will improve ?Outcome: Progressing ?Goal: Respiratory complications will improve ?Outcome: Progressing ?  ?Problem: Activity: ?Goal: Risk for activity intolerance will decrease ?Outcome: Progressing ?  ?Problem: Coping: ?Goal: Level of anxiety will decrease ?Outcome: Progressing ?  ?

## 2023-11-18 NOTE — Progress Notes (Signed)
 PHARMACY - ANTICOAGULATION CONSULT NOTE  Pharmacy Consult for heparin Indication: atrial fibrillation  No Known Allergies  Patient Measurements: Height: 5\' 7"  (170.2 cm) Weight: 49.6 kg (109 lb 5.6 oz) IBW/kg (Calculated) : 61.6 Heparin Dosing Weight: 49.6 kg   Vital Signs: Temp: 96.9 F (36.1 C) (03/11 0315) Temp Source: Axillary (03/11 0315) BP: 135/56 (03/11 0700) Pulse Rate: 106 (03/11 0700)  Labs: Recent Labs    11/16/23 0419 11/16/23 1358 11/17/23 0424 11/17/23 0817 11/17/23 1333 11/17/23 1604 11/18/23 0311  HGB 8.0*  --  8.7*  --  7.8*  --  9.7*  HCT 24.2*  --  26.9*  --  23.0*  --  29.9*  PLT 68*  --  92*  --   --   --  173  CREATININE 1.51*   < > 1.49* 1.55*  --  1.44* 1.42*   < > = values in this interval not displayed.    Estimated Creatinine Clearance: 25.6 mL/min (A) (by C-G formula based on SCr of 1.42 mg/dL (H)).   Medical History: Past Medical History:  Diagnosis Date   Arthritis    in back   COPD (chronic obstructive pulmonary disease) (HCC)    Difficult airway    Due to limited oral opening. Elective glidescope used previously.   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   in [place]   Sleep apnea    no CPAP Can't tolerate    Medications:  Scheduled:   acetaminophen  1,000 mg Oral Q6H   arformoterol  15 mcg Nebulization BID   budesonide (PULMICORT) nebulizer solution  0.5 mg Nebulization BID   busPIRone  5 mg Per Tube BID   Chlorhexidine Gluconate Cloth  6 each Topical Daily   gabapentin  100 mg Oral BID   insulin aspart  0-9 Units Subcutaneous Q6H   methocarbamol  500 mg Oral TID   nicotine  21 mg Transdermal Daily   mouth rinse  15 mL Mouth Rinse Q2H   pantoprazole (PROTONIX) IV  40 mg Intravenous Daily   revefenacin  175 mcg Nebulization Daily    sodium chloride flush  10-40 mL Intracatheter Q12H    Assessment: 77 yof presenting with abdominal pain now s/p ex-lap small bowel resection followed by small bowel resection, ileostomy and closure. Has been going in/out of Afib (no AC PTA). Was on subQ heparin for DVT ppx (LD 3/11@0516 ).   Hgb up to 9.7, plt improved to 173. Discussed with CCM and surgery, will start heparin infusion at lower dose without bolus given recent surgery.   Goal of Therapy:  Heparin level 0.3-0.5 units/ml Monitor platelets by anticoagulation protocol: Yes   Plan:  No heparin bolus given recent surgery Start heparin infusion at 500 units/hr  Order heparin level in 8 hours Monitor daily HL, CBC, and for s/sx of bleeding   Thank you for allowing pharmacy to participate in this patient's care,  Sherron Monday, PharmD, BCCCP Clinical Pharmacist  Phone: [phone] 11/18/2023 11:09 AM  Please check AMION for all [facility] Pharmacy phone numbers After 10:00 PM, call Main Pharmacy [phone]

## 2023-11-18 NOTE — Progress Notes (Signed)
 PHARMACY - ANTICOAGULATION CONSULT NOTE  Pharmacy Consult for heparin Indication: atrial fibrillation  No Known Allergies  Patient Measurements: Height: 5\' 7"  (170.2 cm) Weight: 49.6 kg (109 lb 5.6 oz) IBW/kg (Calculated) : 61.6 Heparin Dosing Weight: 49.6 kg   Vital Signs: BP: 112/61 (03/11 1815) Pulse Rate: 97 (03/11 1815)  Labs: Recent Labs    11/16/23 0419 11/16/23 1358 11/17/23 0424 11/17/23 0817 11/17/23 1333 11/17/23 1604 11/18/23 0311 11/18/23 1130 11/18/23 1713  HGB 8.0*  --  8.7*  --  7.8*  --  9.7*  --   --   HCT 24.2*  --  26.9*  --  23.0*  --  29.9*  --   --   PLT 68*  --  92*  --   --   --  173  --   --   HEPARINUNFRC  --   --   --   --   --   --   --   --  0.10*  CREATININE 1.51*   < > 1.49*   < >  --  1.44* 1.42* 1.50*  --    < > = values in this interval not displayed.    Estimated Creatinine Clearance: 24.2 mL/min (A) (by C-G formula based on SCr of 1.5 mg/dL (H)).   Assessment: 71 yof presenting with abdominal pain now s/p ex-lap small bowel resection followed by small bowel resection, ileostomy and closure. Has been going in/out of Afib (no AC PTA). Was on subQ heparin for DVT ppx (LD 3/11@0516 ).   Hgb up to 9.7, plt improved to 173. Discussed with CCM and surgery 3/11 and will start heparin infusion at lower dose without bolus given recent surgery.   Heparin level 0.1 (subtherapeutic) on infusion at 500 units/hr. No bleeding noted.  Goal of Therapy:  Heparin level 0.3-0.5 units/ml Monitor platelets by anticoagulation protocol: Yes   Plan:  No heparin bolus given recent surgery Increase heparin infusion to 600 units/hr  Heparin level in 8 hours  Christoper Fabian, PharmD, BCPS Please see amion for complete clinical pharmacist phone list 11/18/2023 6:33 PM

## 2023-11-19 ENCOUNTER — Inpatient Hospital Stay (HOSPITAL_COMMUNITY)

## 2023-11-19 DIAGNOSIS — R579 Shock, unspecified: Secondary | ICD-10-CM | POA: Diagnosis not present

## 2023-11-19 DIAGNOSIS — G9340 Encephalopathy, unspecified: Secondary | ICD-10-CM

## 2023-11-19 DIAGNOSIS — Z9889 Other specified postprocedural states: Secondary | ICD-10-CM | POA: Diagnosis not present

## 2023-11-19 DIAGNOSIS — A419 Sepsis, unspecified organism: Secondary | ICD-10-CM | POA: Insufficient documentation

## 2023-11-19 DIAGNOSIS — J690 Pneumonitis due to inhalation of food and vomit: Secondary | ICD-10-CM | POA: Diagnosis not present

## 2023-11-19 DIAGNOSIS — E876 Hypokalemia: Secondary | ICD-10-CM

## 2023-11-19 DIAGNOSIS — E46 Unspecified protein-calorie malnutrition: Secondary | ICD-10-CM | POA: Insufficient documentation

## 2023-11-19 DIAGNOSIS — K565 Intestinal adhesions [bands], unspecified as to partial versus complete obstruction: Secondary | ICD-10-CM | POA: Insufficient documentation

## 2023-11-19 DIAGNOSIS — I480 Paroxysmal atrial fibrillation: Secondary | ICD-10-CM | POA: Insufficient documentation

## 2023-11-19 DIAGNOSIS — J9621 Acute and chronic respiratory failure with hypoxia: Secondary | ICD-10-CM

## 2023-11-19 DIAGNOSIS — K56609 Unspecified intestinal obstruction, unspecified as to partial versus complete obstruction: Secondary | ICD-10-CM | POA: Diagnosis not present

## 2023-11-19 DIAGNOSIS — N179 Acute kidney failure, unspecified: Secondary | ICD-10-CM | POA: Insufficient documentation

## 2023-11-19 LAB — CBC
HCT: 32.5 % — ABNORMAL LOW (ref 36.0–46.0)
Hemoglobin: 10.5 g/dL — ABNORMAL LOW (ref 12.0–15.0)
MCH: 29.7 pg (ref 26.0–34.0)
MCHC: 32.3 g/dL (ref 30.0–36.0)
MCV: 91.8 fL (ref 80.0–100.0)
Platelets: 285 10*3/uL (ref 150–400)
RBC: 3.54 MIL/uL — ABNORMAL LOW (ref 3.87–5.11)
RDW: 17.5 % — ABNORMAL HIGH (ref 11.5–15.5)
WBC: 15.3 10*3/uL — ABNORMAL HIGH (ref 4.0–10.5)
nRBC: 0.6 % — ABNORMAL HIGH (ref 0.0–0.2)

## 2023-11-19 LAB — BLOOD GAS, ARTERIAL
Acid-Base Excess: 9.2 mmol/L — ABNORMAL HIGH (ref 0.0–2.0)
Acid-Base Excess: 10.1 mmol/L — ABNORMAL HIGH (ref 0.0–2.0)
Bicarbonate: 37.8 mmol/L — ABNORMAL HIGH (ref 20.0–28.0)
Bicarbonate: 37.5 mmol/L — ABNORMAL HIGH (ref 20.0–28.0)
Drawn by: 33176
Drawn by: 33176
O2 Saturation: 94.8 %
O2 Saturation: 99.2 %
Patient temperature: 37.1
Patient temperature: 37.1
pCO2 arterial: 70 mmHg (ref 32–48)
pCO2 arterial: 62 mmHg — ABNORMAL HIGH (ref 32–48)
pH, Arterial: 7.34 — ABNORMAL LOW (ref 7.35–7.45)
pH, Arterial: 7.39 (ref 7.35–7.45)
pO2, Arterial: 71 mmHg — ABNORMAL LOW (ref 83–108)
pO2, Arterial: 114 mmHg — ABNORMAL HIGH (ref 83–108)

## 2023-11-19 LAB — CULTURE, RESPIRATORY W GRAM STAIN

## 2023-11-19 LAB — HEPATIC FUNCTION PANEL
ALT: 29 U/L (ref 0–44)
AST: 29 U/L (ref 15–41)
Albumin: 2.5 g/dL — ABNORMAL LOW (ref 3.5–5.0)
Alkaline Phosphatase: 87 U/L (ref 38–126)
Bilirubin, Direct: 0.1 mg/dL (ref 0.0–0.2)
Indirect Bilirubin: 0.5 mg/dL (ref 0.3–0.9)
Total Bilirubin: 0.6 mg/dL (ref 0.0–1.2)
Total Protein: 5.5 g/dL — ABNORMAL LOW (ref 6.5–8.1)

## 2023-11-19 LAB — MAGNESIUM: Magnesium: 1.9 mg/dL (ref 1.7–2.4)

## 2023-11-19 LAB — PHOSPHORUS: Phosphorus: 3.5 mg/dL (ref 2.5–4.6)

## 2023-11-19 LAB — GLUCOSE, CAPILLARY
Glucose-Capillary: 124 mg/dL — ABNORMAL HIGH (ref 70–99)
Glucose-Capillary: 138 mg/dL — ABNORMAL HIGH (ref 70–99)
Glucose-Capillary: 107 mg/dL — ABNORMAL HIGH (ref 70–99)

## 2023-11-19 LAB — HEPARIN LEVEL (UNFRACTIONATED)
Heparin Unfractionated: <0.1 [IU]/mL — ABNORMAL LOW (ref 0.30–0.70)
Heparin Unfractionated: <0.1 [IU]/mL — ABNORMAL LOW (ref 0.30–0.70)

## 2023-11-19 LAB — BASIC METABOLIC PANEL
Anion gap: 11 (ref 5–15)
BUN: 60 mg/dL — ABNORMAL HIGH (ref 8–23)
CO2: 33 mmol/L — ABNORMAL HIGH (ref 22–32)
Calcium: 9.7 mg/dL (ref 8.9–10.3)
Chloride: 99 mmol/L (ref 98–111)
Creatinine, Ser: 1.25 mg/dL — ABNORMAL HIGH (ref 0.44–1.00)
GFR, Estimated: 44 mL/min — ABNORMAL LOW (ref >60)
Glucose, Bld: 121 mg/dL — ABNORMAL HIGH (ref 70–99)
Potassium: 3 mmol/L — ABNORMAL LOW (ref 3.5–5.1)
Sodium: 143 mmol/L (ref 135–145)

## 2023-11-19 MED ORDER — SODIUM CHLORIDE 0.9 % IV SOLN: INTRAVENOUS | Status: AC

## 2023-11-19 MED ORDER — POTASSIUM CHLORIDE 10 MEQ/50ML IV SOLN
10.0000 meq | INTRAVENOUS | Status: AC
  Administered 2023-11-19 (×6): 10 meq via INTRAVENOUS
  Filled 2023-11-19 (×6): qty 50

## 2023-11-19 MED ORDER — MONTELUKAST SODIUM 10 MG PO TABS
10.0000 mg | ORAL_TABLET | Freq: Every day | ORAL | Status: DC
  Administered 2023-11-22 – 2023-12-10 (×19): 10 mg
  Filled 2023-11-19 (×20): qty 1

## 2023-11-19 MED ORDER — GABAPENTIN 250 MG/5ML PO SOLN
300.0000 mg | Freq: Every day | ORAL | Status: DC
Start: 2023-11-19 — End: 2023-11-23
  Administered 2023-11-22: 300 mg
  Filled 2023-11-19 (×4): qty 6

## 2023-11-19 MED ORDER — FREE WATER
200.0000 mL | Freq: Two times a day (BID) | Status: DC
Start: 2023-11-19 — End: 2023-11-19

## 2023-11-19 MED ORDER — MAGNESIUM FOR TPN
INJECTION | INTRAVENOUS | Status: AC
  Filled 2023-11-19: qty 572

## 2023-11-19 NOTE — Progress Notes (Signed)
 PHARMACY - ANTICOAGULATION CONSULT NOTE  Pharmacy Consult for heparin Indication: atrial fibrillation  No Known Allergies  Patient Measurements: Height: 5\' 7"  (170.2 cm) Weight: 49.6 kg (109 lb 5.6 oz) IBW/kg (Calculated) : 61.6 Heparin Dosing Weight: 49.6 kg   Vital Signs: Temp: 98 F (36.7 C) (03/12 0313) Temp Source: Axillary (03/12 0313) BP: 121/53 (03/12 0313) Pulse Rate: 90 (03/12 0313)  Labs: Recent Labs    11/17/23 0424 11/17/23 0817 11/17/23 1333 11/17/23 1604 11/18/23 0311 11/18/23 1130 11/18/23 1713 11/19/23 0458  HGB 8.7*  --  7.8*  --  9.7*  --   --  10.5*  HCT 26.9*  --  23.0*  --  29.9*  --   --  32.5*  PLT 92*  --   --   --  173  --   --  285  HEPARINUNFRC  --   --   --   --   --   --  0.10* <0.10*  CREATININE 1.49*   < >  --  1.44* 1.42* 1.50*  --   --    < > = values in this interval not displayed.    Estimated Creatinine Clearance: 24.2 mL/min (A) (by C-G formula based on SCr of 1.5 mg/dL (H)).   Medical History: Past Medical History:  Diagnosis Date   Arthritis    in back   COPD (chronic obstructive pulmonary disease) (HCC)    Difficult airway    Due to limited oral opening. Elective glidescope used previously.   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   in [place]   Sleep apnea    no CPAP Can't tolerate    Medications:  Scheduled:   arformoterol  15 mcg Nebulization BID   budesonide (PULMICORT) nebulizer solution  0.5 mg Nebulization BID   busPIRone  5 mg Oral BID   Chlorhexidine Gluconate Cloth  6 each Topical Daily   gabapentin  100 mg Oral BID WC   gabapentin  300 mg Oral QHS   insulin aspart  0-9 Units Subcutaneous Q6H   methocarbamol  500 mg Oral TID   montelukast  10 mg Oral QHS   nicotine  21 mg Transdermal Daily   OLANZapine  5 mg  Intravenous Once   pantoprazole (PROTONIX) IV  40 mg Intravenous Daily   revefenacin  175 mcg Nebulization Daily   sodium chloride flush  10-40 mL Intracatheter Q12H   sterile water (preservative free)        Assessment: 89 yof presenting with abdominal pain now s/p ex-lap small bowel resection followed by small bowel resection, ileostomy and closure. Has been going in/out of Afib (no AC PTA). Was on subQ heparin for DVT ppx (LD 3/11@0516 ).   Hgb up to 9.7, plt improved to 173. Discussed with CCM and surgery, will start heparin infusion at lower dose without bolus given recent surgery.   3/12 AM update:  Heparin level sub-therapeutic   Goal of Therapy:  Heparin level 0.3-0.5 units/ml Monitor platelets by anticoagulation protocol: Yes   Plan:  Inc heparin to 750 units/hr Heparin level in 8 hours  Abran Duke, PharmD, BCPS Clinical Pharmacist Phone: [phone]

## 2023-11-19 NOTE — Progress Notes (Signed)
 Abg reviewed Will attempt BIPAP see if she perks up Already stopped loraz Plan BIPAP  Repeat abg If MS better can stay progressive w/ PRN bipap if not then needs to go back to ICU

## 2023-11-19 NOTE — Progress Notes (Signed)
 PHARMACY - ANTICOAGULATION CONSULT NOTE  Pharmacy Consult for heparin Indication: atrial fibrillation  No Known Allergies  Patient Measurements: Height: 5\' 7"  (170.2 cm) Weight: 49.6 kg (109 lb 5.6 oz) IBW/kg (Calculated) : 61.6 Heparin Dosing Weight: 49.6 kg   Vital Signs: Temp: 98.8 F (37.1 C) (03/12 1145) Temp Source: Oral (03/12 1145) BP: 90/46 (03/12 1510) Pulse Rate: 77 (03/12 1510)  Labs: Recent Labs    11/17/23 0424 11/17/23 0817 11/17/23 1333 11/17/23 1604 11/18/23 0311 11/18/23 1130 11/18/23 1713 11/19/23 0458 11/19/23 1400  HGB 8.7*  --  7.8*  --  9.7*  --   --  10.5*  --   HCT 26.9*  --  23.0*  --  29.9*  --   --  32.5*  --   PLT 92*  --   --   --  173  --   --  285  --   HEPARINUNFRC  --   --   --   --   --   --  0.10* <0.10* <0.10*  CREATININE 1.49*   < >  --    < > 1.42* 1.50*  --  1.25*  --    < > = values in this interval not displayed.    Estimated Creatinine Clearance: 29 mL/min (A) (by C-G formula based on SCr of 1.25 mg/dL (H)).   Medical History: Past Medical History:  Diagnosis Date   Arthritis    in back   COPD (chronic obstructive pulmonary disease) (HCC)    Difficult airway    Due to limited oral opening. Elective glidescope used previously.   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   in [place]   Sleep apnea    no CPAP Can't tolerate    Medications:  Scheduled:   arformoterol  15 mcg Nebulization BID   budesonide (PULMICORT) nebulizer solution  0.5 mg Nebulization BID   Chlorhexidine Gluconate Cloth  6 each Topical Daily   gabapentin  300 mg Oral QHS   montelukast  10 mg Oral QHS   nicotine  21 mg Transdermal Daily   pantoprazole (PROTONIX) IV  40 mg Intravenous Daily   revefenacin  175 mcg Nebulization Daily   sodium chloride flush  10-40 mL  Intracatheter Q12H    Assessment: 77 yof presenting with abdominal pain now s/p ex-lap small bowel resection followed by small bowel resection, ileostomy and closure. Has been going in/out of Afib (no AC PTA). Was on subQ heparin for DVT ppx (LD 3/11@0516 ).   HL <0.10 - subtherapeutic  Goal of Therapy:  Heparin level 0.3-0.5 units/ml Monitor platelets by anticoagulation protocol: Yes   Plan:  Increase heparin to 850 units/hr Heparin level in 8 hours Daily HL, CBC   Calton Dach, PharmD, BCCCP Clinical Pharmacist 11/19/2023 4:12 PM

## 2023-11-19 NOTE — Progress Notes (Signed)
 PHARMACY - TOTAL PARENTERAL NUTRITION CONSULT NOTE   Indication:  Ischemic bowel s/p small bowel resection, end ileostomy  Patient Measurements: Height: 5\' 7"  (170.2 cm) Weight: 49.6 kg (109 lb 5.6 oz) IBW/kg (Calculated) : 61.6 TPN AdjBW (KG): 54.4 Body mass index is 17.13 kg/m. Usual Weight: 58 kg   Assessment:  78 year old woman with hx lung adenocarcinoma, prior splenectomy among others presented to the emergency room with complaints of abdominal pain with N/V for the past week. She states the abdominal pain got worsened after dinner. She reported experiencing nausea and vomiting this morning. CT scan showed high grade small bowel obstruction with transition point in the right lower quadrant along the distal ileum. Some trace of ascites and mesenteric stranding. Concern for bowel wall ischemia. Broad spectrum antibiotics (zosyn) was started, blood cultures pending. Remains on pressor support and enteral nutrition now on hold. Pharmacy consulted to initiate TPN.   3/10 BMP glucose and finger sticks are significantly different. Patient does not have hx of diabetes so would not expect BG to be in the 400s on TPN. Repeat labs 3/10 after holding TPN for 5 min and flushing well shows improvement in BG and now matching POC glucose. Labs with high glucose were likely contaminated with TPN.  Glucose / Insulin: A1c 5.8%, CBGs <130, used 1 units SSI/24 hr Electrolytes: K 3 (received 60 IV, off furosemide), CO2 33, CoCa 10.9 (received 2g IV for iCa 0.65 (3/10), none in TPN), others wnl Renal:  Scr 1.25 down (bsl ~1), BUN 60 Hepatic: alb 2.5, Alk phos/AST/ALT/Tbili wnl, TG 75 Intake / Output; MIVF: furosemide x1;  UOP 2.4 mL/kg/hr, ostomy 650 mL; Net + 1L GI Imaging:  3/4 CT - high-grade SBO is with transition point in the RLQ along the distal ileum, cannot exclude bowel wall ischemia; distended esophagus 3/4 KUB: Dilated fluid-filled loops of bowel  GI Surgeries / Procedures:  3/4 ex lap, small  bowel resection, open abdomen with bowel in discontinuity  3/6 small bowel resection, end ileostomy, closure of abdomen  Central access: PICC 11/12/23 TPN start date: 11/14/2023  Nutritional Goals: Goal TPN rate is 55 mL/hr (provides 86 g of protein and 1653 kcals per day)  RD Assessment: Estimated Needs Total Energy Estimated Needs: 1650-1850 kcals Total Protein Estimated Needs: 85-105 g Total Fluid Estimated Needs: >/= 1.7 L  Current Nutrition:  NPO + TPN  Plan:  Continue TPN at goal 55 mL/hr at 1800 to meet 100% goal kcal and AA Electrolytes in TPN: Na 75 mEq/L, increase K 60 mEq/L, Ca 0 mEq/L, increase Mg 10 mEq/L, Phos 15 mmol/L. Cl:Ac max Cl Give Kcl 60 mEq IV Add standard MVI and trace elements to TPN Stop Sensitive q6h SSI  Monitor TPN labs on Mon/Thurs, daily as needed   Thank you for allowing pharmacy to be a part of this patient's care.  Alphia Moh, PharmD, BCPS, BCCP Clinical Pharmacist  Please check AMION for all [facility] Pharmacy phone numbers After 10:00 PM, call Main Pharmacy [phone]

## 2023-11-19 NOTE — Progress Notes (Signed)
 Afternoon rounds  Abg reviewed, gas exchange Improved, she's a little more awake.  Plan Ok to keep in PCU NIPPV this am and PRN Limit sedating meds

## 2023-11-19 NOTE — Plan of Care (Signed)
  Problem: Clinical Measurements: Goal: Ability to maintain clinical measurements within normal limits will improve Outcome: Progressing Goal: Will remain free from infection Outcome: Progressing Goal: Diagnostic test results will improve Outcome: Progressing Goal: Cardiovascular complication will be avoided Outcome: Progressing   Problem: Nutrition: Goal: Adequate nutrition will be maintained Outcome: Progressing   Problem: Coping: Goal: Level of anxiety will decrease Outcome: Progressing   Problem: Elimination: Goal: Will not experience complications related to bowel motility Outcome: Progressing Goal: Will not experience complications related to urinary retention Outcome: Progressing   Problem: Pain Managment: Goal: General experience of comfort will improve and/or be controlled Outcome: Progressing   Problem: Safety: Goal: Ability to remain free from injury will improve Outcome: Progressing   Problem: Skin Integrity: Goal: Risk for impaired skin integrity will decrease Outcome: Progressing   Problem: Activity: Goal: Ability to tolerate increased activity will improve Outcome: Progressing   Problem: Respiratory: Goal: Ability to maintain a clear airway and adequate ventilation will improve Outcome: Progressing   Problem: Role Relationship: Goal: Method of communication will improve Outcome: Progressing   Problem: Education: Goal: Individualized Educational Video(s) Outcome: Progressing   Problem: Coping: Goal: Ability to adjust to condition or change in health will improve Outcome: Progressing   Problem: Fluid Volume: Goal: Ability to maintain a balanced intake and output will improve Outcome: Progressing   Problem: Health Behavior/Discharge Planning: Goal: Ability to identify and utilize available resources and services will improve Outcome: Progressing Goal: Ability to manage health-related needs will improve Outcome: Progressing   Problem:  Metabolic: Goal: Ability to maintain appropriate glucose levels will improve Outcome: Progressing   Problem: Nutritional: Goal: Maintenance of adequate nutrition will improve Outcome: Progressing Goal: Progress toward achieving an optimal weight will improve Outcome: Progressing   Problem: Skin Integrity: Goal: Risk for impaired skin integrity will decrease Outcome: Progressing   Problem: Tissue Perfusion: Goal: Adequacy of tissue perfusion will improve Outcome: Progressing   Problem: Safety: Goal: Non-violent Restraint(s) Outcome: Progressing

## 2023-11-19 NOTE — Progress Notes (Signed)
 [facility] PROGRESS NOTE    Progress Note  Jenny White  ION:[id number] DOB: 02-26-1946 DOA: 11/11/2023 PCP: Danella Penton, MD     Brief Narrative:   Jenny White is an 78 y.o. 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  In the ED was found to have a lactate of 4 which improved with after fluid resuscitation, CT scan of the abdomen pelvis show high-grade small bowel obstruction with a transition point in the right lower quadrant General Surgery was consulted and was taken emergently to the OR on 11/11/2022 for exploratory laparotomy with lysis of adhesion left open had a small bowel resection with ileostomy NG tube in place with low volumes.  Taken to the OR again and abdominal closure on 11/13/2022  Significant Events: 3/4: Presented to ED with abdominal pain, N/V. Elevated LA found to have SBO taken emergently to surgery for EX-LAP.  3/7 extubated reintubated 3/10 extubated again    Assessment/Plan:   High-grade small bowel obstruction with bowel ischemia and peritonitis: Status post exploratory laparotomy on 11/11/2022 with bowel resection end ileostomy and abdominal wall collection on 11/13/2022. Currently on TPN. Antibiotics per general surgery currently on IV meropenem. NG tube has been clamped.  Acute respiratory failure with hypoxia likely due to pulmonary edema and/or aspiration pneumonia: Started on IV diuresis with good response.  Stop IV diuresis.  She has become tachycardic, she is hypokalemic. She was positive about 12 L this morning only positive about 2. Creatinine is slowly trending down.  Will hold diuresis for now. Continue inhalers for now. Currently on IV meropenem, for concerns related to aspiration, currently NPO. This morning she had an  episode of severe aspiration, will put her on  n.p.o.'s try to minimize oral meds. Will go ahead and place a core track for feeding.  Postop vasoplegia/septic shock: Off pressors on IV antibiotic IV meropenem.  Recurrent aspiration pneumonia: Currently on IV meropenem will discuss with PCCM General Surgery to see beget transition to IV Unasyn. Leukocytosis continues to be high at 15,000. Culture aspirate gram-negative rod  Recurrent atrial fibrillation: Currently on IV amiodarone and IV heparin. With a baseline creatinine of less than 1, peaked at 1.9.  Acute kidney injury: Likely due to sepsis, Started on pressors and IV fluids now creatinine 1.2 slowly trending down. Continue IV fluids for an additional 24 hours.  Severe protein caloric malnutrition/nutrition: Currently on TPN. She is NPO as she is at risk of aspiration.  hypokalemia: Repeat to IV recheck in the morning.  Diabetes mellitus type 2: Continue sliding scale insulin.  Anxiety: Currently getting BuSpar Neurontin per tube. She did receive 1 dose of Zyprexa IV.   DVT prophylaxis: Heparin Family Communication:none Status is: Inpatient Remains inpatient appropriate because: Stepdown pending respiratory status    Code Status:     Code Status Orders  (From admission, onward)           Start     Ordered   11/11/23 1627  Full code  Continuous       Question:  By:  Answer:  Consent: discussion documented in EHR   11/11/23 1632           Code Status History     Date Active Date Inactive Code Status Order ID Comments User Context   06/20/2022 1336 06/21/2022 1858  Full Code [id number]  Tressie Stalker, MD Inpatient   06/28/2020 1343 06/29/2020 1859 Full Code [id number]  Patsey Berthold, NP Inpatient      Advance Directive Documentation    Flowsheet Row Most Recent Value  Type of Advance Directive Healthcare Power of Attorney, Living will  Pre-existing out of facility DNR order (yellow form or  pink MOST form) --  "MOST" Form in Place? --         IV Access:   Peripheral IV   Procedures and diagnostic studies:   No results found.   Medical Consultants:   None.   Subjective:    SHAYLYNNE LUNT sedated this morning, but no pain  Objective:    Vitals:   11/19/23 0045 11/19/23 0100 11/19/23 0136 11/19/23 0313  BP: (!) 105/50 (!) 127/47 (!) 116/45 (!) 121/53  Pulse:  86 83 90  Resp: (!) 27 (!) 28 (!) 28 (!) 27  Temp:   98.2 F (36.8 C) 98 F (36.7 C)  TempSrc:   Oral Axillary  SpO2: 100% 94% 100% 96%  Weight:      Height:       SpO2: 96 % O2 Flow Rate (L/min): 4 L/min FiO2 (%): 30 %   Intake/Output Summary (Last 24 hours) at 11/19/2023 0739 Last data filed at 11/19/2023 0548 Gross per 24 hour  Intake 1650.21 ml  Output 3495 ml  Net -1844.79 ml   Filed Weights   11/16/23 0500 11/17/23 0600 11/18/23 0217  Weight: 52.7 kg 52.7 kg 49.6 kg    Exam: General exam: In no acute distress. Respiratory system: Good air movement and clear to auscultation. Cardiovascular system: S1 & S2 heard, RRR. No JVD. Gastrointestinal system: Abdomen is nondistended, soft and nontender.  Extremities: No pedal edema. Skin: No rashes, lesions or ulcers Psychiatry: No judgment or insight of medical condition.   Data Reviewed:    Labs: Basic Metabolic Panel: Recent Labs  Lab 11/16/23 0419 11/16/23 1358 11/16/23 2243 11/17/23 0424 11/17/23 0817 11/17/23 1333 11/17/23 1604 11/18/23 0311 11/18/23 1130 11/19/23 0458  NA 130*   < > 130* 131* 135 141 134* 143 143 143  K 3.9   < > 4.4 4.2 3.6 3.2* 4.2 2.8* 4.4 3.0*  CL 94*   < > 91* 92* 93*  --  91* 97* 97* 99  CO2 26   < > 25 27 29   --  29 33* 33* 33*  GLUCOSE 731*   < > 717* 490* 230*  --  590* 133* 160* 121*  BUN 53*   < > 53* 56* 56*  --  53* 59* 57* 60*  CREATININE 1.51*   < > 1.42* 1.49* 1.55*  --  1.44* 1.42* 1.50* 1.25*  CALCIUM 8.9   < > 8.7* 8.6* 8.6*  --  8.7* 9.1 10.7* 9.7  MG 2.7*  --  2.0 2.0 1.8   --   --  2.1  --  1.9  PHOS 1.3*  --   --  3.5 2.9  --   --  2.9  --  3.5   < > = values in this interval not displayed.   GFR Estimated Creatinine Clearance: 29 mL/min (A) (by C-G formula based on SCr of 1.25 mg/dL (H)). Liver Function Tests: Recent Labs  Lab 11/15/23 0151 11/16/23 0419 11/17/23 0424 11/18/23 0311 11/19/23 0458  AST 30 27 30  32 29  ALT 22 18 24 29 29   ALKPHOS 63 53 77 80 87  BILITOT 0.5 1.3* 0.7  0.6 0.6  PROT 4.3* 4.9* 4.9* 5.5* 5.5*  ALBUMIN 1.6* 2.8* 2.6* 2.6* 2.5*   No results for input(s): "LIPASE", "AMYLASE" in the last 168 hours. No results for input(s): "AMMONIA" in the last 168 hours. Coagulation profile No results for input(s): "INR", "PROTIME" in the last 168 hours. COVID-19 Labs  No results for input(s): "DDIMER", "FERRITIN", "LDH", "CRP" in the last 72 hours.  Lab Results  Component Value Date   SARSCOV2NAA NEGATIVE 06/26/2020    CBC: Recent Labs  Lab 11/15/23 1513 11/16/23 0419 11/17/23 0424 11/17/23 1333 11/18/23 0311 11/19/23 0458  WBC 10.8* 9.7 12.9*  --  15.1* 15.3*  HGB 9.1* 8.0* 8.7* 7.8* 9.7* 10.5*  HCT 27.0* 24.2* 26.9* 23.0* 29.9* 32.5*  MCV 89.1 91.7 93.4  --  91.2 91.8  PLT 79* 68* 92*  --  173 285   Cardiac Enzymes: No results for input(s): "CKTOTAL", "CKMB", "CKMBINDEX", "TROPONINI" in the last 168 hours. BNP (last 3 results) No results for input(s): "PROBNP" in the last 8760 hours. CBG: Recent Labs  Lab 11/18/23 0509 11/18/23 1147 11/18/23 1735 11/18/23 2353 11/19/23 0508  GLUCAP 124* 115* 93 91 124*   D-Dimer: No results for input(s): "DDIMER" in the last 72 hours. Hgb A1c: No results for input(s): "HGBA1C" in the last 72 hours. Lipid Profile: Recent Labs    11/17/23 0424 11/17/23 0817  TRIG 233* 75   Thyroid function studies: No results for input(s): "TSH", "T4TOTAL", "T3FREE", "THYROIDAB" in the last 72 hours.  Invalid input(s): "FREET3" Anemia work up: No results for input(s): "VITAMINB12",  "FOLATE", "FERRITIN", "TIBC", "IRON", "RETICCTPCT" in the last 72 hours. Sepsis Labs: Recent Labs  Lab 11/12/23 1635 11/13/23 0728 11/13/23 3557 11/14/23 0954 11/15/23 1513 11/16/23 0419 11/17/23 0424 11/18/23 0311 11/19/23 0458  WBC  --   --    < >  --    < > 9.7 12.9* 15.1* 15.3*  LATICACIDVEN 2.5* 2.6*  --  1.3  --   --   --   --   --    < > = values in this interval not displayed.   Microbiology Recent Results (from the past 240 hours)  MRSA Next Gen by PCR, Nasal     Status: None   Collection Time: 11/11/23  5:01 PM   Specimen: Nasal Mucosa; Nasal Swab  Result Value Ref Range Status   MRSA by PCR Next Gen NOT DETECTED NOT DETECTED Final    Comment: (NOTE) The GeneXpert MRSA Assay (FDA approved for NASAL specimens only), is one component of a comprehensive MRSA colonization surveillance program. It is not intended to diagnose MRSA infection nor to guide or monitor treatment for MRSA infections. Test performance is not FDA approved in patients less than 29 years old. Performed at [facility], [address], [place], [place] [address]   Blood culture (routine x 2)     Status: None   Collection Time: 11/11/23  5:18 PM   Specimen: BLOOD RIGHT ARM  Result Value Ref Range Status   Specimen Description BLOOD RIGHT ARM  Final   Special Requests   Final    BOTTLES DRAWN AEROBIC AND ANAEROBIC Blood Culture results may not be optimal due to an inadequate volume of blood received in culture bottles   Culture   Final    NO GROWTH 5 DAYS Performed at [facility], [address], [place], [place] [address]    Report Status 11/16/2023 FINAL  Final  Blood culture (routine x  2)     Status: None   Collection Time: 11/11/23  5:18 PM   Specimen: BLOOD RIGHT ARM  Result Value Ref Range Status   Specimen Description BLOOD RIGHT ARM  Final   Special Requests   Final    BOTTLES DRAWN AEROBIC AND ANAEROBIC Blood Culture results may not be optimal due to an inadequate  volume of blood received in culture bottles   Culture   Final    NO GROWTH 5 DAYS Performed at [facility], [address], [place], [place] [address]    Report Status 11/16/2023 FINAL  Final  Culture, Respiratory w Gram Stain     Status: None (Preliminary result)   Collection Time: 11/16/23 12:21 PM   Specimen: Tracheal Aspirate; Respiratory  Result Value Ref Range Status   Specimen Description TRACHEAL ASPIRATE  Final   Special Requests NONE  Final   Gram Stain   Final    FEW WBC PRESENT, PREDOMINANTLY PMN FEW GRAM NEGATIVE RODS    Culture   Final    CULTURE REINCUBATED FOR BETTER GROWTH Performed at [facility], [address], [place], [place] [address]    Report Status PENDING  Incomplete     Medications:    arformoterol  15 mcg Nebulization BID   budesonide (PULMICORT) nebulizer solution  0.5 mg Nebulization BID   busPIRone  5 mg Oral BID   Chlorhexidine Gluconate Cloth  6 each Topical Daily   gabapentin  100 mg Oral BID WC   gabapentin  300 mg Oral QHS   methocarbamol  500 mg Oral TID   montelukast  10 mg Oral QHS   nicotine  21 mg Transdermal Daily   OLANZapine  5 mg Intravenous Once   pantoprazole (PROTONIX) IV  40 mg Intravenous Daily   revefenacin  175 mcg Nebulization Daily   sodium chloride flush  10-40 mL Intracatheter Q12H   sterile water (preservative free)       Continuous Infusions:  amiodarone 30 mg/hr (11/19/23 0051)   heparin 750 Units/hr (11/19/23 0717)   meropenem (MERREM) IV 1 g (11/18/23 2102)   TPN ADULT (ION) 55 mL/hr at 11/18/23 1900      LOS: 8 days   Marinda Elk  [facility]  11/19/2023, 7:39 AM

## 2023-11-19 NOTE — Progress Notes (Signed)
 OT Cancellation Note  Patient Details Name: Jenny White MRN: [id number] DOB: 1946-06-26   Cancelled Treatment:    Reason Eval/Treat Not Completed: Patient's level of consciousness Bipap mask 40% peep 5 NIV pressure vent medications 3/11 ativan 23:50pm,dilaudid 15:19 noroc 23:20PM. Family present and agreeable that OT attempting at a more appropriate time is reasonable.   [name] 11/19/2023, 3:50 PM

## 2023-11-19 NOTE — Care Management Important Message (Signed)
 Important Message  Patient Details  Name: Jenny White MRN: [id number] Date of Birth: 09-09-1946   Important Message Given:  Yes - Medicare IM     Sherilyn Banker 11/19/2023, 2:47 PM

## 2023-11-19 NOTE — Progress Notes (Signed)
   Progress Note  6 Days Post-Op  Subjective: Transferred to progressive care. Coughed up secretions this morning, but no vomiting. Patient denies nausea at time of my exam.  Objective: Vital signs in last 24 hours: Temp:  [97.7 F (36.5 C)-98.2 F (36.8 C)] 97.7 F (36.5 C) (03/12 0740) Pulse Rate:  [75-136] 95 (03/12 0740) Resp:  [16-35] 27 (03/12 0740) BP: (88-154)/(45-94) 153/75 (03/12 0740) SpO2:  [89 %-100 %] 95 % (03/12 0831) Last BM Date : 11/18/23  Intake/Output from previous day: 03/11 0701 - 03/12 0700 In: 1650.2 [I.V.:1159; IV Piggyback:491.2] Out: 3495 [Urine:2845; Stool:650] Intake/Output this shift: No intake/output data recorded.  PE: General: NAD, appears weak Heart: RRR Lungs: respirations nonlabored, receiving a breathing treatment Abd: mildly distended but soft, ileostomy pink and productive of liquid stool. Midline incision clean and dry with staples in place, no erythema or induration. GU: foley draining clear yellow urine    Lab Results:  Recent Labs    11/18/23 0311 11/19/23 0458  WBC 15.1* 15.3*  HGB 9.7* 10.5*  HCT 29.9* 32.5*  PLT 173 285   BMET Recent Labs    11/18/23 1130 11/19/23 0458  NA 143 143  K 4.4 3.0*  CL 97* 99  CO2 33* 33*  GLUCOSE 160* 121*  BUN 57* 60*  CREATININE 1.50* 1.25*  CALCIUM 10.7* 9.7   PT/INR No results for input(s): "LABPROT", "INR" in the last 72 hours. CMP     Component Value Date/Time   NA 143 11/19/2023 0458   K 3.0 (L) 11/19/2023 0458   CL 99 11/19/2023 0458   CO2 33 (H) 11/19/2023 0458   GLUCOSE 121 (H) 11/19/2023 0458   BUN 60 (H) 11/19/2023 0458   CREATININE 1.25 (H) 11/19/2023 0458   CREATININE 0.68 10/15/2023 1137   CALCIUM 9.7 11/19/2023 0458   PROT 5.5 (L) 11/19/2023 0458   ALBUMIN 2.5 (L) 11/19/2023 0458   AST 29 11/19/2023 0458   AST 20 10/15/2023 1137   ALT 29 11/19/2023 0458   ALT 15 10/15/2023 1137   ALKPHOS 87 11/19/2023 0458   BILITOT 0.6 11/19/2023 0458   BILITOT  0.4 10/15/2023 1137   GFRNONAA 44 (L) 11/19/2023 0458   GFRNONAA >60 10/15/2023 1137   Lipase     Component Value Date/Time   LIPASE 44 11/11/2023 1127      Assessment/Plan POD 8/6 S/P ex-lap small bowel resection, left open; ex-lap, small bowel resection, ileostomy and closure, Dr. Dwain Sarna for SBO - Ileostomy functioning - NG removed yesterday, no vomiting but patient remains mildly distended with a tenuous respiratory status. Would continue to hold enteral feeds given distension and aspiration risk. Continue TPN. - wicks removed from midline wound 3/10 - On antibiotics for treatment of aspiration pneumonia. Does not need antibiotics for intraabdominal coverage. -pulm toilet, IS -PT/OT to mobilize the patient. VTE: on heparin gtt for a-fib   LOS: 8 days     Fritzi Mandes, MD [facility] Surgery 11/19/2023, 8:50 AM Please see Amion for pager number during day hours 7:00am-4:30pm

## 2023-11-19 NOTE — TOC Progression Note (Signed)
 Transition of Care [facility]) - Progression Note    Patient Details  Name: KARL KNARR MRN: [id number] Date of Birth: 08/29/1946  Transition of [facility]) CM/SW Contact  Eduard Roux, [place] Phone Number: 11/19/2023, 3:15 PM  Clinical Narrative:     TOC continues to follow  Expected Discharge Plan: Home w Home Health Services Barriers to Discharge: Continued Medical Work up  Expected Discharge Plan and Services   Discharge Planning Services: CM Consult Post Acute Care Choice: Home Health Living arrangements for the past 2 months: Single Family Home                                       Social Determinants of Health (SDOH) Interventions SDOH Screenings   Food Insecurity: No Food Insecurity (11/11/2023)  Housing: Low Risk  (11/11/2023)  Transportation Needs: No Transportation Needs (11/11/2023)  Utilities: Not At Risk (11/11/2023)  Depression (PHQ2-9): Low Risk  (06/17/2023)  Financial Resource Strain: Low Risk  (11/06/2023)   Received from [facility]  Social Connections: Unknown (11/11/2023)  Tobacco Use: Medium Risk (11/11/2023)    Readmission Risk Interventions     No data to display

## 2023-11-19 NOTE — Progress Notes (Signed)
 NAME:  Jenny White, MRN:  [id number], DOB:  [birth year], LOS: 8 ADMISSION DATE:  11/11/2023 CONSULTATION DATE:  11/11/2023 REFERRING MD:  Dwain Sarna - CCS CHIEF COMPLAINT: Shock  History of Present Illness:  Jenny White 78 year old woman with a pmhx significant for COPD, GERD, nephrolithiasis , HTN, OSA, Lung cancer ( Adenocarcinoma, diagnosed 05/2023) who presented to the emergency room with complaints of abdominal pain with N/V for the past week. She states the abdominal  pain got worsened after dinner. She reported experiencing nausea and  vomiting this morning.   In the ED  her labs were significant for a lactate 4.1 After fluid resuscitation, Lactate decreased to 2.5. CT scan showed high grade small bowel obstruction with transition point in the right lower quadrant along the distal ileum. Some trace of ascites and mesenteric stranding.Concern for bowel wall ischemia. Broad spectrum antibiotics ( zosyn) was started, blood cultures pending.   Patient was taken to the ER emergently with CCS 3/4 for Ex-lap / LOA. Intraoperative course  was notable for free fluid on entry, with closed loop bowel obstruction noted secondary to tight band adhesion. Patient left in discontinuity/ anastomosis not completed with open abdomen and plan for second look in 24-48 hrs. Patient remained intubated and was transferred to ICU in critical condition.   Pertinent Medical History:   Past Medical History:  Diagnosis Date   Arthritis    in back   COPD (chronic obstructive pulmonary disease) (HCC)    Difficult airway    Due to limited oral opening. Elective glidescope used previously.   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 Elevated LA found to have SBO taken emergently to surgery for EX-LAP.  3/7 extubated reintubated 3/10 extubated again 3/11 OFF NIPPV 3/12 got ativan at HS. Sleepy and difficult to arouse all day. Stat ABG. Stopping ativan   Interim History / Subjective:  Sedated not really interactive. Got ativan last evening  Objective:  Blood pressure (!) 153/75, pulse 95, temperature 97.7 F (36.5 C), temperature source Oral, resp. rate (!) 27, height 5\' 7"  (1.702 m), weight 49.6 kg, SpO2 95%.        Intake/Output Summary (Last 24 hours) at 11/19/2023 1121 Last data filed at 11/19/2023 1110 Gross per 24 hour  Intake 535.91 ml  Output 2050 ml  Net -1514.09 ml   Filed Weights   11/16/23 0500 11/17/23 0600 11/18/23 0217  Weight: 52.7 kg 52.7 kg 49.6 kg   Physical Examination: General this is a 78 year old female who is laying in bed. Eyes closed. Mouth open. Sp fairly slurred and dysarthric. Required assist w/ oral sxn earlier for sputum  HENT no JVD MM dry.  Pulm scattered rhonchi. Decreased on right. Currently on 6 lpm sats 94% resp pattern not really labored Card rrr Abd soft Ext no sig edema  Neuro sedated. Follows commands. Oriented x 3 but very slow to respond. Can't open eyes. Sp difficult to understand  Resolved   Postop vasoplegia/ septic shock  Assessment & Plan:  Acute  encephalopathy (3/12 new) High grade SBO w/ bowel ischemia w/ peritonitis- ex lap, SB resection, abthera 3/4, further resection, end ileostomy, and abd wall closure 3/6 Advanced COPD RLL Lung cancer- s/p SBRT with good response Need for sedation with mechanical ventilation Baseline chronic pain/anxiety Acute kidney injury in  context of peritonitis- improving Pulmonary edema Recurrent Afib- despite amio  Pulm problem list  Acute on chronic resp failure 2/2 volume overload +/- aspiration s/p expl. Lap for  SBO, complicated by underlying COPD, anxiety disorder and on-going decreased abd compliance  -successfully extubated 3/10 -current O2 needs:  -prelim sputum growing GNR  Plan Day 3 meropenem w/ GNR in sputum. Will await sensitivities and narrow as indicated (if this is Not a resistant organism we can likely stop abx as already had several days of zosyn) Cont to wean FIO2 Scheduled BDs (brovana/budesonide and yupelri) IS & flutter Mobility Pain control  Repeat CXR   Acute encephalopathy. ? Toxic from ativan ? Hypercarbia?  -per family report to bedside RN pt does not tolerate ativan well.  Plan Stat ABG Dc ativan  If hypercarbia can trial BIPAP briefly to see if this improves MS   Best Practice: (right click and "Reselect all SmartList Selections" daily)   Diet/type: TPN DVT prophylaxis: heparin gtt GI prophylaxis: PPI Lines: Arterial Line and PICC line Foley:  Yes, and it is still needed Code Status:  full code Last date of multidisciplinary goals of care discussion: 3/11  My cct 32 min

## 2023-11-20 ENCOUNTER — Inpatient Hospital Stay (HOSPITAL_COMMUNITY)

## 2023-11-20 DIAGNOSIS — Z9889 Other specified postprocedural states: Secondary | ICD-10-CM | POA: Diagnosis not present

## 2023-11-20 DIAGNOSIS — R579 Shock, unspecified: Secondary | ICD-10-CM | POA: Diagnosis not present

## 2023-11-20 DIAGNOSIS — E46 Unspecified protein-calorie malnutrition: Secondary | ICD-10-CM

## 2023-11-20 DIAGNOSIS — J9622 Acute and chronic respiratory failure with hypercapnia: Secondary | ICD-10-CM

## 2023-11-20 DIAGNOSIS — J690 Pneumonitis due to inhalation of food and vomit: Secondary | ICD-10-CM | POA: Diagnosis not present

## 2023-11-20 DIAGNOSIS — G9341 Metabolic encephalopathy: Secondary | ICD-10-CM

## 2023-11-20 DIAGNOSIS — R569 Unspecified convulsions: Secondary | ICD-10-CM

## 2023-11-20 DIAGNOSIS — J189 Pneumonia, unspecified organism: Secondary | ICD-10-CM | POA: Diagnosis not present

## 2023-11-20 DIAGNOSIS — E119 Type 2 diabetes mellitus without complications: Secondary | ICD-10-CM

## 2023-11-20 DIAGNOSIS — J449 Chronic obstructive pulmonary disease, unspecified: Secondary | ICD-10-CM | POA: Diagnosis not present

## 2023-11-20 DIAGNOSIS — J9621 Acute and chronic respiratory failure with hypoxia: Secondary | ICD-10-CM | POA: Diagnosis not present

## 2023-11-20 DIAGNOSIS — K56609 Unspecified intestinal obstruction, unspecified as to partial versus complete obstruction: Secondary | ICD-10-CM | POA: Diagnosis not present

## 2023-11-20 LAB — BASIC METABOLIC PANEL
Anion gap: 8 (ref 5–15)
BUN: 55 mg/dL — ABNORMAL HIGH (ref 8–23)
CO2: 28 mmol/L (ref 22–32)
Calcium: 8.8 mg/dL — ABNORMAL LOW (ref 8.9–10.3)
Chloride: 113 mmol/L — ABNORMAL HIGH (ref 98–111)
Creatinine, Ser: 0.8 mg/dL (ref 0.44–1.00)
GFR, Estimated: >60 mL/min (ref >60)
Glucose, Bld: 134 mg/dL — ABNORMAL HIGH (ref 70–99)
Potassium: 4.5 mmol/L (ref 3.5–5.1)
Sodium: 149 mmol/L — ABNORMAL HIGH (ref 135–145)

## 2023-11-20 LAB — POCT I-STAT 7, (LYTES, BLD GAS, ICA,H+H)
Acid-Base Excess: 5 mmol/L — ABNORMAL HIGH (ref 0.0–2.0)
Bicarbonate: 30 mmol/L — ABNORMAL HIGH (ref 20.0–28.0)
Calcium, Ion: 1.3 mmol/L (ref 1.15–1.40)
HCT: 26 % — ABNORMAL LOW (ref 36.0–46.0)
Hemoglobin: 8.8 g/dL — ABNORMAL LOW (ref 12.0–15.0)
O2 Saturation: 100 %
Patient temperature: 99.5
Potassium: 4.5 mmol/L (ref 3.5–5.1)
Sodium: 149 mmol/L — ABNORMAL HIGH (ref 135–145)
TCO2: 31 mmol/L (ref 22–32)
pCO2 arterial: 44.3 mmHg (ref 32–48)
pH, Arterial: 7.44 (ref 7.35–7.45)
pO2, Arterial: 243 mmHg — ABNORMAL HIGH (ref 83–108)

## 2023-11-20 LAB — CBC
HCT: 30.6 % — ABNORMAL LOW (ref 36.0–46.0)
Hemoglobin: 9.4 g/dL — ABNORMAL LOW (ref 12.0–15.0)
MCH: 30.4 pg (ref 26.0–34.0)
MCHC: 30.7 g/dL (ref 30.0–36.0)
MCV: 99 fL (ref 80.0–100.0)
Platelets: 389 10*3/uL (ref 150–400)
RBC: 3.09 MIL/uL — ABNORMAL LOW (ref 3.87–5.11)
RDW: 19 % — ABNORMAL HIGH (ref 11.5–15.5)
WBC: 18.4 10*3/uL — ABNORMAL HIGH (ref 4.0–10.5)
nRBC: 1.1 % — ABNORMAL HIGH (ref 0.0–0.2)

## 2023-11-20 LAB — COMPREHENSIVE METABOLIC PANEL
ALT: 38 U/L (ref 0–44)
AST: 39 U/L (ref 15–41)
Albumin: 2.2 g/dL — ABNORMAL LOW (ref 3.5–5.0)
Alkaline Phosphatase: 120 U/L (ref 38–126)
Anion gap: 4 — ABNORMAL LOW (ref 5–15)
BUN: 53 mg/dL — ABNORMAL HIGH (ref 8–23)
CO2: 34 mmol/L — ABNORMAL HIGH (ref 22–32)
Calcium: 9.1 mg/dL (ref 8.9–10.3)
Chloride: 113 mmol/L — ABNORMAL HIGH (ref 98–111)
Creatinine, Ser: 0.91 mg/dL (ref 0.44–1.00)
GFR, Estimated: >60 mL/min (ref >60)
Glucose, Bld: 111 mg/dL — ABNORMAL HIGH (ref 70–99)
Potassium: 4.3 mmol/L (ref 3.5–5.1)
Sodium: 151 mmol/L — ABNORMAL HIGH (ref 135–145)
Total Bilirubin: 0.3 mg/dL (ref 0.0–1.2)
Total Protein: 4.9 g/dL — ABNORMAL LOW (ref 6.5–8.1)

## 2023-11-20 LAB — PHOSPHORUS
Phosphorus: 5.6 mg/dL — ABNORMAL HIGH (ref 2.5–4.6)
Phosphorus: 3.2 mg/dL (ref 2.5–4.6)

## 2023-11-20 LAB — HEPARIN LEVEL (UNFRACTIONATED)
Heparin Unfractionated: 0.1 [IU]/mL — ABNORMAL LOW (ref 0.30–0.70)
Heparin Unfractionated: <0.1 [IU]/mL — ABNORMAL LOW (ref 0.30–0.70)
Heparin Unfractionated: 0.43 [IU]/mL (ref 0.30–0.70)

## 2023-11-20 LAB — MAGNESIUM
Magnesium: 2.7 mg/dL — ABNORMAL HIGH (ref 1.7–2.4)
Magnesium: 2.3 mg/dL (ref 1.7–2.4)

## 2023-11-20 LAB — GLUCOSE, CAPILLARY
Glucose-Capillary: 92 mg/dL (ref 70–99)
Glucose-Capillary: 153 mg/dL — ABNORMAL HIGH (ref 70–99)
Glucose-Capillary: 152 mg/dL — ABNORMAL HIGH (ref 70–99)
Glucose-Capillary: 147 mg/dL — ABNORMAL HIGH (ref 70–99)

## 2023-11-20 LAB — HEMOGLOBIN AND HEMATOCRIT, BLOOD
HCT: 31 % — ABNORMAL LOW (ref 36.0–46.0)
Hemoglobin: 9.6 g/dL — ABNORMAL LOW (ref 12.0–15.0)

## 2023-11-20 MED ORDER — NOREPINEPHRINE 4 MG/250ML-% IV SOLN
INTRAVENOUS | Status: AC
  Filled 2023-11-20: qty 250

## 2023-11-20 MED ORDER — DEXMEDETOMIDINE HCL IN NACL 400 MCG/100ML IV SOLN: 0.0000 ug/kg/h | INTRAVENOUS | Status: DC

## 2023-11-20 MED ORDER — SODIUM BICARBONATE 8.4 % IV SOLN
50.0000 meq | Freq: Once | INTRAVENOUS | Status: AC
  Administered 2023-11-20: 50 meq via INTRAVENOUS
  Filled 2023-11-20: qty 50

## 2023-11-20 MED ORDER — NOREPINEPHRINE 4 MG/250ML-% IV SOLN
0.0000 ug/min | INTRAVENOUS | Status: DC
  Administered 2023-11-20: 10 ug/min via INTRAVENOUS
  Administered 2023-11-20: 12 ug/min via INTRAVENOUS
  Administered 2023-11-21: 4 ug/min via INTRAVENOUS
  Administered 2023-11-21: 9 ug/min via INTRAVENOUS
  Administered 2023-11-22: 6 ug/min via INTRAVENOUS
  Administered 2023-11-22: 5 ug/min via INTRAVENOUS
  Administered 2023-11-23: 8 ug/min via INTRAVENOUS
  Administered 2023-11-24: 20 ug/min via INTRAVENOUS
  Administered 2023-11-24: 11 ug/min via INTRAVENOUS
  Administered 2023-11-24: 9 ug/min via INTRAVENOUS
  Administered 2023-11-25: 14 ug/min via INTRAVENOUS
  Administered 2023-11-25: 8 ug/min via INTRAVENOUS
  Administered 2023-11-26: 7 ug/min via INTRAVENOUS
  Administered 2023-11-27: 6 ug/min via INTRAVENOUS
  Administered 2023-11-28: 1 ug/min via INTRAVENOUS
  Administered 2023-11-28: 3 ug/min via INTRAVENOUS
  Filled 2023-11-20 (×17): qty 250

## 2023-11-20 MED ORDER — NALOXONE HCL 0.4 MG/ML IJ SOLN
INTRAMUSCULAR | Status: AC
  Filled 2023-11-20: qty 1

## 2023-11-20 MED ORDER — PHENYLEPHRINE HCL-NACL 20-0.9 MG/250ML-% IV SOLN
0.0000 ug/min | INTRAVENOUS | Status: DC
Start: 2023-11-20 — End: 2023-11-21
  Administered 2023-11-20: 20 ug/min via INTRAVENOUS

## 2023-11-20 MED ORDER — DEXMEDETOMIDINE HCL IN NACL 400 MCG/100ML IV SOLN
0.0000 ug/kg/h | INTRAVENOUS | Status: DC
Start: 2023-11-20 — End: 2023-11-23
  Administered 2023-11-20: 0.4 ug/kg/h via INTRAVENOUS
  Administered 2023-11-20 (×2): 1.2 ug/kg/h via INTRAVENOUS
  Administered 2023-11-21: 1.4 ug/kg/h via INTRAVENOUS
  Administered 2023-11-21: 1 ug/kg/h via INTRAVENOUS
  Administered 2023-11-21: 1.2 ug/kg/h via INTRAVENOUS
  Administered 2023-11-22 – 2023-11-23 (×6): 1.1 ug/kg/h via INTRAVENOUS
  Filled 2023-11-20 (×11): qty 100

## 2023-11-20 MED ORDER — SUCCINYLCHOLINE CHLORIDE 200 MG/10ML IV SOSY
PREFILLED_SYRINGE | INTRAVENOUS | Status: AC
  Filled 2023-11-20: qty 10

## 2023-11-20 MED ORDER — FENTANYL CITRATE PF 50 MCG/ML IJ SOSY
25.0000 ug | PREFILLED_SYRINGE | INTRAMUSCULAR | Status: DC | PRN
  Administered 2023-11-20: 25 ug via INTRAVENOUS

## 2023-11-20 MED ORDER — FENTANYL 2500MCG IN NS 250ML (10MCG/ML) PREMIX INFUSION
0.0000 ug/h | INTRAVENOUS | Status: AC
  Administered 2023-11-20: 25 ug/h via INTRAVENOUS
  Administered 2023-11-21 – 2023-11-22 (×3): 150 ug/h via INTRAVENOUS
  Filled 2023-11-20 (×4): qty 250

## 2023-11-20 MED ORDER — FENTANYL CITRATE PF 50 MCG/ML IJ SOSY
25.0000 ug | PREFILLED_SYRINGE | INTRAMUSCULAR | Status: DC | PRN
  Administered 2023-11-20: 50 ug via INTRAVENOUS
  Filled 2023-11-20: qty 1

## 2023-11-20 MED ORDER — ROCURONIUM BROMIDE 10 MG/ML (PF) SYRINGE
PREFILLED_SYRINGE | INTRAVENOUS | Status: AC
Start: 2023-11-20 — End: 2023-11-20
  Filled 2023-11-20: qty 10

## 2023-11-20 MED ORDER — TRACE MINERALS CU-MN-SE-ZN 300-55-60-3000 MCG/ML IV SOLN
INTRAVENOUS | Status: AC
  Filled 2023-11-20: qty 576

## 2023-11-20 MED ORDER — MIDAZOLAM HCL 2 MG/2ML IJ SOLN
2.0000 mg | Freq: Once | INTRAMUSCULAR | Status: AC
Start: 2023-11-20 — End: 2023-11-20
  Administered 2023-11-20: 2 mg via INTRAVENOUS

## 2023-11-20 MED ORDER — VASOPRESSIN 20 UNITS/100 ML INFUSION FOR SHOCK
0.0000 [IU]/min | INTRAVENOUS | Status: DC
Start: 2023-11-20 — End: 2023-11-21
  Administered 2023-11-20: 0.03 [IU]/min via INTRAVENOUS

## 2023-11-20 MED ORDER — DEXMEDETOMIDINE HCL IN NACL 400 MCG/100ML IV SOLN
INTRAVENOUS | Status: AC
  Filled 2023-11-20: qty 100

## 2023-11-20 MED ORDER — DOCUSATE SODIUM 50 MG/5ML PO LIQD
100.0000 mg | Freq: Two times a day (BID) | ORAL | Status: DC
  Administered 2023-11-22 – 2023-11-25 (×7): 100 mg
  Filled 2023-11-20 (×8): qty 10

## 2023-11-20 MED ORDER — ROCURONIUM BROMIDE 10 MG/ML (PF) SYRINGE
PREFILLED_SYRINGE | INTRAVENOUS | Status: AC
  Filled 2023-11-20: qty 10

## 2023-11-20 MED ORDER — ETOMIDATE 2 MG/ML IV SOLN
INTRAVENOUS | Status: AC
  Filled 2023-11-20: qty 10

## 2023-11-20 MED ORDER — HYDROCORTISONE SOD SUC (PF) 100 MG IJ SOLR
100.0000 mg | Freq: Two times a day (BID) | INTRAMUSCULAR | Status: DC
  Administered 2023-11-20 – 2023-11-23 (×7): 100 mg via INTRAVENOUS
  Filled 2023-11-20 (×7): qty 2

## 2023-11-20 MED ORDER — TOBRAMYCIN 300 MG/5ML IN NEBU
300.0000 mg | INHALATION_SOLUTION | Freq: Two times a day (BID) | RESPIRATORY_TRACT | Status: DC
  Administered 2023-11-20 – 2023-11-23 (×6): 300 mg via RESPIRATORY_TRACT
  Filled 2023-11-20 (×8): qty 5

## 2023-11-20 MED ORDER — VASOPRESSIN 20 UNITS/100 ML INFUSION FOR SHOCK
INTRAVENOUS | Status: AC
Start: 2023-11-20 — End: 2023-11-20
  Filled 2023-11-20: qty 100

## 2023-11-20 MED ORDER — PHENYLEPHRINE 80 MCG/ML (10ML) SYRINGE FOR IV PUSH (FOR BLOOD PRESSURE SUPPORT)
PREFILLED_SYRINGE | INTRAVENOUS | Status: AC
  Filled 2023-11-20: qty 10

## 2023-11-20 MED ORDER — POLYETHYLENE GLYCOL 3350 17 G PO PACK
17.0000 g | PACK | Freq: Every day | ORAL | Status: DC
  Administered 2023-11-22 – 2023-11-25 (×3): 17 g
  Filled 2023-11-20 (×3): qty 1

## 2023-11-20 MED ORDER — PHENYLEPHRINE HCL-NACL 20-0.9 MG/250ML-% IV SOLN
INTRAVENOUS | Status: AC
  Filled 2023-11-20: qty 250

## 2023-11-20 NOTE — Progress Notes (Signed)
 OT Cancellation Note  Patient Details Name: Jenny White MRN: [id number] DOB: 01-02-1946   Cancelled Treatment:    Reason Eval/Treat Not Completed: Medical issues which prohibited therapy.  Pt transferred to Neuro ICU from progressive and re-intubated.  Will hold eval at this time and check back tomorrow for medical readiness.  [name] 11/20/2023, 10:24 AM

## 2023-11-20 NOTE — Progress Notes (Signed)
 PHARMACY - ANTICOAGULATION CONSULT NOTE  Pharmacy Consult for heparin Indication: atrial fibrillation  No Known Allergies  Patient Measurements: Height: 5' 7.01" (170.2 cm) Weight: 57.3 kg (126 lb 5.2 oz) IBW/kg (Calculated) : 61.62 Heparin Dosing Weight: 49.6 kg   Vital Signs: Temp: 98.9 F (37.2 C) (03/13 2000) Temp Source: Axillary (03/13 2000) BP: 124/49 (03/13 2030) Pulse Rate: 64 (03/13 2030)  Labs: Recent Labs    11/18/23 0311 11/18/23 1130 11/19/23 0458 11/19/23 1400 11/20/23 0100 11/20/23 0504 11/20/23 0630 11/20/23 1009 11/20/23 1129 11/20/23 2102  HGB 9.7*  --  10.5*  --   --  9.4*  --  8.8* 9.6*  --   HCT 29.9*  --  32.5*  --   --  30.6*  --  26.0* 31.0*  --   PLT 173  --  285  --   --  389  --   --   --   --   HEPARINUNFRC  --    < > <0.10*   < > 0.10*  --   --   --  <0.10* 0.43  CREATININE 1.42*   < > 1.25*  --   --   --  0.91  --  0.80  --    < > = values in this interval not displayed.    Estimated Creatinine Clearance: 52.4 mL/min (by C-G formula based on SCr of 0.8 mg/dL).  Assessment: 64 yof presenting with abdominal pain now s/p ex-lap small bowel resection followed by small bowel resection, ileostomy and closure. Has been going in/out of Afib (no AC PTA). Was on subQ heparin for DVT ppx (LD 3/11@0516 ).   HL <0.10 - subtherapeutic, no issues with running continuously per RN (did pause briefly during RSI earlier) or signs/symptoms of bleeding.   Heparin level tonight came back therapeutic at 0.43, on heparin infusion at 1150 units/hr. No s/sx of bleeding or infusion issues. Confirmed drawn from opposite side of where heparin running central line.  Goal of Therapy:  Heparin level 0.3-0.5 units/ml Monitor platelets by anticoagulation protocol: Yes   Plan:  Continue heparin infusion at 1150 units/hr Heparin level in 8 hours with AM labs  Daily heparin level, CBC   Thank you for allowing pharmacy to participate in this patient's  care,  Sherron Monday, PharmD, BCCCP Clinical Pharmacist  Phone: [phone] 11/20/2023 9:46 PM  Please check AMION for all [facility] Pharmacy phone numbers After 10:00 PM, call Main Pharmacy [phone]

## 2023-11-20 NOTE — Progress Notes (Signed)
   Progress Note  7 Days Post-Op  Subjective: On BiPAP this morning. Very lethargic, does not respond to painful stimuli.  Objective: Vital signs in last 24 hours: Temp:  [97.8 F (36.6 C)-99.5 F (37.5 C)] 99.5 F (37.5 C) (03/13 0752) Pulse Rate:  [77-89] 85 (03/13 0752) Resp:  [20-34] 34 (03/13 0752) BP: (90-137)/(40-54) 104/40 (03/13 0752) SpO2:  [93 %-100 %] 95 % (03/13 0752) FiO2 (%):  [40 %] 40 % (03/13 0444) Weight:  [57.3 kg] 57.3 kg (03/13 0330) Last BM Date : 11/18/23  Intake/Output from previous day: 03/12 0701 - 03/13 0700 In: 1713.8 [I.V.:1413.8; IV Piggyback:300] Out: 1875 [Urine:1675; Stool:200] Intake/Output this shift: No intake/output data recorded.  PE: General: lethargic, not responsive to voice or painful stimuli Heart: RRR Lungs: on BiPAP, tachypneic Abd: soft, minimally distended, ileostomy productive of stool. Midline incision clean and dry with staples in place, no erythema or induration.    Lab Results:  Recent Labs    11/19/23 0458 11/20/23 0504  WBC 15.3* 18.4*  HGB 10.5* 9.4*  HCT 32.5* 30.6*  PLT 285 389   BMET Recent Labs    11/19/23 0458 11/20/23 0630  NA 143 151*  K 3.0* 4.3  CL 99 113*  CO2 33* 34*  GLUCOSE 121* 111*  BUN 60* 53*  CREATININE 1.25* 0.91  CALCIUM 9.7 9.1   PT/INR No results for input(s): "LABPROT", "INR" in the last 72 hours. CMP     Component Value Date/Time   NA 151 (H) 11/20/2023 0630   K 4.3 11/20/2023 0630   CL 113 (H) 11/20/2023 0630   CO2 34 (H) 11/20/2023 0630   GLUCOSE 111 (H) 11/20/2023 0630   BUN 53 (H) 11/20/2023 0630   CREATININE 0.91 11/20/2023 0630   CREATININE 0.68 10/15/2023 1137   CALCIUM 9.1 11/20/2023 0630   PROT 4.9 (L) 11/20/2023 0630   ALBUMIN 2.2 (L) 11/20/2023 0630   AST 39 11/20/2023 0630   AST 20 10/15/2023 1137   ALT 38 11/20/2023 0630   ALT 15 10/15/2023 1137   ALKPHOS 120 11/20/2023 0630   BILITOT 0.3 11/20/2023 0630   BILITOT 0.4 10/15/2023 1137    GFRNONAA >60 11/20/2023 0630   GFRNONAA >60 10/15/2023 1137   Lipase     Component Value Date/Time   LIPASE 44 11/11/2023 1127      Assessment/Plan POD 9/7 S/P ex-lap small bowel resection, left open; ex-lap, small bowel resection, ileostomy and closure, Dr. Dwain Sarna for SBO - Patient transferred to ICU after rounds (obtunded with increased respiratory distress). Planning for reintubation. - NG out, abdomen soft, this morning. Will request placement of a post-pyloric Cortrak after intubation for enteral feeds. Ok to start trophic feeds once placed. - On meropenem for treatment of aspiration pneumonia. Does not need antibiotics for intraabdominal coverage. - VTE: on heparin gtt for a-fib - Remainder of care per CCM. Surgery will continue to follow.   LOS: 9 days     Fritzi Mandes, MD [facility] Surgery 11/20/2023, 9:07 AM Please see Amion for pager number during day hours 7:00am-4:30pm

## 2023-11-20 NOTE — Progress Notes (Signed)
 Patient in respiratory distress and somnolent, minimal response to painful stimuli. MD at bedside and contacted ICU team. Plan to transfer to ICU 4N-27 and intubate. Patients son at bedside and agrees with plan.

## 2023-11-20 NOTE — Progress Notes (Signed)
   11/20/23 0444  BiPAP/CPAP/SIPAP  $ Non-Invasive Ventilator  Non-Invasive Vent Subsequent  BiPAP/CPAP/SIPAP SERVO  Mask Type Full face mask  Mask Size Medium  Set Rate 12 breaths/min  Respiratory Rate 29 breaths/min  IPAP 15 cmH20  EPAP 5 cmH2O  Pressure Support 10 cmH20  PEEP 5 cmH20  FiO2 (%) 40 %  Minute Ventilation 11.2  Leak 17  Peak Inspiratory Pressure (PIP) 15  Tidal Volume (Vt) 396  Patient Home Equipment No  Auto Titrate No  CPAP/SIPAP surface wiped down Yes   Pt became increasingly tachypnic throughout the night while off of bipap.  Rt has successfully NT suctioned numoerous times with improved breath sounds and work of breathing.  RT will continue to keep a close watch on pt.

## 2023-11-20 NOTE — Procedures (Signed)
 Intubation Procedure Note  Jenny White  [id number]  [birth year]  Date:11/20/23  Time:9:24 AM   Provider Performing:Yashas Camilli W Mikey Bussing    Procedure: Intubation (31500)  Indication(s) Respiratory Failure  Consent Risks of the procedure as well as the alternatives and risks of each were explained to the patient and/or caregiver.  Consent for the procedure was obtained and is signed in the bedside chart   Anesthesia Etomidate   Time Out Verified patient identification, verified procedure, site/side was marked, verified correct patient position, special equipment/implants available, medications/allergies/relevant history reviewed, required imaging and test results available.   Sterile Technique Usual hand hygeine, masks, and gloves were used   Procedure Description Patient positioned in bed supine.  Sedation given as noted above.  Patient was intubated with endotracheal tube using Glidescope.  View was Grade 1 full glottis .  Number of attempts was 1.  Colorimetric CO2 detector was consistent with tracheal placement.   Complications/Tolerance None; patient tolerated the procedure well. Chest X-ray is ordered to verify placement.   EBL Minimal   Specimen(s) None   Joneen Roach, AGACNP-BC [facility]  See Amion for personal pager PCCM on call pager [phone] until 7pm. Please call Elink 7p-7a. [phone]  11/20/2023 9:24 AM

## 2023-11-20 NOTE — Progress Notes (Signed)
 PT Cancellation Note  Patient Details Name: Jenny White MRN: [id number] DOB: [birth year]   Cancelled Treatment:    Reason Eval/Treat Not Completed: Medical issues which prohibited therapy this morning as pt transferred to Neuro ICU from progressive and re-intubated. PT will continue to follow and re-evaluate as medically appropriate.   [name], PT, DPT   Acute Rehabilitation Department Office [phone] Secure Chat Communication Preferred   Ronnie Derby 11/20/2023, 2:13 PM

## 2023-11-20 NOTE — Progress Notes (Signed)
 PHARMACY - ANTICOAGULATION CONSULT NOTE  Pharmacy Consult for heparin Indication: atrial fibrillation  No Known Allergies  Patient Measurements: Height: 5\' 7"  (170.2 cm) Weight: 57.3 kg (126 lb 5.2 oz) IBW/kg (Calculated) : 61.6 Heparin Dosing Weight: 49.6 kg   Vital Signs: Temp: 99.4 F (37.4 C) (03/13 1158) Temp Source: Axillary (03/13 1158) BP: 137/53 (03/13 1123) Pulse Rate: 64 (03/13 1123)  Labs: Recent Labs    11/18/23 0311 11/18/23 1130 11/18/23 1713 11/19/23 0458 11/19/23 1400 11/20/23 0100 11/20/23 0504 11/20/23 0630 11/20/23 1009 11/20/23 1129  HGB 9.7*  --   --  10.5*  --   --  9.4*  --  8.8* 9.6*  HCT 29.9*  --   --  32.5*  --   --  30.6*  --  26.0* 31.0*  PLT 173  --   --  285  --   --  389  --   --   --   HEPARINUNFRC  --   --    < > <0.10* <0.10* 0.10*  --   --   --  <0.10*  CREATININE 1.42* 1.50*  --  1.25*  --   --   --  0.91  --   --    < > = values in this interval not displayed.    Estimated Creatinine Clearance: 46.1 mL/min (by C-G formula based on SCr of 0.91 mg/dL).  Assessment: 64 yof presenting with abdominal pain now s/p ex-lap small bowel resection followed by small bowel resection, ileostomy and closure. Has been going in/out of Afib (no AC PTA). Was on subQ heparin for DVT ppx (LD 3/11@0516 ).   HL <0.10 - subtherapeutic, no issues with running continuously per RN (did pause briefly during RSI earlier) or signs/symptoms of bleeding.   Goal of Therapy:  Heparin level 0.3-0.5 units/ml Monitor platelets by anticoagulation protocol: Yes   Plan:  Increase heparin to 1150 units/hr Heparin level in 8 hours Daily heparin level, CBC   Cedric Fishman, PharmD, BCPS, BCCCP Clinical Pharmacist

## 2023-11-20 NOTE — Progress Notes (Signed)
 Patient was transported from 4NP to 4N on BIPAP to be intubated without any complications.

## 2023-11-20 NOTE — Progress Notes (Signed)
 [facility] PROGRESS NOTE    Progress Note  Jenny White  ZOX:[id number] DOB: [birth year] DOA: 11/11/2023 PCP: Danella Penton, MD     Brief Narrative:   Jenny White is an 78 y.o. 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  In the ED was found to have a lactate of 4 which improved with after fluid resuscitation, CT scan of the abdomen pelvis show high-grade small bowel obstruction with a transition point in the right lower quadrant General Surgery was consulted and was taken emergently to the OR on 11/11/2022 for exploratory laparotomy with lysis of adhesion left open had a small bowel resection with ileostomy NG tube in place with low volumes.  Taken to the OR again and abdominal closure on 11/13/2022  Significant Events: 3/4: Presented to ED with abdominal pain, N/V. Elevated LA found to have SBO taken emergently to surgery for EX-LAP.  3/7 extubated reintubated 3/10 extubated again 11/18/2023 off BiPAP     Assessment/Plan:   High-grade small bowel obstruction with bowel ischemia and peritonitis: Status post exploratory laparotomy on 11/11/2022 with bowel resection end ileostomy and abdominal wall collection on 11/13/2022. Currently on TPN. Currently on IV meropenem. NG tube has been clamped.  Acute respiratory failure with hypercarbic /or aspiration pneumonia: Successfully extubated on 11/17/2023. Was given IV Lasix with good response. Continue on IV meropenem, for concerns related to aspiration, currently NPO. She is n.p.o., Ativan discontinued PCCM recommended BiPAP yesterday. This morning she is on BiPAP requiring FiO2 of 40% encephalopathic and breathing about 40 times per minute. We have contacted PCCM for possible intubation  Acute metabolic  encephalopathy: Medications discontinued, since yesterday. She continues to be encephalopathic this morning,ABG showed improvement in CO2. Question due to infectious etiology  Postop vasoplegia/septic shock: Off pressors on IV antibiotic IV meropenem.  Recurrent aspiration pneumonia: Currently on IV meropenem, will continue this treatment. Fever curve and leukocytosis are rising. Culture aspirate Aeromonas species and Hafnia alvei. Further management per PCCM  Recurrent atrial fibrillation: Currently on IV amiodarone and IV heparin. Currently rate controlled.  Acute kidney injury: Likely due to sepsis, Started on pressors and IV fluids creatinine has improved to baseline.  Severe protein caloric malnutrition/nutrition: Currently on TPN. She is NPO as she is at risk of aspiration.  Hypokalemia: Repeat to IV recheck in the morning.  Diabetes mellitus type 2: Continue sliding scale insulin.  Anxiety: Currently getting BuSpar Neurontin per tube. She did receive 1 dose of Zyprexa IV.   DVT prophylaxis: Heparin Family Communication:none Status is: Inpatient Remains inpatient appropriate because: Stepdown pending respiratory status    Code Status:     Code Status Orders  (From admission, onward)           Start     Ordered   11/11/23 1627  Full code  Continuous       Question:  By:  Answer:  Consent: discussion documented in EHR   11/11/23 1632           Code Status History     Date Active Date Inactive Code Status Order ID Comments User Context   06/20/2022 1336 06/21/2022 1858 Full Code [id number]  Tressie Stalker, MD Inpatient   06/28/2020 1343 06/29/2020 1859 Full Code [id number]  Patsey Berthold, NP Inpatient      Advance Directive Documentation  Flowsheet Row Most Recent Value  Type of Advance Directive Healthcare Power of Attorney, Living will  Pre-existing out of facility DNR order (yellow form or pink MOST form) --  "MOST" Form in Place?  --         IV Access:   Peripheral IV   Procedures and diagnostic studies:   DG Chest Port 1 View Result Date: 11/19/2023 CLINICAL DATA:  Pneumonia EXAM: PORTABLE CHEST 1 VIEW COMPARISON:  11/15/2023, 11/14/2023, 11/12/2023, 06/09/2023 FINDINGS: Interval extubation and removal of esophageal tube. Right upper extremity central venous catheter tip over the SVC. Probable trace right effusion. At least small left effusion with persistent dense left lung base opacity. Stable cardiomediastinal silhouette with aortic atherosclerosis. Clip or marker in the left suprahilar lung IMPRESSION: 1. Interval extubation and removal of esophageal tube. 2. Persistent dense left lung base opacity with at least small left effusion. Trace right effusion. Findings appear slightly improved compared with 11/15/2023. Electronically Signed   By: Jasmine Pang M.D.   On: 11/19/2023 16:19     Medical Consultants:   None.   Subjective:    Jenny White obtunded, nonverbal  Objective:    Vitals:   11/20/23 0130 11/20/23 0330 11/20/23 0444 11/20/23 0752  BP: (!) 109/43 (!) 113/45 (!) 112/41 (!) 104/40  Pulse: 87 89 83 85  Resp: (!) 26 (!) 28 (!) 28 (!) 34  Temp:  98.8 F (37.1 C)  99.5 F (37.5 C)  TempSrc:  Axillary  Axillary  SpO2: 99% 100% 96% 95%  Weight:  57.3 kg    Height:       SpO2: 95 % O2 Flow Rate (L/min): 4 L/min FiO2 (%): 40 %   Intake/Output Summary (Last 24 hours) at 11/20/2023 0823 Last data filed at 11/20/2023 0500 Gross per 24 hour  Intake 1713.79 ml  Output 1875 ml  Net -161.21 ml   Filed Weights   11/17/23 0600 11/18/23 0217 11/20/23 0330  Weight: 52.7 kg 49.6 kg 57.3 kg    Exam: General exam: Obtunded Respiratory system: Good air movement and clear to auscultation. Cardiovascular system: S1 & S2 heard, RRR. No JVD. Gastrointestinal system: Abdomen is nondistended, soft and nontender.  Central nervous system: Unresponsive Extremities: No pedal edema. Skin: No  rashes, lesions or ulcers Data Reviewed:    Labs: Basic Metabolic Panel: Recent Labs  Lab 11/17/23 0424 11/17/23 0817 11/17/23 1333 11/17/23 1604 11/18/23 0311 11/18/23 1130 11/19/23 0458 11/20/23 0504 11/20/23 0630  NA 131* 135   < > 134* 143 143 143  --  151*  K 4.2 3.6   < > 4.2 2.8* 4.4 3.0*  --  4.3  CL 92* 93*  --  91* 97* 97* 99  --  113*  CO2 27 29  --  29 33* 33* 33*  --  34*  GLUCOSE 490* 230*  --  590* 133* 160* 121*  --  111*  BUN 56* 56*  --  53* 59* 57* 60*  --  53*  CREATININE 1.49* 1.55*  --  1.44* 1.42* 1.50* 1.25*  --  0.91  CALCIUM 8.6* 8.6*  --  8.7* 9.1 10.7* 9.7  --  9.1  MG 2.0 1.8  --   --  2.1  --  1.9 2.7*  --   PHOS 3.5 2.9  --   --  2.9  --  3.5 5.6*  --    < > = values in this interval not displayed.   GFR Estimated Creatinine Clearance: 46.1  mL/min (by C-G formula based on SCr of 0.91 mg/dL). Liver Function Tests: Recent Labs  Lab 11/16/23 0419 11/17/23 0424 11/18/23 0311 11/19/23 0458 11/20/23 0630  AST 27 30 32 29 39  ALT 18 24 29 29  38  ALKPHOS 53 77 80 87 120  BILITOT 1.3* 0.7 0.6 0.6 0.3  PROT 4.9* 4.9* 5.5* 5.5* 4.9*  ALBUMIN 2.8* 2.6* 2.6* 2.5* 2.2*   No results for input(s): "LIPASE", "AMYLASE" in the last 168 hours. No results for input(s): "AMMONIA" in the last 168 hours. Coagulation profile No results for input(s): "INR", "PROTIME" in the last 168 hours. COVID-19 Labs  No results for input(s): "DDIMER", "FERRITIN", "LDH", "CRP" in the last 72 hours.  Lab Results  Component Value Date   SARSCOV2NAA NEGATIVE 06/26/2020    CBC: Recent Labs  Lab 11/16/23 0419 11/17/23 0424 11/17/23 1333 11/18/23 0311 11/19/23 0458 11/20/23 0504  WBC 9.7 12.9*  --  15.1* 15.3* 18.4*  HGB 8.0* 8.7* 7.8* 9.7* 10.5* 9.4*  HCT 24.2* 26.9* 23.0* 29.9* 32.5* 30.6*  MCV 91.7 93.4  --  91.2 91.8 99.0  PLT 68* 92*  --  173 285 389   Cardiac Enzymes: No results for input(s): "CKTOTAL", "CKMB", "CKMBINDEX", "TROPONINI" in the last 168  hours. BNP (last 3 results) No results for input(s): "PROBNP" in the last 8760 hours. CBG: Recent Labs  Lab 11/18/23 2353 11/19/23 0508 11/19/23 1151 11/19/23 2311 11/20/23 0604  GLUCAP 91 124* 138* 107* 92   D-Dimer: No results for input(s): "DDIMER" in the last 72 hours. Hgb A1c: No results for input(s): "HGBA1C" in the last 72 hours. Lipid Profile: No results for input(s): "CHOL", "HDL", "LDLCALC", "TRIG", "CHOLHDL", "LDLDIRECT" in the last 72 hours.  Thyroid function studies: No results for input(s): "TSH", "T4TOTAL", "T3FREE", "THYROIDAB" in the last 72 hours.  Invalid input(s): "FREET3" Anemia work up: No results for input(s): "VITAMINB12", "FOLATE", "FERRITIN", "TIBC", "IRON", "RETICCTPCT" in the last 72 hours. Sepsis Labs: Recent Labs  Lab 11/13/23 0728 11/13/23 1610 11/14/23 0954 11/15/23 1513 11/17/23 0424 11/18/23 0311 11/19/23 0458 11/20/23 0504  WBC  --    < >  --    < > 12.9* 15.1* 15.3* 18.4*  LATICACIDVEN 2.6*  --  1.3  --   --   --   --   --    < > = values in this interval not displayed.   Microbiology Recent Results (from the past 240 hours)  MRSA Next Gen by PCR, Nasal     Status: None   Collection Time: 11/11/23  5:01 PM   Specimen: Nasal Mucosa; Nasal Swab  Result Value Ref Range Status   MRSA by PCR Next Gen NOT DETECTED NOT DETECTED Final    Comment: (NOTE) The GeneXpert MRSA Assay (FDA approved for NASAL specimens only), is one component of a comprehensive MRSA colonization surveillance program. It is not intended to diagnose MRSA infection nor to guide or monitor treatment for MRSA infections. Test performance is not FDA approved in patients less than 27 years old. Performed at [facility], [address] [place], [place] [address]   Blood culture (routine x 2)     Status: None   Collection Time: 11/11/23  5:18 PM   Specimen: BLOOD RIGHT ARM  Result Value Ref Range Status   Specimen Description BLOOD RIGHT ARM  Final    Special Requests   Final    BOTTLES DRAWN AEROBIC AND ANAEROBIC Blood Culture results may not be optimal due to  an inadequate volume of blood received in culture bottles   Culture   Final    NO GROWTH 5 DAYS Performed at [facility], [address], [place], [place] [address]    Report Status 11/16/2023 FINAL  Final  Blood culture (routine x 2)     Status: None   Collection Time: 11/11/23  5:18 PM   Specimen: BLOOD RIGHT ARM  Result Value Ref Range Status   Specimen Description BLOOD RIGHT ARM  Final   Special Requests   Final    BOTTLES DRAWN AEROBIC AND ANAEROBIC Blood Culture results may not be optimal due to an inadequate volume of blood received in culture bottles   Culture   Final    NO GROWTH 5 DAYS Performed at [facility], [address], [place], [place] [address]    Report Status 11/16/2023 FINAL  Final  Culture, Respiratory w Gram Stain     Status: None   Collection Time: 11/16/23 12:21 PM   Specimen: Tracheal Aspirate; Respiratory  Result Value Ref Range Status   Specimen Description TRACHEAL ASPIRATE  Final   Special Requests NONE  Final   Gram Stain   Final    FEW WBC PRESENT, PREDOMINANTLY PMN FEW GRAM NEGATIVE RODS Performed at [facility], [address], [place], [place] [address]    Culture   Final    ABUNDANT AEROMONAS SPECIES ABUNDANT HAFNIA ALVEI    Report Status 11/19/2023 FINAL  Final   Organism ID, Bacteria AEROMONAS SPECIES  Final   Organism ID, Bacteria HAFNIA ALVEI  Final      Susceptibility   Aeromonas species - MIC*    CEFTAZIDIME <=1 SENSITIVE Sensitive     CEFTRIAXONE <=0.25 SENSITIVE Sensitive     CIPROFLOXACIN <=0.25 SENSITIVE Sensitive     GENTAMICIN <=1 SENSITIVE Sensitive     IMIPENEM <=0.25 SENSITIVE Sensitive     TRIMETH/SULFA <=20 SENSITIVE Sensitive     PIP/TAZO 16 SENSITIVE Sensitive ug/mL    * ABUNDANT AEROMONAS SPECIES   Hafnia alvei - MIC*    AMPICILLIN >=32 RESISTANT Resistant     CEFTAZIDIME 16  INTERMEDIATE Intermediate     CEFTRIAXONE 32 RESISTANT Resistant     CIPROFLOXACIN <=0.25 SENSITIVE Sensitive     GENTAMICIN 4 SENSITIVE Sensitive     IMIPENEM 1 SENSITIVE Sensitive     TRIMETH/SULFA <=20 SENSITIVE Sensitive     AMPICILLIN/SULBACTAM >=32 RESISTANT Resistant     PIP/TAZO >=128 RESISTANT Resistant ug/mL    * ABUNDANT HAFNIA ALVEI     Medications:    arformoterol  15 mcg Nebulization BID   budesonide (PULMICORT) nebulizer solution  0.5 mg Nebulization BID   Chlorhexidine Gluconate Cloth  6 each Topical Daily   gabapentin  300 mg Per Tube QHS   montelukast  10 mg Per Tube QHS   nicotine  21 mg Transdermal Daily   pantoprazole (PROTONIX) IV  40 mg Intravenous Daily   revefenacin  175 mcg Nebulization Daily   sodium chloride flush  10-40 mL Intracatheter Q12H   Continuous Infusions:  amiodarone 30 mg/hr (11/19/23 2149)   heparin 1,000 Units/hr (11/20/23 0204)   meropenem (MERREM) IV 1 g (11/19/23 2142)   TPN ADULT (ION) 55 mL/hr at 11/19/23 1820      LOS: 9 days   Marinda Elk  [facility]  11/20/2023, 8:23 AM

## 2023-11-20 NOTE — Progress Notes (Signed)
 NAME:  Jenny White, MRN:  [id number], DOB:  09/06/1946, LOS: 9 ADMISSION DATE:  11/11/2023 CONSULTATION DATE:  11/11/2023 REFERRING MD:  Dwain Sarna - [facility]CS CHIEF COMPLAINT: Shock  History of Present Illness:  Jenny White 78 year old woman with a pmhx significant for COPD, GERD, nephrolithiasis , HTN, OSA, Lung cancer ( Adenocarcinoma, diagnosed 05/2023) who presented to the emergency room with complaints of abdominal pain with N/V for the past week. She states the abdominal  pain got worsened after dinner. She reported experiencing nausea and  vomiting this morning.   In the ED  her labs were significant for a lactate 4.1 After fluid resuscitation, Lactate decreased to 2.5. CT scan showed high grade small bowel obstruction with transition point in the right lower quadrant along the distal ileum. Some trace of ascites and mesenteric stranding.Concern for bowel wall ischemia. Broad spectrum antibiotics ( zosyn) was started, blood cultures pending.   Patient was taken to the ER emergently with CCS 3/4 for Ex-lap / LOA. Intraoperative course  was notable for free fluid on entry, with closed loop bowel obstruction noted secondary to tight band adhesion. Patient left in discontinuity/ anastomosis not completed with open abdomen and plan for second look in 24-48 hrs. Patient remained intubated and was transferred to ICU in critical condition.   Pertinent Medical History:   Past Medical History:  Diagnosis Date   Arthritis    in back   COPD (chronic obstructive pulmonary disease) (HCC)    Difficult airway    Due to limited oral opening. Elective glidescope used previously.   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 Elevated LA found to have SBO taken emergently to surgery for EX-LAP.  3/7 extubated reintubated 3/10 extubated again 3/11 OFF NIPPV 3/12 got ativan at HS. Sleepy and difficult to arouse all day. Stat ABG. Stopping ativan  3/13 transferred to ICU for obtundation. Re-intubated.   Interim History / Subjective:  Called emergently to bedside. Unresponsive Transferred to ICU for intubation  Objective:  Blood pressure (!) 104/40, pulse 85, temperature 99.5 F (37.5 C), temperature source Axillary, resp. rate (!) 34, height 5\' 7"  (1.702 m), weight 57.3 kg, SpO2 95%.    Vent Mode: BIPAP;PCV FiO2 (%):  [40 %] 40 % Set Rate:  [12 bmp] 12 bmp PEEP:  [5 cmH20] 5 cmH20 Pressure Support:  [5 cmH20-10 cmH20] 10 cmH20   Intake/Output Summary (Last 24 hours) at 11/20/2023 0926 Last data filed at 11/20/2023 0500 Gross per 24 hour  Intake 1713.79 ml  Output 1875 ml  Net -161.21 ml   Filed Weights   11/17/23 0600 11/18/23 0217 11/20/23 0330  Weight: 52.7 kg 49.6 kg 57.3 kg   Physical Examination: General this is a 78 year old female who is laying in bed. Eyes closed. Mouth open. Sp fairly slurred and dysarthric. Required assist w/ oral sxn earlier for sputum  HENT no JVD MM dry.  Pulm scattered rhonchi. Decreased on right. Currently on 6 lpm sats 94% resp pattern not really labored Card rrr Abd  soft Ext no sig edema  Neuro sedated. Follows commands. Oriented x 3 but very slow to respond. Can't open eyes. Sp difficult to understand  Resolved   Postop vasoplegia/ septic shock  Assessment & Plan:    Pulm problem list  Acute on chronic resp failure with hypoxia, hypercarbia Aspiration pneumonia Aeromonas and Hafnia Alvei pneumonia > both S to meropenmem Chronic COPD advanced Pulmonary edema was a concern previously, but seems to be euvolemic at this point.  -STAT intubation -Full  vent support -ABG in 1 hour/CXR now post tube.  -BAL pending -WUA/SBT tomorrow -Third intubation. Doesn't sound like family would want trach. Hopefully a quick turnaround -Triple neb therapy -Meropenem continue day 4  Shock: septic from PNA vs vasodilatory following intubation/acidosis. Echo 3/6 with EF 60%, grade 1 DD. Mildly reduced RV function.  - levo/vaso for MAP goal 65 - IVF - Stress steroids - Check ABG  Acute metabolic encephalopathy: etiology has not been well defined. Some concern from ativan which was stopped yesterday. Hypercarbia wasn't bad yesterday, but I worry with repeat aspiration she may have gotten progressively hypoxic and hypercarbic overnight.   - Ativan DC, doesn't tolerate - Fentanyl and dex for RASS goal 0 to -1 - check ammonia, TSH again - WUA in AM - CT head when more stable  High grade small bowel obstruction s/p ex lap 3/4 with resection and end ileostomy - Management per surgery - Cortrak with plans for post pyloric trickles probably tomorrow - no longer needs ABX for intraabdominal coverage  Atrial fib - continue amio - Heparin per pharmacy.   AKI: improved - trend chemistry  Protein calorie malnutrition - TPN - Plans to tentatively start TF tomorrow with Cortrak. Per surgery.   DM2 - SSI  RLL Cancer s/p SBRT - supporitve  GOC: - discussed with son prior to intubation. The patient was minimally responsive and required intubation for airway protection. If no intubation then would need to pursue palliation. He elected for short term intubation hoping this is a reversible process. Does not sound like they would want trach.   Best Practice: (right click and "Reselect all SmartList Selections" daily)   Diet/type: TPN DVT prophylaxis: heparin gtt GI prophylaxis: PPI Lines: PICC Foley:  Yes, and it is still needed Code Status:  full code Last date of multidisciplinary goals of care discussion:   Critical care time 59 mintues   Joneen Roach, AGACNP-BC [facility]  See Amion for personal pager PCCM on call pager [phone] until 7pm. Please call Elink 7p-7a. [phone]  11/20/2023 9:48 AM

## 2023-11-20 NOTE — Progress Notes (Signed)
 PHARMACY - ANTICOAGULATION CONSULT NOTE  Pharmacy Consult for heparin Indication: atrial fibrillation  No Known Allergies  Patient Measurements: Height: 5\' 7"  (170.2 cm) Weight: 49.6 kg (109 lb 5.6 oz) IBW/kg (Calculated) : 61.6 Heparin Dosing Weight: 49.6 kg   Vital Signs: Temp: 98.2 F (36.8 C) (03/12 2312) Temp Source: Axillary (03/12 2312) BP: 109/43 (03/13 0130) Pulse Rate: 87 (03/13 0130)  Labs: Recent Labs    11/17/23 0424 11/17/23 0817 11/17/23 1333 11/17/23 1604 11/18/23 0311 11/18/23 1130 11/18/23 1713 11/19/23 0458 11/19/23 1400 11/20/23 0100  HGB 8.7*  --  7.8*  --  9.7*  --   --  10.5*  --   --   HCT 26.9*  --  23.0*  --  29.9*  --   --  32.5*  --   --   PLT 92*  --   --   --  173  --   --  285  --   --   HEPARINUNFRC  --   --   --   --   --   --    < > <0.10* <0.10* 0.10*  CREATININE 1.49*   < >  --    < > 1.42* 1.50*  --  1.25*  --   --    < > = values in this interval not displayed.    Estimated Creatinine Clearance: 29 mL/min (A) (by C-G formula based on SCr of 1.25 mg/dL (H)).  Assessment: 19 yof presenting with abdominal pain now s/p ex-lap small bowel resection followed by small bowel resection, ileostomy and closure. Has been going in/out of Afib (no AC PTA). Was on subQ heparin for DVT ppx (LD 3/11@0516 ).   HL 0.10 - subtherapeutic, no issues with running continuously per RN or signs/symptoms of bleeding  Goal of Therapy:  Heparin level 0.3-0.5 units/ml Monitor platelets by anticoagulation protocol: Yes   Plan:  Increase heparin to 1000 units/hr Heparin level in 8 hours Daily heparin level, CBC   Arabella Merles, PharmD. Clinical Pharmacist 11/20/2023 2:02 AM

## 2023-11-20 NOTE — Procedures (Signed)
 Bedside Bronchoscopy Procedure Note Jenny White [id number] 1946-06-12  Procedure: Bronchoscopy Indications: Diagnostic evaluation of the airways, Obtain specimens for culture and/or other diagnostic studies, and Remove secretions  Procedure Details: ET Tube Size: ET Tube secured at lip (cm): Bite block in place: Yes In preparation for procedure, Patient hyper-oxygenated with 100 % FiO2 Airway entered and the following bronchi were examined: RUL, RML, RLL, LUL, LLL, and Bronchi.   Bronchoscope removed.  , Patient placed back on 100% FiO2 at conclusion of procedure.    Evaluation BP (!) 133/53   Pulse 70   Temp 99.5 F (37.5 C) (Axillary)   Resp (!) 33   Ht 5\' 7"  (1.702 m)   Wt 57.3 kg   SpO2 99%   BMI 19.79 kg/m  Breath Sounds:Diminished O2 sats: stable throughout Patient's Current Condition: stable Specimens:  Walked down to the lab, thick tan / clear.  Complications: No apparent complications Patient did tolerate procedure well. RT assisted Dr. Katrinka Blazing with bronchoscopy.    Jenny White, Margaretmary Dys 11/20/2023, 9:50 AM

## 2023-11-20 NOTE — Progress Notes (Signed)
 Patient transported to CT and back to room 4N27, on ventilator, without complications.

## 2023-11-20 NOTE — Procedures (Signed)
 Bronchoscopy Procedure Note  Jenny White  [id number]  Mar 12, 1946  Date:11/20/23  Time:9:58 AM   Provider Performing:Shashana Fullington C Katrinka Blazing   Procedure(s):  Flexible bronchoscopy with bronchial alveolar lavage [phone]) and Subsequent Therapeutic Aspiration of Tracheobronchial Tree [phone])  Indication(s) Mucus plugging  Consent Verbal daugher  Anesthesia In place for intubation   Time Out Verified patient identification, verified procedure, site/side was marked, verified correct patient position, special equipment/implants available, medications/allergies/relevant history reviewed, required imaging and test results available.   Sterile Technique Usual hand hygiene, masks, gowns, and gloves were used   Procedure Description Bronchoscope advanced through endotracheal tube and into airway.  Airways were examined down to subsegmental level.  Thick inspissated white mucus occluding right mainstem suctioned.  Beyond this plug there was mostly basilar mucus plugging suctioned out pretty well.  BAL done in RLL with clear fluid plus more plugs.  No immediate complications.  Complications/Tolerance None; patient tolerated the procedure well. Chest X-ray is not needed post procedure.   EBL Minimal   Specimen(s) RLL BAL

## 2023-11-20 NOTE — Progress Notes (Signed)
 PHARMACY - TOTAL PARENTERAL NUTRITION CONSULT NOTE   Indication:  Ischemic bowel s/p small bowel resection, end ileostomy  Patient Measurements: Height: 5\' 7"  (170.2 cm) Weight: 57.3 kg (126 lb 5.2 oz) IBW/kg (Calculated) : 61.6 TPN AdjBW (KG): 54.4 Body mass index is 19.79 kg/m. Usual Weight: 58 kg   Assessment:  78 year old woman with hx lung adenocarcinoma, prior splenectomy among others presented to the emergency room with complaints of abdominal pain with N/V for the past week. She states the abdominal pain got worsened after dinner. She reported experiencing nausea and vomiting this morning. CT scan showed high grade small bowel obstruction with transition point in the right lower quadrant along the distal ileum. Some trace of ascites and mesenteric stranding. Concern for bowel wall ischemia. Broad spectrum antibiotics (zosyn) was started, blood cultures pending. Remains on pressor support and enteral nutrition now on hold. Pharmacy consulted to initiate TPN.   3/10 BMP glucose and finger sticks are significantly different. Patient does not have hx of diabetes so would not expect BG to be in the 400s on TPN. Repeat labs 3/10 after holding TPN for 5 min and flushing well shows improvement in BG and now matching POC glucose. Labs with high glucose were likely contaminated with TPN.  Glucose / Insulin: A1c 5.8%, CBGs <140, off insulin Electrolytes: Na 151, K 4.3 (received 60 IV, off furosemide), CL 113, CO2 34, iCa 1.3/CoCa 10.5 (none in TPN), Mg 2.7, Phos 5.6, others wnl Renal:  Scr 0.91 down (bsl ~1), BUN 53 Hepatic: alb 2.5, Alk phos/AST/ALT/Tbili wnl, TG 75 Intake / Output; MIVF:  UOP 1.2 mL/kg/hr, ostomy 200 mL  GI Imaging:  3/4 CT - high-grade SBO is with transition point in the RLQ along the distal ileum, cannot exclude bowel wall ischemia; distended esophagus 3/4 KUB: Dilated fluid-filled loops of bowel  GI Surgeries / Procedures:  3/4 ex lap, small bowel resection, open abdomen  with bowel in discontinuity  3/6 small bowel resection, end ileostomy, closure of abdomen  Central access: PICC 11/12/23 TPN start date: 11/14/2023  Nutritional Goals: Goal TPN rate is 55 mL/hr (provides 86 g of protein and 1653 kcals per day)  RD Assessment: Estimated Needs Total Energy Estimated Needs: 1650-1850 kcals Total Protein Estimated Needs: 85-105 g Total Fluid Estimated Needs: >/= 1.7 L  Current Nutrition:  NPO + TPN  Plan:  Suspect 6am labs may be contaminated with TPN based on unexpected bump in Cl/Phos/mag. Ordered repeat labs but delayed d/t patient transfer to ICU and will not result by TPN order due time. Noted off furosemide so may be accurate. Will adjust in case real and repeat labs tomorrow.  Continue TPN at goal 50 mL/hr at 1800 to meet 100% goal kcal and AA (decreased rate given less volume needed with removal of lytes, but still meeting goal) Electrolytes in TPN: decrease Na 0 mEq/L, decrease K 30 mEq/L, Ca 0 mEq/L, decrease Mg 0 mEq/L, decrease Phos 0 mmol/L. Cl:Ac max Cl (unable to do other d/t low lytes) Add standard MVI and trace elements to TPN Monitor TPN labs on Mon/Thurs, daily as needed   Thank you for allowing pharmacy to be a part of this patient's care.  Alphia Moh, PharmD, BCPS, BCCP Clinical Pharmacist  Please check AMION for all [facility] Pharmacy phone numbers After 10:00 PM, call Main Pharmacy [phone]

## 2023-11-20 NOTE — Progress Notes (Signed)
 RT called to perform NTS, as pt has audible "gurgle" of secretions that she is unable to bring up herself.  RT was able to suction copious amounts of white/yellow secretions.  Pt WOB and Breath sounds both markedly improved after suctioning.  RT will continue to monitor

## 2023-11-20 NOTE — Procedures (Signed)
 Patient Name: Jenny White  MRN: [id number]  Epilepsy Attending: Charlsie Quest  Referring Physician/Provider: Duayne Cal, NP  Date: 11/20/2023 Duration: 25.53 mins  Patient history: 78yo F noted have facial twitching after intubation. EEG to evaluate for seizure  Level of alertness: comatose  AEDs during EEG study: GBP, versed  Technical aspects: This EEG study was done with scalp electrodes positioned according to the 10-20 International system of electrode placement. Electrical activity was reviewed with band pass filter of 1-70Hz , sensitivity of 7 uV/mm, display speed of 74mm/sec with a 60Hz  notched filter applied as appropriate. EEG data were recorded continuously and digitally stored.  Video monitoring was available and reviewed as appropriate.  Description: EEG showed continuous generalized 3 to 6 Hz theta-delta slowing admixed with 12-14hz  beta activity. Hyperventilation and photic stimulation were not performed.     ABNORMALITY - Continuous slow, generalized  IMPRESSION: This study is suggestive of severe diffuse encephalopathy. No seizures or epileptiform discharges were seen throughout the recording.  Jenny White

## 2023-11-20 NOTE — Progress Notes (Signed)
 eLink Physician-Brief Progress Note Patient Name: Jenny White DOB: 08-Nov-1945 MRN: [id number]   Date of Service  11/20/2023  HPI/Events of Note  Patient is on the ventilator  and reaching for the ET tube.  eICU Interventions  Bilateral soft wrist restraints ordered.        Migdalia Dk 11/20/2023, 9:28 PM

## 2023-11-20 NOTE — Progress Notes (Signed)
 EEG complete - results pending

## 2023-11-21 ENCOUNTER — Other Ambulatory Visit (HOSPITAL_COMMUNITY): Payer: Self-pay

## 2023-11-21 ENCOUNTER — Inpatient Hospital Stay (HOSPITAL_COMMUNITY)

## 2023-11-21 ENCOUNTER — Telehealth (HOSPITAL_COMMUNITY): Payer: Self-pay | Admitting: Pharmacy Technician

## 2023-11-21 DIAGNOSIS — R579 Shock, unspecified: Secondary | ICD-10-CM | POA: Diagnosis not present

## 2023-11-21 DIAGNOSIS — J9621 Acute and chronic respiratory failure with hypoxia: Secondary | ICD-10-CM | POA: Diagnosis not present

## 2023-11-21 DIAGNOSIS — J189 Pneumonia, unspecified organism: Secondary | ICD-10-CM | POA: Diagnosis not present

## 2023-11-21 DIAGNOSIS — J9622 Acute and chronic respiratory failure with hypercapnia: Secondary | ICD-10-CM | POA: Diagnosis not present

## 2023-11-21 DIAGNOSIS — J9601 Acute respiratory failure with hypoxia: Secondary | ICD-10-CM

## 2023-11-21 LAB — GLUCOSE, CAPILLARY
Glucose-Capillary: 117 mg/dL — ABNORMAL HIGH (ref 70–99)
Glucose-Capillary: 122 mg/dL — ABNORMAL HIGH (ref 70–99)
Glucose-Capillary: 123 mg/dL — ABNORMAL HIGH (ref 70–99)
Glucose-Capillary: 132 mg/dL — ABNORMAL HIGH (ref 70–99)
Glucose-Capillary: 148 mg/dL — ABNORMAL HIGH (ref 70–99)
Glucose-Capillary: 157 mg/dL — ABNORMAL HIGH (ref 70–99)
Glucose-Capillary: 159 mg/dL — ABNORMAL HIGH (ref 70–99)

## 2023-11-21 LAB — TSH: TSH: 2.575 u[IU]/mL (ref 0.350–4.500)

## 2023-11-21 LAB — BASIC METABOLIC PANEL
Anion gap: 10 (ref 5–15)
BUN: 50 mg/dL — ABNORMAL HIGH (ref 8–23)
CO2: 26 mmol/L (ref 22–32)
Calcium: 9.1 mg/dL (ref 8.9–10.3)
Chloride: 112 mmol/L — ABNORMAL HIGH (ref 98–111)
Creatinine, Ser: 0.98 mg/dL (ref 0.44–1.00)
GFR, Estimated: 59 mL/min — ABNORMAL LOW (ref >60)
Glucose, Bld: 162 mg/dL — ABNORMAL HIGH (ref 70–99)
Potassium: 4.3 mmol/L (ref 3.5–5.1)
Sodium: 148 mmol/L — ABNORMAL HIGH (ref 135–145)

## 2023-11-21 LAB — CBC
HCT: 32.7 % — ABNORMAL LOW (ref 36.0–46.0)
Hemoglobin: 10 g/dL — ABNORMAL LOW (ref 12.0–15.0)
MCH: 29.5 pg (ref 26.0–34.0)
MCHC: 30.6 g/dL (ref 30.0–36.0)
MCV: 96.5 fL (ref 80.0–100.0)
Platelets: 464 10*3/uL — ABNORMAL HIGH (ref 150–400)
RBC: 3.39 MIL/uL — ABNORMAL LOW (ref 3.87–5.11)
RDW: 18.8 % — ABNORMAL HIGH (ref 11.5–15.5)
WBC: 25.5 10*3/uL — ABNORMAL HIGH (ref 4.0–10.5)
nRBC: 0.4 % — ABNORMAL HIGH (ref 0.0–0.2)

## 2023-11-21 LAB — PHOSPHORUS: Phosphorus: 2.3 mg/dL — ABNORMAL LOW (ref 2.5–4.6)

## 2023-11-21 LAB — HEPARIN LEVEL (UNFRACTIONATED): Heparin Unfractionated: 0.46 [IU]/mL (ref 0.30–0.70)

## 2023-11-21 LAB — MAGNESIUM: Magnesium: 2.1 mg/dL (ref 1.7–2.4)

## 2023-11-21 LAB — AMMONIA: Ammonia: 29 umol/L (ref 9–35)

## 2023-11-21 MED ORDER — TRACE MINERALS CU-MN-SE-ZN 300-55-60-3000 MCG/ML IV SOLN
INTRAVENOUS | Status: AC
  Filled 2023-11-21: qty 576

## 2023-11-21 MED ORDER — PROSOURCE TF20 ENFIT COMPATIBL EN LIQD: 60.0000 mL | Freq: Every day | ENTERAL | Status: DC

## 2023-11-21 MED ORDER — ORAL CARE MOUTH RINSE
15.0000 mL | OROMUCOSAL | Status: DC
  Administered 2023-11-21 – 2023-12-11 (×242): 15 mL via OROMUCOSAL

## 2023-11-21 MED ORDER — INSULIN ASPART 100 UNIT/ML IJ SOLN
0.0000 [IU] | Freq: Four times a day (QID) | INTRAMUSCULAR | Status: DC
  Administered 2023-11-21 (×2): 1 [IU] via SUBCUTANEOUS
  Administered 2023-11-21: 2 [IU] via SUBCUTANEOUS
  Administered 2023-11-22 (×2): 1 [IU] via SUBCUTANEOUS
  Administered 2023-11-23: 2 [IU] via SUBCUTANEOUS
  Administered 2023-11-23 – 2023-11-24 (×4): 1 [IU] via SUBCUTANEOUS

## 2023-11-21 MED ORDER — METOCLOPRAMIDE HCL 5 MG/ML IJ SOLN
20.0000 mg | Freq: Once | INTRAVENOUS | Status: AC
  Administered 2023-11-21: 20 mg via INTRAVENOUS
  Filled 2023-11-21: qty 4

## 2023-11-21 MED ORDER — VITAL 1.5 CAL PO LIQD: 1000.0000 mL | ORAL | Status: DC

## 2023-11-21 MED ORDER — LEVALBUTEROL HCL 0.63 MG/3ML IN NEBU
0.6300 mg | INHALATION_SOLUTION | RESPIRATORY_TRACT | Status: DC | PRN
  Administered 2023-11-28 – 2023-12-09 (×2): 0.63 mg via RESPIRATORY_TRACT
  Filled 2023-11-21 (×3): qty 3

## 2023-11-21 MED ORDER — POTASSIUM PHOSPHATES 15 MMOLE/5ML IV SOLN
10.0000 mmol | Freq: Once | INTRAVENOUS | Status: AC
  Administered 2023-11-21: 10 mmol via INTRAVENOUS
  Filled 2023-11-21: qty 3.33

## 2023-11-21 MED ORDER — LACTATED RINGERS IV SOLN: INTRAVENOUS | Status: AC

## 2023-11-21 MED ORDER — MIDAZOLAM HCL 2 MG/2ML IJ SOLN
1.0000 mg | Freq: Once | INTRAMUSCULAR | Status: AC | PRN
  Administered 2023-11-21: 2 mg via INTRAVENOUS
  Filled 2023-11-21: qty 2

## 2023-11-21 MED ORDER — FENTANYL BOLUS VIA INFUSION
25.0000 ug | INTRAVENOUS | Status: DC | PRN
  Administered 2023-11-21: 100 ug via INTRAVENOUS

## 2023-11-21 MED ORDER — ORAL CARE MOUTH RINSE: 15.0000 mL | OROMUCOSAL | Status: DC | PRN

## 2023-11-21 NOTE — Progress Notes (Signed)
 eLink Physician-Brief Progress Note Patient Name: Jenny White DOB: 1945-10-19 MRN: [id number]   Date of Service  11/21/2023  HPI/Events of Note   78 year old woman who was intubated for respiratory distress yesterday for AMS as increased O2 requirements.  She may have developed HCAP or possibly aspirated.  She has generalized weakness and has pre-existing COPD, OSA and adenocarcinoma of the lung.   Intubated and at risk of pulling lines/tubes  eICU Interventions  Renew bilateral wrist restraints as needed     Intervention Category Minor Interventions: Agitation / anxiety - evaluation and management  German Manke 11/21/2023, 10:48 PM

## 2023-11-21 NOTE — Progress Notes (Signed)
 NAME:  Jenny White, MRN:  [id number], DOB:  [birth year], LOS: 10 ADMISSION DATE:  11/11/2023 CONSULTATION DATE:  11/11/2023 REFERRING MD:  Dwain Sarna - CCS CHIEF COMPLAINT: Shock  History of Present Illness:  Jenny White 78 year old woman with a pmhx significant for COPD, GERD, nephrolithiasis , HTN, OSA, Lung cancer ( Adenocarcinoma, diagnosed 05/2023) who presented to the emergency room with complaints of abdominal pain with N/V for the past week. She states the abdominal  pain got worsened after dinner. She reported experiencing nausea and  vomiting this morning.   In the ED  her labs were significant for a lactate 4.1 After fluid resuscitation, Lactate decreased to 2.5. CT scan showed high grade small bowel obstruction with transition point in the right lower quadrant along the distal ileum. Some trace of ascites and mesenteric stranding.Concern for bowel wall ischemia. Broad spectrum antibiotics ( zosyn) was started, blood cultures pending.   Patient was taken to the ER emergently with CCS 3/4 for Ex-lap / LOA. Intraoperative course  was notable for free fluid on entry, with closed loop bowel obstruction noted secondary to tight band adhesion. Patient left in discontinuity/ anastomosis not completed with open abdomen and plan for second look in 24-48 hrs. Patient remained intubated and was transferred to ICU in critical condition.   Pertinent Medical History:   Past Medical History:  Diagnosis Date   Arthritis    in back   COPD (chronic obstructive pulmonary disease) (HCC)    Difficult airway    Due to limited oral opening. Elective glidescope used previously.   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 Elevated LA found to have SBO taken emergently to surgery for EX-LAP.  3/7 extubated reintubated 3/10 extubated again 3/11 OFF NIPPV 3/12 got ativan at HS. Sleepy and difficult to arouse all day. Stat ABG. Stopping ativan  3/13 transferred to ICU for obtundation. Re-intubated.   Interim History / Subjective:  No acute events overnight PSV 8/8 this morning with rapid rate and low volumes, flipped back to full support. No c/o  Objective:  Blood pressure (!) 111/47, pulse 67, temperature 99 F (37.2 C), temperature source Axillary, resp. rate (!) 23, height 5' 7.01" (1.702 m), weight 55.8 kg, SpO2 96%.    Vent Mode: PRVC FiO2 (%):  [40 %-100 %] 40 % Set Rate:  [26 bmp] 26 bmp Vt Set:  [440 mL-490 mL] 490 mL PEEP:  [8 cmH20] 8 cmH20 Pressure Support:  [8 cmH20] 8 cmH20 Plateau Pressure:  [20 cmH20-24 cmH20] 22 cmH20   Intake/Output Summary (Last 24 hours) at 11/21/2023 0827 Last data filed at 11/21/2023 0800 Gross per 24 hour  Intake 3742.78 ml  Output 2050 ml  Net 1692.78 ml   Filed Weights   11/18/23 0217 11/20/23 0330 11/21/23 0430  Weight: 49.6 kg 57.3 kg 55.8 kg   Physical Examination: General elderly female in NAD on vent HENT [place]/AT, PERRL, no JVD Pulm Scattered rhonchi, Wheeze R>L Card RRR, no MRG Abd Soft, ND, ND Ext no sig edema  Neuro: Spontaneously awake, alert, follows commands and cooperated with exam.   Labs  Na 148 K 4.3 BUN 50 Phos 2.3  WBC 25.5  Hgb 10 Plt 464  CXR: stable LLL consolidation   Resolved   Postop vasoplegia/ septic shock  Assessment & Plan:    Pulm problem list  Acute on chronic resp failure with hypoxia, hypercarbia HAP:  Aeromonas and Hafnia Alvei pneumonia > both S to meropenmem Likely a component of aspiration as well.  Chronic COPD advanced Pulmonary edema was a concern previously, but seems to be euvolemic at  this point.  -STAT intubation -Full vent support -ABG in 1 hour/CXR now post tube.  -BAL pending -WUA/SBT tomorrow -Third intubation. Doesn't sound like family would want trach. Hopefully a quick turnaround -Triple neb therapy. PRN levalbuterol -Meropenem continue day 5. Inhaled tobramycin day 2  Shock: septic from PNA vs vasodilatory following intubation/acidosis. Echo 3/6 with EF 60%, grade 1 DD. Mildly reduced RV function.  - levo/vaso for MAP goal 65 > weaning - Stress steroids - Fluids  Acute metabolic encephalopathy: etiology has not been well defined. Some concern from ativan which was stopped yesterday. Hypercarbia wasn't bad yesterday, but I worry with repeat aspiration she may have gotten progressively hypoxic and hypercarbic overnight.   - Ativan DC, doesn't tolerate - Fentanyl and dex for RASS goal 0 to -1 - CT head when more stable  High grade small bowel obstruction s/p ex lap 3/4 with resection and end ileostomy - Management per surgery - Cortrak with plans for post pyloric trickles probably tomorrow - no longer needs ABX for intraabdominal coverage  Atrial fib - continue amio - Heparin per pharmacy.   AKI: improved - trend chemistry - MIVF while NPO  Protein calorie malnutrition - TPN - Plans to tentatively start TF tomorrow with Cortrak. Per surgery.   DM2 - SSI  RLL Cancer s/p SBRT - supporitve  GOC: - discussed with son prior to intubation. The patient was minimally responsive and required intubation for airway protection. If no intubation then would need to pursue palliation. He elected for short term intubation hoping this is a reversible process. Does not sound like they would want trach.   Best Practice: (right click and "Reselect all SmartList Selections" daily)   Diet/type: TPN DVT prophylaxis: heparin gtt GI prophylaxis: PPI Lines: PICC Foley:  Yes, and it is still needed Code Status:  full code Last date of multidisciplinary goals of  care discussion:   Critical care time 43 mintues   Jenny White, AGACNP-BC [facility]  See Amion for personal pager PCCM on call pager [phone] until 7pm. Please call Elink 7p-7a. [phone]  11/21/2023 8:27 AM

## 2023-11-21 NOTE — Progress Notes (Addendum)
 Nutrition Follow-up  DOCUMENTATION CODES:   Severe malnutrition in context of chronic illness (COPD and lung cancer)  INTERVENTION:  Continued TPN administration to meet calorie and protein needs  If EN can be initiated via Cortrak tube, recommend:  Initiate tube feeding via Cortrak: initiate tube feed at 36ml/h and increase by 10ml every 8 hours   Vital 1.5 at 50 ml/h (1200 ml per day)  Prosource TF20 60 ml 1x/day  Provides 1880 kcal, 101 gm protein, 916 ml free water daily  Recommend monitor potassium, magnesium and phosphorus labs daily until stable and replete as needed  NUTRITION DIAGNOSIS:   Severe Malnutrition related to chronic illness (COPD and lung cancer) as evidenced by severe muscle depletion, severe fat depletion.  GOAL:  Patient will meet greater than or equal to 90% of their needs Met via TPN  MONITOR:  Vent status, Labs, TF tolerance, I & O's  REASON FOR ASSESSMENT:  Consult Enteral/tube feeding initiation and management  ASSESSMENT:  79 yo female admitted with mixed shock, septic and hypovolemic, high grade SBO with bowel ischemia. PMH includes COPD, GERD, nephrolithiasis, HTN, OSA, lung cancer (adenocarcinoma RLL dx 05/2023)  3/04 OR: Ex Lap, SB resection, Abthera wound VAC placement, open abdomen with bowel in discontinuity  3/05 PICC line placed 3/06 Return to OR: Re-opening of recent ex lap, SB resection with end ileostomy, abd wall closure and wound VAC remove 3/07 TPN initiated, Extubated, Re-Intubated 3/10 Extubated  3/11 off BiPAP 3/13 re-intubated, transferred to 4N ICU 3/14 Cortrak placed, currently gastric  Pt discussed during ICU rounds and with RN and MD.  Beola Cord placed 3/14, unable to place post pyloric. MD suggested administering large dose of reglan to see if tube will advance to post pyloric. Surgeon agreed to begin trickle feeds when tube gets to post pyloric, but wants to wait until then, KUB ordered for 3/15 to check placement.  Continued administering of precedex, levo, and neo. Pt continuing to have electrolyte imbalances, Phosphorus low (2.3), repletion ordered potassium phosphate in dextrose 5%. Will continue to monitor labs daily and replete as needed.   Continued TPN feeding via PICC line will meet pt's estimated needs until she can be transitioned fully to EN. Nutrition focused physical exam conducted and showed severe fat and muscle depletions. Pt previously edematous which may have masked depletions. Diagnosis updated to severe malnutrition and estimated needs re-calculated to match new diagnosis.  Patient is currently intubated on ventilator support MV: 13.2 L/min Temp (24hrs), Avg:99.5 F (37.5 C), Min:98.1 F (36.7 C), Max:101 F (38.3 C)  MAP (3/14): 82 mmHg  Admit weight: 54.4kg  Current weight: 55.8kg  UOP: 1.6L Ileostomy Output:  Drains/Lines: Endotracheal Tube PICC R brachial Ileostomy RLQ Peripheral IV L forearm Urethral Catheter  Nutritionally Relevant Medications: Insulin q6h Protonix Potassium phosphate in dextrose 5% TPN  Labs Reviewed: Phosphorus 2.3 CBG ranges from 92-159 mg/dL over the last 24 hours HgbA1c 5.8  NUTRITION - FOCUSED PHYSICAL EXAM:  Flowsheet Row Most Recent Value  Orbital Region Severe depletion  Upper Arm Region Moderate depletion  Thoracic and Lumbar Region Severe depletion  Buccal Region Unable to assess  Temple Region Moderate depletion  Clavicle Bone Region Severe depletion  Clavicle and Acromion Bone Region Severe depletion  Scapular Bone Region Severe depletion  Dorsal Hand Unable to assess  Patellar Region Severe depletion  Anterior Thigh Region Severe depletion  Posterior Calf Region Severe depletion  Edema (RD Assessment) None  Hair Reviewed  Eyes Unable to  assess  Mouth Reviewed  [dry]  Skin Reviewed  Nails Unable to assess   Diet Order:   Diet Order             Diet NPO time specified Except for: Other  (See Comments), Ice Chips  Diet effective now                  EDUCATION NEEDS:   Not appropriate for education at this time  Skin:  Skin Assessment: Skin Integrity Issues: Skin Integrity Issues:: Incisions Wound Vac: removed Incisions: closed abd incision with new ileostomy creation  Last BM:  +output via end ileostomy  Height:  Ht Readings from Last 1 Encounters:  11/20/23 5' 7.01" (1.702 m)   Weight:  Wt Readings from Last 1 Encounters:  11/21/23 55.8 kg   Ideal Body Weight:  61.4 kg  BMI:  Body mass index is 19.26 kg/m.  Estimated Nutritional Needs:  Kcal:  1850-2050  Protein:  85-105g  Fluid:  >/= 2L  Louis Meckel Dietetic Intern

## 2023-11-21 NOTE — Progress Notes (Signed)
   Progress Note  8 Days Post-Op  Subjective: Intubated, sedated. On levophed at 4 mcg Ostomy is productive of ~500 mL liquid bilious effluent. Cortrak is in the stomach, unsuccessful at post-pyloric placement. Has bee started on reglan. Daughter at bedside.  Objective: Vital signs in last 24 hours: Temp:  [98.1 F (36.7 C)-101 F (38.3 C)] 98.1 F (36.7 C) (03/14 1159) Pulse Rate:  [49-75] 61 (03/14 1230) Resp:  [16-35] 16 (03/14 1230) BP: (95-160)/(42-70) 95/44 (03/14 1230) SpO2:  [95 %-100 %] 96 % (03/14 1230) FiO2 (%):  [40 %-50 %] 40 % (03/14 1134) Weight:  [55.8 kg] 55.8 kg (03/14 0430) Last BM Date : 11/20/23  Intake/Output from previous day: 03/13 0701 - 03/14 0700 In: 3334.2 [I.V.:3134.1; IV Piggyback:200.1] Out: 2050 [Urine:1600; Stool:450] Intake/Output this shift: Total I/O In: 1456.5 [I.V.:1190.1; IV Piggyback:266.4] Out: 450 [Urine:450]  PE: General: intubated, sedated, eyes are open Heart: RRR Lungs: ventilated respirations Abd: soft, minimally distended, ileostomy productive of liquid dark green stool. Midline incision clean and dry with staples in place  Lab Results:  Recent Labs    11/20/23 0504 11/20/23 1009 11/20/23 1129 11/21/23 0707  WBC 18.4*  --   --  25.5*  HGB 9.4*   < > 9.6* 10.0*  HCT 30.6*   < > 31.0* 32.7*  PLT 389  --   --  464*   < > = values in this interval not displayed.   BMET Recent Labs    11/20/23 1129 11/21/23 0707  NA 149* 148*  K 4.5 4.3  CL 113* 112*  CO2 28 26  GLUCOSE 134* 162*  BUN 55* 50*  CREATININE 0.80 0.98  CALCIUM 8.8* 9.1   PT/INR No results for input(s): "LABPROT", "INR" in the last 72 hours. CMP     Component Value Date/Time   NA 148 (H) 11/21/2023 0707   K 4.3 11/21/2023 0707   CL 112 (H) 11/21/2023 0707   CO2 26 11/21/2023 0707   GLUCOSE 162 (H) 11/21/2023 0707   BUN 50 (H) 11/21/2023 0707   CREATININE 0.98 11/21/2023 0707   CREATININE 0.68 10/15/2023 1137   CALCIUM 9.1 11/21/2023  0707   PROT 4.9 (L) 11/20/2023 0630   ALBUMIN 2.2 (L) 11/20/2023 0630   AST 39 11/20/2023 0630   AST 20 10/15/2023 1137   ALT 38 11/20/2023 0630   ALT 15 10/15/2023 1137   ALKPHOS 120 11/20/2023 0630   BILITOT 0.3 11/20/2023 0630   BILITOT 0.4 10/15/2023 1137   GFRNONAA 59 (L) 11/21/2023 0707   GFRNONAA >60 10/15/2023 1137   Lipase     Component Value Date/Time   LIPASE 44 11/11/2023 1127      Assessment/Plan POD 10/8 S/P ex-lap small bowel resection, left open; ex-lap, small bowel resection, ileostomy and closure, Dr. Dwain Sarna for SBO - Patient transferred to ICU yesterday and re-intubated.  - cortrak team put in a cortrak but could not get it post-pyloric. I would not start tube feeding until the tube is past the pylorus, given her aspiration history. Will order KUB for the morning to follow up on tube location after reglan. - On meropenem for treatment of aspiration pneumonia. Does not need antibiotics for intraabdominal coverage. - VTE: on heparin gtt for a-fib - Remainder of care per CCM. Surgery will continue to follow.   LOS: 10 days     Adam Phenix, [facility] Surgery 11/21/2023, 1:20 PM Please see Amion for pager number during day hours 7:00am-4:30pm

## 2023-11-21 NOTE — Progress Notes (Signed)
 PHARMACY - ANTICOAGULATION CONSULT NOTE  Pharmacy Consult for heparin Indication: atrial fibrillation  No Known Allergies  Patient Measurements: Height: 5' 7.01" (170.2 cm) Weight: 55.8 kg (123 lb 0.3 oz) IBW/kg (Calculated) : 61.62 Heparin Dosing Weight: 49.6 kg   Vital Signs: Temp: 99 F (37.2 C) (03/14 0800) Temp Source: Axillary (03/14 0800) BP: 111/47 (03/14 0800) Pulse Rate: 67 (03/14 0800)  Labs: Recent Labs    11/19/23 0458 11/19/23 1400 11/20/23 0504 11/20/23 0630 11/20/23 1009 11/20/23 1129 11/20/23 2102 11/21/23 0707  HGB 10.5*  --  9.4*  --  8.8* 9.6*  --  10.0*  HCT 32.5*  --  30.6*  --  26.0* 31.0*  --  32.7*  PLT 285  --  389  --   --   --   --  464*  HEPARINUNFRC <0.10*   < >  --   --   --  <0.10* 0.43 0.46  CREATININE 1.25*  --   --  0.91  --  0.80  --  0.98   < > = values in this interval not displayed.    Estimated Creatinine Clearance: 41.7 mL/min (by C-G formula based on SCr of 0.98 mg/dL).  Assessment: 67 yof presenting with abdominal pain now s/p ex-lap small bowel resection followed by small bowel resection, ileostomy and closure. Has been going in/out of Afib (no AC PTA). Was on subQ heparin for DVT ppx (LD 3/11@0516 ).   Heparin level came back therapeutic at 0.46, on heparin infusion at 1150 units/hr. No signs/symptoms of bleeding or infusion issues. Confirmed drawn from opposite side of where heparin running central line. Hgb 10, plt 464 stable.  Goal of Therapy:  Heparin level 0.3-0.5 units/ml Monitor platelets by anticoagulation protocol: Yes   Plan:  Continue heparin infusion at 1150 units/hr Daily heparin level, CBC  Monitor for signs/symptoms of bleeding   Stephenie Acres, PharmD PGY1 Pharmacy Resident 11/21/2023 10:00 AM

## 2023-11-21 NOTE — Procedures (Signed)
 Cortrak  Person Inserting Tube:  Lavon Bothwell T, RD Tube Type:  Cortrak - 55 inches Tube Size:  10 Tube Location:  Left nare Secured by: Bridle Technique Used to Measure Tube Placement:  Marking at nare/corner of mouth Cortrak Secured At:  75 cm   Cortrak Tube Team Note:  Consult received to place a Cortrak feeding tube.   X-ray requied, ordered by the Cortrak team. Please confirm placement prior to using tube.   If the tube becomes dislodged please keep the tube and contact the Cortrak team at [email or web address] for replacement.  If after hours and replacement cannot be delayed, place a NG tube and confirm placement with an abdominal x-ray.    Shelle Iron RD, LDN Contact via [employer].

## 2023-11-21 NOTE — TOC Progression Note (Signed)
 Transition of Care [facility]) - Progression Note    Patient Details  Name: Jenny White MRN: [id number] Date of Birth: Jul 01, 1946  Transition of Care [facility]) CM/SW Contact  Mearl Latin, LCSW Phone Number: 11/21/2023, 9:57 AM  Clinical Narrative:    Patient intubated. CSW continuing to follow.    Expected Discharge Plan: Home w Home Health Services Barriers to Discharge: Continued Medical Work up  Expected Discharge Plan and Services   Discharge Planning Services: CM Consult Post Acute Care Choice: Home Health Living arrangements for the past 2 months: Single Family Home                                       Social Determinants of Health (SDOH) Interventions SDOH Screenings   Food Insecurity: No Food Insecurity (11/11/2023)  Housing: Low Risk  (11/11/2023)  Transportation Needs: No Transportation Needs (11/11/2023)  Utilities: Not At Risk (11/11/2023)  Depression (PHQ2-9): Low Risk  (06/17/2023)  Financial Resource Strain: Low Risk  (11/06/2023)   Received from [facility]  Social Connections: Unknown (11/11/2023)  Tobacco Use: Medium Risk (11/11/2023)    Readmission Risk Interventions     No data to display

## 2023-11-21 NOTE — Progress Notes (Signed)
 PT Cancellation Note  Patient Details Name: Jenny White MRN: [id number] DOB: Oct 18, 1945   Cancelled Treatment:    Reason Eval/Treat Not Completed: Medical issues which prohibited therapy remain this morning per discussion with RN. Will continue to follow and re-evaluate as appropriate.   [name], PT, DPT   Acute Rehabilitation Department Office [phone] Secure Chat Communication Preferred    Ronnie Derby 11/21/2023, 8:41 AM

## 2023-11-21 NOTE — Progress Notes (Addendum)
 PHARMACY - TOTAL PARENTERAL NUTRITION CONSULT NOTE   Indication:  Ischemic bowel s/p small bowel resection, end ileostomy  Patient Measurements: Height: 5' 7.01" (170.2 cm) Weight: 55.8 kg (123 lb 0.3 oz) IBW/kg (Calculated) : 61.62 TPN AdjBW (KG): 54.4 Body mass index is 19.26 kg/m. Usual Weight: 58 kg   Assessment:  78 year old woman with hx lung adenocarcinoma, prior splenectomy among others presented to the emergency room with complaints of abdominal pain with N/V for the past week. She states the abdominal pain got worsened after dinner. She reported experiencing nausea and vomiting this morning. CT scan showed high grade small bowel obstruction with transition point in the right lower quadrant along the distal ileum. Some trace of ascites and mesenteric stranding. Concern for bowel wall ischemia. Broad spectrum antibiotics (zosyn) was started, blood cultures pending. Remains on pressor support and enteral nutrition now on hold. Pharmacy consulted to initiate TPN.   3/10 BMP glucose and finger sticks are significantly different. Patient does not have hx of diabetes so would not expect BG to be in the 400s on TPN. Repeat labs 3/10 after holding TPN for 5 min and flushing well shows improvement in BG and now matching POC glucose. Labs with high glucose were likely contaminated with TPN.  Glucose / Insulin: A1c 5.8%, CBGs 140-150s (on hydrocortisone), off insulin Electrolytes: Na down 148, K 4.3, CL 112, CO2 26, iCa 1.3/CoCa 10.5 (none in TPN), Mg 2.7, Phos 2.3, others wnl Renal:  Scr 0.98 down (bsl ~1), BUN 50 Hepatic: alb 2.2, Alk phos/AST/ALT/Tbili wnl, TG 75 Intake / Output; MIVF:  UOP 1.2 mL/kg/hr, ostomy 450 mL  GI Imaging:  3/4 CT - high-grade SBO is with transition point in the RLQ along the distal ileum, cannot exclude bowel wall ischemia; distended esophagus 3/4 KUB: Dilated fluid-filled loops of bowel  GI Surgeries / Procedures:  3/4 ex lap, small bowel resection, open abdomen  with bowel in discontinuity  3/6 small bowel resection, end ileostomy, closure of abdomen  Central access: PICC 11/12/23 TPN start date: 11/14/2023  Nutritional Goals: Goal TPN rate is 55 mL/hr (provides 86 g of protein and 1653 kcals per day)  RD Assessment: Estimated Needs Total Energy Estimated Needs: 1650-1850 kcals Total Protein Estimated Needs: 85-105 g Total Fluid Estimated Needs: >/= 1.7 L  Current Nutrition:  NPO + TPN  Plan:  Continue TPN at goal 50 mL/hr at 1800 to meet 100% goal kcal and AA (decreased rate 3/13 given less volume needed with removal of lytes, but still meeting goal) Electrolytes in TPN: decrease Na 0 mEq/L, decrease K 30 mEq/L, Ca 0 mEq/L, increase Mg 10 mEq/L, increase Phos 15 mmol/L. Cl:Ac max Ac Add standard MVI and trace elements to TPN Add sensitive SSI q6h while on stress steroids Monitor TPN labs on Mon/Thurs, daily as needed - 3/15 Cortrak to be placed today  KPhos 10 mmol IV x1  Thank you for involving pharmacy in this patient's care.  Loura Back, PharmD, BCPS Clinical Pharmacist Clinical phone for 11/21/2023 is [phone] 11/21/2023 7:06 AM

## 2023-11-21 NOTE — Telephone Encounter (Signed)
 Patient Product/process development scientist completed.    The patient is insured through [facility]. Patient has Medicare and is not eligible for a copay card, but may be able to apply for patient assistance or Medicare RX Payment Plan (Patient Must reach out to their plan, if eligible for payment plan), if available.    Ran test claim for Eliquis 5 mg and the current 30 day co-pay is $417.12 due to a deductible.  Ran test claim for Xarelto 20 mg and the current 30 day co-pay is $415.72 due to a deductible.  This test claim was processed through [facility]- copay amounts may vary at other pharmacies due to pharmacy/plan contracts, or as the patient moves through the different stages of their insurance plan.     Jenny White, CPHT Pharmacy Technician III Certified Patient Advocate [facility] Pharmacy Patient Advocate Team Direct Number: [phone]  Fax: [phone]

## 2023-11-21 NOTE — Progress Notes (Signed)
 OT Cancellation Note  Patient Details Name: Jenny White MRN: [id number] DOB: [birth year]   Cancelled Treatment:    Reason Eval/Treat Not Completed: Patient not medically ready (intubated sedated RN request to hold)  [name] 11/21/2023, 8:46 AM

## 2023-11-22 ENCOUNTER — Inpatient Hospital Stay (HOSPITAL_COMMUNITY)

## 2023-11-22 DIAGNOSIS — E43 Unspecified severe protein-calorie malnutrition: Secondary | ICD-10-CM

## 2023-11-22 DIAGNOSIS — J9621 Acute and chronic respiratory failure with hypoxia: Secondary | ICD-10-CM | POA: Diagnosis not present

## 2023-11-22 DIAGNOSIS — J9622 Acute and chronic respiratory failure with hypercapnia: Secondary | ICD-10-CM | POA: Diagnosis not present

## 2023-11-22 LAB — PHOSPHORUS: Phosphorus: 3.1 mg/dL (ref 2.5–4.6)

## 2023-11-22 LAB — BASIC METABOLIC PANEL
Anion gap: 7 (ref 5–15)
BUN: 48 mg/dL — ABNORMAL HIGH (ref 8–23)
CO2: 27 mmol/L (ref 22–32)
Calcium: 8.8 mg/dL — ABNORMAL LOW (ref 8.9–10.3)
Chloride: 111 mmol/L (ref 98–111)
Creatinine, Ser: 0.87 mg/dL (ref 0.44–1.00)
GFR, Estimated: >60 mL/min (ref >60)
Glucose, Bld: 138 mg/dL — ABNORMAL HIGH (ref 70–99)
Potassium: 4.3 mmol/L (ref 3.5–5.1)
Sodium: 145 mmol/L (ref 135–145)

## 2023-11-22 LAB — CBC
HCT: 29.3 % — ABNORMAL LOW (ref 36.0–46.0)
Hemoglobin: 9.1 g/dL — ABNORMAL LOW (ref 12.0–15.0)
MCH: 29.9 pg (ref 26.0–34.0)
MCHC: 31.1 g/dL (ref 30.0–36.0)
MCV: 96.4 fL (ref 80.0–100.0)
Platelets: 475 10*3/uL — ABNORMAL HIGH (ref 150–400)
RBC: 3.04 MIL/uL — ABNORMAL LOW (ref 3.87–5.11)
RDW: 18.7 % — ABNORMAL HIGH (ref 11.5–15.5)
WBC: 23.9 10*3/uL — ABNORMAL HIGH (ref 4.0–10.5)
nRBC: 0.2 % (ref 0.0–0.2)

## 2023-11-22 LAB — CULTURE, BAL-QUANTITATIVE W GRAM STAIN: Culture: NO GROWTH

## 2023-11-22 LAB — GLUCOSE, CAPILLARY
Glucose-Capillary: 134 mg/dL — ABNORMAL HIGH (ref 70–99)
Glucose-Capillary: 107 mg/dL — ABNORMAL HIGH (ref 70–99)
Glucose-Capillary: 132 mg/dL — ABNORMAL HIGH (ref 70–99)
Glucose-Capillary: 130 mg/dL — ABNORMAL HIGH (ref 70–99)

## 2023-11-22 LAB — HEPARIN LEVEL (UNFRACTIONATED): Heparin Unfractionated: 0.26 [IU]/mL — ABNORMAL LOW (ref 0.30–0.70)

## 2023-11-22 LAB — MAGNESIUM: Magnesium: 3.4 mg/dL — ABNORMAL HIGH (ref 1.7–2.4)

## 2023-11-22 MED ORDER — METOCLOPRAMIDE HCL 5 MG/ML IJ SOLN
10.0000 mg | Freq: Four times a day (QID) | INTRAMUSCULAR | Status: DC
Start: 2023-11-22 — End: 2023-11-27
  Administered 2023-11-22 – 2023-11-23 (×4): 10 mg via INTRAVENOUS
  Filled 2023-11-22 (×4): qty 2

## 2023-11-22 MED ORDER — VITAL HIGH PROTEIN PO LIQD
1000.0000 mL | ORAL | Status: AC
  Administered 2023-11-22: 1000 mL

## 2023-11-22 MED ORDER — TRACE MINERALS CU-MN-SE-ZN 300-55-60-3000 MCG/ML IV SOLN
INTRAVENOUS | Status: AC
Start: 2023-11-22 — End: 2023-11-23
  Filled 2023-11-22: qty 576

## 2023-11-22 NOTE — Progress Notes (Signed)
 eLink Physician-Brief Progress Note Patient Name: Jenny White DOB: 06-19-1946 MRN: [id number]   Date of Service  11/22/2023  HPI/Events of Note  Patient has CBG ordered q 4 and q 6 CBGS has been relative stable in the 100s with SSI q6 Receiving TF and TPN  eICU Interventions  Changed CBGs to q 6 only     Intervention Category Intermediate Interventions: Hyperglycemia - evaluation and treatment  Jenny White Jenny White 11/22/2023, 8:11 PM

## 2023-11-22 NOTE — Progress Notes (Signed)
 PHARMACY - TOTAL PARENTERAL NUTRITION CONSULT NOTE   Indication:  Ischemic bowel s/p small bowel resection, end ileostomy  Patient Measurements: Height: 5' 7.01" (170.2 cm) Weight: 58.2 kg (128 lb 4.9 oz) IBW/kg (Calculated) : 61.62 TPN AdjBW (KG): 54.4 Body mass index is 20.09 kg/m. Usual Weight: 58 kg   Assessment:  78 year old woman with hx lung adenocarcinoma, prior splenectomy among others presented to the emergency room with complaints of abdominal pain with N/V for the past week. She states the abdominal pain got worsened after dinner. She reported experiencing nausea and vomiting this morning. CT scan showed high grade small bowel obstruction with transition point in the right lower quadrant along the distal ileum. Some trace of ascites and mesenteric stranding. Concern for bowel wall ischemia. Broad spectrum antibiotics (zosyn) was started, blood cultures pending. Remains on pressor support and enteral nutrition now on hold. Pharmacy consulted to initiate TPN.   3/10 BMP glucose and finger sticks are significantly different. Patient does not have hx of diabetes so would not expect BG to be in the 400s on TPN. Repeat labs 3/10 after holding TPN for 5 min and flushing well shows improvement in BG and now matching POC glucose. Labs with high glucose were likely contaminated with TPN.  Glucose / Insulin: A1c 5.8%, CBGs 122-157 (on hydrocortisone), 5 units SSI in last 18h Electrolytes: Na down 145, K 4.3, CL 111, CO2 27, iCa 1.3/CoCa 10.2 (none in TPN), Mg up 3.4, Phos 3.1, others wnl Renal:  Scr 0.87 down (bsl ~1), BUN 50 Hepatic: alb 2.2, Alk phos/AST/ALT/Tbili wnl, TG 75 Intake / Output; MIVF:  UOP 1.1 mL/kg/hr, ostomy 125 mL  GI Imaging:  3/4 CT - high-grade SBO is with transition point in the RLQ along the distal ileum, cannot exclude bowel wall ischemia; distended esophagus 3/4 KUB: Dilated fluid-filled loops of bowel  GI Surgeries / Procedures:  3/4 ex lap, small bowel  resection, open abdomen with bowel in discontinuity  3/6 small bowel resection, end ileostomy, closure of abdomen 3/14 Cortrak placed  Central access: PICC 11/12/23 TPN start date: 11/14/2023  Nutritional Goals: Goal TPN rate is 55 mL/hr (provides 86 g of protein and 1653 kcals per day)  RD Assessment: Estimated Needs Total Energy Estimated Needs: 1850-2050 Total Protein Estimated Needs: 85-105g Total Fluid Estimated Needs: >/= 2L  Current Nutrition:  NPO + TPN  Plan:  Continue TPN at goal 50 mL/hr at 1800 to meet 100% goal kcal and AA (decreased rate 3/13 given less volume needed with removal of lytes, but still meeting goal) Electrolytes in TPN: decrease Na 0 mEq/L, decrease K 30 mEq/L, Ca 0 mEq/L, decrease Mg 0 mEq/L, increase Phos 15 mmol/L. Cl:Ac max Ac Add standard MVI and trace elements to TPN Add sensitive SSI q6h while on stress steroids Monitor TPN labs on Mon/Thurs, daily as needed  F/U ability to start TF  Thank you for involving pharmacy in this patient's care.  Loura Back, PharmD, BCPS Clinical Pharmacist Clinical phone for 11/22/2023 is [phone] 11/22/2023 7:10 AM

## 2023-11-22 NOTE — Progress Notes (Signed)
   Progress Note  9 Days Post-Op  Subjective: Intubated, sedated. Ostomy output decreased Cortrak is in the stomach, unsuccessful at post-pyloric placement. Has bee started on reglan. No family at bedside this morning.  Objective: Vital signs in last 24 hours: Temp:  [98.1 F (36.7 C)-100.4 F (38 C)] 100.4 F (38 C) (03/15 0800) Pulse Rate:  [51-84] 75 (03/15 0800) Resp:  [11-30] 21 (03/15 0800) BP: (94-168)/(41-81) 129/42 (03/15 0800) SpO2:  [95 %-100 %] 96 % (03/15 0800) FiO2 (%):  [40 %] 40 % (03/15 0722) Weight:  [58.2 kg] 58.2 kg (03/15 0500) Last BM Date : 11/20/23  Intake/Output from previous day: 03/14 0701 - 03/15 0700 In: 4763 [I.V.:4256; IV Piggyback:506.9] Out: 1700 [Urine:1575; Stool:125] Intake/Output this shift: No intake/output data recorded.  PE: General: intubated, sedated, eyes are open Heart: RRR Lungs: ventilated respirations Abd: soft, minimally distended, ileostomy productive of liquid dark green stool. Midline incision clean and dry with staples in place  Lab Results:  Recent Labs    11/21/23 0707 11/22/23 0535  WBC 25.5* 23.9*  HGB 10.0* 9.1*  HCT 32.7* 29.3*  PLT 464* 475*   BMET Recent Labs    11/21/23 0707 11/22/23 0535  NA 148* 145  K 4.3 4.3  CL 112* 111  CO2 26 27  GLUCOSE 162* 138*  BUN 50* 48*  CREATININE 0.98 0.87  CALCIUM 9.1 8.8*   PT/INR No results for input(s): "LABPROT", "INR" in the last 72 hours. CMP     Component Value Date/Time   NA 145 11/22/2023 0535   K 4.3 11/22/2023 0535   CL 111 11/22/2023 0535   CO2 27 11/22/2023 0535   GLUCOSE 138 (H) 11/22/2023 0535   BUN 48 (H) 11/22/2023 0535   CREATININE 0.87 11/22/2023 0535   CREATININE 0.68 10/15/2023 1137   CALCIUM 8.8 (L) 11/22/2023 0535   PROT 4.9 (L) 11/20/2023 0630   ALBUMIN 2.2 (L) 11/20/2023 0630   AST 39 11/20/2023 0630   AST 20 10/15/2023 1137   ALT 38 11/20/2023 0630   ALT 15 10/15/2023 1137   ALKPHOS 120 11/20/2023 0630   BILITOT 0.3  11/20/2023 0630   BILITOT 0.4 10/15/2023 1137   GFRNONAA >60 11/22/2023 0535   GFRNONAA >60 10/15/2023 1137   Lipase     Component Value Date/Time   LIPASE 44 11/11/2023 1127      Assessment/Plan POD 10/8 S/P ex-lap small bowel resection, left open; ex-lap, small bowel resection, ileostomy and closure, Dr. Dwain Sarna for SBO - Patient transferred to ICU yesterday and re-intubated.  - cortrak team put in a cortrak but could not get it post-pyloric. I would not start tube feeding until the tube is past the pylorus, given her aspiration history. Continue reglan, check KUB in AM again to see if it will migrate. - On meropenem for treatment of aspiration pneumonia. Does not need antibiotics for intraabdominal coverage. - VTE: on heparin gtt for a-fib - Remainder of care per CCM. Surgery will continue to follow.   LOS: 11 days     Quentin Ore, MD [facility] Surgery 11/22/2023, 10:35 AM Please see Amion for pager number during day hours 7:00am-4:30pm

## 2023-11-22 NOTE — Progress Notes (Signed)
 PHARMACY - ANTICOAGULATION CONSULT NOTE  Pharmacy Consult for heparin Indication: atrial fibrillation  No Known Allergies  Patient Measurements: Height: 5' 7.01" (170.2 cm) Weight: 58.2 kg (128 lb 4.9 oz) IBW/kg (Calculated) : 61.62 Heparin Dosing Weight: 49.6 kg   Vital Signs: Temp: 98.6 F (37 C) (03/15 0400) Temp Source: Axillary (03/15 0400) BP: 107/45 (03/15 0600) Pulse Rate: 84 (03/15 0722)  Labs: Recent Labs    11/20/23 0504 11/20/23 0630 11/20/23 1129 11/20/23 2102 11/21/23 0707 11/22/23 0535 11/22/23 0537  HGB 9.4*   < > 9.6*  --  10.0* 9.1*  --   HCT 30.6*   < > 31.0*  --  32.7* 29.3*  --   PLT 389  --   --   --  464* 475*  --   HEPARINUNFRC  --   --  <0.10* 0.43 0.46  --  0.26*  CREATININE  --    < > 0.80  --  0.98 0.87  --    < > = values in this interval not displayed.    Estimated Creatinine Clearance: 49 mL/min (by C-G formula based on SCr of 0.87 mg/dL).  Assessment: 9 yof presenting with abdominal pain now s/p ex-lap small bowel resection followed by small bowel resection, ileostomy and closure. Has been going in/out of Afib (no AC PTA). Was on subQ heparin for DVT ppx (LD 3/11@0516 ).   Heparin level subtherapeutic at 0.26, on heparin infusion at 1150 units/hr. No signs/symptoms of bleeding or infusion issues per RN. Confirmed drawn from opposite side of where heparin running central line. Hgb stable 9-10s, platelets elevated.  Goal of Therapy:  Heparin level 0.3-0.5 units/ml Monitor platelets by anticoagulation protocol: Yes   Plan:  Increase heparin infusion to 1200 units/hr Daily heparin level, CBC  Monitor for signs/symptoms of bleeding  Thank you for involving pharmacy in this patient's care.  Loura Back, PharmD, BCPS Clinical Pharmacist Clinical phone for 11/22/2023 is [phone] 11/22/2023 7:31 AM

## 2023-11-22 NOTE — Progress Notes (Signed)
 NAME:  BANEZA BARTOSZEK, MRN:  [id number], DOB:  06/16/1946, LOS: 11 ADMISSION DATE:  11/11/2023 CONSULTATION DATE:  11/11/2023 REFERRING MD:  Dwain Sarna - CCS CHIEF COMPLAINT: Shock  History of Present Illness:  Sharlyn Odonnel 78 year old woman with a pmhx significant for COPD, GERD, nephrolithiasis , HTN, OSA, Lung cancer ( Adenocarcinoma, diagnosed 05/2023) who presented to the emergency room with complaints of abdominal pain with N/V for the past week. She states the abdominal  pain got worsened after dinner. She reported experiencing nausea and  vomiting this morning.   In the ED  her labs were significant for a lactate 4.1 After fluid resuscitation, Lactate decreased to 2.5. CT scan showed high grade small bowel obstruction with transition point in the right lower quadrant along the distal ileum. Some trace of ascites and mesenteric stranding.Concern for bowel wall ischemia. Broad spectrum antibiotics ( zosyn) was started, blood cultures pending.   Patient was taken to the ER emergently with CCS 3/4 for Ex-lap / LOA. Intraoperative course  was notable for free fluid on entry, with closed loop bowel obstruction noted secondary to tight band adhesion. Patient left in discontinuity/ anastomosis not completed with open abdomen and plan for second look in 24-48 hrs. Patient remained intubated and was transferred to ICU in critical condition.   Pertinent Medical History:   Past Medical History:  Diagnosis Date   Arthritis    in back   COPD (chronic obstructive pulmonary disease) (HCC)    Difficult airway    Due to limited oral opening. Elective glidescope used previously.   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 Elevated LA found to have SBO taken emergently to surgery for EX-LAP.  3/7 extubated reintubated 3/10 extubated again 3/11 OFF NIPPV 3/12 got ativan at HS. Sleepy and difficult to arouse all day. Stat ABG. Stopping ativan  3/13 transferred to ICU for obtundation. Re-intubated.   Interim History / Subjective:   Failed SBT yesterday.  This morning wakes up appears uncomfortable but denies pain.  Objective:  Blood pressure (!) 116/39, pulse 62, temperature (!) 100.4 F (38 C), temperature source Axillary, resp. rate (!) 24, height 5' 7.01" (1.702 m), weight 58.2 kg, SpO2 96%.    Vent Mode: PRVC FiO2 (%):  [40 %] 40 % Set Rate:  [26 bmp] 26 bmp Vt Set:  [490 mL] 490 mL PEEP:  [6 cmH20-8 cmH20] 6 cmH20 Plateau Pressure:  [18 cmH20-24 cmH20] 23 cmH20   Intake/Output Summary (Last 24 hours) at 11/22/2023 1119 Last data filed at 11/22/2023 0700 Gross per 24 hour  Intake 3764.13 ml  Output 1700 ml  Net 2064.13 ml   Filed Weights   11/20/23 0330 11/21/23 0430 11/22/23 0500  Weight: 57.3 kg 55.8 kg 58.2 kg   Physical Examination: General elderly female in NAD on vent HENT OG tube ET tube in place Pulm chest now clear to auscultation bilaterally with acceptable airway pressures. Card heart sounds are unremarkable. Abd mildly distended soft.  Intact midline incision. Ext no pitting edema.  Generalized  loss of muscle mass. Neuro: Spontaneously awake, alert, follows commands and cooperated with exam.  Generalized small amplitude high-frequency tremor.  Ancillary tests personally reviewed:  Normal electrolytes.  Creatinine normal.  Acceptable glycemic control. Mild multifactorial anemia. Persistent leukocytosis 23.9. KUB shows small bore feeding tube tip past the pylorus likely the third part of the duodenum. Assessment & Plan:   Acute on chronic resp failure with hypoxia,  hypercarbia HAP:  Aeromonas and Hafnia Alvei pneumonia > both S to meropenmem Likely a component of aspiration as well.  Chronic COPD advanced Shock now resolved Acute toxic metabolic encephalopathy now resolved High grade small bowel obstruction s/p ex lap 3/4 with resection and end ileostomy Atrial fib AKI: Resolved Severe protein calorie malnutrition DM2 RLL Cancer s/p SBRT  Plan:  -Minimize sedation and proceed to SBT today. -Continue current bronchodilator regimen.  If fails SBT consider steroids and diuresis. -Respiratory failure multifactorial due to COPD, HCAP and generalized deconditioning following surgery with malnutrition.  Difficult to anticipate when patient will have recovered sufficiently to tolerate extubation.  Will likely remain at risk for further pulmonary complications for some time, as some of these factors are not readily correctable in the short-term. -Start trickle feeds via postpyloric tube.  Continue TPN for now to ensure adequate nutrition given degree of compromise. -Continue heparin for atrial fibrillation.  Continue amiodarone IV until patient is successfully extubated.  Would avoid long-term amiodarone use given chronic lung disease. -Current glycemic control is adequate.  Best Practice: (right click and "Reselect all SmartList Selections" daily)   Diet/type: TPN, trickle feeds DVT prophylaxis: heparin gtt GI prophylaxis: PPI Lines: PICC Foley:  Yes, and it is still needed Code Status:  full code Last date of multidisciplinary goals of care discussion:   CRITICAL CARE Performed by: Lynnell Catalan   Total critical care time: 40 minutes  Critical care time was exclusive of separately billable procedures and treating other patients.  Critical care was necessary to treat or prevent imminent or life-threatening deterioration.  Critical care was time spent personally by me on the following activities: development of treatment plan with patient  and/or surrogate as well as nursing, discussions with consultants, evaluation of patient's response to treatment, examination of patient, obtaining history from patient or surrogate, ordering and performing treatments and interventions, ordering and review of laboratory studies, ordering and review of radiographic studies, pulse oximetry, re-evaluation of patient's condition and participation in multidisciplinary rounds.  Lynnell Catalan, MD [facility] ICU Physician [facility]  Pager: [phone] Mobile: [phone] After hours: [phone].

## 2023-11-22 NOTE — Progress Notes (Signed)
 RT note. Patient placed on SBT at this time per MD request, intially postponed due to PEEP at 8. Patient on  10/5 with volume 400-500, RR noted between 25-30. Patient with no labored breathing noted at this time. RT will continue to monitor.    11/22/23 1444  Vent Select  Invasive or Noninvasive Invasive  Adult Vent Y  Airway 7.5 mm  Placement Date/Time: 11/20/23 0920   Grade View: Grade 1  Placed By: (c) Other (Comment)  Airway Device: Endotracheal Tube  Laryngoscope Blade: MAC;3  ETT Types: Oral  Size (mm): 7.5 mm  Cuffed: Cuffed  Insertion attempts: 1  Airway Equipment: Stylet;...  Secured at (cm) 23 cm  Measured From Lips  Secured Location Right  Secured By English as a second language teacher Yes  Tube Holder Repositioned Yes  Cuff Pressure (cm H2O) Clear OR 27-39 CmH2O  Site Condition Dry  Adult Ventilator Settings  Vent Type Servo i  Humidity HME  Vent Mode (S)  PSV;CPAP  FiO2 (%) 40 %  Pressure Support 10 cmH20  PEEP 5 cmH20  Adult Ventilator Measurements  Peak Airway Pressure 16 L/min  Mean Airway Pressure 8 cmH20  Resp Rate Spontaneous 29 br/min  Resp Rate Total 29 br/min  Spont TV 455 mL  Measured Ve 11.6 L  Auto PEEP 5 cmH20  SpO2 98 %

## 2023-11-23 DIAGNOSIS — E43 Unspecified severe protein-calorie malnutrition: Secondary | ICD-10-CM | POA: Diagnosis not present

## 2023-11-23 DIAGNOSIS — J9622 Acute and chronic respiratory failure with hypercapnia: Secondary | ICD-10-CM | POA: Diagnosis not present

## 2023-11-23 DIAGNOSIS — J9621 Acute and chronic respiratory failure with hypoxia: Secondary | ICD-10-CM | POA: Diagnosis not present

## 2023-11-23 LAB — GLUCOSE, CAPILLARY
Glucose-Capillary: 123 mg/dL — ABNORMAL HIGH (ref 70–99)
Glucose-Capillary: 133 mg/dL — ABNORMAL HIGH (ref 70–99)
Glucose-Capillary: 134 mg/dL — ABNORMAL HIGH (ref 70–99)
Glucose-Capillary: 143 mg/dL — ABNORMAL HIGH (ref 70–99)
Glucose-Capillary: 149 mg/dL — ABNORMAL HIGH (ref 70–99)

## 2023-11-23 LAB — BASIC METABOLIC PANEL
Anion gap: 8 (ref 5–15)
BUN: 47 mg/dL — ABNORMAL HIGH (ref 8–23)
CO2: 27 mmol/L (ref 22–32)
Calcium: 8.9 mg/dL (ref 8.9–10.3)
Chloride: 112 mmol/L — ABNORMAL HIGH (ref 98–111)
Creatinine, Ser: 0.74 mg/dL (ref 0.44–1.00)
GFR, Estimated: >60 mL/min (ref >60)
Glucose, Bld: 142 mg/dL — ABNORMAL HIGH (ref 70–99)
Potassium: 3.7 mmol/L (ref 3.5–5.1)
Sodium: 147 mmol/L — ABNORMAL HIGH (ref 135–145)

## 2023-11-23 LAB — CBC
HCT: 30.9 % — ABNORMAL LOW (ref 36.0–46.0)
Hemoglobin: 9.4 g/dL — ABNORMAL LOW (ref 12.0–15.0)
MCH: 29.7 pg (ref 26.0–34.0)
MCHC: 30.4 g/dL (ref 30.0–36.0)
MCV: 97.5 fL (ref 80.0–100.0)
Platelets: 544 10*3/uL — ABNORMAL HIGH (ref 150–400)
RBC: 3.17 MIL/uL — ABNORMAL LOW (ref 3.87–5.11)
RDW: 18.8 % — ABNORMAL HIGH (ref 11.5–15.5)
WBC: 22.7 10*3/uL — ABNORMAL HIGH (ref 4.0–10.5)
nRBC: 0.1 % (ref 0.0–0.2)

## 2023-11-23 LAB — HEPARIN LEVEL (UNFRACTIONATED): Heparin Unfractionated: 0.37 [IU]/mL (ref 0.30–0.70)

## 2023-11-23 MED ORDER — FUROSEMIDE 10 MG/ML IJ SOLN
INTRAMUSCULAR | Status: AC
  Filled 2023-11-23: qty 4

## 2023-11-23 MED ORDER — FUROSEMIDE 10 MG/ML IJ SOLN
40.0000 mg | Freq: Once | INTRAMUSCULAR | Status: AC
Start: 2023-11-23 — End: 2023-11-23
  Administered 2023-11-23: 40 mg via INTRAVENOUS

## 2023-11-23 MED ORDER — TRACE MINERALS CU-MN-SE-ZN 300-55-60-3000 MCG/ML IV SOLN
INTRAVENOUS | Status: AC
  Filled 2023-11-23: qty 576

## 2023-11-23 MED ORDER — VITAL 1.5 CAL PO LIQD
1000.0000 mL | ORAL | Status: DC
  Administered 2023-11-23 – 2023-11-25 (×3): 1000 mL

## 2023-11-23 MED ORDER — STERILE WATER FOR INJECTION IJ SOLN
INTRAMUSCULAR | Status: AC
  Filled 2023-11-23: qty 10

## 2023-11-23 MED ORDER — GABAPENTIN 250 MG/5ML PO SOLN
300.0000 mg | Freq: Every day | ORAL | Status: DC
  Administered 2023-11-23: 300 mg
  Filled 2023-11-23: qty 6

## 2023-11-23 MED ORDER — BUSPIRONE HCL 5 MG PO TABS
5.0000 mg | ORAL_TABLET | Freq: Two times a day (BID) | ORAL | Status: DC
  Administered 2023-11-23 (×2): 5 mg
  Filled 2023-11-23 (×3): qty 1

## 2023-11-23 MED ORDER — FENTANYL CITRATE PF 50 MCG/ML IJ SOSY: 25.0000 ug | PREFILLED_SYRINGE | INTRAMUSCULAR | Status: DC | PRN

## 2023-11-23 MED ORDER — POTASSIUM CHLORIDE 20 MEQ PO PACK
40.0000 meq | PACK | Freq: Once | ORAL | Status: AC
  Administered 2023-11-23: 40 meq
  Filled 2023-11-23: qty 2

## 2023-11-23 MED ORDER — GABAPENTIN 250 MG/5ML PO SOLN
100.0000 mg | Freq: Two times a day (BID) | ORAL | Status: DC
Start: 2023-11-23 — End: 2023-11-24
  Administered 2023-11-23: 100 mg
  Filled 2023-11-23 (×3): qty 2

## 2023-11-23 MED ORDER — ENOXAPARIN SODIUM 60 MG/0.6ML IJ SOSY
60.0000 mg | PREFILLED_SYRINGE | Freq: Two times a day (BID) | INTRAMUSCULAR | Status: DC
  Administered 2023-11-23 – 2023-11-26 (×7): 60 mg via SUBCUTANEOUS
  Filled 2023-11-23 (×8): qty 0.6

## 2023-11-23 MED ORDER — OLANZAPINE 10 MG IM SOLR
5.0000 mg | Freq: Once | INTRAMUSCULAR | Status: AC
  Administered 2023-11-23: 5 mg via INTRAVENOUS
  Filled 2023-11-23: qty 10

## 2023-11-23 MED ORDER — ONDANSETRON HCL 4 MG/2ML IJ SOLN
4.0000 mg | Freq: Four times a day (QID) | INTRAMUSCULAR | Status: DC | PRN
  Administered 2023-11-23 – 2023-12-09 (×8): 4 mg via INTRAVENOUS
  Filled 2023-11-23 (×8): qty 2

## 2023-11-23 MED ORDER — MELATONIN 5 MG PO TABS: 5.0000 mg | ORAL_TABLET | Freq: Every evening | ORAL | Status: DC | PRN

## 2023-11-23 NOTE — Progress Notes (Signed)
 Nutrition Follow-up  DOCUMENTATION CODES:   Severe malnutrition in context of chronic illness (COPD and lung cancer)  INTERVENTION:  Continue TPN administration to meet calorie and protein needs. Enteral Nutrition via post-pyloric Cortrak: Vital 1.5 at 20 mL/hr Once MD ok, recommend advancing TF by 10 mL/hr q8h to goal rate of 50 mL/hr (1200 mL/day) 60 mL ProSource TF20 - Daily TF at goal provides 1880 kcal, 101 gm protein, and 916 mL free water daily.   NUTRITION DIAGNOSIS:   Severe Malnutrition related to chronic illness (COPD and lung cancer) as evidenced by severe muscle depletion, severe fat depletion. - Ongoing   GOAL:   Patient will meet greater than or equal to 90% of their needs - Met via nutrition support  MONITOR:   Vent status, Labs, TF tolerance, I & O's  REASON FOR ASSESSMENT:   Consult Enteral/tube feeding initiation and management  ASSESSMENT:   78 yo female admitted with mixed shock, septic and hypovolemic, high grade SBO with bowel ischemia. PMH includes COPD, GERD, nephrolithiasis, HTN, OSA, lung cancer (adenocarcinoma RLL dx 05/2023)  3/04 OR: Ex Lap, SB resection, Abthera wound VAC placement, open abdomen with bowel in discontinuity  3/05 PICC line placed 3/06 Return to OR: Re-opening of recent ex lap, SB resection with end ileostomy, abd wall closure and wound VAC remove 3/07 TPN initiated, Extubated, Re-Intubated 3/10 Extubated  3/11 off BiPAP 3/13 re-intubated, transferred to 4N ICU 3/14 Cortrak placed, currently gastric (per x-ray 3/15, Cortrak tube likely in third portion of duodenum) 3/15 Started trickle TF  RD received a consult to initiated trickle tube feeds via Cortrak tube. Discussed with RN, Vital HP currently infusing. Plan to change to Vital 1.5 at time of bottle change.   Patient is currently remains intubated and on ventilator support. MV: 10.5 L/min MAP (cuff): 72 Temp (24hrs), Avg:99.9 F (37.7 C), Min:98.8 F (37.1 C),  Max:101.3 F (38.5 C)  Drips Amiodarone Precedex Fentanyl Heparin Levophed  Medications reviewed and include: Colace, Lasix, Solu-Cortef, NovoLog SSI, Reglan q6h, Protonix, Miralax Labs reviewed. CBG: 107-134 x 24 hrs  UOP: 1875 mL x 24 hrs Stool Output: 700 mL x 24 hrs   Diet Order:   Diet Order             Diet NPO time specified Except for: Other (See Comments)  Diet effective now                   EDUCATION NEEDS:   Not appropriate for education at this time  Skin:  Skin Assessment: Skin Integrity Issues: Skin Integrity Issues:: Incisions Wound Vac: removed Incisions: closed abd incision with new ileostomy creation  Last BM:  +output via end ileostomy  Height:  Ht Readings from Last 1 Encounters:  11/20/23 5' 7.01" (1.702 m)   Weight:  Wt Readings from Last 1 Encounters:  11/23/23 59 kg   Ideal Body Weight:  61.4 kg  BMI:  Body mass index is 20.37 kg/m.  Estimated Nutritional Needs:  Kcal:  1850-2050 Protein:  85-105g Fluid:  >/= 2L   Kirby Crigler RD, LDN Clinical Dietitian

## 2023-11-23 NOTE — Progress Notes (Signed)
 eLink Physician-Brief Progress Note Patient Name: Jenny White DOB: Jan 14, 1946 MRN: [id number]   Date of Service  11/23/2023  HPI/Events of Note  Patient is screaming and crying. Delirious.  eICU Interventions  Add Zyprexa x1, melatonin x1     Intervention Category Minor Interventions: Routine modifications to care plan (e.g. PRN medications for pain, fever);Agitation / anxiety - evaluation and management  Dax Murguia 11/23/2023, 10:48 PM

## 2023-11-23 NOTE — Progress Notes (Signed)
 Alerted MD that patient was tachycardic (HR 120s-130s) despite amiodarone gtt at 30mg .  MD is aware.  No new orders given.

## 2023-11-23 NOTE — Progress Notes (Signed)
 PHARMACY - TOTAL PARENTERAL NUTRITION CONSULT NOTE   Indication:  Ischemic bowel s/p small bowel resection, end ileostomy  Patient Measurements: Height: 5' 7.01" (170.2 cm) Weight: 59 kg (130 lb 1.1 oz) IBW/kg (Calculated) : 61.62 TPN AdjBW (KG): 54.4 Body mass index is 20.37 kg/m. Usual Weight: 58 kg   Assessment:  78 year old woman with hx lung adenocarcinoma, prior splenectomy among others presented to the emergency room with complaints of abdominal pain with N/V for the past week. She states the abdominal pain got worsened after dinner. She reported experiencing nausea and vomiting this morning. CT scan showed high grade small bowel obstruction with transition point in the right lower quadrant along the distal ileum. Some trace of ascites and mesenteric stranding. Concern for bowel wall ischemia. Broad spectrum antibiotics (zosyn) was started, blood cultures pending. Remains on pressor support and enteral nutrition now on hold. Pharmacy consulted to initiate TPN.   3/10 BMP glucose and finger sticks are significantly different. Patient does not have hx of diabetes so would not expect BG to be in the 400s on TPN. Repeat labs 3/10 after holding TPN for 5 min and flushing well shows improvement in BG and now matching POC glucose. Labs with high glucose were likely contaminated with TPN.  Glucose / Insulin: A1c 5.8%, CBGs 107-134 (on hydrocortisone), 3 units SSI in last 24h Electrolytes: Na up 147, K down 3.7, CL 112, CO2 27, iCa 1.3/CoCa 10.3 (none in TPN), Mg up 3.4, Phos 3.1, others wnl Renal:  Scr 0.74 down (bsl ~1), BUN 47 Hepatic: alb 2.2, Alk phos/AST/ALT/Tbili wnl, TG 75 Intake / Output; MIVF:  UOP 1.1 mL/kg/hr, ostomy 700 mL  GI Imaging:  3/4 CT - high-grade SBO is with transition point in the RLQ along the distal ileum, cannot exclude bowel wall ischemia; distended esophagus 3/4 KUB: Dilated fluid-filled loops of bowel  GI Surgeries / Procedures:  3/4 ex lap, small bowel  resection, open abdomen with bowel in discontinuity  3/6 small bowel resection, end ileostomy, closure of abdomen 3/14 Cortrak placed  Central access: PICC 11/12/23 TPN start date: 11/14/2023  Nutritional Goals: Goal TPN rate is 55 mL/hr (provides 86 g of protein and 1653 kcals per day)  RD Assessment: Estimated Needs Total Energy Estimated Needs: 1850-2050 Total Protein Estimated Needs: 85-105g Total Fluid Estimated Needs: >/= 2L  Current Nutrition:  NPO and Tube feeding + TPN Vital 1.5 @ 20 ml/hr (goal 50 ml/hr)  Plan:  Continue TPN at goal 50 mL/hr at 1800 to meet 100% goal kcal and AA (decreased rate 3/13 given less volume needed with removal of lytes, but still meeting goal) Electrolytes in TPN: decrease Na 0 mEq/L, decrease K 30 mEq/L, Ca 0 mEq/L, decrease Mg 0 mEq/L, increase Phos 15 mmol/L. Cl:Ac max Ac Add standard MVI and trace elements to TPN Add sensitive SSI q6h while on stress steroids Monitor TPN labs on Mon/Thurs, daily as needed  F/U ability to increase TF to goal F/u lytes with diuresis  KCl 40 mEq per tube x1  Thank you for involving pharmacy in this patient's care.  Loura Back, PharmD, BCPS Clinical Pharmacist Clinical phone for 11/23/2023 is [phone] 11/23/2023 10:01 AM

## 2023-11-23 NOTE — Progress Notes (Deleted)
 PHARMACY - ANTICOAGULATION CONSULT NOTE  Pharmacy Consult for heparin Indication: atrial fibrillation  No Known Allergies  Patient Measurements: Height: 5' 7.01" (170.2 cm) Weight: 59 kg (130 lb 1.1 oz) IBW/kg (Calculated) : 61.62 Heparin Dosing Weight: 49.6 kg   Vital Signs: Temp: 100.9 F (38.3 C) (03/16 0415) Temp Source: Axillary (03/16 0415) BP: 134/46 (03/16 0700) Pulse Rate: 69 (03/16 0700)  Labs: Recent Labs    11/20/23 1129 11/20/23 2102 11/21/23 0707 11/22/23 0535 11/22/23 0537  HGB 9.6*  --  10.0* 9.1*  --   HCT 31.0*  --  32.7* 29.3*  --   PLT  --   --  464* 475*  --   HEPARINUNFRC <0.10* 0.43 0.46  --  0.26*  CREATININE 0.80  --  0.98 0.87  --     Estimated Creatinine Clearance: 49.6 mL/min (by C-G formula based on SCr of 0.87 mg/dL).  Assessment: 36 yof presenting with abdominal pain now s/p ex-lap small bowel resection followed by small bowel resection, ileostomy and closure. Has been going in/out of Afib (no AC PTA). Was on subQ heparin for DVT ppx (LD 3/11@0516 ).   Heparin level subtherapeutic at 0.26, on heparin infusion at 1150 units/hr. No signs/symptoms of bleeding or infusion issues per RN. Confirmed drawn from opposite side of where heparin running central line. Hgb stable 9-10s, platelets elevated.  Goal of Therapy:  Heparin level 0.3-0.5 units/ml Monitor platelets by anticoagulation protocol: Yes   Plan:  Increase heparin infusion to 1200 units/hr Daily heparin level, CBC  Monitor for signs/symptoms of bleeding  Thank you for involving pharmacy in this patient's care.  Loura Back, PharmD, BCPS Clinical Pharmacist Clinical phone for 11/23/2023 is [phone] 11/23/2023 7:19 AM

## 2023-11-23 NOTE — Procedures (Signed)
 Extubation Procedure Note  Patient Details:   Name: Jenny White DOB: September 30, 1945 MRN: [id number]   Airway Documentation:    Vent end date: 11/23/23 Vent end time: 0946   Evaluation  O2 sats: stable throughout Complications: No apparent complications Patient did tolerate procedure well. Bilateral Breath Sounds: Rhonchi, Diminished   Yes, pt extubated successfully to 10 l/m [place].  Audrie Lia 11/23/2023, 9:47 AM

## 2023-11-23 NOTE — Progress Notes (Addendum)
 PHARMACY - ANTICOAGULATION CONSULT NOTE  Pharmacy Consult for heparin Indication: atrial fibrillation  No Known Allergies  Patient Measurements: Height: 5' 7.01" (170.2 cm) Weight: 59 kg (130 lb 1.1 oz) IBW/kg (Calculated) : 61.62 Heparin Dosing Weight: 49.6 kg   Vital Signs: Temp: 98.8 F (37.1 C) (03/16 0800) Temp Source: Axillary (03/16 0800) BP: 126/42 (03/16 0800) Pulse Rate: 85 (03/16 0800)  Labs: Recent Labs    11/21/23 0707 11/22/23 0535 11/22/23 0537 11/23/23 0848  HGB 10.0* 9.1*  --  9.4*  HCT 32.7* 29.3*  --  30.9*  PLT 464* 475*  --  544*  HEPARINUNFRC 0.46  --  0.26* 0.37  CREATININE 0.98 0.87  --  0.74    Estimated Creatinine Clearance: 54 mL/min (by C-G formula based on SCr of 0.74 mg/dL).  Assessment: 38 yof presenting with abdominal pain now s/p ex-lap small bowel resection followed by small bowel resection, ileostomy and closure. Has been going in/out of Afib (no AC PTA). Was on subQ heparin for DVT ppx (LD 3/11@0516 ).   Heparin level therapeutic at 0.37, on heparin infusion at 1200 units/hr. No bleeding noted. Hgb stable 9-10s, platelets elevated.  Goal of Therapy:  Heparin level 0.3-0.5 units/ml Monitor platelets by anticoagulation protocol: Yes   Plan:  Continue heparin infusion at 1200 units/hr Daily heparin level, CBC  Monitor for signs/symptoms of bleeding  Thank you for involving pharmacy in this patient's care.  Loura Back, PharmD, BCPS Clinical Pharmacist Clinical phone for 11/23/2023 is [phone] 11/23/2023 9:43 AM   Addendum: Consulted to transition to enoxaparin.  Stop heparin drip Enoxaparin 60 mg SQ q12h Plan to change to DOAC in future  [facility], PharmD, BCPS 11:54 AM

## 2023-11-23 NOTE — Progress Notes (Signed)
 NAME:  Jenny White, MRN:  [id number], DOB:  01-07-1946, LOS: 12 ADMISSION DATE:  11/11/2023 CONSULTATION DATE:  11/11/2023 REFERRING MD:  Dwain Sarna - CCS CHIEF COMPLAINT: Shock  History of Present Illness:  Jenny White 78 year old woman with a pmhx significant for COPD, GERD, nephrolithiasis , HTN, OSA, Lung cancer ( Adenocarcinoma, diagnosed 05/2023) who presented to the emergency room with complaints of abdominal pain with N/V for the past week. She states the abdominal  pain got worsened after dinner. She reported experiencing nausea and  vomiting this morning.   In the ED  her labs were significant for a lactate 4.1 After fluid resuscitation, Lactate decreased to 2.5. CT scan showed high grade small bowel obstruction with transition point in the right lower quadrant along the distal ileum. Some trace of ascites and mesenteric stranding.Concern for bowel wall ischemia. Broad spectrum antibiotics ( zosyn) was started, blood cultures pending.   Patient was taken to the ER emergently with CCS 3/4 for Ex-lap / LOA. Intraoperative course  was notable for free fluid on entry, with closed loop bowel obstruction noted secondary to tight band adhesion. Patient left in discontinuity/ anastomosis not completed with open abdomen and plan for second look in 24-48 hrs. Patient remained intubated and was transferred to ICU in critical condition.   Pertinent Medical History:   Past Medical History:  Diagnosis Date   Arthritis    in back   COPD (chronic obstructive pulmonary disease) (HCC)    Difficult airway    Due to limited oral opening. Elective glidescope used previously.   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 Elevated LA found to have SBO taken emergently to surgery for EX-LAP.  3/7 extubated reintubated 3/10 extubated again 3/11 OFF NIPPV 3/12 got ativan at HS. Sleepy and difficult to arouse all day. Stat ABG. Stopping ativan  3/13 transferred to ICU for obtundation. Re-intubated.   Interim History / Subjective:   Has passed SBT for 2 days in a row.  Endorses anxiety.  Objective:  Blood pressure (!) 145/59, pulse (!) 103, temperature 98.8 F (37.1 C), temperature source Axillary, resp. rate (!) 26, height 5' 7.01" (1.702 m), weight 59 kg, SpO2 96%.    Vent Mode: PSV;CPAP FiO2 (%):  [40 %] 40 % Set Rate:  [26 bmp] 26 bmp Vt Set:  [490 mL] 490 mL PEEP:  [5 cmH20-6 cmH20] 5 cmH20 Pressure Support:  [10 cmH20] 10 cmH20 Plateau Pressure:  [22 cmH20] 22 cmH20   Intake/Output Summary (Last 24 hours) at 11/23/2023 1123 Last data filed at 11/23/2023 1045 Gross per 24 hour  Intake 3637.59 ml  Output 3440 ml  Net 197.59 ml   Filed Weights   11/21/23 0430 11/22/23 0500 11/23/23 0456  Weight: 55.8 kg 58.2 kg 59 kg   Physical Examination: General elderly female in NAD on vent HENT OG tube ET tube in place Pulm chest now clear to auscultation bilaterally.  RSBI mid 40s.  Mild accessory muscle use. Card heart sounds are unremarkable.  Remains in rate  controlled atrial fibrillation Abd mildly distended soft.  Intact midline incision. Ext no pitting edema.  Generalized loss of muscle mass. Neuro: Spontaneously awake, alert, follows commands.  Tremor has resolved.  Ancillary tests personally reviewed:  Mild hyponatremia 147 Mild multifactorial anemia. Persistent leukocytosis 22.7 KUB shows small bore feeding tube tip past the pylorus likely the third part of the duodenum. Assessment & Plan:   Acute on chronic resp failure with hypoxia, hypercarbia HAP:  Aeromonas and Hafnia Alvei  pneumonia > both S to meropenmem Likely a component of aspiration as well.  Chronic COPD advanced Shock now resolved Acute toxic metabolic encephalopathy now resolved High grade small bowel obstruction s/p ex lap 3/4 with resection and end ileostomy Atrial fib AKI: Resolved Severe protein calorie malnutrition DM2 RLL Cancer s/p SBRT  Plan:  -Extubated today -Continue current bronchodilator regimen.  Diurese today -Respiratory failure multifactorial due to COPD, HCAP and generalized deconditioning following surgery with malnutrition.   -Started trickle feeds via postpyloric tube.  Continue TPN for now to ensure adequate nutrition given degree of compromise.  If tolerates extubation, gradually advance tube feeds tomorrow. -Switch IV heparin to Lovenox.  Will eventually need NOAC for stroke prevention.  - Continue amiodarone IV until patient is successfully extubated.  Would avoid long-term amiodarone use given chronic lung disease. -Current glycemic control is adequate. -Complete 5 days of antibiotics for aspiration pneumonia. -Stop stress steroids. -Titrate norepinephrine to keep MAP greater than 65.  Should be able to wean off once extubated off dexmedetomidine.   Best Practice: (right click and "Reselect all SmartList Selections" daily)   Diet/type: TPN, trickle feeds DVT prophylaxis: heparin gtt GI prophylaxis: PPI Lines: PICC Foley:  Yes, and it is still needed Code Status:  full code Last date of multidisciplinary goals of care discussion:   CRITICAL CARE Performed by: Lynnell Catalan   Total critical care time: 40 minutes  Critical care time was exclusive of separately billable procedures and treating other patients.  Critical care was necessary to treat or prevent imminent or life-threatening deterioration.  Critical care was time spent personally by me on the following activities: development of treatment plan with patient and/or surrogate as well as nursing,  discussions with consultants, evaluation of patient's response to treatment, examination of patient, obtaining history from patient or surrogate, ordering and performing treatments and interventions, ordering and review of laboratory studies, ordering and review of radiographic studies, pulse oximetry, re-evaluation of patient's condition and participation in multidisciplinary rounds.  Lynnell Catalan, MD [facility] ICU Physician [facility] [place] [facility]  Pager: [phone] Mobile: [phone] After hours: [phone].

## 2023-11-23 NOTE — Progress Notes (Signed)
   Progress Note  10 Days Post-Op  Subjective: Intubated, sedated. Ostomy output decreased Cortrak is in the stomach, unsuccessful at post-pyloric placement. Has bee started on reglan. No family at bedside this morning.  Objective: Vital signs in last 24 hours: Temp:  [98.8 F (37.1 C)-101.3 F (38.5 C)] 98.8 F (37.1 C) (03/16 0800) Pulse Rate:  [59-100] 85 (03/16 0800) Resp:  [13-30] 20 (03/16 0800) BP: (105-148)/(38-79) 126/42 (03/16 0800) SpO2:  [95 %-99 %] 97 % (03/16 0800) FiO2 (%):  [40 %] 40 % (03/16 0753) Weight:  [59 kg] 59 kg (03/16 0456) Last BM Date : 11/23/23  Intake/Output from previous day: 03/15 0701 - 03/16 0700 In: 3710.7 [I.V.:3153.7; NG/GT:357; IV Piggyback:200] Out: 2575 [Urine:1875; Stool:700] Intake/Output this shift: Total I/O In: 155.5 [I.V.:135.5; NG/GT:20] Out: 80 [Urine:80]  PE: General: intubated, sedated, eyes are open Heart: RRR Lungs: ventilated respirations Abd: soft, minimally distended, ileostomy productive of liquid dark green stool. Midline incision clean and dry with staples in place  Lab Results:  Recent Labs    11/22/23 0535 11/23/23 0848  WBC 23.9* 22.7*  HGB 9.1* 9.4*  HCT 29.3* 30.9*  PLT 475* 544*   BMET Recent Labs    11/21/23 0707 11/22/23 0535  NA 148* 145  K 4.3 4.3  CL 112* 111  CO2 26 27  GLUCOSE 162* 138*  BUN 50* 48*  CREATININE 0.98 0.87  CALCIUM 9.1 8.8*   PT/INR No results for input(s): "LABPROT", "INR" in the last 72 hours. CMP     Component Value Date/Time   NA 145 11/22/2023 0535   K 4.3 11/22/2023 0535   CL 111 11/22/2023 0535   CO2 27 11/22/2023 0535   GLUCOSE 138 (H) 11/22/2023 0535   BUN 48 (H) 11/22/2023 0535   CREATININE 0.87 11/22/2023 0535   CREATININE 0.68 10/15/2023 1137   CALCIUM 8.8 (L) 11/22/2023 0535   PROT 4.9 (L) 11/20/2023 0630   ALBUMIN 2.2 (L) 11/20/2023 0630   AST 39 11/20/2023 0630   AST 20 10/15/2023 1137   ALT 38 11/20/2023 0630   ALT 15 10/15/2023 1137    ALKPHOS 120 11/20/2023 0630   BILITOT 0.3 11/20/2023 0630   BILITOT 0.4 10/15/2023 1137   GFRNONAA >60 11/22/2023 0535   GFRNONAA >60 10/15/2023 1137   Lipase     Component Value Date/Time   LIPASE 44 11/11/2023 1127      Assessment/Plan POD 11/9 S/P ex-lap small bowel resection, left open; ex-lap, small bowel resection, ileostomy and closure, Dr. Dwain Sarna for SBO - Patient transferred to ICU 3/14 and re-intubated.  - cortrak now appears post-pyloric. Appears to be so on Abd XR and therefore now on tube feeds. D/C Reglan - On meropenem for treatment of aspiration pneumonia. Does not need antibiotics for intraabdominal coverage. - VTE: on heparin gtt for a-fib - Remainder of care per CCM. Surgery will continue to follow.   LOS: 12 days   Marin Olp, MD [facility], A [facility]

## 2023-11-24 ENCOUNTER — Inpatient Hospital Stay (HOSPITAL_COMMUNITY)

## 2023-11-24 DIAGNOSIS — J9622 Acute and chronic respiratory failure with hypercapnia: Secondary | ICD-10-CM | POA: Diagnosis not present

## 2023-11-24 DIAGNOSIS — I959 Hypotension, unspecified: Secondary | ICD-10-CM | POA: Diagnosis not present

## 2023-11-24 DIAGNOSIS — E870 Hyperosmolality and hypernatremia: Secondary | ICD-10-CM | POA: Diagnosis not present

## 2023-11-24 DIAGNOSIS — J9621 Acute and chronic respiratory failure with hypoxia: Secondary | ICD-10-CM | POA: Diagnosis not present

## 2023-11-24 LAB — POCT I-STAT 7, (LYTES, BLD GAS, ICA,H+H)
Acid-Base Excess: 4 mmol/L — ABNORMAL HIGH (ref 0.0–2.0)
Acid-Base Excess: 5 mmol/L — ABNORMAL HIGH (ref 0.0–2.0)
Bicarbonate: 32.9 mmol/L — ABNORMAL HIGH (ref 20.0–28.0)
Bicarbonate: 31.6 mmol/L — ABNORMAL HIGH (ref 20.0–28.0)
Calcium, Ion: 1.43 mmol/L — ABNORMAL HIGH (ref 1.15–1.40)
Calcium, Ion: 1.37 mmol/L (ref 1.15–1.40)
HCT: 31 % — ABNORMAL LOW (ref 36.0–46.0)
HCT: 30 % — ABNORMAL LOW (ref 36.0–46.0)
Hemoglobin: 10.5 g/dL — ABNORMAL LOW (ref 12.0–15.0)
Hemoglobin: 10.2 g/dL — ABNORMAL LOW (ref 12.0–15.0)
O2 Saturation: 96 %
O2 Saturation: 98 %
Patient temperature: 99.5
Patient temperature: 99.8
Potassium: 4.6 mmol/L (ref 3.5–5.1)
Potassium: 4.4 mmol/L (ref 3.5–5.1)
Sodium: 148 mmol/L — ABNORMAL HIGH (ref 135–145)
Sodium: 148 mmol/L — ABNORMAL HIGH (ref 135–145)
TCO2: 35 mmol/L — ABNORMAL HIGH (ref 22–32)
TCO2: 33 mmol/L — ABNORMAL HIGH (ref 22–32)
pCO2 arterial: 79.4 mmHg (ref 32–48)
pCO2 arterial: 60.6 mmHg — ABNORMAL HIGH (ref 32–48)
pH, Arterial: 7.228 — ABNORMAL LOW (ref 7.35–7.45)
pH, Arterial: 7.328 — ABNORMAL LOW (ref 7.35–7.45)
pO2, Arterial: 100 mmHg (ref 83–108)
pO2, Arterial: 111 mmHg — ABNORMAL HIGH (ref 83–108)

## 2023-11-24 LAB — CBC
HCT: 30.9 % — ABNORMAL LOW (ref 36.0–46.0)
Hemoglobin: 9 g/dL — ABNORMAL LOW (ref 12.0–15.0)
MCH: 29.6 pg (ref 26.0–34.0)
MCHC: 29.1 g/dL — ABNORMAL LOW (ref 30.0–36.0)
MCV: 101.6 fL — ABNORMAL HIGH (ref 80.0–100.0)
Platelets: 534 10*3/uL — ABNORMAL HIGH (ref 150–400)
RBC: 3.04 MIL/uL — ABNORMAL LOW (ref 3.87–5.11)
RDW: 19.1 % — ABNORMAL HIGH (ref 11.5–15.5)
WBC: 22.9 10*3/uL — ABNORMAL HIGH (ref 4.0–10.5)
nRBC: 0.2 % (ref 0.0–0.2)

## 2023-11-24 LAB — COMPREHENSIVE METABOLIC PANEL
ALT: 116 U/L — ABNORMAL HIGH (ref 0–44)
AST: 37 U/L (ref 15–41)
Albumin: 2 g/dL — ABNORMAL LOW (ref 3.5–5.0)
Alkaline Phosphatase: 102 U/L (ref 38–126)
Anion gap: 7 (ref 5–15)
BUN: 60 mg/dL — ABNORMAL HIGH (ref 8–23)
CO2: 29 mmol/L (ref 22–32)
Calcium: 9 mg/dL (ref 8.9–10.3)
Chloride: 114 mmol/L — ABNORMAL HIGH (ref 98–111)
Creatinine, Ser: 0.73 mg/dL (ref 0.44–1.00)
GFR, Estimated: >60 mL/min (ref >60)
Glucose, Bld: 132 mg/dL — ABNORMAL HIGH (ref 70–99)
Potassium: 3.8 mmol/L (ref 3.5–5.1)
Sodium: 150 mmol/L — ABNORMAL HIGH (ref 135–145)
Total Bilirubin: 0.5 mg/dL (ref 0.0–1.2)
Total Protein: 4.6 g/dL — ABNORMAL LOW (ref 6.5–8.1)

## 2023-11-24 LAB — GLUCOSE, CAPILLARY
Glucose-Capillary: 107 mg/dL — ABNORMAL HIGH (ref 70–99)
Glucose-Capillary: 107 mg/dL — ABNORMAL HIGH (ref 70–99)
Glucose-Capillary: 117 mg/dL — ABNORMAL HIGH (ref 70–99)
Glucose-Capillary: 131 mg/dL — ABNORMAL HIGH (ref 70–99)

## 2023-11-24 LAB — MAGNESIUM: Magnesium: 2 mg/dL (ref 1.7–2.4)

## 2023-11-24 LAB — PHOSPHORUS: Phosphorus: 4.1 mg/dL (ref 2.5–4.6)

## 2023-11-24 LAB — TRIGLYCERIDES: Triglycerides: 99 mg/dL (ref <150)

## 2023-11-24 MED ORDER — POTASSIUM CHLORIDE 20 MEQ PO PACK
20.0000 meq | PACK | Freq: Once | ORAL | Status: AC
  Administered 2023-11-24: 20 meq
  Filled 2023-11-24: qty 1

## 2023-11-24 MED ORDER — ETOMIDATE 2 MG/ML IV SOLN
INTRAVENOUS | Status: AC
  Administered 2023-11-24: 20 mg
  Filled 2023-11-24: qty 20

## 2023-11-24 MED ORDER — FENTANYL BOLUS VIA INFUSION
25.0000 ug | INTRAVENOUS | Status: DC | PRN
  Administered 2023-11-24 (×2): 75 ug via INTRAVENOUS
  Administered 2023-11-24 (×2): 50 ug via INTRAVENOUS
  Administered 2023-11-25 (×5): 100 ug via INTRAVENOUS

## 2023-11-24 MED ORDER — MIDAZOLAM HCL 2 MG/2ML IJ SOLN
INTRAMUSCULAR | Status: AC
  Filled 2023-11-24: qty 2

## 2023-11-24 MED ORDER — FENTANYL CITRATE PF 50 MCG/ML IJ SOSY
PREFILLED_SYRINGE | INTRAMUSCULAR | Status: AC
  Administered 2023-11-24: 100 ug
  Filled 2023-11-24: qty 2

## 2023-11-24 MED ORDER — GABAPENTIN 250 MG/5ML PO SOLN
100.0000 mg | Freq: Two times a day (BID) | ORAL | Status: DC
  Administered 2023-11-24 – 2023-12-11 (×33): 100 mg
  Filled 2023-11-24 (×35): qty 2

## 2023-11-24 MED ORDER — FENTANYL 2500MCG IN NS 250ML (10MCG/ML) PREMIX INFUSION
25.0000 ug/h | INTRAVENOUS | Status: DC
  Administered 2023-11-24: 25 ug/h via INTRAVENOUS
  Administered 2023-11-25: 125 ug/h via INTRAVENOUS
  Filled 2023-11-24 (×2): qty 250

## 2023-11-24 MED ORDER — LACTATED RINGERS IV BOLUS
500.0000 mL | Freq: Once | INTRAVENOUS | Status: AC
Start: 2023-11-24 — End: 2023-11-24
  Administered 2023-11-24: 500 mL via INTRAVENOUS

## 2023-11-24 MED ORDER — GABAPENTIN 250 MG/5ML PO SOLN
100.0000 mg | Freq: Two times a day (BID) | ORAL | Status: DC
Start: 2023-11-24 — End: 2023-11-24
  Administered 2023-11-24: 100 mg via ORAL
  Filled 2023-11-24 (×2): qty 2

## 2023-11-24 MED ORDER — FREE WATER
200.0000 mL | Freq: Four times a day (QID) | Status: DC
  Administered 2023-11-24 – 2023-11-30 (×23): 200 mL

## 2023-11-24 MED ORDER — FENTANYL CITRATE PF 50 MCG/ML IJ SOSY: 25.0000 ug | PREFILLED_SYRINGE | Freq: Once | INTRAMUSCULAR | Status: DC

## 2023-11-24 MED ORDER — BUSPIRONE HCL 5 MG PO TABS
5.0000 mg | ORAL_TABLET | Freq: Two times a day (BID) | ORAL | Status: DC
  Administered 2023-11-24 – 2023-11-30 (×12): 5 mg
  Filled 2023-11-24 (×13): qty 1

## 2023-11-24 MED ORDER — BUDESONIDE 0.5 MG/2ML IN SUSP
0.5000 mg | Freq: Two times a day (BID) | RESPIRATORY_TRACT | Status: DC
  Administered 2023-11-24 – 2023-12-11 (×34): 0.5 mg via RESPIRATORY_TRACT
  Filled 2023-11-24 (×34): qty 2

## 2023-11-24 MED ORDER — INFUVITE ADULT IV SOLN
INTRAVENOUS | Status: AC
  Filled 2023-11-24: qty 576

## 2023-11-24 NOTE — Procedures (Signed)
 Intubation Procedure Note  Jenny White  [id number]  1946/07/21  Date:11/24/23  Time:12:26 PM   Provider Performing:Britteny Fiebelkorn V. Tammie Yanda    Procedure: Intubation (31500)  Indication(s) Respiratory Failure  Consent Risks of the procedure as well as the alternatives and risks of each were explained to the patient and/or caregiver.  Consent for the procedure was obtained and is signed in the bedside chart   Anesthesia Etomidate and Fentanyl   Time Out Verified patient identification, verified procedure, site/side was marked, verified correct patient position, special equipment/implants available, medications/allergies/relevant history reviewed, required imaging and test results available.   Sterile Technique Usual hand hygeine, masks, and gloves were used   Procedure Description Patient positioned in bed supine.  Sedation given as noted above.  Patient was intubated with endotracheal tube using Glidescope.  View was Grade 1 full glottis .  Number of attempts was 1.  Colorimetric CO2 detector was consistent with tracheal placement.   Complications/Tolerance None; patient tolerated the procedure well. Chest X-ray is ordered to verify placement.   EBL Minimal   Specimen(s) None   Jenny White V. Vassie Loll MD

## 2023-11-24 NOTE — Progress Notes (Signed)
 The Critical Care team from [facility] called to say they do not have a bed for Ms. Dokes. There is a patient at [facility] who needs the bed. As patient request was for family dis-satisfaction vs need for higher level of care, the bed will go to the patient at [facility]. They suggested we call [facility] to see if they could make room for the patient. I shared this with both the patient's nurse, and the Critical Care team here at [facility]. They will address in the morning if the family still request a transfer.   Bevelyn Ngo, MSN, AGACNP-BC [facility] [facility] Medicine See Amion for personal pager PCCM on call pager [phone]  11/24/2023 3:16 PM

## 2023-11-24 NOTE — Progress Notes (Signed)
   Progress Note  11 Days Post-Op  Subjective: Intubated, sedated. Ostomy output 860 mL  TF held this morning becayse patient had to be placed onbipap No family at bedside this morning.  Objective: Vital signs in last 24 hours: Temp:  [98 F (36.7 C)-99.5 F (37.5 C)] 99.5 F (37.5 C) (03/17 0800) Pulse Rate:  [79-122] 92 (03/17 1130) Resp:  [21-34] 34 (03/17 1130) BP: (89-141)/(31-68) 130/49 (03/17 1130) SpO2:  [87 %-100 %] 100 % (03/17 1130) FiO2 (%):  [50 %] 50 % (03/17 1015) Weight:  [53.9 kg] 53.9 kg (03/17 0500) Last BM Date : 11/24/23  Intake/Output from previous day: 03/16 0701 - 03/17 0700 In: 2353.4 [I.V.:1769.2; NG/GT:484.3; IV Piggyback:99.9] Out: 3700 [Urine:2840; Stool:860] Intake/Output this shift: Total I/O In: 394 [I.V.:344.3; NG/GT:49.7] Out: 170 [Urine:170]  PE: General: eyes open, appears uncomfortable Heart: RRR Lungs: on BiPAP Abd: soft, minimally distended, ileostomy productive of liquid brown stool. Midline incision clean and dry with staples in place no cellulitis   Lab Results:  Recent Labs    11/23/23 0848 11/24/23 0557 11/24/23 1133  WBC 22.7* 22.9*  --   HGB 9.4* 9.0* 10.5*  HCT 30.9* 30.9* 31.0*  PLT 544* 534*  --    BMET Recent Labs    11/23/23 0848 11/24/23 0557 11/24/23 1133  NA 147* 150* 148*  K 3.7 3.8 4.6  CL 112* 114*  --   CO2 27 29  --   GLUCOSE 142* 132*  --   BUN 47* 60*  --   CREATININE 0.74 0.73  --   CALCIUM 8.9 9.0  --    PT/INR No results for input(s): "LABPROT", "INR" in the last 72 hours. CMP     Component Value Date/Time   NA 148 (H) 11/24/2023 1133   K 4.6 11/24/2023 1133   CL 114 (H) 11/24/2023 0557   CO2 29 11/24/2023 0557   GLUCOSE 132 (H) 11/24/2023 0557   BUN 60 (H) 11/24/2023 0557   CREATININE 0.73 11/24/2023 0557   CREATININE 0.68 10/15/2023 1137   CALCIUM 9.0 11/24/2023 0557   PROT 4.6 (L) 11/24/2023 0557   ALBUMIN 2.0 (L) 11/24/2023 0557   AST 37 11/24/2023 0557   AST 20  10/15/2023 1137   ALT 116 (H) 11/24/2023 0557   ALT 15 10/15/2023 1137   ALKPHOS 102 11/24/2023 0557   BILITOT 0.5 11/24/2023 0557   BILITOT 0.4 10/15/2023 1137   GFRNONAA >60 11/24/2023 0557   GFRNONAA >60 10/15/2023 1137   Lipase     Component Value Date/Time   LIPASE 44 11/11/2023 1127      Assessment/Plan POD 12/10 S/P ex-lap small bowel resection, left open; ex-lap, small bowel resection, ileostomy and closure, Dr. Dwain Sarna for SBO - Patient transferred to ICU 3/14 and re-intubated.  - cortrak now appears post-pyloric. Was on TF but they were held for bipap, ok for tube feeds from CCS standpoint. - mgmt of respiratory failure per primary. We will follow  - VTE: on heparin gtt for a-fib - Remainder of care per CCM. Surgery will continue to follow.   LOS: 13 days   Hosie Spangle, [facility] Surgery Please see Amion for pager number during day hours 7:00am-4:30pm

## 2023-11-24 NOTE — Plan of Care (Signed)
   Problem: Nutrition: Goal: Adequate nutrition will be maintained Outcome: Progressing   Problem: Elimination: Goal: Will not experience complications related to bowel motility Outcome: Progressing   Problem: Pain Managment: Goal: General experience of comfort will improve and/or be controlled Outcome: Progressing   Problem: Safety: Goal: Ability to remain free from injury will improve Outcome: Progressing

## 2023-11-24 NOTE — Progress Notes (Addendum)
 PHARMACY - TOTAL PARENTERAL NUTRITION CONSULT NOTE   Indication:  Ischemic bowel s/p small bowel resection, end ileostomy  Patient Measurements: Height: 5' 7.01" (170.2 cm) Weight: 53.9 kg (118 lb 13.3 oz) IBW/kg (Calculated) : 61.62 TPN AdjBW (KG): 54.4 Body mass index is 18.61 kg/m. Usual Weight: 58 kg   Assessment:  78 year old woman with hx lung adenocarcinoma, prior splenectomy among others presented to the emergency room with complaints of abdominal pain with N/V for the past week. She states the abdominal pain got worsened after dinner. She reported experiencing nausea and vomiting this morning. CT scan showed high grade small bowel obstruction with transition point in the right lower quadrant along the distal ileum. Some trace of ascites and mesenteric stranding. Concern for bowel wall ischemia. Pharmacy consulted to initiate TPN.   3/17: Somnolent this AM and on BiPAP. Tube feeds turned off.   Glucose / Insulin: A1c 5.8%, CBGs 107-134 (off hydrocortisone), 4 units SSI in last 24h Electrolytes: Na up 150, K down 3.8, CL 114, CO2 29, CoCa 10.6 (none in TPN), Mg 2.0, Phos 4.1, others wnl Renal:  Scr 0.73 down (bsl ~1), BUN 60 Hepatic: alb 2.0, Alk phos/AST/ALT/Tbili wnl, TG 99 Intake / Output; MIVF:  UOP 2.2 mL/kg/hr, ostomy 860 mL  GI Imaging:  3/4 CT - high-grade SBO is with transition point in the RLQ along the distal ileum, cannot exclude bowel wall ischemia; distended esophagus 3/4 KUB: Dilated fluid-filled loops of bowel  3/15 KUB - No evidence of bowel obstruction.  GI Surgeries / Procedures:  3/4 ex lap, small bowel resection, open abdomen with bowel in discontinuity  3/6 small bowel resection, end ileostomy, closure of abdomen 3/14 Cortrak placed  Central access: PICC 11/12/23 TPN start date: 11/14/2023  Nutritional Goals: Goal TPN rate is 55 mL/hr (provides 86 g of protein and 1653 kcals per day)  RD Assessment: Estimated Needs Total Energy Estimated Needs:  1850-2050 Total Protein Estimated Needs: 85-105g Total Fluid Estimated Needs: >/= 2L  Current Nutrition:  NPO TPN  Plan:  Continue TPN at goal 50 mL/hr at 1800 to meet 100% goal kcal and AA (decreased rate 3/13 given less volume needed with removal of lytes, but still meeting goal) Electrolytes in TPN: Na 0 mEq/L, K 40 mEq/L, Ca 0 mEq/L, decrease Mg 0 mEq/L, Phos 15 mmol/L. Cl:Ac max Ac Add standard MVI and trace elements to TPN Stop SSI  Monitor TPN labs on Mon/Thurs, daily as needed  F/U ability to resume TF to goal F/u lytes with diuresis (last dose 3/16)  20 mEq per tube x1   Thank you for involving pharmacy in this patient's care.  Cedric Fishman, PharmD, BCPS, BCCCP Clinical Pharmacist

## 2023-11-24 NOTE — Progress Notes (Signed)
 NAME:  Jenny White, MRN:  [id number], DOB:  [birth year], LOS: 13 ADMISSION DATE:  11/11/2023 CONSULTATION DATE:  11/11/2023 REFERRING MD:  Jenny White - CCS CHIEF COMPLAINT: Shock  History of Present Illness:  Jenny White 78 year old woman with a pmhx significant for COPD, GERD, nephrolithiasis , HTN, OSA, Lung cancer ( Adenocarcinoma, diagnosed 05/2023) who presented to the emergency room with complaints of abdominal pain with N/V for the past week. She states the abdominal  pain got worsened after dinner. She reported experiencing nausea and  vomiting this morning.   In the ED  her labs were significant for a lactate 4.1 After fluid resuscitation, Lactate decreased to 2.5. CT scan showed high grade small bowel obstruction with transition point in the right lower quadrant along the distal ileum. Some trace of ascites and mesenteric stranding.Concern for bowel wall ischemia. Broad spectrum antibiotics ( zosyn) was started, blood cultures pending.   Patient was taken to the ER emergently with CCS 3/4 for Ex-lap / LOA. Intraoperative course  was notable for free fluid on entry, with closed loop bowel obstruction noted secondary to tight band adhesion. Patient left in discontinuity/ anastomosis not completed with open abdomen and plan for second look in 24-48 hrs. Patient remained intubated and was transferred to ICU in critical condition.   Pertinent Medical History:   Past Medical History:  Diagnosis Date   Arthritis    in back   COPD (chronic obstructive pulmonary disease) (HCC)    Difficult airway    Due to limited oral opening. Elective glidescope used previously.   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 Elevated LA found to have SBO taken emergently to surgery for EX-LAP.  3/7 extubated reintubated 3/10 extubated again 3/11 OFF NIPPV 3/12 got ativan at HS. Sleepy and difficult to arouse all day. Stat ABG. Stopping ativan  3/13 transferred to ICU for obtundation. Re-intubated.  3/16 Extubated  3/17 Obtunded , but protecting airways and sats are > 96%, however started on BiPAP and concern for need for reintubation if she does not improve  Interim History / Subjective:   Unresponsive except to deep pain after xyprexa 5 mg IM at 2345 on 3/16. Additionally Buspar and Gabapentin were also restarted 3/16.Saturations are 95%.  Started on BiPAP to support respirations 3/17 at 9 am Soft BP, started levophed to support MAP of > 65 mm Hg overnight 3/17   Objective:  Blood pressure (!) 141/46, pulse 91, temperature 99.5 F (37.5 C), temperature source Axillary, resp. rate (!) 30, height 5' 7.01" (1.702 m), weight 53.9 kg, SpO2 99%.    Vent Mode: PCV;BIPAP FiO2 (%):  [50 %] 50 % Set Rate:  [20 bmp] 20 bmp PEEP:  [5 cmH20] 5 cmH20   Intake/Output Summary (Last 24 hours) at 11/24/2023 1013 Last data filed at 11/24/2023 0941 Gross per 24 hour  Intake 2379.46 ml  Output 3790 ml  Net -1410.54 ml   Filed Weights   11/22/23 0500 11/23/23 0456 11/24/23 0500  Weight: 58.2 kg 59 kg 53.9  kg   Physical Examination: General elderly female extubated , obtunded, sats adequate on 10 L HFNC, but concern for airway protection , Cortrack in place Pulm Bilateral chest excursion. Diminished per right side,  Mild accessory muscle use. Card : S1, S2, Irr no RMG, Remains in rate controlled atrial fibrillation Abd: mildly distended soft.  Intact midline incision. Ext : trace  pitting edema.  Generalized loss of muscle mass. Cool to touch Neuro: Obtunded, following no commands, WD to deep painful  stimuli  Ancillary tests personally reviewed:  Triglycerides 99 Hypernatremia at 150 Cl 114 BUN 60/ Creatinine 0.73 Albumin of 2 ALT 116 Magnesium of 2.0/ Phos 4.1 Persistent WBC of 22.9 ( 22.7) HGB 9.0 ( 9.4) Platelets 534 CXR 3/17  result pending Last KUB done 3/15 shows small bore feeding tube tip past the pylorus likely the third part of the duodenum.  T Max 98.8 Net + 5,222.1 May need diuresis once BP can tolerate ( off pressors)  Assessment & Plan:   Acute on chronic resp failure with hypoxia, hypercarbia Extubated 3/16 Unresponsive 3/17 most likely 2/2 Zyprexa 3/16 HAP:  Aeromonas and Hafnia Alvei pneumonia > both S to meropenmem Likely a component of aspiration as well.  Chronic COPD advanced Hypotension on Levophed Acute toxic metabolic encephalopathy now resolved High grade small bowel obstruction s/p ex lap 3/4 with resection and end ileostomy Atrial fib AKI: Resolved Severe protein calorie malnutrition DM2 RLL Cancer s/p SBRT Hypernatremia  Plan:  -Extubated 3/16 -Continue current bronchodilator regimen.  Diuresed 3/16 -Respiratory failure multifactorial due to COPD, HCAP and generalized deconditioning following surgery with malnutrition. - Now unresponsive 3/17 after Zyprexa overnight x 1 dose>> started on BiPAP out of concern for hypercarbia, reassess in 2-3 hours , may need re-intubation .  - CXR 3/17  -Held  trickle 3/17 feeds via postpyloric tube while on BiPap.  -Continue TPN for now to ensure adequate nutrition given degree of compromise.  - Will resume trickle feeds if AMS improves, and respiratory status improves and can come off BiPAP. -Switch IV heparin to Lovenox.  Will eventually need NOAC for stroke prevention.  - Continue amiodarone IV for now as respiratory status is concerning.   - Would avoid long-term amiodarone use given chronic lung disease. - Current glycemic control is adequate. - Complete 5 days of antibiotics for aspiration  pneumonia. -Stress steroids stopped 3/16 -Titrate levophed to keep MAP greater than 65.  - Will check BNP to help guide need for diuresis ( EF 60-65%) - Will add free water per Cortrack once off Bipap to address Na of 150.  Of note, half life of Zypreza is 30 hours. Patient's Buspar and Gabapentin were also resumed 3/16. Both have been discontinued for now. Please resume both 3/18 if patient is awake to prevent withdrawal.   Best Practice: (right click and "Reselect all SmartList Selections" daily)   Diet/type: TPN, trickle feeds>> on hold 3/17 DVT prophylaxis: heparin gtt GI prophylaxis: PPI Lines: PICC Foley:  Yes, and it is still needed Code Status:  full code Last date of multidisciplinary goals of care discussion:   CRITICAL CARE Performed by: Bevelyn Ngo   Total critical care time:  50 minutes  Critical care time was exclusive of separately billable procedures and treating other patients.  Critical care was necessary to treat or prevent imminent or life-threatening deterioration.  Critical care was time spent personally by me on the following activities: development of treatment plan with patient and/or surrogate as well as nursing,  discussions with consultants, evaluation of patient's response to treatment, examination of patient, obtaining history from patient or surrogate, ordering and performing treatments and interventions, ordering and review of laboratory studies, ordering and review of radiographic studies, pulse oximetry, re-evaluation of patient's condition and participation in multidisciplinary rounds.  Bevelyn Ngo, MSN, AGACNP-BC [facility] See Amion for personal pager PCCM on call pager [phone]  11/24/2023 10:14 AM

## 2023-11-24 NOTE — Progress Notes (Addendum)
 PT Cancellation Note  Patient Details Name: Jenny White MRN: [id number] DOB: [birth year]   Cancelled Treatment:    Reason Eval/Treat Not Completed: Medical issues which prohibited therapy (Discussed with RN; pt hypotensive)  Addendum 12:59: Pt now re-intubated. Will sign off at this time. Please re-consult when status changes.  Lillia Pauls, PT, DPT Acute Rehabilitation Services Office [phone]    Norval Morton 11/24/2023, 9:55 AM

## 2023-11-24 NOTE — Progress Notes (Signed)
 Upon arrival to unit RN observed that patient was hypotensive.  Night shift RN had given Zyprexa during the night and patient was very sedated.  Night shift RN spoke with [facility] and was told to turn on IV levophed.  This RN will administer and titrate levophed accordingly.

## 2023-11-24 NOTE — Progress Notes (Signed)
 Occupational Therapy Discharge Patient Details Name: Jenny White MRN: [id number] DOB: [birth year] Today's Date: 11/24/2023 Time:  -     Patient discharged from OT services secondary to medical decline - will need to re-order OT to resume therapy services.  Please see latest therapy progress note for current level of functioning and progress toward goals.    Progress and discharge plan discussed with patient and/or caregiver: Patient unable to participate in discharge planning and no caregivers available The notes appear that possible hospital transfer so will need reorder for OT at new facility   GO     [name] 11/24/2023, 1:21 PM

## 2023-11-24 NOTE — Progress Notes (Signed)
 OT Cancellation Note  Patient Details Name: Jenny White MRN: [id number] DOB: [birth year]   Cancelled Treatment:    Reason Eval/Treat Not Completed: Patient not medically ready (noted to be hypotensive and sedated)  [name] 11/24/2023, 9:49 AM

## 2023-11-24 NOTE — Progress Notes (Signed)
 Alerted MD Vassie Loll that patients UOP is only 200 mLs for ten hours.  Was given orders for 500cc bolus of LR.   Will administer and continue to assess accordingly.

## 2023-11-24 NOTE — Progress Notes (Signed)
 I have called the [facility] at Dr. Reginia Naas request. I have provided all information requested. Reason for transfer is family are not satisfied with the care they are receiving at [facility]. Pt. Was re intubated for the fourth time today for hypercarbic respiratory failure.  It looks like they do have a Medical ICU oncology bed. They will call to confirm after Critical Care have evaluated her chart. Contact numbers have been provided.   Bevelyn Ngo, MSN, AGACNP-BC [facility] See Amion for personal pager PCCM on call pager [phone]  11/24/2023 1:15 PM

## 2023-11-25 DIAGNOSIS — A419 Sepsis, unspecified organism: Secondary | ICD-10-CM | POA: Diagnosis not present

## 2023-11-25 DIAGNOSIS — R6521 Severe sepsis with septic shock: Secondary | ICD-10-CM | POA: Diagnosis not present

## 2023-11-25 DIAGNOSIS — J9621 Acute and chronic respiratory failure with hypoxia: Secondary | ICD-10-CM | POA: Diagnosis not present

## 2023-11-25 DIAGNOSIS — J9622 Acute and chronic respiratory failure with hypercapnia: Secondary | ICD-10-CM | POA: Diagnosis not present

## 2023-11-25 LAB — CBC WITH DIFFERENTIAL/PLATELET
Abs Immature Granulocytes: 0.51 10*3/uL — ABNORMAL HIGH (ref 0.00–0.07)
Basophils Absolute: 0.1 10*3/uL (ref 0.0–0.1)
Basophils Relative: 0 %
Eosinophils Absolute: 0.1 10*3/uL (ref 0.0–0.5)
Eosinophils Relative: 0 %
HCT: 32.5 % — ABNORMAL LOW (ref 36.0–46.0)
Hemoglobin: 9.7 g/dL — ABNORMAL LOW (ref 12.0–15.0)
Immature Granulocytes: 2 %
Lymphocytes Relative: 4 %
Lymphs Abs: 1.1 10*3/uL (ref 0.7–4.0)
MCH: 29.5 pg (ref 26.0–34.0)
MCHC: 29.8 g/dL — ABNORMAL LOW (ref 30.0–36.0)
MCV: 98.8 fL (ref 80.0–100.0)
Monocytes Absolute: 1.8 10*3/uL — ABNORMAL HIGH (ref 0.1–1.0)
Monocytes Relative: 7 %
Neutro Abs: 22.6 10*3/uL — ABNORMAL HIGH (ref 1.7–7.7)
Neutrophils Relative %: 87 %
Platelets: 520 10*3/uL — ABNORMAL HIGH (ref 150–400)
RBC: 3.29 MIL/uL — ABNORMAL LOW (ref 3.87–5.11)
RDW: 19.1 % — ABNORMAL HIGH (ref 11.5–15.5)
Smear Review: NORMAL
WBC: 26.1 10*3/uL — ABNORMAL HIGH (ref 4.0–10.5)
nRBC: 0.5 % — ABNORMAL HIGH (ref 0.0–0.2)

## 2023-11-25 LAB — COMPREHENSIVE METABOLIC PANEL
ALT: 86 U/L — ABNORMAL HIGH (ref 0–44)
AST: 28 U/L (ref 15–41)
Albumin: 2.1 g/dL — ABNORMAL LOW (ref 3.5–5.0)
Alkaline Phosphatase: 103 U/L (ref 38–126)
Anion gap: 10 (ref 5–15)
BUN: 67 mg/dL — ABNORMAL HIGH (ref 8–23)
CO2: 25 mmol/L (ref 22–32)
Calcium: 8.7 mg/dL — ABNORMAL LOW (ref 8.9–10.3)
Chloride: 110 mmol/L (ref 98–111)
Creatinine, Ser: 0.77 mg/dL (ref 0.44–1.00)
GFR, Estimated: >60 mL/min (ref >60)
Glucose, Bld: 136 mg/dL — ABNORMAL HIGH (ref 70–99)
Potassium: 4.3 mmol/L (ref 3.5–5.1)
Sodium: 145 mmol/L (ref 135–145)
Total Bilirubin: 0.6 mg/dL (ref 0.0–1.2)
Total Protein: 5.2 g/dL — ABNORMAL LOW (ref 6.5–8.1)

## 2023-11-25 LAB — GLUCOSE, CAPILLARY
Glucose-Capillary: 107 mg/dL — ABNORMAL HIGH (ref 70–99)
Glucose-Capillary: 135 mg/dL — ABNORMAL HIGH (ref 70–99)
Glucose-Capillary: 136 mg/dL — ABNORMAL HIGH (ref 70–99)
Glucose-Capillary: 143 mg/dL — ABNORMAL HIGH (ref 70–99)

## 2023-11-25 LAB — MAGNESIUM: Magnesium: 1.8 mg/dL (ref 1.7–2.4)

## 2023-11-25 LAB — BRAIN NATRIURETIC PEPTIDE: B Natriuretic Peptide: 300.6 pg/mL — ABNORMAL HIGH (ref 0.0–100.0)

## 2023-11-25 LAB — PHOSPHORUS: Phosphorus: 2.8 mg/dL (ref 2.5–4.6)

## 2023-11-25 MED ORDER — FENTANYL CITRATE PF 50 MCG/ML IJ SOSY
25.0000 ug | PREFILLED_SYRINGE | INTRAMUSCULAR | Status: DC | PRN
  Administered 2023-11-26 (×2): 50 ug via INTRAVENOUS
  Administered 2023-11-26 (×2): 100 ug via INTRAVENOUS
  Administered 2023-11-26: 50 ug via INTRAVENOUS
  Administered 2023-11-26: 100 ug via INTRAVENOUS
  Filled 2023-11-25: qty 1
  Filled 2023-11-25 (×3): qty 2
  Filled 2023-11-25 (×2): qty 1
  Filled 2023-11-25: qty 2

## 2023-11-25 MED ORDER — MELATONIN 5 MG PO TABS
5.0000 mg | ORAL_TABLET | Freq: Every evening | ORAL | Status: DC | PRN
  Administered 2023-11-25 – 2023-12-10 (×9): 5 mg
  Filled 2023-11-25 (×10): qty 1

## 2023-11-25 MED ORDER — MAGNESIUM SULFATE 2 GM/50ML IV SOLN
2.0000 g | Freq: Once | INTRAVENOUS | Status: AC
  Administered 2023-11-25: 2 g via INTRAVENOUS
  Filled 2023-11-25: qty 50

## 2023-11-25 MED ORDER — FUROSEMIDE 10 MG/ML IJ SOLN
40.0000 mg | Freq: Once | INTRAMUSCULAR | Status: AC
  Administered 2023-11-25: 40 mg via INTRAVENOUS
  Filled 2023-11-25: qty 4

## 2023-11-25 MED ORDER — HYDROCODONE-ACETAMINOPHEN 5-325 MG PO TABS
0.5000 | ORAL_TABLET | Freq: Three times a day (TID) | ORAL | Status: DC
  Administered 2023-11-25 (×2): 0.5
  Filled 2023-11-25 (×2): qty 1

## 2023-11-25 MED ORDER — SODIUM CHLORIDE 0.9 % IV SOLN
1.0000 g | Freq: Two times a day (BID) | INTRAVENOUS | Status: AC
  Administered 2023-11-25 – 2023-12-01 (×14): 1 g via INTRAVENOUS
  Filled 2023-11-25 (×14): qty 20

## 2023-11-25 MED ORDER — PROSOURCE TF20 ENFIT COMPATIBL EN LIQD
60.0000 mL | Freq: Every day | ENTERAL | Status: DC
  Administered 2023-11-25 – 2023-12-04 (×10): 60 mL
  Filled 2023-11-25 (×10): qty 60

## 2023-11-25 MED ORDER — STERILE WATER FOR INJECTION IV SOLN
INTRAVENOUS | Status: AC
  Filled 2023-11-25: qty 288

## 2023-11-25 MED ORDER — FAMOTIDINE 20 MG PO TABS
20.0000 mg | ORAL_TABLET | Freq: Every day | ORAL | Status: DC
  Administered 2023-11-25 – 2023-11-30 (×6): 20 mg
  Filled 2023-11-25 (×6): qty 1

## 2023-11-25 MED ORDER — VITAL 1.5 CAL PO LIQD
1000.0000 mL | ORAL | Status: DC
  Administered 2023-11-25 – 2023-11-26 (×4): 1000 mL

## 2023-11-25 NOTE — Progress Notes (Addendum)
 PHARMACY - TOTAL PARENTERAL NUTRITION CONSULT NOTE   Indication:  Ischemic bowel s/p small bowel resection, end ileostomy  Patient Measurements: Height: 5' 7.01" (170.2 cm) Weight: 53.3 kg (117 lb 8.1 oz) IBW/kg (Calculated) : 61.62 TPN AdjBW (KG): 54.4 Body mass index is 18.4 kg/m. Usual Weight: 58 kg   Assessment:  78 year old woman with hx lung adenocarcinoma, prior splenectomy among others presented to the emergency room with complaints of abdominal pain with N/V for the past week. She states the abdominal pain got worsened after dinner. She reported experiencing nausea and vomiting this morning. CT scan showed high grade small bowel obstruction with transition point in the right lower quadrant along the distal ileum. Some trace of ascites and mesenteric stranding. Concern for bowel wall ischemia. Pharmacy consulted to initiate TPN.   3/18: On mechanical ventilation receiving tube feeds via small bore feeding tube.    Glucose / Insulin: A1c 5.8%, CBGs 107-143 off SSI  Electrolytes: Na 145 (on FWF 200 mL q6h) , K 4.3, CL 110, CO2 25, CoCa 10.6 (none in TPN), Mg 1.8 (2 grams given outside of TPN today), Phos 2.8, others wnl Renal:  Scr 0.77 (bsl ~1), BUN 67 Hepatic: alb 2.1, Alk phos/AST/ALT/Tbili wnl, TG 99 Intake / Output; MIVF:  UOP 0.4 mL/kg/hr, ostomy 130 mL  GI Imaging:  3/4 CT - high-grade SBO is with transition point in the RLQ along the distal ileum, cannot exclude bowel wall ischemia; distended esophagus 3/4 KUB: Dilated fluid-filled loops of bowel  3/15 KUB - No evidence of bowel obstruction.  GI Surgeries / Procedures:  3/4 ex lap, small bowel resection, open abdomen with bowel in discontinuity  3/6 small bowel resection, end ileostomy, closure of abdomen 3/14 Cortrak placed  Central access: PICC 11/12/23 TPN start date: 11/14/2023  Nutritional Goals: Goal TPN rate is 55 mL/hr (provides 86 g of protein and 1653 kcals per day)  RD Assessment: Estimated Needs Total  Energy Estimated Needs: 1850-2050 Total Protein Estimated Needs: 85-105g Total Fluid Estimated Needs: >/= 2L  Current Nutrition:  VITAL 1.5 CAL @ 30 mL/hr, increasing every 8 hours to goal of 50 mL/hr  TPN  Plan:  In discussion with General Surgery, will half TPN tonight: 25 mL/hr at 1800 to meet 50% goal kcal and AA  Electrolytes in TPN: Na 0 mEq/L, K 40 mEq/L, Ca 0 mEq/L, Mg 0 mEq/L, Phos 15 mmol/L. Cl:Ac max Ac Remove MVI and trace elements from TPN Monitor TPN labs on Mon/Thurs, daily as needed   Thank you for involving pharmacy in this patient's care.  Cedric Fishman, PharmD, BCPS, BCCCP Clinical Pharmacist

## 2023-11-25 NOTE — Progress Notes (Signed)
 Nutrition Follow-up  DOCUMENTATION CODES:   Severe malnutrition in context of chronic illness (COPD and lung cancer)  INTERVENTION:   TPN per pharmacy  Enteral Nutrition via post-pyloric Cortrak: Vital 1.5 at 30 mL/hr and increase by 10 ml every 8 hours until goal of 50 ml/hr (1200 ml/day) 60 mL ProSource TF20 - Daily  TF at goal provides 1880 kcal, 101 gm protein, and 916 mL free water daily.   NUTRITION DIAGNOSIS:   Severe Malnutrition related to chronic illness (COPD and lung cancer) as evidenced by severe muscle depletion, severe fat depletion.  - Still applicable   GOAL:   Patient will meet greater than or equal to 90% of their needs  - Meeting via TF  MONITOR:   Vent status, Labs, TF tolerance, I & O's  REASON FOR ASSESSMENT:   Consult Enteral/tube feeding initiation and management  ASSESSMENT:   78 yo female admitted with mixed shock, septic and hypovolemic, high grade SBO with bowel ischemia. PMH includes COPD, GERD, nephrolithiasis, HTN, OSA, lung cancer (adenocarcinoma RLL dx 05/2023)  3/04 OR: Ex Lap, SB resection, Abthera wound VAC placement, open abdomen with bowel in discontinuity  3/05 PICC line placed 3/06 Return to OR: Re-opening of recent ex lap, SB resection with end ileostomy, abd wall closure and wound VAC remove 3/07 TPN initiated, Extubated, Re-Intubated 3/10 Extubated  3/11 off BiPAP 3/13 re-intubated, transferred to 4N ICU 3/14 Cortrak placed, currently gastric (per x-ray 3/15, Cortrak tube likely in third portion of duodenum) 3/15 Started trickle TF 3/16 Extubated  3/17 Failed BIPAP, re intubated  3/18 advance TF to goal, Half TPN tonight    Patient has been on TPN since 3/7 and recently started trickle tube feeds via cortrak on 3/16. TF's held morning of 3/17 for BiPap, had to be reintubated. Patient tolerating and per MD ok to advance tube feeds to goal. Pharmacy to half TPN tonight which will meet 50% of nutritional needs.  MAP < 65 mm  Hg, Levo at 18.75 ml/hr  Patient is currently intubated on ventilator support Temp (24hrs), Avg:99.1 F (37.3 C), Min:97.9 F (36.6 C), Max:101.1 F (38.4 C)  MAP (cuff): 62 mmHg  Admit weight: 54.4 kg   Current weight: 53.3 kg    Intake/Output Summary (Last 24 hours) at 11/25/2023 1325 Last data filed at 11/25/2023 1300 Gross per 24 hour  Intake 4778.77 ml  Output 1000 ml  Net 3778.77 ml   Net IO Since Admission: 9,172.84 mL [11/25/23 1325]  UOP 500 ml x 24 hours  Drains/Lines: Ileostomy 130 ml x 24 hours  Nutritionally Relevant Medications: Scheduled Meds:  docusate  100 mg Per Tube BID   enoxaparin (LOVENOX) injection  60 mg Subcutaneous Q12H   famotidine  20 mg Per Tube Daily   feeding supplement (PROSource TF20)  60 mL Per Tube Daily   feeding supplement (VITAL 1.5 CAL)  1,000 mL Per Tube Q24H   free water  200 mL Per Tube Q6H   Continuous Infusions:  amiodarone 30 mg/hr (11/25/23 1300)   fentaNYL infusion INTRAVENOUS 125 mcg/hr (11/25/23 1300)   meropenem (MERREM) IV Stopped (11/25/23 1129)   norepinephrine (LEVOPHED) Adult infusion 5 mcg/min (11/25/23 1300)   TPN ADULT (ION) 50 mL/hr at 11/25/23 1300   TPN ADULT (ION)     Labs Reviewed: BUN 67, Calcium 8.7,  CBG ranges from 107-143 mg/dL over the last 24 hours HgbA1c 5.8  Diet Order:   Diet Order     None  EDUCATION NEEDS:   Not appropriate for education at this time  Skin:  Skin Assessment: Skin Integrity Issues: Skin Integrity Issues:: Incisions Wound Vac: removed Incisions: closed abd incision with new ileostomy creation  Last BM:  130 ml via ileostomy x 24 hours  Height:   Ht Readings from Last 1 Encounters:  11/20/23 5' 7.01" (1.702 m)    Weight:   Wt Readings from Last 1 Encounters:  11/25/23 53.3 kg    Ideal Body Weight:  61.4 kg  BMI:  Body mass index is 18.4 kg/m.  Estimated Nutritional Needs:   Kcal:  1850-2050  Protein:  85-105g  Fluid:  >/=  2L   Elliot Dally, RD Registered Dietitian  See Amion for more information

## 2023-11-25 NOTE — Progress Notes (Signed)
 Pharmacy Antibiotic Note  Jenny White is a 78 y.o. female admitted on 11/11/2023 with sepsis.  Pharmacy has been consulted for meropenem dosing.  Completed 7 day course of meropenem 3/16 for HAP Tm 101.3, febrile Extubated 3/16 and re-intubated 3/17 WBC 23.9>22.9>26.1, increasing while off antibiotics 3/17 CXR: Small bilateral pleural effusions with associated hazy bibasilar opacities, similar to minimally progressed.  Plan: Meropenem 1g IV every 12 hours Monitor CBC, renal function, cultures, fever curve, and signs of clinical improvement Follow up respiratory culture  Height: 5' 7.01" (170.2 cm) Weight: 53.3 kg (117 lb 8.1 oz) IBW/kg (Calculated) : 61.62  Temp (24hrs), Avg:99.3 F (37.4 C), Min:97.9 F (36.6 C), Max:101.1 F (38.4 C)  Recent Labs  Lab 11/21/23 0707 11/22/23 0535 11/23/23 0848 11/24/23 0557 11/25/23 0623  WBC 25.5* 23.9* 22.7* 22.9* 26.1*  CREATININE 0.98 0.87 0.74 0.73 0.77    Estimated Creatinine Clearance: 48.8 mL/min (by C-G formula based on SCr of 0.77 mg/dL).    No Known Allergies  Antimicrobials this admission: Zosyn 3/4>> 3/10 Meropenem 3/11 >> 3/16, 3/18>> Tobramycin nebs 3/13>>3/16   Microbiology results: Resp Cx 3/18 sent BAL 3/13: ngtd Trach asp 3/9: Aeromonas, Hafnia alvei (S imipenem)  Bcx 3/4: ngtd FINAL MRSA 3/4: neg   Thank you for allowing pharmacy to be a part of this patient's care.  Stephenie Acres, PharmD PGY1 Pharmacy Resident 11/25/2023 10:39 AM

## 2023-11-25 NOTE — Plan of Care (Signed)
  Problem: Health Behavior/Discharge Planning: Goal: Ability to manage health-related needs will improve Outcome: Not Progressing   Problem: Clinical Measurements: Goal: Ability to maintain clinical measurements within normal limits will improve Outcome: Not Progressing Goal: Will remain free from infection Outcome: Not Progressing Goal: Diagnostic test results will improve Outcome: Not Progressing Goal: Respiratory complications will improve Outcome: Not Progressing Goal: Cardiovascular complication will be avoided Outcome: Not Progressing   Problem: Activity: Goal: Risk for activity intolerance will decrease Outcome: Not Progressing   Problem: Nutrition: Goal: Adequate nutrition will be maintained Outcome: Not Progressing   Problem: Coping: Goal: Level of anxiety will decrease Outcome: Not Progressing   Problem: Elimination: Goal: Will not experience complications related to bowel motility Outcome: Not Progressing Goal: Will not experience complications related to urinary retention Outcome: Not Progressing   Problem: Pain Managment: Goal: General experience of comfort will improve and/or be controlled Outcome: Not Progressing   Problem: Safety: Goal: Ability to remain free from injury will improve Outcome: Not Progressing   Problem: Skin Integrity: Goal: Risk for impaired skin integrity will decrease Outcome: Not Progressing   Problem: Respiratory: Goal: Ability to maintain a clear airway and adequate ventilation will improve Outcome: Not Progressing   Problem: Education: Goal: Individualized Educational Video(s) Outcome: Not Progressing   Problem: Coping: Goal: Ability to adjust to condition or change in health will improve Outcome: Not Progressing   Problem: Fluid Volume: Goal: Ability to maintain a balanced intake and output will improve Outcome: Not Progressing   Problem: Health Behavior/Discharge Planning: Goal: Ability to identify and utilize  available resources and services will improve Outcome: Not Progressing Goal: Ability to manage health-related needs will improve Outcome: Not Progressing   Problem: Metabolic: Goal: Ability to maintain appropriate glucose levels will improve Outcome: Not Progressing   Problem: Nutritional: Goal: Maintenance of adequate nutrition will improve Outcome: Not Progressing Goal: Progress toward achieving an optimal weight will improve Outcome: Not Progressing   Problem: Skin Integrity: Goal: Risk for impaired skin integrity will decrease Outcome: Not Progressing   Problem: Tissue Perfusion: Goal: Adequacy of tissue perfusion will improve Outcome: Not Progressing   Problem: Safety: Goal: Non-violent Restraint(s) Outcome: Not Progressing

## 2023-11-25 NOTE — Progress Notes (Signed)
 NAME:  Jenny White, MRN:  [id number], DOB:  07-11-1946, LOS: 14 ADMISSION DATE:  11/11/2023 CONSULTATION DATE:  11/11/2023 REFERRING MD:  Dwain Sarna - CCS CHIEF COMPLAINT: Shock  History of Present Illness:  Jenny White 78 year old woman with a pmhx significant for COPD, GERD, nephrolithiasis , HTN, OSA, Lung cancer ( Adenocarcinoma, diagnosed 05/2023) who presented to the emergency room with complaints of abdominal pain with N/V for the past week. She states the abdominal  pain got worsened after dinner. She reported experiencing nausea and  vomiting this morning.   In the ED  her labs were significant for a lactate 4.1 After fluid resuscitation, Lactate decreased to 2.5. CT scan showed high grade small bowel obstruction with transition point in the right lower quadrant along the distal ileum. Some trace of ascites and mesenteric stranding.Concern for bowel wall ischemia. Broad spectrum antibiotics ( zosyn) was started, blood cultures pending.   Patient was taken to the ER emergently with CCS 3/4 for Ex-lap / LOA. Intraoperative course  was notable for free fluid on entry, with closed loop bowel obstruction noted secondary to tight band adhesion. Patient left in discontinuity/ anastomosis not completed with open abdomen and plan for second look in 24-48 hrs. Patient remained intubated and was transferred to ICU in critical condition.   Pertinent Medical History:   Past Medical History:  Diagnosis Date   Arthritis    in back   COPD (chronic obstructive pulmonary disease) (HCC)    Difficult airway    Due to limited oral opening. Elective glidescope used previously.   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 Elevated LA found to have SBO taken emergently to surgery for EX-LAP.  3/7 extubated reintubated 3/10 extubated again 3/11 OFF NIPPV 3/12 got ativan at HS. Sleepy and difficult to arouse all day.  3/13 transferred to ICU for obtundation. Re-intubated.  3/16 Extubated  3/17 zyprexa x 1 overnight,  Obtunded , failed BiPAP, reintubated 3/17 several conversations with unhappy family members, offered transfer to different facility, called [facility]>> no beds  Interim History / Subjective:   Critically ill, intubated Febrile 101 yesterday Low urine output On Levophed about 15 mics Tachycardiac   Objective:  Blood pressure (!) 112/47, pulse (!) 112, temperature 98.2 F (36.8 C), temperature source Axillary, resp. rate (!) 24, height 5' 7.01" (1.702 m), weight 53.3 kg, SpO2 99%.    Vent Mode: PRVC FiO2 (%):  [40 %-60 %] 40 % Set Rate:  [20 bmp-26 bmp] 26 bmp Vt Set:  [490 mL] 490 mL PEEP:  [5 cmH20] 5 cmH20 Plateau Pressure:  [21 cmH20-27 cmH20] 27 cmH20   Intake/Output Summary (Last 24 hours) at 11/25/2023 1014 Last data filed at 11/25/2023 0939 Gross per 24 hour  Intake 4405.63 ml  Output 630 ml  Net 3775.63 ml   Filed Weights   11/23/23 0456 11/24/23 0500 11/25/23 0500  Weight: 59 kg 53.9 kg 53.3 kg   Physical Examination: General elderly woman, intubated, mild distress Pulm Bilateral ventilated breath sounds, prolonged expiration, no  accessory muscle use Card : S1, S2, Irr tacky, no RMG, atrial fibrillation on monitor Abd: mildly distended soft.  Intact midline incision. Ext : trace  pitting edema.  Generalized loss of muscle mass. Cool to touch Neuro: RASS 0 to -1, follows one-step commands  Labs show normal electrolytes, albumin 2.1, increase leukocytosis, stable anemia  Ancillary tests personally reviewed:   Chest x-ray shows small bilateral effusions hazy  bibasilar opacities  Assessment & Plan:     Acute on chronic resp failure with hypoxia, hypercarbia ETT 3/4 >>3/7 >> 3/10, 3/13- 3/16, 3/17 >>  Severe COPD -FEV1 30% RLL Cancer s/p SBRT  -Spontaneous breathing trials as tolerated , not tolerating today due to tachypnea -Budesonide/Brovana/Yupelri combination  Septic shock HAP:  Aeromonas and Hafnia Alvei pneumonia > both S to meropenmem -Levophed being titrated down -New fever, will repeat respiratory culture, low threshold to restart antibiotics, complete did 5 days of meropenem  New onset atrial fibrillation  -Switch IV heparin to Lovenox.  Will eventually need NOAC for stroke prevention.  - Continue amiodarone IV   High grade small bowel obstruction s/p ex lap 3/4 with resection and end ileostomy Severe protein calorie malnutrition  -Surgery following for postop care -TNA to continue while tube feeds To goal  Acute metabolic encephalopathy -likely related to hypercarbia Concern for opiate withdrawal  -Resume Norco, gabapentin and BuSpar  Diabetes type 2 -controlled, SSI   Best Practice: (right click and "Reselect all SmartList Selections" daily)   Diet/type: TPN, trickle feeds DVT prophylaxis: Therapeutic Lovenox GI prophylaxis: PPI Lines: PICC Foley:  Yes, and it is still needed Code Status:  full code Last date of multidisciplinary goals of care discussion: 3/17 detailed update to son and daughter-in-law [name].  They were upset and frustrated with repeated reintubation's.  We discussed possibility of opiate withdrawal.  We discussed possibility of tracheostomy  CRITICAL CARE Performed by: Comer Locket Sinda Leedom   Total critical care time:  35 minutes  Critical care time was exclusive of separately billable procedures and treating other patients.  Critical care was necessary to treat or prevent imminent or life-threatening deterioration.  Critical care was time spent personally by me on the following  activities: development of treatment plan with patient and/or surrogate as well as nursing, discussions with consultants, evaluation of patient's response to treatment, examination of patient, obtaining history from patient or surrogate, ordering and performing treatments and interventions, ordering and review of laboratory studies, ordering and review of radiographic studies, pulse oximetry, re-evaluation of patient's condition and participation in multidisciplinary rounds.   Cyril Mourning MD. Tonny Bollman. [place] [facility] Pager : [phone]  If no response to pager , please call [phone] until 7 pm After 7:00 pm call Elink  [phone]    11/25/2023 10:14 AM

## 2023-11-25 NOTE — TOC Progression Note (Signed)
 Transition of Care [facility]) - Progression Note    Patient Details  Name: Jenny White MRN: [id number] Date of Birth: 1946/03/02  Transition of Care [facility] CM/SW Contact  Mearl Latin, LCSW Phone Number: 11/25/2023, 5:42 PM  Clinical Narrative:    TOC continuing to follow. Patient remains intubated.   Expected Discharge Plan: Home w Home Health Services Barriers to Discharge: Continued Medical Work up  Expected Discharge Plan and Services   Discharge Planning Services: CM Consult Post Acute Care Choice: Home Health Living arrangements for the past 2 months: Single Family Home                                       Social Determinants of Health (SDOH) Interventions SDOH Screenings   Food Insecurity: No Food Insecurity (11/11/2023)  Housing: Low Risk  (11/11/2023)  Transportation Needs: No Transportation Needs (11/11/2023)  Utilities: Not At Risk (11/11/2023)  Depression (PHQ2-9): Low Risk  (06/17/2023)  Financial Resource Strain: Low Risk  (11/06/2023)   Received from [facility]  Social Connections: Unknown (11/11/2023)  Tobacco Use: Medium Risk (11/11/2023)    Readmission Risk Interventions     No data to display

## 2023-11-25 NOTE — Progress Notes (Addendum)
 0715 patient alert following commands on vent full support 0800 Daughter in law [name] called and given update on patient. Levo decreased based on orders 0830 new orders for Levo 1000 Daughter in law at bedside aware of plan of care  1200 patient failed wean decreasing fentanyl home meds re started  1630 fentanyl off patient tolerating vent without it denies pain when asked. 875 stool from ileostomy today. Over 600 ml urine for day shift with lasix today. Sputum sample still needed unable to obtain not enough secretions.

## 2023-11-26 ENCOUNTER — Inpatient Hospital Stay (HOSPITAL_COMMUNITY)

## 2023-11-26 DIAGNOSIS — R6521 Severe sepsis with septic shock: Secondary | ICD-10-CM | POA: Diagnosis not present

## 2023-11-26 DIAGNOSIS — J9621 Acute and chronic respiratory failure with hypoxia: Secondary | ICD-10-CM | POA: Diagnosis not present

## 2023-11-26 DIAGNOSIS — A419 Sepsis, unspecified organism: Secondary | ICD-10-CM | POA: Diagnosis not present

## 2023-11-26 DIAGNOSIS — I4891 Unspecified atrial fibrillation: Secondary | ICD-10-CM | POA: Diagnosis not present

## 2023-11-26 LAB — BASIC METABOLIC PANEL
Anion gap: 9 (ref 5–15)
BUN: 78 mg/dL — ABNORMAL HIGH (ref 8–23)
CO2: 26 mmol/L (ref 22–32)
Calcium: 8.5 mg/dL — ABNORMAL LOW (ref 8.9–10.3)
Chloride: 107 mmol/L (ref 98–111)
Creatinine, Ser: 0.89 mg/dL (ref 0.44–1.00)
GFR, Estimated: >60 mL/min (ref >60)
Glucose, Bld: 131 mg/dL — ABNORMAL HIGH (ref 70–99)
Potassium: 4.4 mmol/L (ref 3.5–5.1)
Sodium: 142 mmol/L (ref 135–145)

## 2023-11-26 LAB — CBC
HCT: 26.7 % — ABNORMAL LOW (ref 36.0–46.0)
Hemoglobin: 8.1 g/dL — ABNORMAL LOW (ref 12.0–15.0)
MCH: 29.3 pg (ref 26.0–34.0)
MCHC: 30.3 g/dL (ref 30.0–36.0)
MCV: 96.7 fL (ref 80.0–100.0)
Platelets: 426 10*3/uL — ABNORMAL HIGH (ref 150–400)
RBC: 2.76 MIL/uL — ABNORMAL LOW (ref 3.87–5.11)
RDW: 18.9 % — ABNORMAL HIGH (ref 11.5–15.5)
WBC: 20.4 10*3/uL — ABNORMAL HIGH (ref 4.0–10.5)
nRBC: 0.8 % — ABNORMAL HIGH (ref 0.0–0.2)

## 2023-11-26 LAB — GLUCOSE, CAPILLARY
Glucose-Capillary: 103 mg/dL — ABNORMAL HIGH (ref 70–99)
Glucose-Capillary: 109 mg/dL — ABNORMAL HIGH (ref 70–99)
Glucose-Capillary: 118 mg/dL — ABNORMAL HIGH (ref 70–99)
Glucose-Capillary: 122 mg/dL — ABNORMAL HIGH (ref 70–99)
Glucose-Capillary: 131 mg/dL — ABNORMAL HIGH (ref 70–99)

## 2023-11-26 MED ORDER — POLYETHYLENE GLYCOL 3350 17 G PO PACK: 17.0000 g | PACK | Freq: Every day | ORAL | Status: DC | PRN

## 2023-11-26 MED ORDER — HEPARIN (PORCINE) 25000 UT/250ML-% IV SOLN
1200.0000 [IU]/h | INTRAVENOUS | Status: DC
  Administered 2023-11-26: 1200 [IU]/h via INTRAVENOUS
  Administered 2023-11-27: 1050 [IU]/h via INTRAVENOUS
  Filled 2023-11-26 (×2): qty 250

## 2023-11-26 MED ORDER — HYALURONIDASE HUMAN 150 UNIT/ML IJ SOLN
150.0000 [IU] | Freq: Once | INTRAMUSCULAR | Status: AC
  Administered 2023-11-26: 150 [IU] via SUBCUTANEOUS
  Filled 2023-11-26: qty 1

## 2023-11-26 MED ORDER — FENTANYL CITRATE PF 50 MCG/ML IJ SOSY
25.0000 ug | PREFILLED_SYRINGE | INTRAMUSCULAR | Status: DC | PRN
  Administered 2023-11-26 – 2023-11-27 (×7): 100 ug via INTRAVENOUS
  Administered 2023-11-27: 50 ug via INTRAVENOUS
  Administered 2023-11-27 – 2023-11-28 (×3): 100 ug via INTRAVENOUS
  Administered 2023-11-29: 50 ug via INTRAVENOUS
  Administered 2023-11-29: 25 ug via INTRAVENOUS
  Filled 2023-11-26 (×4): qty 2
  Filled 2023-11-26 (×3): qty 1
  Filled 2023-11-26 (×6): qty 2

## 2023-11-26 MED ORDER — DEXMEDETOMIDINE HCL IN NACL 400 MCG/100ML IV SOLN
0.0000 ug/kg/h | INTRAVENOUS | Status: DC
Start: 2023-11-26 — End: 2023-11-29
  Administered 2023-11-26: 0.4 ug/kg/h via INTRAVENOUS
  Administered 2023-11-27: 0.6 ug/kg/h via INTRAVENOUS
  Administered 2023-11-27: 1.2 ug/kg/h via INTRAVENOUS
  Administered 2023-11-27: 0.6 ug/kg/h via INTRAVENOUS
  Administered 2023-11-28 – 2023-11-29 (×2): 0.5 ug/kg/h via INTRAVENOUS
  Filled 2023-11-26 (×6): qty 100

## 2023-11-26 MED ORDER — NUTRISOURCE FIBER PO PACK
1.0000 | PACK | Freq: Two times a day (BID) | ORAL | Status: DC
  Administered 2023-11-26 – 2023-11-27 (×4): 1
  Filled 2023-11-26 (×5): qty 1

## 2023-11-26 MED ORDER — AMIODARONE HCL 200 MG PO TABS
200.0000 mg | ORAL_TABLET | Freq: Every day | ORAL | Status: DC
  Administered 2023-11-26 – 2023-11-30 (×5): 200 mg
  Filled 2023-11-26 (×5): qty 1

## 2023-11-26 MED ORDER — CHLORHEXIDINE GLUCONATE CLOTH 2 % EX PADS
6.0000 | MEDICATED_PAD | Freq: Every day | CUTANEOUS | Status: DC
  Administered 2023-11-27 – 2023-12-09 (×14): 6 via TOPICAL

## 2023-11-26 MED ORDER — SENNA 8.6 MG PO TABS: 1.0000 | ORAL_TABLET | Freq: Every evening | ORAL | Status: DC | PRN

## 2023-11-26 NOTE — Progress Notes (Signed)
 Pt failed SBT due to RR >42bpm and marked accessory muscle use with a PS of 15 and PEEP set at 5.

## 2023-11-26 NOTE — Progress Notes (Signed)
 On shift change report, this RN was told by previous shift RN that CCM changed blood pressure goals to SBP>90, regardless of MAP. Levophed orders had been changed to reflect that. This RN called E-link to verify that MD is okay with patient's MAP being in the 50s, despite SBP being greater than 90. No new orders.

## 2023-11-26 NOTE — Progress Notes (Addendum)
 eLink Physician-Brief Progress Note Patient Name: Jenny White DOB: May 19, 1946 MRN: [id number]   Date of Service  11/26/2023  HPI/Events of Note  57 yof presenting with abdominal pain now s/p ex-lap small bowel resection followed by small bowel resection, ileostomy and closure.   There is concern that the patient was oversedated earlier in the day and now transition to as needed fentanyl.  Has received 2 doses of fentanyl without substantial improvement.  Currently restless and reporting abdominal pain  eICU Interventions  Increase fentanyl frequency to every hours as needed.   2307 -add bilateral wrist restraints for patient safety  Intervention Category Intermediate Interventions: Pain - evaluation and management  Gloris Shiroma 11/26/2023, 10:30 PM

## 2023-11-26 NOTE — Progress Notes (Signed)
 Discussed PSV/CPAP settings w RT, pt currently weaning 18/5 40% RR high 30s-40s.  I advised to return pt to full support.  Will d/w Dr. Vassie Loll.   Tessie Fass MSN, AGACNP-BC [facility] Pulmonary/Critical Care Medicine 11/26/2023, 11:51 AM

## 2023-11-26 NOTE — Progress Notes (Signed)
 PHARMACY - TOTAL PARENTERAL NUTRITION CONSULT NOTE   On goal tube feeds.  Weaning TPN off today per Surgery.  Pharmacy will sign off.  Please reconsult as needed.   Thank you for involving [place] in this patient's care.  Cedric Fishman, PharmD, BCPS, BCCCP Clinical Pharmacist

## 2023-11-26 NOTE — Consult Note (Addendum)
 WOC Nurse ostomy consult note Pt had ileostomy surgery performed 3/6; WOC team was informed today of the patient.   She is critically ill and on the ventilator; not ready for teaching at this time.  Stoma is red and viable, 1 1/4 inches, flush with skin level. Current pouch is leaking behind the barrier.  50cc green liquid stool in the pouch. Applied barrier ring and one piece convex pouch.  WOC team will begin teaching sessions when Pt is stable and out of ICU.  Daughter in-law at the bedside.  Discussed ostomy changes and emptying and routines.  Educational materials left in the room. WOC team will begin teaching sessions when Pt is out of ICU.  5 sets of each supply ordered to the room for staff nurses' useHart [place] # [phone] and convex Gigi Gin # [phone] Enrolled patient in [facility] DC program: NOT YET. Thank-you,  Cammie Mcgee MSN, RN, CWOCN, [place], CNS [phone]

## 2023-11-26 NOTE — Progress Notes (Signed)
 PHARMACY - ANTICOAGULATION CONSULT NOTE  Pharmacy Consult for heparin Indication: atrial fibrillation  No Known Allergies  Patient Measurements: Height: 5' 7.01" (170.2 cm) Weight: 57.2 kg (126 lb 1.7 oz) IBW/kg (Calculated) : 61.62 Heparin Dosing Weight: 49.6 kg   Vital Signs: Temp: 99.1 F (37.3 C) (03/19 1200) Temp Source: Axillary (03/19 1200) BP: 123/47 (03/19 1315) Pulse Rate: 91 (03/19 1315)  Labs: Recent Labs    11/24/23 0557 11/24/23 1133 11/24/23 1348 11/25/23 0623 11/26/23 0825  HGB 9.0*   < > 10.2* 9.7* 8.1*  HCT 30.9*   < > 30.0* 32.5* 26.7*  PLT 534*  --   --  520* 426*  CREATININE 0.73  --   --  0.77 0.89   < > = values in this interval not displayed.    Estimated Creatinine Clearance: 47 mL/min (by C-G formula based on SCr of 0.89 mg/dL).  Assessment: 64 yof presenting with abdominal pain now s/p ex-lap small bowel resection followed by small bowel resection, ileostomy and closure. Developed Afib RVR and was started on heparin followed by therapeutic lovenox. Pharmacy reconsulted for heparin given possibility of upcoming tracheostomy.   Patient was previously therapeutic on heparin at 1200 units/hr. Last dose of Lovenox at 10:00 3/19.   Goal of Therapy:  Heparin level 0.3-0.5 units/ml Monitor platelets by anticoagulation protocol: Yes   Plan:  Resume heparin infusion at 1200 units/hr at 18:00  Heparin level in 8 hours and daily with CBC  Monitor for signs/symptoms of bleeding  Thank you for involving pharmacy in this patient's care.  Cedric Fishman, PharmD, BCPS, BCCCP Clinical Pharmacist

## 2023-11-26 NOTE — Progress Notes (Signed)
  13 Days Post-Op   Chief Complaint/Subjective: Failed SBT this morning, continued levophed requirement, high ostomy output, tolerating tube feeds  Objective: Vital signs in last 24 hours: Temp:  [98.2 F (36.8 C)-99.8 F (37.7 C)] 99.8 F (37.7 C) (03/19 0800) Pulse Rate:  [68-119] 91 (03/19 0745) Resp:  [17-36] 31 (03/19 0745) BP: (78-149)/(33-100) 128/46 (03/19 0745) SpO2:  [87 %-100 %] 99 % (03/19 0751) FiO2 (%):  [40 %] 40 % (03/19 0751) Weight:  [57.2 kg] 57.2 kg (03/19 0500) Last BM Date : 11/26/23 Intake/Output from previous day: 03/18 0701 - 03/19 0700 In: 3715.9 [I.V.:1930.5; NG/GT:1535.3; IV Piggyback:250] Out: 3805 [Urine:2305; Stool:1500]  PE: Gen: NAD Resp: full vent support Card: RRR Abd: soft, incision c/d/I, ostomy with thin yellow output  Lab Results:  Recent Labs    11/24/23 0557 11/24/23 1133 11/24/23 1348 11/25/23 0623  WBC 22.9*  --   --  26.1*  HGB 9.0*   < > 10.2* 9.7*  HCT 30.9*   < > 30.0* 32.5*  PLT 534*  --   --  520*   < > = values in this interval not displayed.   Recent Labs    11/24/23 0557 11/24/23 1133 11/24/23 1348 11/25/23 0623  NA 150*   < > 148* 145  K 3.8   < > 4.4 4.3  CL 114*  --   --  110  CO2 29  --   --  25  GLUCOSE 132*  --   --  136*  BUN 60*  --   --  67*  CREATININE 0.73  --   --  0.77  CALCIUM 9.0  --   --  8.7*   < > = values in this interval not displayed.   No results for input(s): "LABPROT", "INR" in the last 72 hours.    Component Value Date/Time   NA 145 11/25/2023 0623   K 4.3 11/25/2023 0623   CL 110 11/25/2023 0623   CO2 25 11/25/2023 0623   GLUCOSE 136 (H) 11/25/2023 0623   BUN 67 (H) 11/25/2023 0623   CREATININE 0.77 11/25/2023 0623   CREATININE 0.68 10/15/2023 1137   CALCIUM 8.7 (L) 11/25/2023 0623   PROT 5.2 (L) 11/25/2023 0623   ALBUMIN 2.1 (L) 11/25/2023 0623   AST 28 11/25/2023 0623   AST 20 10/15/2023 1137   ALT 86 (H) 11/25/2023 0623   ALT 15 10/15/2023 1137   ALKPHOS 103  11/25/2023 0623   BILITOT 0.6 11/25/2023 0623   BILITOT 0.4 10/15/2023 1137   GFRNONAA >60 11/25/2023 0623   GFRNONAA >60 10/15/2023 1137    Assessment/Plan POD 14/12 S/P ex-lap small bowel resection, left open; ex-lap, small bowel resection, ileostomy and closure, Dr. Dwain Sarna for SBO - at full tube feeds - ostomy functional - can wean TPN - add benefiber to help with output volume - VTE: onlovenox 60 mg BID - Remainder of care per CCM. Surgery will continue to follow.  Attempted to reach out to daughter by phone but went straight to VM   LOS: 15 days   I reviewed last 24 h vitals and pain scores, last 48 h intake and output, last 24 h labs and trends, and last 24 h imaging results.  This care required moderate level of medical decision making.   De Blanch Hardy [facility] Surgery at [facility] 11/26/2023, 8:43 AM Please see Amion for pager number during day hours 7:00am-4:30pm or 7:00am -11:30am on weekends

## 2023-11-26 NOTE — Progress Notes (Signed)
 NAME:  Jenny White, MRN:  [id number], DOB:  11/06/1945, LOS: 15 ADMISSION DATE:  11/11/2023 CONSULTATION DATE:  11/11/2023 REFERRING MD:  Dwain Sarna - CCS CHIEF COMPLAINT: Shock  History of Present Illness:  Jenny White 78 year old woman with a pmhx significant for  who presented to the emergency room with complaints of abdominal pain with N/V for the past week. She states the abdominal  pain got worsened after dinner. She reported experiencing nausea and  vomiting this morning.   In the ED  her labs were significant for a lactate 4.1 After fluid resuscitation, Lactate decreased to 2.5. CT scan showed high grade small bowel obstruction with transition point in the right lower quadrant along the distal ileum. Some trace of ascites and mesenteric stranding.Concern for bowel wall ischemia. Broad spectrum antibiotics ( zosyn) was started, blood cultures pending.   Patient was taken to the ER emergently with CCS 3/4 for Ex-lap / LOA. Intraoperative course  was notable for free fluid on entry, with closed loop bowel obstruction noted secondary to tight band adhesion. Patient left in discontinuity/ anastomosis not completed with open abdomen and plan for second look in 24-48 hrs. Patient remained intubated and was transferred to ICU in critical condition.   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 Elevated LA found to have SBO taken emergently to surgery for EX-LAP.  3/7 extubated reintubated 3/10 extubated again 3/11 OFF NIPPV 3/12 got ativan at HS. Sleepy and difficult to arouse all day.  3/13 transferred to ICU for obtundation. Re-intubated.  3/16 Extubated  3/17 zyprexa x 1 overnight,  Obtunded , failed BiPAP,  reintubated 3/17 several conversations with unhappy family members, offered transfer to different facility, called [facility]>> no beds 3/18 resumed BuSpar, gabapentin, stopped oxycodone since oversedated  Interim History / Subjective:   Remains critically ill, intubated Afebrile On lower doses of Levophed 3-5 mics Good urine output with Lasix, increased stool output 1.5 L Fentanyl drip was stopped yesterday and oxycodone DC Amiodarone IV extravasated   Objective:  Blood pressure (!) 83/36, pulse 85, temperature 99.8 F (37.7 C), temperature source Axillary, resp. rate (!) 25, height 5' 7.01" (1.702 m), weight 57.2 kg, SpO2 100%.    Vent Mode: PRVC FiO2 (%):  [40 %] 40 % Set Rate:  [24 bmp] 24 bmp Vt Set:  [490 mL] 490 mL PEEP:  [5 cmH20] 5 cmH20 Plateau Pressure:  [19 cmH20-24 cmH20] 24 cmH20   Intake/Output Summary (Last 24 hours) at 11/26/2023 1007 Last data filed at 11/26/2023 0900 Gross per 24 hour  Intake 3280.65 ml  Output 3635 ml  Net -354.35 ml   Filed Weights   11/24/23 0500 11/25/23 0500 11/26/23 0500  Weight: 53.9 kg 53.3 kg 57.2 kg   Physical Examination: General elderly woman, intubated, mild distress Pulm no accessory muscle use, bilateral ventilated breath sounds, prolonged expiration Card : S1, S2, Irr tacky, no RMG, atrial fibrillation on monitor Abd: mildly distended soft.  Intact midline incision. Ext : trace  pitting edema.  Generalized loss of muscle mass. Cool to touch Neuro: RASS 0 to -1, follows one-step commands, except tongue, appears very weak  Labs show normal electrolytes, albumin 2.1, decreased leukocytosis, drop in hemoglobin to 8.1  Ancillary tests personally  reviewed:   Chest x-ray shows small bilateral effusions hazy bibasilar opacities  Assessment & Plan:     Acute on chronic resp failure with hypoxia, hypercarbia ETT 3/4 >>3/7 >> 3/10, 3/13- 3/16, 3/17 >>  Severe COPD -FEV1 30% RLL Cancer s/p SBRT  -Spontaneous breathing  trials as tolerated , not tolerating today due to tachypnea -Budesonide/Brovana/Yupelri combination  Septic shock HAP:  Aeromonas and Hafnia Alvei pneumonia > both S to meropenmem -Levophed being titrated down -completed 5 days of meropenem but restarted 3/18 >>> due to new fever and leukocytosis, now trending down  New onset atrial fibrillation  -Switched IV heparin to Lovenox.  Will eventually need NOAC for stroke prevention.  -Switch to oral amiodarone  High grade small bowel obstruction s/p ex lap 3/4 with resection and end ileostomy Severe protein calorie malnutrition  -Surgery following for postop care -TNA to continue while tube feeds To goal -Add fiber to decrease high ostomy output  Acute metabolic encephalopathy -likely related to hypercarbia Concern for opiate withdrawal  -Resumed gabapentin and BuSpar Held Norco due to oversedation, he is doing intermittent fentanyl with goal RASS 0 to -1 to prevent withdrawal  Diabetes type 2 -controlled, SSI  Amiodarone extravasation in left arm -hyaluronidase injection  Summary -rocky course with repeated reintubation due to poor lung function and intolerance to sedating medications, not weaning well this time around and may need tracheostomy Updating daughter-in-law [name] every day   Best Practice: (right click and "Reselect all SmartList Selections" daily)   Diet/type: TPN, trickle feeds DVT prophylaxis: Therapeutic Lovenox GI prophylaxis: PPI Lines: PICC Foley:  Yes, and it is still needed Code Status:  full code Last date of multidisciplinary goals of care discussion: 3/17 detailed update to son and daughter-in-law [name].  They were upset and frustrated with repeated reintubation's.  We discussed possibility of opiate withdrawal.  We discussed possibility of tracheostomy  CRITICAL CARE Performed by: Comer Locket Evelia Waskey   Total critical care time:  34 minutes  Critical care time was exclusive of separately billable  procedures and treating other patients.  Critical care was necessary to treat or prevent imminent or life-threatening deterioration.  Critical care was time spent personally by me on the following activities: development of treatment plan with patient and/or surrogate as well as nursing, discussions with consultants, evaluation of patient's response to treatment, examination of patient, obtaining history from patient or surrogate, ordering and performing treatments and interventions, ordering and review of laboratory studies, ordering and review of radiographic studies, pulse oximetry, re-evaluation of patient's condition and participation in multidisciplinary rounds.   Cyril Mourning MD. Tonny Bollman. [place] [facility] Pager : [phone]  If no response to pager , please call [phone] until 7 pm After 7:00 pm call Elink  [phone]    11/26/2023 10:07 AM

## 2023-11-27 ENCOUNTER — Inpatient Hospital Stay (HOSPITAL_COMMUNITY)

## 2023-11-27 DIAGNOSIS — J9602 Acute respiratory failure with hypercapnia: Secondary | ICD-10-CM

## 2023-11-27 DIAGNOSIS — F419 Anxiety disorder, unspecified: Secondary | ICD-10-CM

## 2023-11-27 DIAGNOSIS — R6521 Severe sepsis with septic shock: Secondary | ICD-10-CM | POA: Diagnosis not present

## 2023-11-27 DIAGNOSIS — J9601 Acute respiratory failure with hypoxia: Secondary | ICD-10-CM | POA: Diagnosis not present

## 2023-11-27 DIAGNOSIS — J151 Pneumonia due to Pseudomonas: Secondary | ICD-10-CM

## 2023-11-27 DIAGNOSIS — B9689 Other specified bacterial agents as the cause of diseases classified elsewhere: Secondary | ICD-10-CM

## 2023-11-27 DIAGNOSIS — A419 Sepsis, unspecified organism: Secondary | ICD-10-CM | POA: Diagnosis not present

## 2023-11-27 LAB — RENAL FUNCTION PANEL
Albumin: 1.7 g/dL — ABNORMAL LOW (ref 3.5–5.0)
Anion gap: 7 (ref 5–15)
BUN: 76 mg/dL — ABNORMAL HIGH (ref 8–23)
CO2: 27 mmol/L (ref 22–32)
Calcium: 8.5 mg/dL — ABNORMAL LOW (ref 8.9–10.3)
Chloride: 110 mmol/L (ref 98–111)
Creatinine, Ser: 0.81 mg/dL (ref 0.44–1.00)
GFR, Estimated: >60 mL/min (ref >60)
Glucose, Bld: 128 mg/dL — ABNORMAL HIGH (ref 70–99)
Phosphorus: 3.6 mg/dL (ref 2.5–4.6)
Potassium: 4.7 mmol/L (ref 3.5–5.1)
Sodium: 144 mmol/L (ref 135–145)

## 2023-11-27 LAB — HEPARIN LEVEL (UNFRACTIONATED)
Heparin Unfractionated: 0.7 [IU]/mL (ref 0.30–0.70)
Heparin Unfractionated: 0.48 [IU]/mL (ref 0.30–0.70)

## 2023-11-27 LAB — CBC
HCT: 25.8 % — ABNORMAL LOW (ref 36.0–46.0)
Hemoglobin: 8 g/dL — ABNORMAL LOW (ref 12.0–15.0)
MCH: 29.6 pg (ref 26.0–34.0)
MCHC: 31 g/dL (ref 30.0–36.0)
MCV: 95.6 fL (ref 80.0–100.0)
Platelets: 414 10*3/uL — ABNORMAL HIGH (ref 150–400)
RBC: 2.7 MIL/uL — ABNORMAL LOW (ref 3.87–5.11)
RDW: 18.6 % — ABNORMAL HIGH (ref 11.5–15.5)
WBC: 16.5 10*3/uL — ABNORMAL HIGH (ref 4.0–10.5)
nRBC: 2 % — ABNORMAL HIGH (ref 0.0–0.2)

## 2023-11-27 LAB — GLUCOSE, CAPILLARY
Glucose-Capillary: 102 mg/dL — ABNORMAL HIGH (ref 70–99)
Glucose-Capillary: 72 mg/dL (ref 70–99)
Glucose-Capillary: 82 mg/dL (ref 70–99)
Glucose-Capillary: 85 mg/dL (ref 70–99)

## 2023-11-27 MED ORDER — FUROSEMIDE 10 MG/ML IJ SOLN
40.0000 mg | Freq: Once | INTRAMUSCULAR | Status: AC
  Administered 2023-11-27: 40 mg via INTRAVENOUS
  Filled 2023-11-27: qty 4

## 2023-11-27 MED ORDER — ALUM & MAG HYDROXIDE-SIMETH 200-200-20 MG/5ML PO SUSP
15.0000 mL | ORAL | Status: DC | PRN
  Administered 2023-11-27: 15 mL via ORAL
  Filled 2023-11-27: qty 30

## 2023-11-27 MED ORDER — VITAL 1.5 CAL PO LIQD: 1000.0000 mL | ORAL | Status: DC

## 2023-11-27 MED ORDER — VITAL 1.5 CAL PO LIQD
1000.0000 mL | ORAL | Status: DC
  Administered 2023-11-28: 1000 mL

## 2023-11-27 NOTE — Progress Notes (Signed)
 NAME:  Jenny White, MRN:  [id number], DOB:  1946/07/08, LOS: 16 ADMISSION DATE:  11/11/2023 CONSULTATION DATE:  11/11/2023 REFERRING MD:  Dwain Sarna - CCS CHIEF COMPLAINT: Shock  History of Present Illness:  Jenny White 78 year old woman with a pmhx significant for  who presented to the emergency room with complaints of abdominal pain with N/V for the past week. She states the abdominal  pain got worsened after dinner. She reported experiencing nausea and  vomiting this morning.   In the ED  her labs were significant for a lactate 4.1 After fluid resuscitation, Lactate decreased to 2.5. CT scan showed high grade small bowel obstruction with transition point in the right lower quadrant along the distal ileum. Some trace of ascites and mesenteric stranding.Concern for bowel wall ischemia. Broad spectrum antibiotics ( zosyn) was started, blood cultures pending.   Patient was taken to the ER emergently with CCS 3/4 for Ex-lap / LOA. Intraoperative course  was notable for free fluid on entry, with closed loop bowel obstruction noted secondary to tight band adhesion. Patient left in discontinuity/ anastomosis not completed with open abdomen and plan for second look in 24-48 hrs. Patient remained intubated and was transferred to ICU in critical condition.   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 Elevated LA found to have SBO taken emergently to surgery for EX-LAP.  3/7 extubated reintubated 3/10 extubated again 3/11 OFF NIPPV 3/12 got ativan at HS. Sleepy and difficult to arouse all day.  3/13 transferred to ICU for obtundation. Re-intubated.  3/16 Extubated  3/17 zyprexa x 1 overnight,  Obtunded , failed BiPAP,  reintubated 3/17 several conversations with unhappy family members, offered transfer to different facility, called [facility]>> no beds 3/18 resumed BuSpar, gabapentin, stopped oxycodone & fent gtt since oversedated 3/19 Amiodarone IV extravasated LUE  Interim History / Subjective:   Remains critically ill, intubated On Precedex and low-dose Levophed Good urine output but 9 L positive Son at bedside   Objective:  Blood pressure (!) 119/38, pulse 78, temperature 98.2 F (36.8 C), temperature source Axillary, resp. rate (!) 25, height 5' 7.01" (1.702 m), weight 57.7 kg, SpO2 100%.    Vent Mode: CPAP;PSV FiO2 (%):  [40 %] 40 % Set Rate:  [20 bmp] 20 bmp Vt Set:  [490 mL] 490 mL PEEP:  [5 cmH20] 5 cmH20 Pressure Support:  [10 cmH20] 10 cmH20 Plateau Pressure:  [22 cmH20-24 cmH20] 22 cmH20   Intake/Output Summary (Last 24 hours) at 11/27/2023 1127 Last data filed at 11/27/2023 1100 Gross per 24 hour  Intake 2536.07 ml  Output 1815 ml  Net 721.07 ml   Filed Weights   11/25/23 0500 11/26/23 0500 11/27/23 0500  Weight: 53.3 kg 57.2 kg 57.7 kg   Physical Examination: General elderly woman, intubated, mild distress, very anxious during wean Pulm no accessory muscle use, bilateral ventilated breath sounds, tachypneic on weaning Card : S1, S2, Irr tacky, no RMG, atrial fibrillation on monitor Abd: mildly distended soft.  Intact midline incision. Ext : trace  pitting edema.  Generalized loss of muscle mass. Cool to touch Neuro: Awake, follows one-step commands, weak and deconditioned  Labs show normal electrolytes, albumin 1.7, decreased leukocytosis to 16K, stable hemoglobin  Ancillary tests personally  reviewed:   Chest x-ray shows small bilateral effusions hazy bibasilar opacities  Assessment & Plan:     Acute on chronic resp failure with hypoxia, hypercarbia ETT 3/4 >>3/7 >> 3/10, 3/13- 3/16, 3/17 >>  Severe COPD -FEV1 30% RLL Cancer s/p SBRT  -Spontaneous breathing  trials as tolerated , not tolerating today due to tachypnea -Budesonide/Brovana/Yupelri combination -Discussed with family about proceeding with tracheostomy here, they will get back to [place] with her decision  Septic shock HAP:  Aeromonas and Hafnia Alvei pneumonia > both S to meropenmem -Levophed being titrated down -completed 5 days of meropenem but restarted 3/18 >>> due to new fever and leukocytosis, now trending down, plan for 10 to 14 days total  New onset atrial fibrillation Volume overload  -Switched IV heparin to Lovenox.  Will eventually need NOAC for stroke prevention.  -Switched  to oral amiodarone -Diurese with Lasix  High grade small bowel obstruction s/p ex lap 3/4 with resection and end ileostomy Severe protein calorie malnutrition  -Surgery following for postop care -TNA off now that tube feeds To goal -Add fiber to decrease ostomy output  Acute metabolic encephalopathy -likely related to hypercarbia Concern for opiate withdrawal Severe anxiety at baseline  -Resumed gabapentin and BuSpar Held Norco due to oversedation, continue intermittent fentanyl with goal RASS 0 to -1 to prevent withdrawal  Diabetes type 2 -controlled, SSI  Amiodarone extravasation in left arm -s/p hyaluronidase injection Limb elevation  Summary -rocky course with repeated reintubation due to poor lung function and intolerance to sedating medications, not weaning well this time around .  I have updated daughter-in-law [name] and son, [name]  They are agreeable to tracheostomy with the hope of liberating her from the vent.  They do not want her to be in facility care but do want to give her a chance at liberating and returning to previous level of functioning.  I have explained the tracheostomy does involve long-term rehab and there is a good chance she may not return to her previous level of functioning   Best Practice: (right click and "Reselect all SmartList Selections" daily)   Diet/type:  TPN, trickle feeds DVT prophylaxis: Therapeutic Lovenox GI prophylaxis: PPI Lines: PICC Foley:  Yes, and it is still needed Code Status:  full code Last date of multidisciplinary goals of care discussion: 3/17 detailed update to son and daughter-in-law [name].  They were upset and frustrated with repeated reintubation's.  We discussed possibility of opiate withdrawal.  We discussed possibility of tracheostomy  CRITICAL CARE Performed by: Comer Locket Lamyra Malcolm   Total critical care time:  32 minutes  Critical care time was exclusive of separately billable procedures and treating other patients.  Critical care was necessary to treat or prevent imminent or life-threatening deterioration.  Critical care was time spent personally by me on the following activities: development of treatment plan with patient and/or surrogate as well as nursing, discussions with consultants, evaluation of patient's response to treatment, examination of patient, obtaining history from patient or surrogate, ordering and performing treatments and interventions, ordering and review of laboratory studies, ordering and review of radiographic studies, pulse oximetry, re-evaluation of patient's condition and participation in multidisciplinary rounds.   Cyril Mourning MD. Tonny Bollman. [facility] Pager : [phone]  If no response to pager , please call [phone] until 7 pm After 7:00 pm call Elink  [phone]    11/27/2023 11:27 AM

## 2023-11-27 NOTE — Progress Notes (Signed)
 PHARMACY - ANTICOAGULATION CONSULT NOTE  Pharmacy Consult for heparin Indication: atrial fibrillation  No Known Allergies  Patient Measurements: Height: 5' 7.01" (170.2 cm) Weight: 57.7 kg (127 lb 3.3 oz) IBW/kg (Calculated) : 61.62 Heparin Dosing Weight: 49.6 kg   Vital Signs: Temp: 98.2 F (36.8 C) (03/20 1200) Temp Source: Axillary (03/20 1200) BP: 120/46 (03/20 1300) Pulse Rate: 76 (03/20 1300)  Labs: Recent Labs    11/25/23 0623 11/26/23 0825 11/27/23 0220 11/27/23 0222 11/27/23 1028  HGB 9.7* 8.1*  --  8.0*  --   HCT 32.5* 26.7*  --  25.8*  --   PLT 520* 426*  --  414*  --   HEPARINUNFRC  --   --  0.70  --  0.48  CREATININE 0.77 0.89 0.81  --   --     Estimated Creatinine Clearance: 52.1 mL/min (by C-G formula based on SCr of 0.81 mg/dL).  Assessment: 89 yof presenting with abdominal pain now s/p ex-lap small bowel resection followed by small bowel resection, ileostomy and closure. Developed Afib RVR and was started on heparin followed by therapeutic lovenox. Pharmacy reconsulted for heparin given possibility of upcoming tracheostomy.   Heparin level 0.48, therapeutic on heparin infusion at 1100 units/hr. Hgb 8, Plt 414, stable. No signs/symptoms of bleeding noted.  Goal of Therapy:  Heparin level 0.3-0.5 units/ml Monitor platelets by anticoagulation protocol: Yes   Plan:  Decrease heparin infusion to 1050 units/hr Heparin level in 8 hours and daily with CBC  Monitor for signs/symptoms of bleeding  Thank you for involving pharmacy in this patient's care.  Stephenie Acres, PharmD PGY1 Pharmacy Resident 11/27/2023 2:15 PM

## 2023-11-27 NOTE — Plan of Care (Signed)
  Problem: Education: Goal: Knowledge of General Education information will improve Description: Including pain rating scale, medication(s)/side effects and non-pharmacologic comfort measures Outcome: Progressing   Problem: Health Behavior/Discharge Planning: Goal: Ability to manage health-related needs will improve Outcome: Progressing   Problem: Clinical Measurements: Goal: Ability to maintain clinical measurements within normal limits will improve Outcome: Progressing Goal: Will remain free from infection Outcome: Progressing Goal: Diagnostic test results will improve Outcome: Progressing Goal: Respiratory complications will improve Outcome: Progressing Goal: Cardiovascular complication will be avoided Outcome: Progressing   Problem: Coping: Goal: Level of anxiety will decrease Outcome: Progressing   Problem: Pain Managment: Goal: General experience of comfort will improve and/or be controlled Outcome: Progressing   Problem: Safety: Goal: Ability to remain free from injury will improve Outcome: Progressing   Problem: Skin Integrity: Goal: Risk for impaired skin integrity will decrease Outcome: Progressing   Problem: Activity: Goal: Ability to tolerate increased activity will improve Outcome: Progressing   Problem: Respiratory: Goal: Ability to maintain a clear airway and adequate ventilation will improve Outcome: Progressing   Problem: Role Relationship: Goal: Method of communication will improve Outcome: Progressing   Problem: Education: Goal: Individualized Educational Video(s) Outcome: Progressing   Problem: Coping: Goal: Ability to adjust to condition or change in health will improve Outcome: Progressing   Problem: Fluid Volume: Goal: Ability to maintain a balanced intake and output will improve Outcome: Progressing   Problem: Health Behavior/Discharge Planning: Goal: Ability to identify and utilize available resources and services will  improve Outcome: Progressing Goal: Ability to manage health-related needs will improve Outcome: Progressing   Problem: Metabolic: Goal: Ability to maintain appropriate glucose levels will improve Outcome: Progressing   Problem: Nutritional: Goal: Maintenance of adequate nutrition will improve Outcome: Progressing Goal: Progress toward achieving an optimal weight will improve Outcome: Progressing   Problem: Skin Integrity: Goal: Risk for impaired skin integrity will decrease Outcome: Progressing   Problem: Tissue Perfusion: Goal: Adequacy of tissue perfusion will improve Outcome: Progressing   Problem: Safety: Goal: Non-violent Restraint(s) Outcome: Progressing

## 2023-11-27 NOTE — Progress Notes (Signed)
 Notified provider of patient c/o abdominal pain after tube feeds had been resumed. Tube feeds paused and pain medication administered.

## 2023-11-27 NOTE — TOC Progression Note (Signed)
 Transition of Care [facility]) - Progression Note    Patient Details  Name: BRIGET SHAHEED MRN: [id number] Date of Birth: 1946-01-02  Transition of Care [facility]) CM/SW Contact  Mearl Latin, LCSW Phone Number: 11/27/2023, 9:59 AM  Clinical Narrative:    CSW continuing to follow. Patient remains intubated.   Expected Discharge Plan: Home w Home Health Services Barriers to Discharge: Continued Medical Work up  Expected Discharge Plan and Services   Discharge Planning Services: CM Consult Post Acute Care Choice: Home Health Living arrangements for the past 2 months: Single Family Home                                       Social Determinants of Health (SDOH) Interventions SDOH Screenings   Food Insecurity: No Food Insecurity (11/11/2023)  Housing: Low Risk  (11/11/2023)  Transportation Needs: No Transportation Needs (11/11/2023)  Utilities: Not At Risk (11/11/2023)  Depression (PHQ2-9): Low Risk  (06/17/2023)  Financial Resource Strain: Low Risk  (11/06/2023)   Received from [facility]  Social Connections: Unknown (11/11/2023)  Tobacco Use: Medium Risk (11/11/2023)    Readmission Risk Interventions     No data to display

## 2023-11-27 NOTE — Progress Notes (Signed)
 eLink Physician-Brief Progress Note Patient Name: Jenny White DOB: 1946-05-05 MRN: [id number]   Date of Service  11/27/2023  HPI/Events of Note  10 yof presenting with abdominal pain now s/p ex-lap small bowel resection followed by small bowel resection, ileostomy and closure.   Has been having a hard time tolerating tube feeds, they are currently on hold.  KUB was ordered without evidence of any clear obstruction or abnormal gas pattern.  eICU Interventions  Resume trickle tube feeds.   0123 -continues to report 10 out of 10 abdominal pain, this is refractory to fentanyl 100 mcg.  Will add breakthrough pain relief with Dilaudid.  6440 -patient's Foley catheter was clogged with subsequent urinary retention.  Once the Foley catheter was flushed, 850 cc immediately came out.  Pain improved.  As needed Foley irrigation.     Nelani Schmelzle 11/27/2023, 9:31 PM

## 2023-11-27 NOTE — Progress Notes (Signed)
 PHARMACY - ANTICOAGULATION CONSULT NOTE  Pharmacy Consult for heparin Indication: atrial fibrillation  Labs: Recent Labs    11/24/23 0557 11/24/23 1133 11/25/23 0623 11/26/23 0825 11/27/23 0220 11/27/23 0222  HGB 9.0*   < > 9.7* 8.1*  --  8.0*  HCT 30.9*   < > 32.5* 26.7*  --  25.8*  PLT 534*  --  520* 426*  --  414*  HEPARINUNFRC  --   --   --   --  0.70  --   CREATININE 0.73  --  0.77 0.89  --   --    < > = values in this interval not displayed.   Assessment: 78yo female supratherapeutic on heparin after transitioning from LMWH, was previously therapeutic at this rate, LMWH possibly still playing a role; no infusion issues or signs of bleeding per RN.  Goal of Therapy:  Heparin level 0.3-0.5 units/ml   Plan:  Decrease heparin infusion by 1-2 units/kg/hr to 1100 units/hr. Check level in 8 hours.   Vernard Gambles, PharmD, BCPS 11/27/2023 2:50 AM

## 2023-11-28 ENCOUNTER — Inpatient Hospital Stay (HOSPITAL_COMMUNITY)

## 2023-11-28 DIAGNOSIS — J9621 Acute and chronic respiratory failure with hypoxia: Secondary | ICD-10-CM | POA: Diagnosis not present

## 2023-11-28 DIAGNOSIS — J449 Chronic obstructive pulmonary disease, unspecified: Secondary | ICD-10-CM | POA: Diagnosis not present

## 2023-11-28 DIAGNOSIS — J9622 Acute and chronic respiratory failure with hypercapnia: Secondary | ICD-10-CM | POA: Diagnosis not present

## 2023-11-28 DIAGNOSIS — J9601 Acute respiratory failure with hypoxia: Secondary | ICD-10-CM | POA: Diagnosis not present

## 2023-11-28 DIAGNOSIS — A419 Sepsis, unspecified organism: Secondary | ICD-10-CM | POA: Diagnosis not present

## 2023-11-28 LAB — GLUCOSE, CAPILLARY
Glucose-Capillary: 101 mg/dL — ABNORMAL HIGH (ref 70–99)
Glucose-Capillary: 125 mg/dL — ABNORMAL HIGH (ref 70–99)
Glucose-Capillary: 140 mg/dL — ABNORMAL HIGH (ref 70–99)
Glucose-Capillary: 83 mg/dL (ref 70–99)

## 2023-11-28 LAB — BASIC METABOLIC PANEL
Anion gap: 6 (ref 5–15)
BUN: 56 mg/dL — ABNORMAL HIGH (ref 8–23)
CO2: 28 mmol/L (ref 22–32)
Calcium: 8.7 mg/dL — ABNORMAL LOW (ref 8.9–10.3)
Chloride: 112 mmol/L — ABNORMAL HIGH (ref 98–111)
Creatinine, Ser: 0.82 mg/dL (ref 0.44–1.00)
GFR, Estimated: >60 mL/min (ref >60)
Glucose, Bld: 104 mg/dL — ABNORMAL HIGH (ref 70–99)
Potassium: 4.1 mmol/L (ref 3.5–5.1)
Sodium: 146 mmol/L — ABNORMAL HIGH (ref 135–145)

## 2023-11-28 LAB — CBC
HCT: 27.8 % — ABNORMAL LOW (ref 36.0–46.0)
Hemoglobin: 8.5 g/dL — ABNORMAL LOW (ref 12.0–15.0)
MCH: 29.8 pg (ref 26.0–34.0)
MCHC: 30.6 g/dL (ref 30.0–36.0)
MCV: 97.5 fL (ref 80.0–100.0)
Platelets: 479 10*3/uL — ABNORMAL HIGH (ref 150–400)
RBC: 2.85 MIL/uL — ABNORMAL LOW (ref 3.87–5.11)
RDW: 18.4 % — ABNORMAL HIGH (ref 11.5–15.5)
WBC: 13.7 10*3/uL — ABNORMAL HIGH (ref 4.0–10.5)
nRBC: 2 % — ABNORMAL HIGH (ref 0.0–0.2)

## 2023-11-28 LAB — PHOSPHORUS: Phosphorus: 4.9 mg/dL — ABNORMAL HIGH (ref 2.5–4.6)

## 2023-11-28 LAB — HEPARIN LEVEL (UNFRACTIONATED): Heparin Unfractionated: 0.16 [IU]/mL — ABNORMAL LOW (ref 0.30–0.70)

## 2023-11-28 LAB — MAGNESIUM: Magnesium: 2.3 mg/dL (ref 1.7–2.4)

## 2023-11-28 MED ORDER — FENTANYL CITRATE PF 50 MCG/ML IJ SOSY
200.0000 ug | PREFILLED_SYRINGE | Freq: Once | INTRAMUSCULAR | Status: AC
  Administered 2023-11-28: 100 ug via INTRAVENOUS
  Filled 2023-11-28: qty 4

## 2023-11-28 MED ORDER — BANATROL TF EN LIQD
60.0000 mL | Freq: Two times a day (BID) | ENTERAL | Status: DC
  Administered 2023-11-28 – 2023-12-03 (×11): 60 mL
  Filled 2023-11-28 (×11): qty 60

## 2023-11-28 MED ORDER — ROCURONIUM BROMIDE 10 MG/ML (PF) SYRINGE
100.0000 mg | PREFILLED_SYRINGE | Freq: Once | INTRAVENOUS | Status: AC
  Administered 2023-11-28: 100 mg via INTRAVENOUS
  Filled 2023-11-28: qty 10

## 2023-11-28 MED ORDER — ETOMIDATE 2 MG/ML IV SOLN
INTRAVENOUS | Status: AC
Start: 2023-11-28 — End: 2023-11-28
  Filled 2023-11-28: qty 20

## 2023-11-28 MED ORDER — JUVEN PO PACK
1.0000 | PACK | Freq: Two times a day (BID) | ORAL | Status: DC
  Administered 2023-11-28 – 2023-12-11 (×27): 1
  Filled 2023-11-28 (×27): qty 1

## 2023-11-28 MED ORDER — LIDOCAINE-EPINEPHRINE 1 %-1:100000 IJ SOLN
20.0000 mL | Freq: Once | INTRAMUSCULAR | Status: DC
  Filled 2023-11-28: qty 1

## 2023-11-28 MED ORDER — ETOMIDATE 2 MG/ML IV SOLN
20.0000 mg | Freq: Once | INTRAVENOUS | Status: AC
  Administered 2023-11-28: 20 mg via INTRAVENOUS
  Filled 2023-11-28: qty 10

## 2023-11-28 MED ORDER — NUTRISOURCE FIBER PO PACK
1.0000 | PACK | Freq: Every day | ORAL | Status: DC
  Administered 2023-11-28 – 2023-11-30 (×3): 1
  Filled 2023-11-28 (×2): qty 1

## 2023-11-28 MED ORDER — OLANZAPINE 10 MG IM SOLR
5.0000 mg | Freq: Once | INTRAMUSCULAR | Status: DC
  Filled 2023-11-28: qty 10

## 2023-11-28 MED ORDER — VITAL 1.5 CAL PO LIQD
1000.0000 mL | ORAL | Status: DC
  Administered 2023-11-29 – 2023-12-03 (×5): 1000 mL

## 2023-11-28 MED ORDER — HEPARIN (PORCINE) 25000 UT/250ML-% IV SOLN
1200.0000 [IU]/h | INTRAVENOUS | Status: DC
  Filled 2023-11-28: qty 250

## 2023-11-28 MED ORDER — MIDAZOLAM HCL 2 MG/2ML IJ SOLN
5.0000 mg | Freq: Once | INTRAMUSCULAR | Status: AC
  Administered 2023-11-28: 2 mg via INTRAVENOUS
  Filled 2023-11-28: qty 6

## 2023-11-28 MED ORDER — HYDROMORPHONE HCL 1 MG/ML IJ SOLN: 1.0000 mg | INTRAMUSCULAR | Status: DC | PRN

## 2023-11-28 NOTE — Progress Notes (Signed)
 PHARMACY - ANTICOAGULATION CONSULT NOTE  Pharmacy Consult for heparin Indication: atrial fibrillation  No Known Allergies  Patient Measurements: Height: 5' 7.01" (170.2 cm) Weight: 53.4 kg (117 lb 11.6 oz) IBW/kg (Calculated) : 61.62 Heparin Dosing Weight: 49.6 kg   Vital Signs: Temp: 98.3 F (36.8 C) (03/21 1200) Temp Source: Axillary (03/21 1200) BP: 110/54 (03/21 1315) Pulse Rate: 77 (03/21 1315)  Labs: Recent Labs    11/26/23 0825 11/27/23 0220 11/27/23 0222 11/27/23 1028 11/27/23 2323 11/28/23 0431 11/28/23 0900  HGB 8.1*  --  8.0*  --   --   --  8.5*  HCT 26.7*  --  25.8*  --   --   --  27.8*  PLT 426*  --  414*  --   --   --  479*  HEPARINUNFRC  --  0.70  --  0.48 0.16*  --   --   CREATININE 0.89 0.81  --   --   --  0.82  --     Estimated Creatinine Clearance: 47.7 mL/min (by C-G formula based on SCr of 0.82 mg/dL).  Assessment: 30 yof presenting with abdominal pain now s/p ex-lap small bowel resection followed by small bowel resection, ileostomy and closure. Developed Afib RVR and was started on heparin followed by therapeutic lovenox. Pharmacy reconsulted for heparin given possibility of upcoming tracheostomy.   Patient was previously therapeutic on heparin at 1200 units/hr. Last dose of Lovenox at 10:00 3/19.  Underwent tracheostomy today. Per discussion with CCM, will hold off on resuming heparin until tomorrow AM.   Goal of Therapy:  Heparin level 0.3-0.5 units/ml Monitor platelets by anticoagulation protocol: Yes   Plan:  Resume heparin infusion at 1200 units/hr tomorrow at 10:00  Heparin level in 8 hours after resuming and daily with CBC  Monitor for signs/symptoms of bleeding  Thank you for involving pharmacy in this patient's care.  Cedric Fishman, PharmD, BCPS, BCCCP Clinical Pharmacist

## 2023-11-28 NOTE — Progress Notes (Signed)
 SLP Cancellation Note  Patient Details Name: Jenny White MRN: [id number] DOB: 01/14/1946   Cancelled treatment:       Reason Eval/Treat Not Completed: Patient with new tracheostomy. Orders for SLP eval and treat for PMSV and swallowing received. Will follow pt closely for readiness for SLP interventions as appropriate.     Gwynneth Aliment, M.A., CF-SLP Speech Language Pathology, Acute Rehabilitation Services  Secure Chat preferred [phone]  11/28/2023, 3:31 PM

## 2023-11-28 NOTE — Progress Notes (Signed)
 PHARMACY - ANTICOAGULATION Pharmacy Consult for heparin Indication: atrial fibrillation Brief A/P: Heparin level subtherapeutic Increase Heparin rate  No Known Allergies  Patient Measurements: Height: 5' 7.01" (170.2 cm) Weight: 57.7 kg (127 lb 3.3 oz) IBW/kg (Calculated) : 61.62 Heparin Dosing Weight: 49.6 kg   Vital Signs: Temp: 98.7 F (37.1 C) (03/21 0000) Temp Source: Axillary (03/21 0000) BP: 122/54 (03/20 2345) Pulse Rate: 81 (03/20 2345)  Labs: Recent Labs    11/25/23 0623 11/26/23 0825 11/27/23 0220 11/27/23 0222 11/27/23 1028 11/27/23 2323  HGB 9.7* 8.1*  --  8.0*  --   --   HCT 32.5* 26.7*  --  25.8*  --   --   PLT 520* 426*  --  414*  --   --   HEPARINUNFRC  --   --  0.70  --  0.48 0.16*  CREATININE 0.77 0.89 0.81  --   --   --     Estimated Creatinine Clearance: 52.1 mL/min (by C-G formula based on SCr of 0.81 mg/dL).  Assessment: 78 y.o. female with Afib for heparin  Goal of Therapy:  Heparin level 0.3-0.5 units/ml Monitor platelets by anticoagulation protocol: Yes   Plan:  Increase Heparin 1200 units/hr F/U after tracheostomy  Geannie Risen, PharmD, BCPS  11/28/2023 1:04 AM

## 2023-11-28 NOTE — Procedures (Signed)
 Diagnostic Bronchoscopy  Jenny White  [id number]  1946-04-24  Date:11/28/23  Time:3:03 PM   Provider Performing:Mabeline Varas B Shireen Rayburn   Procedure: Diagnostic Bronchoscopy (44034)  Indication(s) Assist with direct visualization of tracheostomy placement  Consent Risks of the procedure as well as the alternatives and risks of each were explained to the patient and/or caregiver.  Consent for the procedure was obtained.   Anesthesia See separate tracheostomy note   Time Out Verified patient identification, verified procedure, site/side was marked, verified correct patient position, special equipment/implants available, medications/allergies/relevant history reviewed, required imaging and test results available.   Sterile Technique Usual hand hygiene, masks, gowns, and gloves were used   Procedure Description Bronchoscope advanced through endotracheal tube and into airway.  After suctioning out tracheal secretions, bronchoscope used to provide direct visualization of tracheostomy placement.   Complications/Tolerance None; patient tolerated the procedure well.   EBL None  Specimen(s) None

## 2023-11-28 NOTE — Plan of Care (Signed)
  Problem: Education: Goal: Knowledge of General Education information will improve Description: Including pain rating scale, medication(s)/side effects and non-pharmacologic comfort measures Outcome: Progressing   Problem: Health Behavior/Discharge Planning: Goal: Ability to manage health-related needs will improve Outcome: Progressing   Problem: Clinical Measurements: Goal: Will remain free from infection Outcome: Progressing Goal: Diagnostic test results will improve Outcome: Progressing Goal: Cardiovascular complication will be avoided Outcome: Progressing   Problem: Education: Goal: Knowledge of General Education information will improve Description: Including pain rating scale, medication(s)/side effects and non-pharmacologic comfort measures Outcome: Progressing   Problem: Health Behavior/Discharge Planning: Goal: Ability to manage health-related needs will improve Outcome: Progressing   Problem: Clinical Measurements: Goal: Will remain free from infection Outcome: Progressing Goal: Diagnostic test results will improve Outcome: Progressing Goal: Cardiovascular complication will be avoided Outcome: Progressing   Problem: Clinical Measurements: Goal: Ability to maintain clinical measurements within normal limits will improve Outcome: Not Progressing Goal: Respiratory complications will improve Outcome: Not Progressing   Problem: Activity: Goal: Risk for activity intolerance will decrease Outcome: Not Progressing   Problem: Coping: Goal: Level of anxiety will decrease Outcome: Not Progressing   Problem: Pain Managment: Goal: General experience of comfort will improve and/or be controlled Outcome: Not Progressing

## 2023-11-28 NOTE — Progress Notes (Signed)
 NAME:  Jenny White, MRN:  [id number], DOB:  16-Jul-1946, LOS: 17 ADMISSION DATE:  11/11/2023 CONSULTATION DATE:  11/11/2023 REFERRING MD:  Dwain Sarna - CCS CHIEF COMPLAINT: Shock  History of Present Illness:  Jenny White 78 year old woman with a pmhx significant for  who presented to the emergency room with complaints of abdominal pain with N/V for the past week. She states the abdominal  pain got worsened after dinner. She reported experiencing nausea and  vomiting this morning.   In the ED  her labs were significant for a lactate 4.1 After fluid resuscitation, Lactate decreased to 2.5. CT scan showed high grade small bowel obstruction with transition point in the right lower quadrant along the distal ileum. Some trace of ascites and mesenteric stranding.Concern for bowel wall ischemia. Broad spectrum antibiotics ( zosyn) was started, blood cultures pending.   Patient was taken to the ER emergently with CCS 3/4 for Ex-lap / LOA. Intraoperative course  was notable for free fluid on entry, with closed loop bowel obstruction noted secondary to tight band adhesion. Patient left in discontinuity/ anastomosis not completed with open abdomen and plan for second look in 24-48 hrs. Patient remained intubated and was transferred to ICU in critical condition.   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 Elevated LA found to have SBO taken emergently to surgery for EX-LAP.  3/7 extubated reintubated 3/10 extubated again 3/11 OFF NIPPV 3/12 got ativan at HS. Sleepy and difficult to arouse all day.  3/13 transferred to ICU for obtundation. Re-intubated.  3/16 Extubated  3/17 zyprexa x 1 overnight,  Obtunded , failed BiPAP,  reintubated 3/17 several conversations with unhappy family members, offered transfer to different facility, called [facility]>> no beds 3/18 resumed BuSpar, gabapentin, stopped oxycodone & fent gtt since oversedated 3/19 Amiodarone IV extravasated LUE 3/20 dex, low dose NE, diuresing   Interim History / Subjective:  Plans for trach today PSV this am but flipped back due to tachypnea and low TVs   Objective:  Blood pressure 120/62, pulse 82, temperature 98.8 F (37.1 C), temperature source Axillary, resp. rate (!) 31, height 5' 7.01" (1.702 m), weight 57.7 kg, SpO2 100%.    Vent Mode: PSV;CPAP FiO2 (%):  [40 %] 40 % Set Rate:  [20 bmp-26 bmp] 26 bmp Vt Set:  [420 mL-490 mL] 420 mL PEEP:  [5 cmH20] 5 cmH20 Pressure Support:  [10 cmH20] 10 cmH20 Plateau Pressure:  [22 cmH20-23 cmH20] 23 cmH20   Intake/Output Summary (Last 24 hours) at 11/28/2023 0831 Last data filed at 11/28/2023 0700 Gross per 24 hour  Intake 1134.77 ml  Output 3500 ml  Net -2365.23 ml   Filed Weights   11/25/23 0500 11/26/23 0500 11/27/23 0500  Weight: 53.3 kg 57.2 kg 57.7 kg   Physical Examination: Dex 0.6 General:  Frail appearing elderly female in bed, tachypneic with mild WOB on PSV 10/5, TV 250s, RR 34 HEENT: MM pink/dry, ETT, cortrak, no elevated JVP, pupils 3/r, anicteric Neuro: Awake, follows simple commands in all extremities, generalized weakness CV: rr, no murmur PULM:  tachypnic with mild WOB/ abd use, diffuse insp wheeze, no secretions GI: soft, bs+, stapled incision, healing, ostomy with dk greenish liquid output, foley, slightly hazy yellow urine Extremities: warm/dry,  dependent UE edema, no tibial edema, dressing to L forearm Skin: no rashes   Afebrile UOP 2.4L/ 24hrs -2.2L Net -3.3L Stool outpt 1L/ 24hrs  Labs> Na 146, K 4.1, sCr 0.82, BUN 76> 56  Assessment & Plan:   Acute on chronic resp failure with hypoxia, hypercarbia ETT 3/4 >>3/7 >> 3/10, 3/13- 3/16, 3/17 >> Severe COPD -FEV1  30% RLL Cancer s/p SBRT - plans for trach today - cont full MV support with daily SBT trials, hopeful to minimize sedation after trach.  Minimize PAD protocol> dex, prn fentanyl, RASS goal 0/-1 - VAP/ PPI - avoid ativan/ zyprexa - Budesonide/Brovana/Yupelri, prn xopenex - ongoing pulm hygiene, PT/ OT  Septic shock HAP:  Aeromonas and Hafnia Alvei pneumonia > both S to meropenmem - remains on low dose NE, goal MAP > 65, suspect 2/2 due to ongoing sedation needs.  If unable to wean off after minimizing sedation, consider adding midodrine - cont meropenem for total of 10-14 days of therapy - trend WBC/ fever curve> remains afebrile, decreasing leukocytosis  New onset paroxymal atrial fibrillation Volume overload, improved - echo 3/6 EF 60-65%, G1DD, mildly reduced RVSF, imaging difficult - heparin off today at 10 for trach.  Restart post procedure> timing defer to Dr.Chand.  Likely transition to DOAC soon - cont amiodarone per tube - optimize electrolytes - hold lasix, appears euvolemic other than mild UE dependent edema.  Assess daily  High grade small bowel obstruction s/p ex lap 3/4 with resection and end ileostomy Severe protein calorie malnutrition - Surgery following for postop care - adding additional fiber today for optimal goal stool output 800-1L - EN per RD, cortrak  Acute metabolic encephalopathy -likely related to hypercarbia Concern for opiate withdrawal Severe anxiety at baseline - cont to minimize dex as able, prn fentanyl  Following commands - cont gabapentin and BuSpar  Diabetes type 2  - CBG q4, SSI prn  Amiodarone extravasation in left arm -s/p hyaluronidase injection - Limb elevation/ wound care  Deconditioning - PT/ OT  Summary -rocky course with repeated reintubations, currently on 4th intubation, due to poor lung function, anxiety, intolerance to sedating medications,abd surgery, and generalized weakness (hospital day 17) has complicated vent  liberation. - 3/20 > Daughter-in-law Jenny White and son, Jenny White agreeable to tracheostomy with the hope of liberating her from the vent.  They do not want her to be in facility care but do want to give her a chance at liberating and returning to previous level of functioning.  It was explained that a tracheostomy does involve long-term rehab and there is a good chance she may not return to her previous level of functioning   Best Practice: (right click and "Reselect all SmartList Selections" daily)   Diet/type: NPO for trach DVT prophylaxis: systemic heparin GI prophylaxis: PPI Lines: PICC Foley:  Yes, and it is still needed Code Status:  full code Last date of multidisciplinary goals of care discussion: ongoing  Pending 3/21, no family at bedside am.   CRITICAL CARE Performed by: Posey Boyer   Total critical care time:  35 minutes  Critical care time was exclusive of separately billable procedures and treating other patients.  Critical care was necessary to treat or prevent imminent or life-threatening deterioration.  Critical care was time spent personally by me on the following activities: development of treatment plan with patient and/or surrogate as well as nursing, discussions with consultants, evaluation of patient's response to treatment, examination of patient, obtaining history from patient or surrogate, ordering and  performing treatments and interventions, ordering and review of laboratory studies, ordering and review of radiographic studies, pulse oximetry, re-evaluation of patient's condition and participation in multidisciplinary rounds.      Posey Boyer, MSN, AG-ACNP-BC [facility] 11/28/2023, 8:31 AM  See Amion for pager If no response to pager , please call [phone] until 7pm After 7:00 pm call Elink  [phone]

## 2023-11-28 NOTE — Progress Notes (Signed)
  15 Days Post-Op   Chief Complaint/Subjective: TD turned off overnight due to abd pain, remain off because she is supposed to get a trach today Ileostomy - 1,050 KUB yesterday with non-obstructive bowel gas pattern, cortrak in D3/4  Objective: Vital signs in last 24 hours: Temp:  [97.1 F (36.2 C)-98.8 F (37.1 C)] 98.8 F (37.1 C) (03/21 0800) Pulse Rate:  [27-92] 82 (03/21 0700) Resp:  [14-40] 31 (03/21 0700) BP: (66-147)/(33-97) 120/62 (03/21 0700) SpO2:  [90 %-100 %] 100 % (03/21 0809) FiO2 (%):  [40 %] 40 % (03/21 0809) Weight:  [53.4 kg] 53.4 kg (03/21 0702) Last BM Date : 11/27/23 Intake/Output from previous day: 03/20 0701 - 03/21 0700 In: 1218.7 [I.V.:658.4; NG/GT:330.2; IV Piggyback:200.1] Out: 3500 [Urine:2450; Stool:1050]  PE: Gen: NAD Resp: full vent support Card: RRR Abd: soft, incision c/d/I, ostomy with thin brown output  Lab Results:  Recent Labs    11/26/23 0825 11/27/23 0222  WBC 20.4* 16.5*  HGB 8.1* 8.0*  HCT 26.7* 25.8*  PLT 426* 414*   Recent Labs    11/27/23 0220 11/28/23 0431  NA 144 146*  K 4.7 4.1  CL 110 112*  CO2 27 28  GLUCOSE 128* 104*  BUN 76* 56*  CREATININE 0.81 0.82  CALCIUM 8.5* 8.7*   No results for input(s): "LABPROT", "INR" in the last 72 hours.    Component Value Date/Time   NA 146 (H) 11/28/2023 0431   K 4.1 11/28/2023 0431   CL 112 (H) 11/28/2023 0431   CO2 28 11/28/2023 0431   GLUCOSE 104 (H) 11/28/2023 0431   BUN 56 (H) 11/28/2023 0431   CREATININE 0.82 11/28/2023 0431   CREATININE 0.68 10/15/2023 1137   CALCIUM 8.7 (L) 11/28/2023 0431   PROT 5.2 (L) 11/25/2023 0623   ALBUMIN 1.7 (L) 11/27/2023 0220   AST 28 11/25/2023 0623   AST 20 10/15/2023 1137   ALT 86 (H) 11/25/2023 0623   ALT 15 10/15/2023 1137   ALKPHOS 103 11/25/2023 0623   BILITOT 0.6 11/25/2023 0623   BILITOT 0.4 10/15/2023 1137   GFRNONAA >60 11/28/2023 0431   GFRNONAA >60 10/15/2023 1137    Assessment/Plan POD 14/12 S/P ex-lap  small bowel resection, left open; ex-lap, small bowel resection, ileostomy and closure, Dr. Dwain Sarna for SBO - ok to resume TF s/p tracheostomy. Add banatrol BID to help thicken stools. May end up needing imodium or iron as well - will monitor for now. - ostomy functional - VTE: on lovenox 60 mg BID - Remainder of care per CCM. Surgery will continue to follow.   LOS: 17 days   I reviewed last 24 h vitals and pain scores, last 48 h intake and output, last 24 h labs and trends, and last 24 h imaging results.  This care required moderate level of medical decision making.   Francine Graven [facility] 11/28/2023, 8:55 AM Please see Amion for pager number during day hours 7:00am-4:30pm or 7:00am -11:30am on weekends

## 2023-11-28 NOTE — Progress Notes (Signed)
 Nutrition Follow-up  DOCUMENTATION CODES:   Severe malnutrition in context of chronic illness (COPD and lung cancer)  INTERVENTION:   Tube feeding via post-pyloric Cortrak tube: Vital 1.5 at 50 ml/h (1200 ml per day)  Prosource TF20 60 ml daily  Provides 1880 kcal, 101 gm protein, 916 ml free water daily  200 ml every 6 hours  Total free water: 1716 ml   Nutrisource fiber/Banatrol TF  Add 1 packet Juven BID, each packet provides 95 calories, 2.5 grams of protein (collagen), and 9.8 grams of carbohydrate (3 grams sugar); also contains 7 grams of L-arginine and L-glutamine, 300 mg vitamin C, 15 mg vitamin E, 1.2 mcg vitamin B-12, 9.5 mg zinc, 200 mg calcium, and 1.5 g  Calcium Beta-hydroxy-Beta-methylbutyrate to support wound healing   NUTRITION DIAGNOSIS:   Severe Malnutrition related to chronic illness (COPD and lung cancer) as evidenced by severe muscle depletion, severe fat depletion. Ongoing.   GOAL:   Patient will meet greater than or equal to 90% of their needs Progressing with advancement of TF  MONITOR:   Vent status, Labs, TF tolerance, I & O's  REASON FOR ASSESSMENT:   Consult Enteral/tube feeding initiation and management  ASSESSMENT:   78 yo female admitted with mixed shock, septic and hypovolemic, high grade SBO with bowel ischemia. PMH includes COPD, GERD, nephrolithiasis, HTN, OSA, lung cancer (adenocarcinoma RLL dx 05/2023)  Pt discussed during ICU rounds and with RN and MD.  Plan for trach today.  Noted pt's TF held due to abd pain later restarted @ 10 ml/hr. Per RN pain likely related to urine retention. Per CCM ok to go back to goal after trach today.   3/04 OR: Ex Lap, SB resection, Abthera wound VAC placement, open abdomen with bowel in discontinuity  3/05 PICC line placed 3/06 Return to OR: Re-opening of recent ex lap, SB resection with end ileostomy, abd wall closure and wound VAC remove 3/07 TPN initiated, Extubated, Re-Intubated 3/10  Extubated  3/11 off BiPAP 3/13 re-intubated, transferred to 4N ICU 3/14 Cortrak placed, currently gastric (per x-ray 3/15, Cortrak tube likely in third portion of duodenum) 3/15 Started trickle TF 3/18 TF to goal; wean TPN   Medications reviewed and include: pepcid, nutrisource fiber x 1, banatrol BID Precedex Levophed @ 4 mcg  Labs reviewed:  Na 146   Diet Order:   Diet Order             Diet NPO time specified  Diet effective now                   EDUCATION NEEDS:   Not appropriate for education at this time  Skin:  Skin Assessment: Skin Integrity Issues: Skin Integrity Issues:: DTI, Other (Comment) DTI: coccyx Wound Vac: removed Incisions: abd Other: skin tear: R and L arm  Last BM:  1050 ml via ileostomy x 24 hours  Height:   Ht Readings from Last 1 Encounters:  11/20/23 5' 7.01" (1.702 m)    Weight:   Wt Readings from Last 1 Encounters:  11/28/23 53.4 kg    Ideal Body Weight:  61.4 kg  BMI:  Body mass index is 18.43 kg/m.  Estimated Nutritional Needs:   Kcal:  1850-2050  Protein:  85-105g  Fluid:  >/= 2L  Jenny Lafont P., RD, LDN, CNSC See AMiON for contact information

## 2023-11-28 NOTE — Procedures (Signed)
 Percutaneous Tracheostomy Procedure Note   Jenny White  [id number]  04/22/1946  Date:11/28/23  Time:2:36 PM   Provider Performing:Apollo Timothy  Procedure: Percutaneous Tracheostomy with Bronchoscopic Guidance (29562)  Indication(s) Acute respiratory failure  Consent Risks of the procedure as well as the alternatives and risks of each were explained to the patient and/or caregiver.  Consent for the procedure was obtained.  Anesthesia Etomidate, Versed, Fentanyl, Vecuronium   Time Out Verified patient identification, verified procedure, site/side was marked, verified correct patient position, special equipment/implants available, medications/allergies/relevant history reviewed, required imaging and test results available.   Sterile Technique Maximal sterile technique including sterile barrier drape, hand hygiene, sterile gown, sterile gloves, mask, hair covering.    Procedure Description Appropriate anatomy identified by palpation.  Patient's neck prepped and draped in sterile fashion.  1% lidocaine with epinephrine was used to anesthetize skin overlying neck.  1.5cm incision made and blunt dissection performed until tracheal rings could be easily palpated.   Then a size 6 Shiley tracheostomy was placed under bronchoscopic visualization using usual Seldinger technique and serial dilation.   Bronchoscope confirmed placement above the carina.  Tracheostomy was sutured in place with adhesive pad to protect skin under pressure.    Patient connected to ventilator.   Complications/Tolerance None; patient tolerated the procedure well. Chest X-ray is ordered to confirm no post-procedural complication.   EBL Minimal   Specimen(s) None

## 2023-11-29 ENCOUNTER — Inpatient Hospital Stay (HOSPITAL_COMMUNITY)

## 2023-11-29 DIAGNOSIS — Z930 Tracheostomy status: Secondary | ICD-10-CM

## 2023-11-29 DIAGNOSIS — J441 Chronic obstructive pulmonary disease with (acute) exacerbation: Secondary | ICD-10-CM

## 2023-11-29 DIAGNOSIS — J9621 Acute and chronic respiratory failure with hypoxia: Secondary | ICD-10-CM | POA: Diagnosis not present

## 2023-11-29 DIAGNOSIS — J9622 Acute and chronic respiratory failure with hypercapnia: Secondary | ICD-10-CM | POA: Diagnosis not present

## 2023-11-29 LAB — RENAL FUNCTION PANEL
Albumin: 1.7 g/dL — ABNORMAL LOW (ref 3.5–5.0)
Anion gap: 5 (ref 5–15)
BUN: 51 mg/dL — ABNORMAL HIGH (ref 8–23)
CO2: 28 mmol/L (ref 22–32)
Calcium: 8.4 mg/dL — ABNORMAL LOW (ref 8.9–10.3)
Chloride: 113 mmol/L — ABNORMAL HIGH (ref 98–111)
Creatinine, Ser: 0.7 mg/dL (ref 0.44–1.00)
GFR, Estimated: >60 mL/min (ref >60)
Glucose, Bld: 131 mg/dL — ABNORMAL HIGH (ref 70–99)
Phosphorus: 2.8 mg/dL (ref 2.5–4.6)
Potassium: 3.7 mmol/L (ref 3.5–5.1)
Sodium: 146 mmol/L — ABNORMAL HIGH (ref 135–145)

## 2023-11-29 LAB — CBC
HCT: 25 % — ABNORMAL LOW (ref 36.0–46.0)
Hemoglobin: 7.7 g/dL — ABNORMAL LOW (ref 12.0–15.0)
MCH: 30.1 pg (ref 26.0–34.0)
MCHC: 30.8 g/dL (ref 30.0–36.0)
MCV: 97.7 fL (ref 80.0–100.0)
Platelets: 390 10*3/uL (ref 150–400)
RBC: 2.56 MIL/uL — ABNORMAL LOW (ref 3.87–5.11)
RDW: 17.8 % — ABNORMAL HIGH (ref 11.5–15.5)
WBC: 10.5 10*3/uL (ref 4.0–10.5)
nRBC: 1 % — ABNORMAL HIGH (ref 0.0–0.2)

## 2023-11-29 LAB — GLUCOSE, CAPILLARY
Glucose-Capillary: 110 mg/dL — ABNORMAL HIGH (ref 70–99)
Glucose-Capillary: 117 mg/dL — ABNORMAL HIGH (ref 70–99)
Glucose-Capillary: 88 mg/dL (ref 70–99)
Glucose-Capillary: 96 mg/dL (ref 70–99)

## 2023-11-29 LAB — MAGNESIUM: Magnesium: 2.1 mg/dL (ref 1.7–2.4)

## 2023-11-29 MED ORDER — MIDODRINE HCL 5 MG PO TABS
5.0000 mg | ORAL_TABLET | Freq: Three times a day (TID) | ORAL | Status: DC
  Administered 2023-11-29 – 2023-12-01 (×6): 5 mg
  Filled 2023-11-29 (×6): qty 1

## 2023-11-29 MED ORDER — FENTANYL CITRATE PF 50 MCG/ML IJ SOSY
25.0000 ug | PREFILLED_SYRINGE | INTRAMUSCULAR | Status: DC | PRN
  Administered 2023-11-29: 50 ug via INTRAVENOUS
  Administered 2023-11-29: 100 ug via INTRAVENOUS
  Administered 2023-11-29: 50 ug via INTRAVENOUS
  Administered 2023-11-30 – 2023-12-01 (×3): 100 ug via INTRAVENOUS
  Administered 2023-12-01 – 2023-12-02 (×2): 50 ug via INTRAVENOUS
  Filled 2023-11-29: qty 2
  Filled 2023-11-29 (×2): qty 1
  Filled 2023-11-29 (×2): qty 2
  Filled 2023-11-29: qty 1
  Filled 2023-11-29 (×2): qty 2
  Filled 2023-11-29: qty 1

## 2023-11-29 MED ORDER — APIXABAN 5 MG PO TABS
5.0000 mg | ORAL_TABLET | Freq: Two times a day (BID) | ORAL | Status: DC
  Administered 2023-11-29 – 2023-12-02 (×8): 5 mg via ORAL
  Filled 2023-11-29 (×8): qty 1

## 2023-11-29 MED ORDER — BETHANECHOL CHLORIDE 10 MG PO TABS
10.0000 mg | ORAL_TABLET | Freq: Three times a day (TID) | ORAL | Status: AC
  Administered 2023-11-29 – 2023-11-30 (×4): 10 mg
  Filled 2023-11-29 (×4): qty 1

## 2023-11-29 MED ORDER — POTASSIUM CHLORIDE 20 MEQ PO PACK
40.0000 meq | PACK | Freq: Once | ORAL | Status: AC
  Administered 2023-11-29: 40 meq
  Filled 2023-11-29: qty 2

## 2023-11-29 MED ORDER — MICONAZOLE NITRATE 2 % EX CREA
TOPICAL_CREAM | Freq: Two times a day (BID) | CUTANEOUS | Status: AC
  Administered 2023-12-01 – 2023-12-08 (×3): 1 via TOPICAL
  Filled 2023-11-29: qty 28.4

## 2023-11-29 MED ORDER — ALBUMIN HUMAN 25 % IV SOLN
25.0000 g | Freq: Four times a day (QID) | INTRAVENOUS | Status: AC
  Administered 2023-11-29: 25 g via INTRAVENOUS
  Administered 2023-11-29: 12.5 g via INTRAVENOUS
  Administered 2023-11-29: 25 g via INTRAVENOUS
  Administered 2023-11-30: 12.5 g via INTRAVENOUS
  Filled 2023-11-29 (×3): qty 100

## 2023-11-29 MED ORDER — LOPERAMIDE HCL 1 MG/7.5ML PO SUSP
2.0000 mg | Freq: Two times a day (BID) | ORAL | Status: DC
  Administered 2023-11-29 – 2023-11-30 (×3): 2 mg
  Filled 2023-11-29 (×3): qty 15

## 2023-11-29 NOTE — Progress Notes (Signed)
 PHARMACY - ANTICOAGULATION CONSULT NOTE  Pharmacy Consult for heparin >> apixaban  Indication: atrial fibrillation  No Known Allergies  Patient Measurements: Height: 5' 7.01" (170.2 cm) Weight: 52.8 kg (116 lb 6.5 oz) IBW/kg (Calculated) : 61.62 Heparin Dosing Weight: 49.6 kg   Vital Signs: Temp: 98.4 F (36.9 C) (03/22 0400) Temp Source: Oral (03/22 0400) BP: 119/44 (03/22 0800) Pulse Rate: 72 (03/22 0800)  Labs: Recent Labs    11/27/23 0220 11/27/23 0222 11/27/23 0222 11/27/23 1028 11/27/23 2323 11/28/23 0431 11/28/23 0900 11/29/23 0438  HGB  --  8.0*   < >  --   --   --  8.5* 7.7*  HCT  --  25.8*  --   --   --   --  27.8* 25.0*  PLT  --  414*  --   --   --   --  479* 390  HEPARINUNFRC 0.70  --   --  0.48 0.16*  --   --   --   CREATININE 0.81  --   --   --   --  0.82  --  0.70   < > = values in this interval not displayed.    Estimated Creatinine Clearance: 48.3 mL/min (by C-G formula based on SCr of 0.7 mg/dL).  Assessment: 47 yof presenting with abdominal pain now s/p ex-lap small bowel resection followed by small bowel resection, ileostomy and closure. Developed Afib RVR and was started on heparin followed by therapeutic lovenox. Patient is s/p tracheostomy 3/21. Pharmacy consulted for apixaban.   Goal of Therapy:  Heparin level 0.3-0.5 units/ml Monitor platelets by anticoagulation protocol: Yes   Plan:  Stop heparin order Apixaban 5mg  BID  Monitor for signs/symptoms of bleeding  Thank you for involving pharmacy in this patient's care.  Alphia Moh, PharmD, BCPS, BCCP Clinical Pharmacist  Please check AMION for all [facility] Pharmacy phone numbers After 10:00 PM, call Main Pharmacy [phone]

## 2023-11-29 NOTE — Discharge Instructions (Addendum)
 WOUND CARE: - midline dressing to be changed daily - supplies: sterile saline, gauze, scissors, tape  - remove dressing and all packing carefully, moistening with sterile saline as needed to avoid packing/internal dressing sticking to the wound. - clean edges of skin around the wound with water/gauze, making sure there is no tape debris or leakage left on skin that could cause skin irritation or breakdown. - dampen and clean gauze with sterile saline and pack wound from wound base to skin level, making sure to take note of any possible areas of wound tracking, tunneling and packing appropriately. Wound can be packed loosely. Trim gauze to size if a whole gauze is not required. - cover wound with a dry gauze and secure with tape.  - write the date/time on the dry dressing/tape to better track when the last dressing change occurred. - change dressing as needed if leakage occurs, wound gets contaminated, or patient requests to shower. - patient may shower daily with wound open (i.e. remove all packing) and following the shower the wound should be dried and a clean dressing placed.     Information on my medicine - ELIQUIS (apixaban)  This medication education was reviewed with me or my healthcare representative as part of my discharge preparation.    Why was Eliquis prescribed for you? Eliquis was prescribed for you to reduce the risk of a blood clot forming that can cause a stroke if you have a medical condition called atrial fibrillation (a type of irregular heartbeat).  What do You need to know about Eliquis ? Take your Eliquis TWICE DAILY - one tablet in the morning and one tablet in the evening with or without food. If you have difficulty swallowing the tablet whole please discuss with your pharmacist how to take the medication safely.  Take Eliquis exactly as prescribed by your doctor and DO NOT stop taking Eliquis without talking to the doctor who prescribed the medication.  Stopping  may increase your risk of developing a stroke.  Refill your prescription before you run out.  After discharge, you should have regular check-up appointments with your healthcare provider that is prescribing your Eliquis.  In the future your dose may need to be changed if your kidney function or weight changes by a significant amount or as you get older.  What do you do if you miss a dose? If you miss a dose, take it as soon as you remember on the same day and resume taking twice daily.  Do not take more than one dose of ELIQUIS at the same time to make up a missed dose.  Important Safety Information A possible side effect of Eliquis is bleeding. You should call your healthcare provider right away if you experience any of the following: Bleeding from an injury or your nose that does not stop. Unusual colored urine (red or dark brown) or unusual colored stools (red or black). Unusual bruising for unknown reasons. A serious fall or if you hit your head (even if there is no bleeding).  Some medicines may interact with Eliquis and might increase your risk of bleeding or clotting while on Eliquis. To help avoid this, consult your healthcare provider or pharmacist prior to using any new prescription or non-prescription medications, including herbals, vitamins, non-steroidal anti-inflammatory drugs (NSAIDs) and supplements.  This website has more information on Eliquis (apixaban): http://www.eliquis.com/eliquis/home ============================  Atrial Fibrillation    Atrial fibrillation is a type of heartbeat that is irregular or fast. If you have this condition,  your heart beats without any order. This makes it hard for your heart to pump blood in a normal way. Atrial fibrillation may come and go, or it may become a long-lasting problem. If this condition is not treated, it can put you at higher risk for stroke, heart failure, and other heart problems.  What are the causes? This condition  may be caused by diseases that damage the heart. They include: High blood pressure. Heart failure. Heart valve disease. Heart surgery. Other causes include: Diabetes. Thyroid disease. Being overweight. Kidney disease. Sometimes the cause is not known.  What increases the risk? You are more likely to develop this condition if: You are older. You smoke. You exercise often and very hard. You have a family history of this condition. You are a man. You use drugs. You drink a lot of alcohol. You have lung conditions, such as emphysema, pneumonia, or COPD. You have sleep apnea.  What are the signs or symptoms? Common symptoms of this condition include: A feeling that your heart is beating very fast. Chest pain or discomfort. Feeling short of breath. Suddenly feeling light-headed or weak. Getting tired easily during activity. Fainting. Sweating. In some cases, there are no symptoms.  How is this treated? Treatment for this condition depends on underlying conditions and how you feel when you have atrial fibrillation. They include: Medicines to: Prevent blood clots. Treat heart rate or heart rhythm problems. Using devices, such as a pacemaker, to correct heart rhythm problems. Doing surgery to remove the part of the heart that sends bad signals. Closing an area where clots can form in the heart (left atrial appendage). In some cases, your doctor will treat other underlying conditions.  Follow these instructions at home:  Medicines Take over-the-counter and prescription medicines only as told by your doctor. Do not take any new medicines without first talking to your doctor. If you are taking blood thinners: Talk with your doctor before you take any medicines that have aspirin or NSAIDs, such as ibuprofen, in them. Take your medicine exactly as told by your doctor. Take it at the same time each day. Avoid activities that could hurt or bruise you. Follow instructions about  how to prevent falls. Wear a bracelet that says you are taking blood thinners. Or, carry a card that lists what medicines you take. Lifestyle         Do not use any products that have nicotine or tobacco in them. These include cigarettes, e-cigarettes, and chewing tobacco. If you need help quitting, ask your doctor. Eat heart-healthy foods. Talk with your doctor about the right eating plan for you. Exercise regularly as told by your doctor. Do not drink alcohol. Lose weight if you are overweight. Do not use drugs, including cannabis.  General instructions If you have a condition that causes breathing to stop for a short period of time (apnea), treat it as told by your doctor. Keep a healthy weight. Do not use diet pills unless your doctor says they are safe for you. Diet pills may make heart problems worse. Keep all follow-up visits as told by your doctor. This is important.  Contact a doctor if: You notice a change in the speed, rhythm, or strength of your heartbeat. You are taking a blood-thinning medicine and you get more bruising. You get tired more easily when you move or exercise. You have a sudden change in weight.  Get help right away if:    You have pain in your chest or your  belly (abdomen). You have trouble breathing. You have side effects of blood thinners, such as blood in your vomit, poop (stool), or pee (urine), or bleeding that cannot stop. You have any signs of a stroke. "BE FAST" is an easy way to remember the main warning signs: B - Balance. Signs are dizziness, sudden trouble walking, or loss of balance. E - Eyes. Signs are trouble seeing or a change in how you see. F - Face. Signs are sudden weakness or loss of feeling in the face, or the face or eyelid drooping on one side. A - Arms. Signs are weakness or loss of feeling in an arm. This happens suddenly and usually on one side of the body. S - Speech. Signs are sudden trouble speaking, slurred speech, or  trouble understanding what people say. T - Time. Time to call emergency services. Write down what time symptoms started. You have other signs of a stroke, such as: A sudden, very bad headache with no known cause. Feeling like you may vomit (nausea). Vomiting. A seizure.  These symptoms may be an emergency. Do not wait to see if the symptoms will go away. Get medical help right away. Call your local emergency services (911 in the [place] Do not drive yourself to the hospital. Summary Atrial fibrillation is a type of heartbeat that is irregular or fast. You are at higher risk of this condition if you smoke, are older, have diabetes, or are overweight. Follow your doctor's instructions about medicines, diet, exercise, and follow-up visits. Get help right away if you have signs or symptoms of a stroke. Get help right away if you cannot catch your breath, or you have chest pain or discomfort. This information is not intended to replace advice given to you by your health care provider. Make sure you discuss any questions you have with your health care provider. Document Revised: 02/17/2019 Document Reviewed: 02/17/2019 Elsevier Patient Education  2020 [employer].

## 2023-11-29 NOTE — Progress Notes (Signed)
 PT Cancellation Note  Patient Details Name: Jenny White MRN: [id number] DOB: 10/13/1945   Cancelled Treatment:    Reason Eval/Treat Not Completed: Fatigue/lethargy limiting ability to participate - pt very fatigued from trialing trach collar today, will check on her tomorrow am for PT evaluation.   Marye Round, PT DPT [facility] Secure Chat Preferred  Office [phone]    Truddie Coco 11/29/2023, 2:39 PM

## 2023-11-29 NOTE — Progress Notes (Signed)
 NAME:  Jenny White, MRN:  [id number], DOB:  [birth year], LOS: 18 ADMISSION DATE:  11/11/2023 CONSULTATION DATE:  11/11/2023 REFERRING MD:  Dwain Sarna - [facility] CHIEF COMPLAINT: Shock  History of Present Illness:  Jenny White 78 year old woman with a pmhx significant for  who presented to the emergency room with complaints of abdominal pain with N/V for the past week. She states the abdominal  pain got worsened after dinner. She reported experiencing nausea and  vomiting this morning.   In the ED  her labs were significant for a lactate 4.1 After fluid resuscitation, Lactate decreased to 2.5. CT scan showed high grade small bowel obstruction with transition point in the right lower quadrant along the distal ileum. Some trace of ascites and mesenteric stranding.Concern for bowel wall ischemia. Broad spectrum antibiotics ( zosyn) was started, blood cultures pending.   Patient was taken to the ER emergently with CCS 3/4 for Ex-lap / LOA. Intraoperative course  was notable for free fluid on entry, with closed loop bowel obstruction noted secondary to tight band adhesion. Patient left in discontinuity/ anastomosis not completed with open abdomen and plan for second look in 24-48 hrs. Patient remained intubated and was transferred to ICU in critical condition.   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 Elevated LA found to have SBO taken emergently to surgery for EX-LAP.  3/7 extubated reintubated 3/10 extubated again 3/11 OFF NIPPV 3/12 got ativan at HS. Sleepy and difficult to arouse all day.  3/13 transferred to ICU for obtundation. Re-intubated.  3/16 Extubated  3/17 zyprexa x 1 overnight,  Obtunded , failed BiPAP,  reintubated 3/17 several conversations with unhappy family members, offered transfer to different facility, called [facility]>> no beds 3/18 resumed BuSpar, gabapentin, stopped oxycodone & fent gtt since oversedated 3/19 Amiodarone IV extravasated LUE 3/20 dex, low dose NE, diuresing  3/21 tracheostomy placed at bedside  Interim History / Subjective:   Tracheostomy placed yesterday No events overnight No bleeding at trach site Foley removed this morning  Objective:  Blood pressure (!) 119/44, pulse 72, temperature 98.4 F (36.9 C), temperature source Oral, resp. rate (!) 35, height 5' 7.01" (1.702 m), weight 52.8 kg, SpO2 100%.    Vent Mode: PSV;CPAP FiO2 (%):  [40 %] 40 % Set Rate:  [26 bmp] 26 bmp Vt Set:  [420 mL] 420 mL PEEP:  [5 cmH20] 5 cmH20 Pressure Support:  [10 cmH20] 10 cmH20 Plateau Pressure:  [17 cmH20] 17 cmH20   Intake/Output Summary (Last 24 hours) at 11/29/2023 0818 Last data filed at 11/29/2023 0800 Gross per 24 hour  Intake 2801.86 ml  Output 3245 ml  Net -443.14 ml   Filed Weights   11/27/23 0500 11/28/23 0702 11/29/23 0500  Weight: 57.7 kg 53.4 kg 52.8 kg   Physical Examination: General:  Frail appearing elderly woman, no acute distress HEENT: MM pink/dry, ETT, cortrak Neuro: Awake, follows simple commands in all extremities, generalized weakness CV: rr, no murmur PULM:  tachypnic, no wheezing GI: soft but tender LLQ, bs+, stapled incision, healing, ostomy with liquid output Extremities: warm/dry, dependent UE edema, no tibial edema, dressing to L forearm Skin: no rashes    Assessment & Plan:   Acute on chronic resp failure with hypoxia,  hypercarbia ETT 3/4 >>3/7 >> 3/10, 3/13- 3/16, 3/17 >>3/21 Severe COPD -FEV1 30% RLL Cancer s/p SBRT S/p tracheostomy 3/21 - cont full MV support with daily SBT trials, trach collar trial if PSV trial ok - trach care - VAP/ PPI - avoid ativan/ zyprexa - Budesonide/Brovana/Yupelri, prn xopenex - ongoing pulm  hygiene, PT/ OT - stop precedex, PRN fentanyl  Septic shock HAP:  Aeromonas and Hafnia Alvei pneumonia > both S to meropenmem - remains on low dose NE, goal MAP > 65, suspect 2/2 due to ongoing sedation needs - cont meropenem for total of 10-14 days of therapy - start midodrine - albumin 25g q6hrs x 4 doses  New onset paroxymal atrial fibrillation Volume overload, improved - echo 3/6 EF 60-65%, G1DD, mildly reduced RVSF, imaging difficult - transition to eliquis today - cont amiodarone per tube - optimize electrolytes - hold lasix, appears euvolemic other than mild UE dependent edema.  Assess daily  High grade small bowel obstruction s/p ex lap 3/4 with resection and end ileostomy Severe protein calorie malnutrition - Surgery following for postop care - fiber for optimal goal stool output 800-1L - EN per RD, cortrak - check KUB for abdominal pain/tenderness  Acute metabolic encephalopathy -likely related to hypercarbia Concern for opiate withdrawal Severe anxiety at baseline - stop precedex, prn fentanyl  Following commands - cont gabapentin and BuSpar - will consider adding low dose oxycodone or klonipin  Diabetes type 2  - CBG q4, SSI prn  Amiodarone extravasation in left arm -s/p hyaluronidase injection - Limb elevation/ wound care  Deconditioning - PT/ OT  Summary -rocky course with repeated reintubations, currently on 4th intubation, due to poor lung function, anxiety, intolerance to sedating medications,abd surgery, and generalized weakness (hospital day 17) has complicated vent liberation. - 3/20 > Daughter-in-law [name] and son, [name] agreeable to tracheostomy with the hope of liberating her from the vent.  They do not want her to be in facility care but do want to give her a chance at liberating and returning to previous level of functioning.  It was explained that a tracheostomy does involve long-term rehab and there is a good chance she may not return to her  previous level of functioning   Best Practice: (right click and "Reselect all SmartList Selections" daily)   Diet/type: tubefeeds DVT prophylaxis: DOAC GI prophylaxis: PPI Lines: PICC Foley:  N/A Code Status:  full code Last date of multidisciplinary goals of care discussion: ongoing  Pending 3/21, no family at bedside am.   CRITICAL CARE Performed by: Martina Sinner   Total critical care time:  45 minutes  Critical care time was exclusive of separately billable procedures and treating other patients.  Critical care was necessary to treat or prevent imminent or life-threatening deterioration.  Critical care was time spent personally by me on the following activities: development of treatment plan with patient and/or surrogate as well as nursing, discussions with consultants, evaluation of patient's response to treatment, examination of patient, obtaining history from patient or surrogate, ordering and performing treatments and interventions, ordering and review of laboratory studies, ordering and review of radiographic studies, pulse oximetry, re-evaluation of patient's condition and participation in multidisciplinary rounds.      Melody Comas, MD [facility] Office: [phone]   See Amion for personal pager PCCM on call pager [phone] until 7pm. Please call Elink 7p-7a. [phone]

## 2023-11-29 NOTE — Progress Notes (Signed)
 Patient ID: Jenny White, female   DOB: 10/28/1945, 78 y.o.   MRN: [id number] 16 Days Post-Op    Subjective: On vent, alert ROS negative except as listed above. Objective: Vital signs in last 24 hours: Temp:  [97.6 F (36.4 C)-98.4 F (36.9 C)] 98.2 F (36.8 C) (03/22 0800) Pulse Rate:  [58-204] 72 (03/22 0800) Resp:  [16-35] 35 (03/22 0800) BP: (76-152)/(38-95) 119/44 (03/22 0800) SpO2:  [74 %-100 %] 100 % (03/22 0800) FiO2 (%):  [40 %] 40 % (03/22 0733) Weight:  [52.8 kg] 52.8 kg (03/22 0500) Last BM Date : 11/29/23  Intake/Output from previous day: 03/21 0701 - 03/22 0700 In: 2832.3 [I.V.:633.9; WU/JW:1191.4; IV Piggyback:200] Out: 3245 [Urine:1670; Stool:1575] Intake/Output this shift: Total I/O In: 64.7 [I.V.:14.7; NG/GT:50] Out: -   General appearance: cooperative GI: soft, incision OK, liquid stool in ostomy, mild L sided tenderness, no peritonitis  Lab Results: CBC  Recent Labs    11/28/23 0900 11/29/23 0438  WBC 13.7* 10.5  HGB 8.5* 7.7*  HCT 27.8* 25.0*  PLT 479* 390   BMET Recent Labs    11/28/23 0431 11/29/23 0438  NA 146* 146*  K 4.1 3.7  CL 112* 113*  CO2 28 28  GLUCOSE 104* 131*  BUN 56* 51*  CREATININE 0.82 0.70  CALCIUM 8.7* 8.4*   PT/INR No results for input(s): "LABPROT", "INR" in the last 72 hours. ABG No results for input(s): "PHART", "HCO3" in the last 72 hours.  Invalid input(s): "PCO2", "PO2"  Studies/Results: DG Chest Port 1 View Result Date: 11/28/2023 CLINICAL DATA:  Tracheostomy EXAM: PORTABLE CHEST 1 VIEW COMPARISON:  11/26/2023, 11/24/2023, 11/21/2023 FINDINGS: Interval tracheostomy, tip of the tube is 4.7 cm superior to carina. Right upper extremity central venous catheter tip over the SVC. Enteric tube tip below the diaphragm but incompletely assessed. Irregular left suprahilar opacity with fiducial marker. Small left-sided effusion with persistent dense left lung base airspace disease. Trace right pleural effusion.  IMPRESSION: 1. Interval tracheostomy. 2. Persistent small left effusion with dense left lung base airspace disease. Trace right effusion. Aeration does not appear significantly changed compared with 11/26/2023. Electronically Signed   By: Jasmine Pang M.D.   On: 11/28/2023 16:39   DG Abd Portable 1V Result Date: 11/27/2023 CLINICAL DATA:  Abdominal discomfort, recent laparotomy and small-bowel resection EXAM: PORTABLE ABDOMEN - 1 VIEW COMPARISON:  11/22/2023 FINDINGS: 2 supine frontal views of the abdomen and pelvis are obtained. Enteric catheter passes below diaphragm, tip overlying the region of the distal duodenum. No bowel obstruction or ileus. No masses or abnormal calcifications. Persistent retrocardiac consolidation most consistent with atelectasis. No acute bony abnormalities. IMPRESSION: 1. Enteric catheter tip projecting over distal duodenum. 2. Unremarkable bowel gas pattern.  No obstruction or ileus. 3. Stable retrocardiac consolidation consistent with atelectasis. Electronically Signed   By: Sharlet Salina M.D.   On: 11/27/2023 20:10    Anti-infectives: Anti-infectives (From admission, onward)    Start     Dose/Rate Route Frequency Ordered Stop   11/25/23 1145  meropenem (MERREM) 1 g in sodium chloride 0.9 % 100 mL IVPB        1 g 200 mL/hr over 30 Minutes Intravenous Every 12 hours 11/25/23 1045 12/02/23 0959   11/20/23 1045  tobramycin (PF) (TOBI) nebulizer solution 300 mg  Status:  Discontinued        300 mg Nebulization 2 times daily 11/20/23 0952 11/23/23 1133   11/17/23 1000  meropenem (MERREM) 1 g in sodium chloride 0.9 %  100 mL IVPB  Status:  Discontinued        1 g 200 mL/hr over 30 Minutes Intravenous Every 12 hours 11/17/23 0832 11/23/23 1133   11/15/23 1400  piperacillin-tazobactam (ZOSYN) IVPB 3.375 g  Status:  Discontinued        3.375 g 100 mL/hr over 30 Minutes Intravenous Every 8 hours 11/15/23 1223 11/17/23 0832   11/11/23 2000  piperacillin-tazobactam (ZOSYN)  IVPB 3.375 g  Status:  Discontinued        3.375 g 12.5 mL/hr over 240 Minutes Intravenous Every 8 hours 11/11/23 1233 11/15/23 1223   11/11/23 1245  piperacillin-tazobactam (ZOSYN) IVPB 3.375 g        3.375 g 100 mL/hr over 30 Minutes Intravenous  Once 11/11/23 1233 11/11/23 1316       Assessment/Plan: POD 15/13 S/P ex-lap small bowel resection, left open; ex-lap, small bowel resection, ileostomy and closure, Dr. Dwain Sarna for SBO - banatrol BID to help thicken stools. Add immodium BID today - CCM ordered ABD film as mild TTP on L - will check - ostomy functional - VTE: on lovenox 60 mg BID - Remainder of care per CCM. Surgery will continue to follow.   LOS: 18 days    Violeta Gelinas, MD, MPH, FACS [facility] Use [email or web address] to contact on call provider  11/29/2023

## 2023-11-29 NOTE — Plan of Care (Signed)
  Problem: Clinical Measurements: Goal: Ability to maintain clinical measurements within normal limits will improve Outcome: Progressing Goal: Respiratory complications will improve Outcome: Progressing Goal: Cardiovascular complication will be avoided Outcome: Progressing   Problem: Coping: Goal: Level of anxiety will decrease Outcome: Progressing   Problem: Pain Managment: Goal: General experience of comfort will improve and/or be controlled Outcome: Progressing   Problem: Respiratory: Goal: Ability to maintain a clear airway and adequate ventilation will improve Outcome: Progressing   Problem: Role Relationship: Goal: Method of communication will improve Outcome: Progressing   Problem: Nutritional: Goal: Maintenance of adequate nutrition will improve Outcome: Progressing

## 2023-11-30 DIAGNOSIS — J9622 Acute and chronic respiratory failure with hypercapnia: Secondary | ICD-10-CM | POA: Diagnosis not present

## 2023-11-30 DIAGNOSIS — J441 Chronic obstructive pulmonary disease with (acute) exacerbation: Secondary | ICD-10-CM | POA: Diagnosis not present

## 2023-11-30 DIAGNOSIS — J9621 Acute and chronic respiratory failure with hypoxia: Secondary | ICD-10-CM | POA: Diagnosis not present

## 2023-11-30 DIAGNOSIS — Z930 Tracheostomy status: Secondary | ICD-10-CM | POA: Diagnosis not present

## 2023-11-30 LAB — RENAL FUNCTION PANEL
Albumin: 3.1 g/dL — ABNORMAL LOW (ref 3.5–5.0)
Anion gap: 5 (ref 5–15)
BUN: 35 mg/dL — ABNORMAL HIGH (ref 8–23)
CO2: 31 mmol/L (ref 22–32)
Calcium: 10 mg/dL (ref 8.9–10.3)
Chloride: 119 mmol/L — ABNORMAL HIGH (ref 98–111)
Creatinine, Ser: 0.55 mg/dL (ref 0.44–1.00)
GFR, Estimated: >60 mL/min (ref >60)
Glucose, Bld: 122 mg/dL — ABNORMAL HIGH (ref 70–99)
Phosphorus: 2.2 mg/dL — ABNORMAL LOW (ref 2.5–4.6)
Potassium: 3.6 mmol/L (ref 3.5–5.1)
Sodium: 155 mmol/L — ABNORMAL HIGH (ref 135–145)

## 2023-11-30 LAB — MAGNESIUM: Magnesium: 2 mg/dL (ref 1.7–2.4)

## 2023-11-30 LAB — GLUCOSE, CAPILLARY
Glucose-Capillary: 86 mg/dL (ref 70–99)
Glucose-Capillary: 93 mg/dL (ref 70–99)

## 2023-11-30 LAB — CBC
HCT: 31.5 % — ABNORMAL LOW (ref 36.0–46.0)
Hemoglobin: 9.4 g/dL — ABNORMAL LOW (ref 12.0–15.0)
MCH: 29.5 pg (ref 26.0–34.0)
MCHC: 29.8 g/dL — ABNORMAL LOW (ref 30.0–36.0)
MCV: 98.7 fL (ref 80.0–100.0)
Platelets: 310 10*3/uL (ref 150–400)
RBC: 3.19 MIL/uL — ABNORMAL LOW (ref 3.87–5.11)
RDW: 17.9 % — ABNORMAL HIGH (ref 11.5–15.5)
WBC: 8.8 10*3/uL (ref 4.0–10.5)
nRBC: 0.7 % — ABNORMAL HIGH (ref 0.0–0.2)

## 2023-11-30 MED ORDER — FREE WATER
200.0000 mL | Status: DC
  Administered 2023-11-30 – 2023-12-01 (×6): 200 mL

## 2023-11-30 MED ORDER — BUSPIRONE HCL 10 MG PO TABS
10.0000 mg | ORAL_TABLET | Freq: Two times a day (BID) | ORAL | Status: DC
  Administered 2023-11-30 – 2023-12-06 (×13): 10 mg
  Filled 2023-11-30 (×14): qty 1

## 2023-11-30 MED ORDER — LOPERAMIDE HCL 1 MG/7.5ML PO SUSP
4.0000 mg | Freq: Two times a day (BID) | ORAL | Status: DC
  Administered 2023-11-30: 4 mg
  Filled 2023-11-30: qty 30

## 2023-11-30 MED ORDER — POTASSIUM & SODIUM PHOSPHATES 280-160-250 MG PO PACK
2.0000 | PACK | ORAL | Status: AC
  Administered 2023-11-30 (×3): 2
  Filled 2023-11-30 (×3): qty 2

## 2023-11-30 NOTE — Progress Notes (Signed)
 Patient ID: Jenny White, female   DOB: 26-Nov-1945, 78 y.o.   MRN: [id number] 17 Days Post-Op    Subjective: On HTC ROS negative except as listed above. Objective: Vital signs in last 24 hours: Temp:  [97.6 F (36.4 C)-98.3 F (36.8 C)] 98.1 F (36.7 C) (03/23 0800) Pulse Rate:  [84-109] 95 (03/23 0800) Resp:  [18-33] 32 (03/23 0800) BP: (100-160)/(47-83) 127/56 (03/23 0800) SpO2:  [92 %-100 %] 98 % (03/23 0800) FiO2 (%):  [40 %] 40 % (03/23 0800) Weight:  [52.1 kg] 52.1 kg (03/23 0356) Last BM Date : 11/30/23  Intake/Output from previous day: 03/22 0701 - 03/23 0700 In: 1877.7 [I.V.:47.1; NG/GT:1410; IV Piggyback:420.7] Out: 2450 [Urine:1850; Stool:600] Intake/Output this shift: Total I/O In: 100 [NG/GT:100] Out: -   GI: soft, NT today, incision OK, a lot of liquid stool in bag  Lab Results: CBC  Recent Labs    11/29/23 0438 11/30/23 0410  WBC 10.5 8.8  HGB 7.7* 9.4*  HCT 25.0* 31.5*  PLT 390 310   BMET Recent Labs    11/29/23 0438 11/30/23 0410  NA 146* 155*  K 3.7 3.6  CL 113* 119*  CO2 28 31  GLUCOSE 131* 122*  BUN 51* 35*  CREATININE 0.70 0.55  CALCIUM 8.4* 10.0   PT/INR No results for input(s): "LABPROT", "INR" in the last 72 hours. ABG No results for input(s): "PHART", "HCO3" in the last 72 hours.  Invalid input(s): "PCO2", "PO2"  Studies/Results: DG Abd 1 View Result Date: 11/29/2023 CLINICAL DATA:  Abdominal pain. Status post exploratory laparotomy with bowel resection and ileostomy. EXAM: ABDOMEN - 1 VIEW COMPARISON:  One-view abdomen 11/27/2023. FINDINGS: A small bore feeding tube is stable in position over the distal duodenum. Bowel gas pattern is within normal limits. Contrast in the proximal ascending colon is stable. Postoperative changes of the lumbar spine are noted. IMPRESSION: 1. Stable position of small bore feeding tube over the distal duodenum. 2. No acute abnormality. Electronically Signed   By: Marin Roberts M.D.   On:  11/29/2023 12:48   DG Chest Port 1 View Result Date: 11/28/2023 CLINICAL DATA:  Tracheostomy EXAM: PORTABLE CHEST 1 VIEW COMPARISON:  11/26/2023, 11/24/2023, 11/21/2023 FINDINGS: Interval tracheostomy, tip of the tube is 4.7 cm superior to carina. Right upper extremity central venous catheter tip over the SVC. Enteric tube tip below the diaphragm but incompletely assessed. Irregular left suprahilar opacity with fiducial marker. Small left-sided effusion with persistent dense left lung base airspace disease. Trace right pleural effusion. IMPRESSION: 1. Interval tracheostomy. 2. Persistent small left effusion with dense left lung base airspace disease. Trace right effusion. Aeration does not appear significantly changed compared with 11/26/2023. Electronically Signed   By: Jasmine Pang M.D.   On: 11/28/2023 16:39    Anti-infectives: Anti-infectives (From admission, onward)    Start     Dose/Rate Route Frequency Ordered Stop   11/25/23 1145  meropenem (MERREM) 1 g in sodium chloride 0.9 % 100 mL IVPB        1 g 200 mL/hr over 30 Minutes Intravenous Every 12 hours 11/25/23 1045 12/02/23 0959   11/20/23 1045  tobramycin (PF) (TOBI) nebulizer solution 300 mg  Status:  Discontinued        300 mg Nebulization 2 times daily 11/20/23 0952 11/23/23 1133   11/17/23 1000  meropenem (MERREM) 1 g in sodium chloride 0.9 % 100 mL IVPB  Status:  Discontinued        1 g 200 mL/hr over  30 Minutes Intravenous Every 12 hours 11/17/23 0832 11/23/23 1133   11/15/23 1400  piperacillin-tazobactam (ZOSYN) IVPB 3.375 g  Status:  Discontinued        3.375 g 100 mL/hr over 30 Minutes Intravenous Every 8 hours 11/15/23 1223 11/17/23 0832   11/11/23 2000  piperacillin-tazobactam (ZOSYN) IVPB 3.375 g  Status:  Discontinued        3.375 g 12.5 mL/hr over 240 Minutes Intravenous Every 8 hours 11/11/23 1233 11/15/23 1223   11/11/23 1245  piperacillin-tazobactam (ZOSYN) IVPB 3.375 g        3.375 g 100 mL/hr over 30 Minutes  Intravenous  Once 11/11/23 1233 11/11/23 1316       Assessment POD 16/14 S/P ex-lap small bowel resection, left open; ex-lap, small bowel resection, ileostomy and closure, Dr. Dwain Sarna for SBO - banatrol BID to help thicken stools. 1400cc/24h. Increase immodium BID today - on HTC this AM - VTE: on lovenox 60 mg BID - Remainder of care per CCM. Surgery will continue to follow. nt/Plan:   LOS: 19 days    Violeta Gelinas, MD, MPH, FACS Trauma & General Surgery Use [email or web address] to contact on call provider  11/30/2023

## 2023-11-30 NOTE — Evaluation (Signed)
 Physical Therapy Evaluation Patient Details Name: Jenny White MRN: [id number] DOB: July 21, 1946 Today's Date: 11/30/2023  History of Present Illness  78 yo female presenting with n/v on 3/4. CT(+) SBO. 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 Trach placed 3/21. PMH COPD, GERD, nephrolithiasis, HTN, OSA, lung CA (05/2023) s/p radiation, arthritis, spinal stenosis L3-4 fusion.  Clinical Impression   Pt presents with debility, poor sitting balance in chair position in bed, L foot drop, and decreased activity tolerance. Pt to benefit from acute PT to address deficits. Pt requiring max assist for repositioning, chair position and pull-to-sit. Unable to progress beyond this today given fatigue and R lateral bias, but anticipate continued progress to EOB and OOB with acute therapies. Pt and family eager for pt to maximize functional recovery, Patient will benefit from intensive inpatient follow-up therapy, >3 hours/day. PT to progress mobility as tolerated, and will continue to follow acutely.          If plan is discharge home, recommend the following: Two people to help with walking and/or transfers;Two people to help with bathing/dressing/bathroom   Can travel by private vehicle        Equipment Recommendations BSC/3in1;Wheelchair cushion (measurements PT);Wheelchair (measurements PT)  Recommendations for Other Services       Functional Status Assessment Patient has had a recent decline in their functional status and demonstrates the ability to make significant improvements in function in a reasonable and predictable amount of time.     Precautions / Restrictions Precautions Precautions: Fall Recall of Precautions/Restrictions: Impaired Precaution/Restrictions Comments: ostomy, vent trach at time of eval 40% 5 PEEP, midline abd incision, ostomy, cortrak Restrictions Weight Bearing Restrictions Per Provider Order: No       Mobility  Bed Mobility Overal bed mobility: Needs Assistance             General bed mobility comments: chair position. pull to sit x1 with RUE assisting, max assist    Transfers                        Ambulation/Gait                  Stairs            Wheelchair Mobility     Tilt Bed    Modified Rankin (Stroke Patients Only)       Balance Overall balance assessment: Needs assistance Sitting-balance support: Feet supported Sitting balance-Leahy Scale: Poor Sitting balance - Comments: up in chair position, pt with R lateral bias after a few minutes Postural control: Right lateral lean                                   Pertinent Vitals/Pain Pain Assessment Pain Assessment: Faces Faces Pain Scale: Hurts little more Pain Location: abdomen Pain Descriptors / Indicators: Guarding, Moaning Pain Intervention(s): Limited activity within patient's tolerance, Monitored during session, Repositioned    Home Living Family/patient expects to be discharged to:: Private residence Living Arrangements: Children;Other (Comment) Available Help at Discharge: Family;Available 24 hours/day Type of Home: House Home Access: Stairs to enter   [employer] of Steps: 1   Home Layout: One level Home Equipment: Agricultural consultant (2 wheels);Shower seat      Prior Function Prior Level of Function : Independent/Modified Independent  Mobility Comments: using RW for gait ADLs Comments: daughter-in-law does IADL and driving     Extremity/Trunk Assessment   Upper Extremity Assessment Upper Extremity Assessment: Generalized weakness    Lower Extremity Assessment Lower Extremity Assessment: Generalized weakness (L foot drop, worse vs R. at least 2/5 hip flexion, knee flex/ext, not tested against gravity)    Cervical / Trunk Assessment Cervical / Trunk Assessment: Kyphotic  Communication    Communication Communication: No apparent difficulties    Cognition Arousal: Alert Behavior During Therapy: Anxious   PT - Cognitive impairments: Difficult to assess Difficult to assess due to: Impaired communication, Tracheostomy                     PT - Cognition Comments: pt nodding yes/no mostly consistently, not able to verbalize given trach Following commands: Intact       Cueing Cueing Techniques: Verbal cues, Gestural cues, Tactile cues     General Comments General comments (skin integrity, edema, etc.): vss on trach vent 40% 5PEEP    Exercises General Exercises - Lower Extremity Ankle Circles/Pumps: AAROM, Both, Supine Short Arc Quad: AROM, Both, 5 reps, Supine Heel Slides: Both, 5 reps, AAROM, Supine Shoulder Exercises Shoulder Flexion: AAROM, Both, 5 reps, Supine   Assessment/Plan    PT Assessment Patient needs continued PT services  PT Problem List Decreased strength;Decreased coordination;Decreased activity tolerance;Decreased balance;Decreased mobility;Decreased safety awareness;Decreased knowledge of use of DME;Decreased range of motion;Cardiopulmonary status limiting activity       PT Treatment Interventions Gait training;DME instruction;Balance training;Neuromuscular re-education;Functional mobility training;Patient/family education;Therapeutic activities;Therapeutic exercise;Cognitive remediation    PT Goals (Current goals can be found in the Care Plan section)  Acute Rehab PT Goals Patient Stated Goal: return home PT Goal Formulation: With patient Time For Goal Achievement: 12/02/23 Potential to Achieve Goals: Fair    Frequency Min 3X/week     Co-evaluation               AM-PAC PT "6 Clicks" Mobility  Outcome Measure Help needed turning from your back to your side while in a flat bed without using bedrails?: A Lot Help needed moving from lying on your back to sitting on the side of a flat bed without using bedrails?: Total Help  needed moving to and from a bed to a chair (including a wheelchair)?: Total Help needed standing up from a chair using your arms (e.g., wheelchair or bedside chair)?: Total Help needed to walk in hospital room?: Total Help needed climbing 3-5 steps with a railing? : Total 6 Click Score: 7    End of Session   Activity Tolerance: Patient limited by fatigue Patient left: in chair;with call bell/phone within reach;with nursing/sitter in room Nurse Communication: Mobility status PT Visit Diagnosis: Other abnormalities of gait and mobility (R26.89);Difficulty in walking, not elsewhere classified (R26.2);Muscle weakness (generalized) (M62.81)    Time: 1914-7829 PT Time Calculation (min) (ACUTE ONLY): 25 min   Charges:   PT Evaluation $PT Eval Low Complexity: 1 Low PT Treatments $Therapeutic Activity: 8-22 mins PT General Charges $$ ACUTE PT VISIT: 1 Visit         Marye Round, PT DPT Acute Rehabilitation Services Secure Chat Preferred  Office [phone]   Jenny White 11/30/2023, 4:11 PM

## 2023-11-30 NOTE — Progress Notes (Signed)
 Orthopedic Tech Progress Note Patient Details:  Jenny White [birth year] [id number]  Ortho Devices Type of Ortho Device: Prafo boot/shoe Ortho Device/Splint Location: BLE Ortho Device/Splint Interventions: Ordered, Application, Adjustment   Post Interventions Patient Tolerated: Well Instructions Provided: Care of device  [name] 11/30/2023, 2:17 PM

## 2023-11-30 NOTE — Progress Notes (Signed)
 NAME:  Jenny White, MRN:  [id number], DOB:  10/24/1945, LOS: 19 ADMISSION DATE:  11/11/2023 CONSULTATION DATE:  11/11/2023 REFERRING MD:  Dwain Sarna - CCS CHIEF COMPLAINT: Shock  History of Present Illness:  Jenny White 78 year old woman with a pmhx significant for  who presented to the emergency room with complaints of abdominal pain with N/V for the past week. She states the abdominal  pain got worsened after dinner. She reported experiencing nausea and  vomiting this morning.   In the ED  her labs were significant for a lactate 4.1 After fluid resuscitation, Lactate decreased to 2.5. CT scan showed high grade small bowel obstruction with transition point in the right lower quadrant along the distal ileum. Some trace of ascites and mesenteric stranding.Concern for bowel wall ischemia. Broad spectrum antibiotics ( zosyn) was started, blood cultures pending.   Patient was taken to the ER emergently with CCS 3/4 for Ex-lap / LOA. Intraoperative course  was notable for free fluid on entry, with closed loop bowel obstruction noted secondary to tight band adhesion. Patient left in discontinuity/ anastomosis not completed with open abdomen and plan for second look in 24-48 hrs. Patient remained intubated and was transferred to ICU in critical condition.   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 Elevated LA found to have SBO taken emergently to surgery for EX-LAP.  3/7 extubated reintubated 3/10 extubated again 3/11 OFF NIPPV 3/12 got ativan at HS. Sleepy and difficult to arouse all day.  3/13 transferred to ICU for obtundation. Re-intubated.  3/16 Extubated  3/17 zyprexa x 1 overnight,  Obtunded , failed BiPAP,  reintubated 3/17 several conversations with unhappy family members, offered transfer to different facility, called [facility]>> no beds 3/18 resumed BuSpar, gabapentin, stopped oxycodone & fent gtt since oversedated 3/19 Amiodarone IV extravasated LUE 3/20 dex, low dose NE, diuresing  3/21 tracheostomy placed at bedside  Interim History / Subjective:   On trach collar this morning, tolerated for 2 hours yesterday Daughter at bedside  Objective:  Blood pressure 139/83, pulse 90, temperature 98.3 F (36.8 C), temperature source Oral, resp. rate (!) 21, height 5' 7.01" (1.702 m), weight 52.1 kg, SpO2 100%.    Vent Mode: PRVC FiO2 (%):  [40 %] 40 % Set Rate:  [26 bmp] 26 bmp Vt Set:  [420 mL] 420 mL PEEP:  [5 cmH20] 5 cmH20 Plateau Pressure:  [19 cmH20-20 cmH20] 20 cmH20   Intake/Output Summary (Last 24 hours) at 11/30/2023 0734 Last data filed at 11/30/2023 0600 Gross per 24 hour  Intake 1877.74 ml  Output 2450 ml  Net -572.26 ml   Filed Weights   11/28/23 0702 11/29/23 0500 11/30/23 0356  Weight: 53.4 kg 52.8 kg 52.1 kg   Physical Examination: General:  Frail appearing elderly woman, no acute distress HEENT: MM pink/dry, trach in place - trach collar cortrak Neuro: Awake, follows simple commands in all extremities, generalized weakness CV: rr, no murmur PULM:  tachypnic, no wheezing GI: soft non tender, bs+, stapled incision, healing, ostomy with liquid output Extremities: warm/dry, dependent UE edema, no tibial edema, dressing to L forearm Skin: no rashes    Assessment & Plan:   Acute on chronic resp failure with hypoxia, hypercarbia ETT 3/4 >>3/7 >>  3/10, 3/13- 3/16, 3/17 >>3/21 Severe COPD -FEV1 30% RLL Cancer s/p SBRT S/p tracheostomy 3/21 - MV support, trach collar trials as tolerated - trach care - VAP/ PPI - avoid ativan/ zyprexa - Budesonide/Brovana/Yupelri, prn xopenex - ongoing pulm hygiene, PT/ OT - stop precedex, PRN fentanyl  HAP:  Aeromonas and  Hafnia Alvei pneumonia > both S to meropenmem - remains on low dose NE, goal MAP > 65, suspect 2/2 due to ongoing sedation needs - cont meropenem for total of 10-14 days of therapy  Hypotension - continue midodrine  New onset paroxymal atrial fibrillation - echo 3/6 EF 60-65%, G1DD, mildly reduced RVSF, imaging difficult - transition to eliquis today - cont amiodarone per tube - optimize electrolytes  High grade small bowel obstruction s/p ex lap 3/4 with resection and end ileostomy Severe protein calorie malnutrition - Surgery following for postop care - fiber for optimal goal stool output 800-1L - EN per RD, cortrak  Acute metabolic encephalopathy -likely related to hypercarbia Concern for opiate withdrawal Severe anxiety at baseline - stop precedex, prn fentanyl  Following commands - cont gabapentin and BuSpar (increased to 10mg  BID 3/23)  Diabetes type 2  - CBG q4, SSI prn  Amiodarone extravasation in left arm -s/p hyaluronidase injection - Limb elevation/ wound care  Deconditioning - PT/ OT   Best Practice: (right click and "Reselect all SmartList Selections" daily)   Diet/type: tubefeeds DVT prophylaxis: DOAC GI prophylaxis: PPI Lines: PICC Foley:  N/A Code Status:  full code Last date of multidisciplinary goals of care discussion: ongoing, planning for LTACH  Pending 3/21, no family at bedside am.   CRITICAL CARE Performed by: Martina Sinner   Total critical care time:  35 minutes  Critical care time was exclusive of separately billable procedures and treating other patients.  Critical care was necessary to treat or prevent imminent or life-threatening deterioration.  Critical care was time spent personally by me on the following activities: development of treatment plan with patient and/or surrogate as well as nursing, discussions with consultants, evaluation of patient's response to treatment, examination of patient, obtaining history from patient  or surrogate, ordering and performing treatments and interventions, ordering and review of laboratory studies, ordering and review of radiographic studies, pulse oximetry, re-evaluation of patient's condition and participation in multidisciplinary rounds.      Melody Comas, MD [facility] Office: [phone]   See Amion for personal pager PCCM on call pager [phone] until 7pm. Please call Elink 7p-7a. [phone]

## 2023-11-30 NOTE — Progress Notes (Signed)
 [facility] ADULT ICU REPLACEMENT PROTOCOL   The patient does apply for the [facility] Adult ICU Electrolyte Replacment Protocol based on the criteria listed below:   1.Exclusion criteria: TCTS, ECMO, Dialysis, and Myasthenia Gravis patients 2. Is GFR >/= 30 ml/min? Yes.    Patient's GFR today is >60 3. Is SCr </= 2? Yes.   Patient's SCr is 0.55 mg/dL 4. Did SCr increase >/= 0.5 in 24 hours? No. 5.Pt's weight >40kg  Yes.   6. Abnormal electrolyte(s): Phos, K  7. Electrolytes replaced per protocol 8.  Call MD STAT for K+ </= 2.5, Phos </= 1, or Mag </= 1 Physician:  Thersa Salt Renesmee Raine 11/30/2023 5:33 AM

## 2023-12-01 DIAGNOSIS — J189 Pneumonia, unspecified organism: Secondary | ICD-10-CM | POA: Diagnosis not present

## 2023-12-01 DIAGNOSIS — E870 Hyperosmolality and hypernatremia: Secondary | ICD-10-CM | POA: Diagnosis not present

## 2023-12-01 DIAGNOSIS — J9621 Acute and chronic respiratory failure with hypoxia: Secondary | ICD-10-CM | POA: Diagnosis not present

## 2023-12-01 DIAGNOSIS — J9622 Acute and chronic respiratory failure with hypercapnia: Secondary | ICD-10-CM | POA: Diagnosis not present

## 2023-12-01 LAB — GLUCOSE, CAPILLARY
Glucose-Capillary: 81 mg/dL (ref 70–99)
Glucose-Capillary: 91 mg/dL (ref 70–99)
Glucose-Capillary: 91 mg/dL (ref 70–99)
Glucose-Capillary: 95 mg/dL (ref 70–99)

## 2023-12-01 LAB — RENAL FUNCTION PANEL
Albumin: 2.5 g/dL — ABNORMAL LOW (ref 3.5–5.0)
Anion gap: 5 (ref 5–15)
BUN: 31 mg/dL — ABNORMAL HIGH (ref 8–23)
CO2: 34 mmol/L — ABNORMAL HIGH (ref 22–32)
Calcium: 9.7 mg/dL (ref 8.9–10.3)
Chloride: 116 mmol/L — ABNORMAL HIGH (ref 98–111)
Creatinine, Ser: 0.51 mg/dL (ref 0.44–1.00)
GFR, Estimated: >60 mL/min (ref >60)
Glucose, Bld: 113 mg/dL — ABNORMAL HIGH (ref 70–99)
Phosphorus: 2.2 mg/dL — ABNORMAL LOW (ref 2.5–4.6)
Potassium: 4 mmol/L (ref 3.5–5.1)
Sodium: 155 mmol/L — ABNORMAL HIGH (ref 135–145)

## 2023-12-01 MED ORDER — K PHOS MONO-SOD PHOS DI & MONO 155-852-130 MG PO TABS
500.0000 mg | ORAL_TABLET | ORAL | Status: AC
  Administered 2023-12-01 (×3): 500 mg
  Filled 2023-12-01 (×3): qty 2

## 2023-12-01 MED ORDER — MIDODRINE HCL 2.5 MG PO TABS
2.5000 mg | ORAL_TABLET | Freq: Three times a day (TID) | ORAL | Status: DC
  Administered 2023-12-01 – 2023-12-02 (×3): 2.5 mg
  Filled 2023-12-01 (×3): qty 1

## 2023-12-01 MED ORDER — PANTOPRAZOLE SODIUM 40 MG IV SOLR
40.0000 mg | INTRAVENOUS | Status: DC
  Administered 2023-12-01 – 2023-12-11 (×11): 40 mg via INTRAVENOUS
  Filled 2023-12-01 (×11): qty 10

## 2023-12-01 MED ORDER — FENTANYL CITRATE PF 50 MCG/ML IJ SOSY
100.0000 ug | PREFILLED_SYRINGE | Freq: Once | INTRAMUSCULAR | Status: AC
  Administered 2023-12-01: 100 ug via INTRAVENOUS

## 2023-12-01 MED ORDER — LOPERAMIDE HCL 1 MG/7.5ML PO SUSP
4.0000 mg | Freq: Four times a day (QID) | ORAL | Status: DC
  Administered 2023-12-01 (×4): 4 mg
  Filled 2023-12-01 (×4): qty 30

## 2023-12-01 MED ORDER — FREE WATER
300.0000 mL | Status: DC
  Administered 2023-12-01 – 2023-12-02 (×6): 300 mL

## 2023-12-01 NOTE — Progress Notes (Signed)
 SLP Cancellation Note  Patient Details Name: Jenny White MRN: [id number] DOB: 11-04-1945   Cancelled treatment:       Reason Eval/Treat Not Completed: Patient not medically ready. Pt on ventilator. Will f/u   Daizha Anand, Riley Nearing 12/01/2023, 11:19 AM

## 2023-12-01 NOTE — TOC Progression Note (Signed)
 Transition of Care [facility]) - Progression Note    Patient Details  Name: Jenny White MRN: [id number] Date of Birth: 09-May-1946  Transition of Care [facility]) CM/SW Contact  Mearl Latin, LCSW Phone Number: 12/01/2023, 10:09 AM  Clinical Narrative:    TOC continuing to follow.   Expected Discharge Plan: Home w Home Health Services Barriers to Discharge: Continued Medical Work up  Expected Discharge Plan and Services   Discharge Planning Services: CM Consult Post Acute Care Choice: Home Health Living arrangements for the past 2 months: Single Family Home                                       Social Determinants of Health (SDOH) Interventions SDOH Screenings   Food Insecurity: No Food Insecurity (11/11/2023)  Housing: Low Risk  (11/11/2023)  Transportation Needs: No Transportation Needs (11/11/2023)  Utilities: Not At Risk (11/11/2023)  Depression (PHQ2-9): Low Risk  (06/17/2023)  Financial Resource Strain: Low Risk  (11/06/2023)   Received from [facility]  Social Connections: Unknown (11/11/2023)  Tobacco Use: Medium Risk (11/11/2023)    Readmission Risk Interventions     No data to display

## 2023-12-01 NOTE — Progress Notes (Addendum)
 Patient ID: Jenny White, female   DOB: 1946-05-17, 78 y.o.   MRN: [id number] 18 Days Post-Op    Subjective: On vent  Son at bedside Pt denies pain. Asks if rectal pressure is normal. Hoping to go to CIR  ROS negative except as listed above. Objective: Vital signs in last 24 hours: Temp:  [97.5 F (36.4 C)-98.9 F (37.2 C)] 97.8 F (36.6 C) (03/24 0738) Pulse Rate:  [77-105] 98 (03/24 0801) Resp:  [20-33] 33 (03/24 0801) BP: (102-161)/(42-100) 125/64 (03/24 0700) SpO2:  [94 %-100 %] 98 % (03/24 0801) FiO2 (%):  [40 %] 40 % (03/24 0801) Weight:  [46.9 kg] 46.9 kg (03/24 0418) Last BM Date : 11/30/23  Intake/Output from previous day: 03/23 0701 - 03/24 0700 In: 2520 [NG/GT:2320; IV Piggyback:200] Out: 4200 [Urine:1750; Stool:2450] Intake/Output this shift: No intake/output data recorded.  GI: soft, NT today, incision OK, a lot of liquid stool in bag  Ileostomy: 2,450/24h Lab Results: CBC  Recent Labs    11/29/23 0438 11/30/23 0410  WBC 10.5 8.8  HGB 7.7* 9.4*  HCT 25.0* 31.5*  PLT 390 310   BMET Recent Labs    11/30/23 0410 12/01/23 0333  NA 155* 155*  K 3.6 4.0  CL 119* 116*  CO2 31 34*  GLUCOSE 122* 113*  BUN 35* 31*  CREATININE 0.55 0.51  CALCIUM 10.0 9.7   PT/INR No results for input(s): "LABPROT", "INR" in the last 72 hours. ABG No results for input(s): "PHART", "HCO3" in the last 72 hours.  Invalid input(s): "PCO2", "PO2"  Studies/Results: DG Abd 1 View Result Date: 11/29/2023 CLINICAL DATA:  Abdominal pain. Status post exploratory laparotomy with bowel resection and ileostomy. EXAM: ABDOMEN - 1 VIEW COMPARISON:  One-view abdomen 11/27/2023. FINDINGS: A small bore feeding tube is stable in position over the distal duodenum. Bowel gas pattern is within normal limits. Contrast in the proximal ascending colon is stable. Postoperative changes of the lumbar spine are noted. IMPRESSION: 1. Stable position of small bore feeding tube over the distal duodenum.  2. No acute abnormality. Electronically Signed   By: Marin Roberts M.D.   On: 11/29/2023 12:48    Anti-infectives: Anti-infectives (From admission, onward)    Start     Dose/Rate Route Frequency Ordered Stop   11/25/23 1145  meropenem (MERREM) 1 g in sodium chloride 0.9 % 100 mL IVPB        1 g 200 mL/hr over 30 Minutes Intravenous Every 12 hours 11/25/23 1045 12/02/23 0959   11/20/23 1045  tobramycin (PF) (TOBI) nebulizer solution 300 mg  Status:  Discontinued        300 mg Nebulization 2 times daily 11/20/23 0952 11/23/23 1133   11/17/23 1000  meropenem (MERREM) 1 g in sodium chloride 0.9 % 100 mL IVPB  Status:  Discontinued        1 g 200 mL/hr over 30 Minutes Intravenous Every 12 hours 11/17/23 0832 11/23/23 1133   11/15/23 1400  piperacillin-tazobactam (ZOSYN) IVPB 3.375 g  Status:  Discontinued        3.375 g 100 mL/hr over 30 Minutes Intravenous Every 8 hours 11/15/23 1223 11/17/23 0832   11/11/23 2000  piperacillin-tazobactam (ZOSYN) IVPB 3.375 g  Status:  Discontinued        3.375 g 12.5 mL/hr over 240 Minutes Intravenous Every 8 hours 11/11/23 1233 11/15/23 1223   11/11/23 1245  piperacillin-tazobactam (ZOSYN) IVPB 3.375 g        3.375 g 100 mL/hr over  30 Minutes Intravenous  Once 11/11/23 1233 11/11/23 1316       Assessment/Plan: POD 20/18 S/P ex-lap small bowel resection, left open; ex-lap, small bowel resection, ileostomy and closure, Dr. Dwain Sarna for SBO - ongoing high ileostomy output. Continue banatrol. Increase imodium from 4mg  BID to 4 mg QID.  - HTC trials per CCM - VTE: on lovenox 60 mg BID - Remainder of care per CCM. Surgery will continue to follow.   LOS: 20 days   Hosie Spangle, [facility] Surgery Please see Amion for pager number during day hours 7:00am-4:30pm       12/01/2023

## 2023-12-01 NOTE — Progress Notes (Signed)
 NAME:  Jenny White, MRN:  [id number], DOB:  November 19, 1945, LOS: 20 ADMISSION DATE:  11/11/2023 CONSULTATION DATE:  11/11/2023 REFERRING MD:  Dwain Sarna - CCS CHIEF COMPLAINT: Shock  History of Present Illness:  Jenny White 78 year old woman with a pmhx significant for  who presented to the emergency room with complaints of abdominal pain with N/V for the past week. She states the abdominal  pain got worsened after dinner. She reported experiencing nausea and  vomiting this morning.   In the ED  her labs were significant for a lactate 4.1 After fluid resuscitation, Lactate decreased to 2.5. CT scan showed high grade small bowel obstruction with transition point in the right lower quadrant along the distal ileum. Some trace of ascites and mesenteric stranding.Concern for bowel wall ischemia. Broad spectrum antibiotics ( zosyn) was started, blood cultures pending.   Patient was taken to the ER emergently with CCS 3/4 for Ex-lap / LOA. Intraoperative course  was notable for free fluid on entry, with closed loop bowel obstruction noted secondary to tight band adhesion. Patient left in discontinuity/ anastomosis not completed with open abdomen and plan for second look in 24-48 hrs. Patient remained intubated and was transferred to ICU in critical condition.   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 Elevated LA found to have SBO taken emergently to surgery for EX-LAP.  3/7 extubated reintubated 3/10 extubated again 3/11 OFF NIPPV 3/12 got ativan at HS. Sleepy and difficult to arouse all day.  3/13 transferred to ICU for obtundation. Re-intubated.  3/16 Extubated  3/17 zyprexa x 1 overnight,  Obtunded , failed BiPAP,  reintubated 3/17 several conversations with unhappy family members, offered transfer to different facility, called [facility]>> no beds 3/18 resumed BuSpar, gabapentin, stopped oxycodone & fent gtt since oversedated 3/19 Amiodarone IV extravasated LUE 3/20 dex, low dose NE, diuresing  3/21 tracheostomy placed at bedside  Interim History / Subjective:   Increased ostomy output. Surgery increased loperamide  On minimal vent settings. Failed yesterday due to anxiety  Objective:  Blood pressure 125/64, pulse 98, temperature 97.8 F (36.6 C), temperature source Oral, resp. rate (!) 33, height 5' 7.01" (1.702 m), weight 46.9 kg, SpO2 98%.    Vent Mode: PRVC FiO2 (%):  [40 %] 40 % Set Rate:  [26 bmp] 26 bmp Vt Set:  [420 mL] 420 mL PEEP:  [5 cmH20] 5 cmH20 Plateau Pressure:  [17 cmH20-21 cmH20] 21 cmH20   Intake/Output Summary (Last 24 hours) at 12/01/2023 0844 Last data filed at 12/01/2023 0600 Gross per 24 hour  Intake 2420 ml  Output 4200 ml  Net -1780 ml   Filed Weights   11/29/23 0500 11/30/23 0356 12/01/23 0418  Weight: 52.8 kg 52.1 kg 46.9 kg   Physical Exam: General:Elderly-appearing, no acute distress HENT: [place], AT, OP clear, MMM Neck: Trach in place, c/d/I Eyes: EOMI, no scleral icterus Respiratory: Clear to auscultation bilaterally.  No crackles, wheezing or rales Cardiovascular: RRR, -M/R/G, no JVD GI: BS+, soft, nontender Extremities:-Edema,-tenderness Neuro: Awake and alert, CNII-XII grossly intact, follows commands, tremors GU: Foley in place  Assessment & Plan:   Acute on chronic resp failure with hypoxia, hypercarbia ETT 3/4 >>3/7 >> 3/10, 3/13- 3/16, 3/17 >>3/21 Severe COPD -FEV1 30%  RLL Cancer s/p SBRT S/p tracheostomy 3/21 - MV support - TCT as tolerated - Routine trach care - VAP/ PPI - avoid ativan/ zyprexa - Budesonide/Brovana/Yupelri, prn xopenex - ongoing pulm hygiene, PT/ OT - stop precedex, PRN fentanyl  HAP:  Aeromonas and Hafnia Alvei  pneumonia > both S to meropenmem - remains on low dose NE, goal MAP > 65, suspect 2/2 due to ongoing sedation needs - Complete meropenem for total of 14 days of therapy today  Hypotension - improved - wean midodrine  New onset paroxymal atrial fibrillation - currently in NSR. May be secondary to acute illness - echo 3/6 EF 60-65%, G1DD, mildly reduced RVSF, imaging difficult - Eliquis - Trial off amio. Monitor on tele - optimize electrolytes  High grade small bowel obstruction s/p ex lap 3/4 with resection and end ileostomy Severe protein calorie malnutrition - Surgery following for postop care - fiber for optimal goal stool output 800-1L - EN per RD, cortrak  Acute metabolic encephalopathy -likely related to hypercarbia Concern for opiate withdrawal Severe anxiety at baseline - stop precedex, prn fentanyl  Following commands - cont gabapentin and BuSpar (increased to 10mg  BID 3/23)  Hypernatremia - Increase FWF  Diabetes type 2  - CBG q4, SSI prn  Amiodarone extravasation in left arm -s/p hyaluronidase injection - Limb elevation/ wound care  Deconditioning - PT/ OT   Best Practice: (right click and "Reselect all SmartList Selections" daily)   Diet/type: tubefeeds DVT prophylaxis: DOAC GI prophylaxis: H2B Lines: PICC Foley:  N/A Code Status:  full code Last date of multidisciplinary goals of care discussion: ongoing, planning for LTACH  The patient is critically ill with multiple organ systems failure and requires high complexity decision making for assessment and support, frequent evaluation and titration of therapies, application of advanced monitoring technologies and extensive interpretation of multiple databases.  Independent Critical Care Time: 36 Minutes.   Jenny White, M.D. [facility] Pulmonary/Critical Care Medicine 12/01/2023 8:44 AM   Please see Amion for pager number to reach on-call Pulmonary and Critical Care Team.

## 2023-12-01 NOTE — Progress Notes (Signed)
 eLink Physician-Brief Progress Note Patient Name: Jenny White DOB: 05-16-1946 MRN: [id number]   Date of Service  12/01/2023  HPI/Events of Note  request for something to help her relax and sleep. Restless. SHe has had 100 fent and melatonin.  Per primary team, avoid antipsychotics and benzos.  eICU Interventions  Additional fentanyl dose.       Intervention Category Minor Interventions: Agitation / anxiety - evaluation and management  Sheily Lineman 12/01/2023, 1:08 AM

## 2023-12-01 NOTE — Consult Note (Signed)
 WOC Nurse ostomy follow up Stoma type/location: Ileostomy RLQ Stomal assessment/size: 25 x 25 mm, round, red and viable. Slightly above the skin.  Peristomal assessment: Not assess, pouch intact. Treatment options for stomal/peristomal skin:  Output 60 ml today at 0830, brown, light brown, soft liquid. Ostomy pouching: 1pc. #[id number]  Education provided: Talked to patient, she in a ventilator, but alert and responsive. The WOC team will provide further instructions on the next visit. Enrolled patient in [place] Secure Start Discharge program: No   WOC team will follow weekly.   Please reconsult if further assistance is needed. Thank-you,  Denyse Amass BSN, RN, [employer], WOC  (Pager: [phone])

## 2023-12-01 NOTE — Plan of Care (Signed)
   Problem: Health Behavior/Discharge Planning: Goal: Ability to manage health-related needs will improve Outcome: Progressing   Problem: Clinical Measurements: Goal: Ability to maintain clinical measurements within normal limits will improve Outcome: Progressing Goal: Will remain free from infection Outcome: Progressing Goal: Diagnostic test results will improve Outcome: Progressing

## 2023-12-01 NOTE — Consult Note (Signed)
 WOC Nurse Consult Note: Reason for Consult: sacral wound  Wound type: Deep Tissue Pressure Injury sacrum/buttocks  Pressure Injury POA: no  Measurement: see nursing flowsheet  Wound bed:  evolving DTPI purple maroon discoloration with partial thickness skin loss at lateral edge  Drainage (amount, consistency, odor) appears serosanguinous  Periwound: erythema  Dressing procedure/placement/frequency: Cleanse sacrum/buttocks/coccyx with Vashe wound cleanser Hart [place] [phone]), do not rinse and allow to air dry. Apply Xeroform gauze Hart [place] [phone]) to area of purple maroon discoloration daily.  Cover with silicone foam or ABD pad whichever is preferred.    POC discussed with bedside nurse. WOC team will follow every 7-10 days to assess and change POC as warranted   Thank you,    Priscella Mann MSN, RN-BC, [employer] [phone]

## 2023-12-01 NOTE — Evaluation (Signed)
 Occupational Therapy Evaluation Patient Details Name: Jenny White MRN: [id number] DOB: 10/06/1945 Today's Date: 12/01/2023   History of Present Illness   78 yo female presenting with n/v on 3/4. CT(+) SBO. 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 Trach placed 3/21. PMH COPD, GERD, nephrolithiasis, HTN, OSA, lung CA (05/2023) s/p radiation, arthritis, spinal stenosis L3-4 fusion.     Clinical Impressions Pt evaluated s/p the admission list above. At baseline, pt lives at home with son and completes ADLs and functional mobility with MOD I using RW. Pt daughter-in-law assist with IADLs. Upon evaluation, pt was limited by generalized weakness, abdominal pain, decreased activity tolerance. Pt required MAX A+2 for bed mobility tasks with increased time and verbal/tactile cues for sequencing. Pt tolerated sitting EOB ~5 minutes, initially requiring MIN A to maintain balance but quickly became fatigued requiring MAX A overall and verbal encouragement to remain seated upright at EOB. Pt completed grooming task using oral swab with setup assistance.OT to continue following pt acutely to address functional needs with discharge recommendations of follow-up inpatient therapy services >3hrs/day.      If plan is discharge home, recommend the following:   Two people to help with walking and/or transfers;A lot of help with bathing/dressing/bathroom;Assistance with cooking/housework;Assistance with feeding;Direct supervision/assist for medications management;Direct supervision/assist for financial management;Assist for transportation;Help with stairs or ramp for entrance;Supervision due to cognitive status     Functional Status Assessment   Patient has had a recent decline in their functional status and demonstrates the ability to make significant improvements in function in a reasonable and predictable amount of time.     Equipment  Recommendations   Other (comment) (defer)     Recommendations for Other Services   Rehab consult     Precautions/Restrictions   Precautions Precautions: Fall Recall of Precautions/Restrictions: Impaired Precaution/Restrictions Comments: ostomy, trach with 40% FiO2 at 10L, midline abd incision, ostomy, cortrak Restrictions Weight Bearing Restrictions Per Provider Order: No     Mobility Bed Mobility Overal bed mobility: Needs Assistance Bed Mobility: Supine to Sit, Sit to Supine     Supine to sit: HOB elevated, Used rails, Max assist, +2 for physical assistance Sit to supine: Max assist, +2 for physical assistance, HOB elevated, Used rails   General bed mobility comments: Verbal/tactile cues provided for sequencing. Pt required assistance bring LLE OOB, elevating trunk to upright position and scooting forward until B feet were flat on the floor    Transfers Overall transfer level: Needs assistance                 General transfer comment: DNT for safety reasons; Pt with increased fatigue and decreased sitting balance      Balance Overall balance assessment: Needs assistance Sitting-balance support: Feet supported Sitting balance-Leahy Scale: Poor Sitting balance - Comments: Initially pt required MIN A to maintain balance but quickly fatigued and required MAX A to sit upright at EOB Postural control: Posterior lean                                 ADL either performed or assessed with clinical judgement   ADL Overall ADL's : Needs assistance/impaired Eating/Feeding: NPO   Grooming: Oral care;Set up;Sitting   Upper Body Bathing: Minimal assistance;Sitting   Lower Body Bathing: Maximal assistance;Sitting/lateral leans   Upper Body Dressing : Moderate assistance;Sitting   Lower Body Dressing: Maximal assistance;Sitting/lateral leans  Toilet Transfer: Total assistance;+2 for safety/equipment   Toileting- Clothing Manipulation and Hygiene:  Total assistance;Sitting/lateral lean       Functional mobility during ADLs: Maximal assistance;+2 for physical assistance;Cueing for sequencing General ADL Comments: Pt required increased time and verbal/tactile cues for sequencing. Pt able to point to indicate wanting oral swab. Once provided, pt able to complete task. Pt with decreased activity tolerance and generalized weakness     Vision Baseline Vision/History: 0 No visual deficits Ability to See in Adequate Light: 0 Adequate Patient Visual Report: No change from baseline Vision Assessment?: No apparent visual deficits     Perception Perception: Not tested       Praxis Praxis: Not tested       Pertinent Vitals/Pain Pain Assessment Pain Assessment: Faces Faces Pain Scale: Hurts little more Pain Location: abdomen Pain Descriptors / Indicators: Discomfort, Aching Pain Intervention(s): Monitored during session, Limited activity within patient's tolerance     Extremity/Trunk Assessment Upper Extremity Assessment Upper Extremity Assessment: Generalized weakness   Lower Extremity Assessment Lower Extremity Assessment: Defer to PT evaluation   Cervical / Trunk Assessment Cervical / Trunk Assessment: Kyphotic   Communication Communication Communication: Impaired Factors Affecting Communication: Trach/intubated   Cognition Arousal: Alert Behavior During Therapy: WFL for tasks assessed/performed Cognition: Difficult to assess Difficult to assess due to: Tracheostomy           OT - Cognition Comments: Pt used head gestures to answer "yes" "no" questions. Pt was able to mouth or point to wants/needs. Pt able to express pain in abdomen                 Following commands: Impaired Following commands impaired: Follows one step commands with increased time     Cueing  General Comments   Cueing Techniques: Verbal cues;Gestural cues;Tactile cues  VSS on trach 40% fiO2 10 L   Exercises     Shoulder  Instructions      Home Living Family/patient expects to be discharged to:: Private residence Living Arrangements: Children;Other (Comment) (son) Available Help at Discharge: Family;Available 24 hours/day Type of Home: House Home Access: Stairs to enter [employer] of Steps: 1   Home Layout: One level     Bathroom Shower/Tub: Chief Strategy Officer: Handicapped height     Home Equipment: Agricultural consultant (2 wheels);Shower seat          Prior Functioning/Environment Prior Level of Function : Independent/Modified Independent             Mobility Comments: using RW for gait ADLs Comments: daughter-in-law does IADL and driving    OT Problem List: Decreased strength;Decreased range of motion;Decreased activity tolerance;Impaired balance (sitting and/or standing);Decreased coordination;Decreased safety awareness;Decreased knowledge of use of DME or AE;Decreased cognition   OT Treatment/Interventions: Self-care/ADL training;Therapeutic exercise;Energy conservation;DME and/or AE instruction;Therapeutic activities;Patient/family education;Balance training      OT Goals(Current goals can be found in the care plan section)   Acute Rehab OT Goals Patient Stated Goal: none stated OT Goal Formulation: Patient unable to participate in goal setting Time For Goal Achievement: 12/15/23 Potential to Achieve Goals: Good ADL Goals Pt Will Perform Grooming: with supervision;sitting Pt Will Perform Upper Body Dressing: with min assist;sitting Pt Will Perform Lower Body Dressing: with mod assist;sit to/from stand;sitting/lateral leans Pt Will Transfer to Toilet: with mod assist;stand pivot transfer;bedside commode Pt Will Perform Toileting - Clothing Manipulation and hygiene: with mod assist;sitting/lateral leans;sit to/from stand Additional ADL Goal #1: Pt will demonstrate improved activity tolerance by completing  functional task of choice for 10 minutes while seated  EOB without visible signs of fatigue Additional ADL Goal #2: Pt will complete bed mobility tasks with MOD A as a precursor to OOB ADLs   OT Frequency:  Min 2X/week    Co-evaluation              AM-PAC OT "6 Clicks" Daily Activity     Outcome Measure Help from another person eating meals?: Total Help from another person taking care of personal grooming?: A Little Help from another person toileting, which includes using toliet, bedpan, or urinal?: Total Help from another person bathing (including washing, rinsing, drying)?: A Lot Help from another person to put on and taking off regular upper body clothing?: A Lot Help from another person to put on and taking off regular lower body clothing?: A Lot 6 Click Score: 11   End of Session Equipment Utilized During Treatment: Oxygen Nurse Communication: Mobility status  Activity Tolerance: Patient limited by fatigue Patient left: in bed;with call bell/phone within reach;with bed alarm set  OT Visit Diagnosis: Unsteadiness on feet (R26.81);Other abnormalities of gait and mobility (R26.89);Muscle weakness (generalized) (M62.81);History of falling (Z91.81)                Time: 1610-9604 OT Time Calculation (min): 27 min Charges:  OT General Charges $OT Visit: 1 Visit OT Evaluation $OT Eval Moderate Complexity: 1 Mod OT Treatments $Self Care/Home Management : 8-22 mins  [address] MOT[facility]  Reice Bienvenue 12/01/2023, 5:20 PM

## 2023-12-01 NOTE — Progress Notes (Signed)
 [facility] ADULT ICU REPLACEMENT PROTOCOL   The patient does apply for the [facility] Adult ICU Electrolyte Replacment Protocol based on the criteria listed below:   1.Exclusion criteria: TCTS, ECMO, Dialysis, and Myasthenia Gravis patients 2. Is GFR >/= 30 ml/min? Yes.    Patient's GFR today is >60 3. Is SCr </= 2? Yes.   Patient's SCr is 0.51 mg/dL 4. Did SCr increase >/= 0.5 in 24 hours? No. 5.Pt's weight >40kg  Yes.   6. Abnormal electrolyte(s): Phos  7. Electrolytes replaced per protocol 8.  Call MD STAT for K+ </= 2.5, Phos </= 1, or Mag </= 1 Physician:  Thersa Salt Magalene Mclear 12/01/2023 5:17 AM

## 2023-12-02 DIAGNOSIS — J9621 Acute and chronic respiratory failure with hypoxia: Secondary | ICD-10-CM | POA: Diagnosis not present

## 2023-12-02 DIAGNOSIS — J189 Pneumonia, unspecified organism: Secondary | ICD-10-CM | POA: Diagnosis not present

## 2023-12-02 DIAGNOSIS — J9622 Acute and chronic respiratory failure with hypercapnia: Secondary | ICD-10-CM | POA: Diagnosis not present

## 2023-12-02 DIAGNOSIS — E870 Hyperosmolality and hypernatremia: Secondary | ICD-10-CM | POA: Diagnosis not present

## 2023-12-02 LAB — PHOSPHORUS: Phosphorus: 2.4 mg/dL — ABNORMAL LOW (ref 2.5–4.6)

## 2023-12-02 LAB — MAGNESIUM: Magnesium: 1.9 mg/dL (ref 1.7–2.4)

## 2023-12-02 LAB — CBC
HCT: 27.7 % — ABNORMAL LOW (ref 36.0–46.0)
Hemoglobin: 8.2 g/dL — ABNORMAL LOW (ref 12.0–15.0)
MCH: 29.6 pg (ref 26.0–34.0)
MCHC: 29.6 g/dL — ABNORMAL LOW (ref 30.0–36.0)
MCV: 100 fL (ref 80.0–100.0)
Platelets: 429 10*3/uL — ABNORMAL HIGH (ref 150–400)
RBC: 2.77 MIL/uL — ABNORMAL LOW (ref 3.87–5.11)
RDW: 17.6 % — ABNORMAL HIGH (ref 11.5–15.5)
WBC: 11.4 10*3/uL — ABNORMAL HIGH (ref 4.0–10.5)
nRBC: 0.4 % — ABNORMAL HIGH (ref 0.0–0.2)

## 2023-12-02 LAB — BASIC METABOLIC PANEL
Anion gap: 5 (ref 5–15)
BUN: 35 mg/dL — ABNORMAL HIGH (ref 8–23)
CO2: 34 mmol/L — ABNORMAL HIGH (ref 22–32)
Calcium: 9.6 mg/dL (ref 8.9–10.3)
Chloride: 116 mmol/L — ABNORMAL HIGH (ref 98–111)
Creatinine, Ser: 0.62 mg/dL (ref 0.44–1.00)
GFR, Estimated: >60 mL/min (ref >60)
Glucose, Bld: 103 mg/dL — ABNORMAL HIGH (ref 70–99)
Potassium: 4 mmol/L (ref 3.5–5.1)
Sodium: 155 mmol/L — ABNORMAL HIGH (ref 135–145)

## 2023-12-02 LAB — GLUCOSE, CAPILLARY
Glucose-Capillary: 103 mg/dL — ABNORMAL HIGH (ref 70–99)
Glucose-Capillary: 104 mg/dL — ABNORMAL HIGH (ref 70–99)
Glucose-Capillary: 84 mg/dL (ref 70–99)
Glucose-Capillary: 89 mg/dL (ref 70–99)
Glucose-Capillary: 93 mg/dL (ref 70–99)
Glucose-Capillary: 94 mg/dL (ref 70–99)

## 2023-12-02 MED ORDER — DIPHENOXYLATE-ATROPINE 2.5-0.025 MG PO TABS
1.0000 | ORAL_TABLET | Freq: Four times a day (QID) | ORAL | Status: DC
  Administered 2023-12-02 (×4): 1 via ORAL
  Filled 2023-12-02 (×4): qty 1

## 2023-12-02 MED ORDER — K PHOS MONO-SOD PHOS DI & MONO 155-852-130 MG PO TABS
500.0000 mg | ORAL_TABLET | Freq: Three times a day (TID) | ORAL | Status: AC
  Administered 2023-12-02 (×3): 500 mg
  Filled 2023-12-02 (×3): qty 2

## 2023-12-02 MED ORDER — FREE WATER
300.0000 mL | Status: DC
  Administered 2023-12-02 – 2023-12-04 (×23): 300 mL

## 2023-12-02 MED ORDER — OXYCODONE HCL 5 MG PO TABS
2.5000 mg | ORAL_TABLET | Freq: Three times a day (TID) | ORAL | Status: DC | PRN
  Administered 2023-12-03 – 2023-12-05 (×3): 5 mg
  Administered 2023-12-07: 2.5 mg
  Administered 2023-12-10 – 2023-12-11 (×2): 5 mg
  Filled 2023-12-02 (×6): qty 1

## 2023-12-02 MED ORDER — LOPERAMIDE HCL 1 MG/7.5ML PO SUSP
6.0000 mg | Freq: Four times a day (QID) | ORAL | Status: DC
  Administered 2023-12-02 – 2023-12-03 (×5): 6 mg
  Filled 2023-12-02 (×6): qty 45

## 2023-12-02 NOTE — Plan of Care (Signed)
  Problem: Education: Goal: Knowledge of General Education information will improve Description: Including pain rating scale, medication(s)/side effects and non-pharmacologic comfort measures Outcome: Progressing   Problem: Health Behavior/Discharge Planning: Goal: Ability to manage health-related needs will improve Outcome: Progressing   Problem: Clinical Measurements: Goal: Ability to maintain clinical measurements within normal limits will improve Outcome: Progressing Goal: Will remain free from infection Outcome: Progressing Goal: Diagnostic test results will improve Outcome: Progressing Goal: Respiratory complications will improve Outcome: Progressing Goal: Cardiovascular complication will be avoided Outcome: Progressing   Problem: Activity: Goal: Risk for activity intolerance will decrease Outcome: Progressing   Problem: Coping: Goal: Level of anxiety will decrease Outcome: Progressing   Problem: Pain Managment: Goal: General experience of comfort will improve and/or be controlled Outcome: Progressing   Problem: Safety: Goal: Ability to remain free from injury will improve Outcome: Progressing   Problem: Skin Integrity: Goal: Risk for impaired skin integrity will decrease Outcome: Progressing   Problem: Activity: Goal: Ability to tolerate increased activity will improve Outcome: Progressing   Problem: Respiratory: Goal: Ability to maintain a clear airway and adequate ventilation will improve Outcome: Progressing   Problem: Role Relationship: Goal: Method of communication will improve Outcome: Progressing   Problem: Education: Goal: Individualized Educational Video(s) Outcome: Progressing   Problem: Coping: Goal: Ability to adjust to condition or change in health will improve Outcome: Progressing   Problem: Fluid Volume: Goal: Ability to maintain a balanced intake and output will improve Outcome: Progressing   Problem: Health Behavior/Discharge  Planning: Goal: Ability to identify and utilize available resources and services will improve Outcome: Progressing Goal: Ability to manage health-related needs will improve Outcome: Progressing   Problem: Metabolic: Goal: Ability to maintain appropriate glucose levels will improve Outcome: Progressing   Problem: Nutritional: Goal: Maintenance of adequate nutrition will improve Outcome: Progressing Goal: Progress toward achieving an optimal weight will improve Outcome: Progressing   Problem: Skin Integrity: Goal: Risk for impaired skin integrity will decrease Outcome: Progressing   Problem: Tissue Perfusion: Goal: Adequacy of tissue perfusion will improve Outcome: Progressing   Problem: Safety: Goal: Non-violent Restraint(s) Outcome: Progressing   Problem: Education: Goal: Knowledge about tracheostomy care/management will improve Outcome: Progressing   Problem: Activity: Goal: Ability to tolerate increased activity will improve Outcome: Progressing   Problem: Health Behavior/Discharge Planning: Goal: Ability to manage tracheostomy will improve Outcome: Progressing   Problem: Respiratory: Goal: Patent airway maintenance will improve Outcome: Progressing   Problem: Role Relationship: Goal: Ability to communicate will improve Outcome: Progressing

## 2023-12-02 NOTE — Progress Notes (Signed)
 Pt transported from 4N 27 to 2M12 without complications.

## 2023-12-02 NOTE — Progress Notes (Signed)
 Physical Therapy Treatment Patient Details Name: Jenny White MRN: [id number] DOB: [birth year] Today's Date: 12/02/2023   History of Present Illness 78 yo female presenting with n/v on 3/4. CT(+) SBO. 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 Trach placed 3/21. PMH COPD, GERD, nephrolithiasis, HTN, OSA, lung CA (05/2023) s/p radiation, arthritis, spinal stenosis L3-4 fusion.    PT Comments  Patient agreeable to participate with therapy once aroused. Patient is pleasant and follows commands well, however functional activities are limited due to increased fatigue. Patient is requires MaxA +2 to assist with rolling on her R side and demonstrates the ability to reach for the [facility] rail. Once on her side, patient requires MaxA +2 to transition from sidelying > sitting, demonstrating minimal ability to push up using her R forearm. Once seated EOB, patient able to perform seated marches before deferring additional functional activities due to increased fatigue. Patient able tolerate ~95min sitting EOB before transferring back to bed. Acute PT will continue to follow-up to maximize her functional level of independence.    If plan is discharge home, recommend the following: Two people to help with walking and/or transfers;Two people to help with bathing/dressing/bathroom   Can travel by private vehicle     No  Equipment Recommendations  BSC/3in1;Wheelchair cushion (measurements PT);Wheelchair (measurements PT)    Recommendations for Other Services       Precautions / Restrictions Precautions Precautions: Fall Recall of Precautions/Restrictions: Impaired Restrictions Weight Bearing Restrictions Per Provider Order: No     Mobility  Bed Mobility Overal bed mobility: Needs Assistance Bed Mobility: Rolling, Sidelying to Sit, Sit to Supine Rolling: Max assist, +2 for physical assistance Sidelying to sit: Max assist, +2 for physical  assistance Supine to sit: Total assist, +2 for physical assistance     General bed mobility comments: MaxA +2 -- Verbal/tactile cues provided for sequencing. Pt required assistance bring LLE OOB, elevating trunk to upright position and scooting forward until B feet were flat on the floor. Pt able to use some R Forearm support to transition from sidelying > sitting    Transfers                        Ambulation/Gait                   Stairs             Wheelchair Mobility     Tilt Bed    Modified Rankin (Stroke Patients Only)       Balance Overall balance assessment: Needs assistance Sitting-balance support: Feet supported, Bilateral upper extremity supported Sitting balance-Leahy Scale: Poor Sitting balance - Comments: Initially pt required MIN A to maintain balance but quickly fatigued and required MAX A to sit upright at EOB Postural control: Posterior lean, Right lateral lean                                  Communication Communication Communication: Impaired Factors Affecting Communication: Trach/intubated  Cognition Arousal: Alert Behavior During Therapy: WFL for tasks assessed/performed   PT - Cognitive impairments: Difficult to assess Difficult to assess due to: Tracheostomy                     PT - Cognition Comments: pt nodding yes/no mostly consistently, not able to verbalize given trach Following commands: Impaired Following commands impaired:  Follows one step commands with increased time    Cueing Cueing Techniques: Verbal cues, Gestural cues, Tactile cues  Exercises General Exercises - Lower Extremity Hip Flexion/Marching: AROM, Both, 10 reps, Seated, Strengthening    General Comments General comments (skin integrity, edema, etc.): 97% on trach collar (40% FIO2 on 10L) HR 96bpm      Pertinent Vitals/Pain      Home Living                          Prior Function            PT Goals  (current goals can now be found in the care plan section) Acute Rehab PT Goals Patient Stated Goal: return home PT Goal Formulation: With patient Time For Goal Achievement: 12/02/23 Potential to Achieve Goals: Fair Progress towards PT goals: Progressing toward goals    Frequency    Min 3X/week      PT Plan      Co-evaluation              AM-PAC PT "6 Clicks" Mobility   Outcome Measure  Help needed turning from your back to your side while in a flat bed without using bedrails?: A Lot Help needed moving from lying on your back to sitting on the side of a flat bed without using bedrails?: Total Help needed moving to and from a bed to a chair (including a wheelchair)?: Total Help needed standing up from a chair using your arms (e.g., wheelchair or bedside chair)?: Total Help needed to walk in hospital room?: Total Help needed climbing 3-5 steps with a railing? : Total 6 Click Score: 7    End of Session Equipment Utilized During Treatment: Oxygen Activity Tolerance: Patient limited by fatigue Patient left: in bed;with call bell/phone within reach;with bed alarm set Nurse Communication: Mobility status PT Visit Diagnosis: Other abnormalities of gait and mobility (R26.89);Difficulty in walking, not elsewhere classified (R26.2);Muscle weakness (generalized) (M62.81)     Time: 1610-9604 PT Time Calculation (min) (ACUTE ONLY): 14 min  Charges:    $Therapeutic Activity: 8-22 mins PT General Charges $$ ACUTE PT VISIT: 1 Visit                    Doreen Beam, SPT   Jenny White 12/02/2023, 5:11 PM

## 2023-12-02 NOTE — Progress Notes (Signed)
Patient ID: Jenny White, female   DOB: Dec 01, 1945, 78 y.o.   MRN: [id number] 19 Days Post-Op    Subjective: Ileostomy output 2L.  UOP appropriate.  She is interested in eating but denies other complaints.   ROS negative except as listed above. Objective: Vital signs in last 24 hours: Temp:  [97.7 F (36.5 C)-98.4 F (36.9 C)] 98.1 F (36.7 C) (03/25 0400) Pulse Rate:  [79-111] 87 (03/25 0759) Resp:  [15-34] 22 (03/25 0730) BP: (99-167)/(40-82) 128/66 (03/25 0730) SpO2:  [89 %-100 %] 99 % (03/25 0730) FiO2 (%):  [40 %] 40 % (03/25 0759) Weight:  [47 kg] 47 kg (03/25 0402) Last BM Date : 12/01/23  Intake/Output from previous day: 03/24 0701 - 03/25 0700 In: 1600.1 [I.V.:30; NG/GT:1370; IV Piggyback:200.1] Out: 3425 [Urine:1475; Stool:1950] Intake/Output this shift: No intake/output data recorded.  GI: soft, NT today, incision OK, a lot of liquid stool in bag  Ileostomy: 2L/24h Lab Results: CBC  Recent Labs    11/30/23 0410 12/02/23 0412  WBC 8.8 11.4*  HGB 9.4* 8.2*  HCT 31.5* 27.7*  PLT 310 429*   BMET Recent Labs    12/01/23 0333 12/02/23 0412  NA 155* 155*  K 4.0 4.0  CL 116* 116*  CO2 34* 34*  GLUCOSE 113* 103*  BUN 31* 35*  CREATININE 0.51 0.62  CALCIUM 9.7 9.6   PT/INR No results for input(s): "LABPROT", "INR" in the last 72 hours. ABG No results for input(s): "PHART", "HCO3" in the last 72 hours.  Invalid input(s): "PCO2", "PO2"  Studies/Results: No results found.   Anti-infectives: Anti-infectives (From admission, onward)    Start     Dose/Rate Route Frequency Ordered Stop   11/25/23 1145  meropenem (MERREM) 1 g in sodium chloride 0.9 % 100 mL IVPB        1 g 200 mL/hr over 30 Minutes Intravenous Every 12 hours 11/25/23 1045 12/01/23 2100   11/20/23 1045  tobramycin (PF) (TOBI) nebulizer solution 300 mg  Status:  Discontinued        300 mg Nebulization 2 times daily 11/20/23 0952 11/23/23 1133   11/17/23 1000  meropenem (MERREM) 1 g in  sodium chloride 0.9 % 100 mL IVPB  Status:  Discontinued        1 g 200 mL/hr over 30 Minutes Intravenous Every 12 hours 11/17/23 0832 11/23/23 1133   11/15/23 1400  piperacillin-tazobactam (ZOSYN) IVPB 3.375 g  Status:  Discontinued        3.375 g 100 mL/hr over 30 Minutes Intravenous Every 8 hours 11/15/23 1223 11/17/23 0832   11/11/23 2000  piperacillin-tazobactam (ZOSYN) IVPB 3.375 g  Status:  Discontinued        3.375 g 12.5 mL/hr over 240 Minutes Intravenous Every 8 hours 11/11/23 1233 11/15/23 1223   11/11/23 1245  piperacillin-tazobactam (ZOSYN) IVPB 3.375 g        3.375 g 100 mL/hr over 30 Minutes Intravenous  Once 11/11/23 1233 11/11/23 1316       Assessment/Plan: POD 21/19 S/P ex-lap small bowel resection, left open; ex-lap, small bowel resection, ileostomy and closure, Dr. Dwain Sarna for SBO - Continue fiber. Will increase imodium to 6mg  QID. Adding lomotil  - Staples can be removed - VTE: on lovenox 60 mg BID - Remainder of care per CCM. Surgery will continue to follow.   LOS: 21 days   Melody Haver, MD [facility] Surgery Please see Amion for pager number during day hours 7:00am-4:30pm  12/02/2023 

## 2023-12-02 NOTE — Progress Notes (Signed)
 RN called RT, pt stated that she is feeling SOB. Per CCM, pt can go back on full ventilatory support. RT placed pt back on full ventilatory support at previous settings. Pt tolerating well at this time. RN notified.

## 2023-12-02 NOTE — TOC Initial Note (Signed)
 Transition of Care [facility]) - Initial/Assessment Note    Patient Details  Name: Jenny White MRN: [id number] Date of Birth: Feb 21, 1946  [facility]) CM/SW Contact:    Glennon Mac, RN Phone Number: 12/02/2023, 11:50am  Clinical Narrative:                 78 yo female presenting with n/v on 3/4. CT(+) SBO. 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 Trach placed 3/21.  At baseline, pt lives at home with son and completes ADLs and functional mobility with MOD I using RW.  LTAC consult placed by MD; met with daughter in law to discuss LTAC choice.  She states that she and her husband have discussed this, and they prefer [facility].  She states that they know patient is very weak and it will be a long process to get her back to baseline.  They are hoping for possible inpatient rehab after LTAC stay.  Will have [facility] Admissions Coordinator check for LTAC eligibility.    Expected Discharge Plan: Long Term Acute Care (LTAC) Barriers to Discharge: Continued Medical Work up   Patient Goals and CMS Choice   CMS Medicare.gov Compare Post Acute Care list provided to:: Patient Represenative (must comment) (Daughter in [place]) Choice offered to / list presented to : Adult Children      Expected Discharge Plan and Services   Discharge Planning Services: CM Consult Post Acute Care Choice: Long Term Acute Care (LTAC) Living arrangements for the past 2 months: Single Family Home                                      Prior Living Arrangements/Services Living arrangements for the past 2 months: Single Family Home Lives with:: Adult Children Patient language and need for interpreter reviewed:: Yes Do you feel safe going back to the place where you live?: Yes      Need for Family Participation in Patient Care: Yes (Comment) Care giver support system in place?: Yes (comment) Current home services:  DME Criminal Activity/Legal Involvement Pertinent to Current Situation/Hospitalization: No - Comment as needed  Activities of Daily Living   ADL Screening (condition at time of admission) Independently performs ADLs?: Yes (appropriate for developmental age) Is the patient deaf or have difficulty hearing?: No Does the patient have difficulty seeing, even when wearing glasses/contacts?: No Does the patient have difficulty concentrating, remembering, or making decisions?: No  Permission Sought/Granted Permission sought to share information with : Family Supports, Oceanographer granted to share information with : Yes, Verbal Permission Granted  Share Information with NAME: Kynzli Rease     Permission granted to share info w Relationship: daughter  Permission granted to share info w Contact Information: [phone]  Emotional Assessment Appearance:: Appears stated age Attitude/Demeanor/Rapport: Engaged Affect (typically observed): Accepting   Alcohol / Substance Use: Not Applicable Psych Involvement: No (comment)  Admission diagnosis:  S/P exploratory laparotomy [Z98.890] Patient Active Problem List   Diagnosis Date Noted   Protein-calorie malnutrition, severe 11/22/2023   Acute respiratory failure with hypoxia (HCC) 11/21/2023   Small bowel obstruction due to adhesions (HCC) 11/19/2023   Aspiration pneumonia (HCC) 11/19/2023   Septic shock (HCC) 11/19/2023   Paroxysmal atrial fibrillation (HCC) 11/19/2023   AKI (acute kidney injury) (HCC) 11/19/2023   Unspecified protein-calorie malnutrition (HCC) 11/19/2023   S/P  exploratory laparotomy 11/11/2023   Shock (HCC) 11/11/2023   Malignant neoplasm of lower lobe of right lung (HCC) 06/16/2023   Malignant neoplasm of upper lobe of left lung (HCC) 06/16/2023   Lung nodules 05/07/2023   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   Lumbar stenosis 06/28/2020   PCP:  Danella Penton, MD Pharmacy:    [facility] [address] [place] - [address] [place] [address] Phone: [phone] Fax: [phone]     Social Drivers of Health (SDOH) Social History: SDOH Screenings   Food Insecurity: No Food Insecurity (11/11/2023)  Housing: Low Risk  (11/11/2023)  Transportation Needs: No Transportation Needs (11/11/2023)  Utilities: Not At Risk (11/11/2023)  Depression (PHQ2-9): Low Risk  (06/17/2023)  Financial Resource Strain: Low Risk  (11/06/2023)   Received from [facility]  Social Connections: Unknown (11/11/2023)  Tobacco Use: Medium Risk (11/11/2023)   SDOH Interventions:     Readmission Risk Interventions     No data to display         Quintella Baton, RN, BSN  Trauma/Neuro ICU Case Manager [phone]

## 2023-12-02 NOTE — Progress Notes (Signed)
 NAME:  Jenny White, MRN:  [id number], DOB:  [birth year], LOS: 21 ADMISSION DATE:  11/11/2023 CONSULTATION DATE:  11/11/2023 REFERRING MD:  Dwain Sarna - CCS CHIEF COMPLAINT: Shock  History of Present Illness:  Jenny White 78 year old woman with a pmhx significant for  who presented to the emergency room with complaints of abdominal pain with N/V for the past week. She states the abdominal  pain got worsened after dinner. She reported experiencing nausea and  vomiting this morning.   In the ED  her labs were significant for a lactate 4.1 After fluid resuscitation, Lactate decreased to 2.5. CT scan showed high grade small bowel obstruction with transition point in the right lower quadrant along the distal ileum. Some trace of ascites and mesenteric stranding.Concern for bowel wall ischemia. Broad spectrum antibiotics ( zosyn) was started, blood cultures pending.   Patient was taken to the ER emergently with CCS 3/4 for Ex-lap / LOA. Intraoperative course  was notable for free fluid on entry, with closed loop bowel obstruction noted secondary to tight band adhesion. Patient left in discontinuity/ anastomosis not completed with open abdomen and plan for second look in 24-48 hrs. Patient remained intubated and was transferred to ICU in critical condition.   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 Elevated LA found to have SBO taken emergently to surgery for EX-LAP.  3/7 extubated reintubated 3/10 extubated again 3/11 OFF NIPPV 3/12 got ativan at HS. Sleepy and difficult to arouse all day.  3/13 transferred to ICU for obtundation. Re-intubated.  3/16 Extubated  3/17 zyprexa x 1 overnight,  Obtunded , failed BiPAP,  reintubated 3/17 several conversations with unhappy family members, offered transfer to different facility, called [facility]>> no beds 3/18 resumed BuSpar, gabapentin, stopped oxycodone & fent gtt since oversedated 3/19 Amiodarone IV extravasated LUE 3/20 dex, low dose NE, diuresing  3/21 tracheostomy placed at bedside  Interim History / Subjective:   Ostomy output remains high at 2L  Tolerated trach collar overnight  Objective:  Blood pressure 128/66, pulse 90, temperature 98.1 F (36.7 C), temperature source Oral, resp. rate (!) 22, height 5' 7.01" (1.702 m), weight 47 kg, SpO2 99%.    Vent Mode: PRVC FiO2 (%):  [40 %] 40 % Set Rate:  [26 bmp] 26 bmp Vt Set:  [420 mL] 420 mL PEEP:  [5 cmH20] 5 cmH20 Plateau Pressure:  [21 cmH20] 21 cmH20   Intake/Output Summary (Last 24 hours) at 12/02/2023 0758 Last data filed at 12/02/2023 0700 Gross per 24 hour  Intake 1600.07 ml  Output 3425 ml  Net -1824.93 ml   Filed Weights   11/30/23 0356 12/01/23 0418 12/02/23 0402  Weight: 52.1 kg 46.9 kg 47 kg   Physical Exam: General: Elderly-appearing, no acute distress HENT: [place], AT Neck: Trach in place, c/d/I Eyes: EOMI, no scleral icterus Respiratory: Clear to auscultation bilaterally.  No crackles, wheezing or rales Cardiovascular: RRR, -M/R/G, no JVD GI: BS+, nontender, ostomy  Extremities:-Edema,-tenderness Neuro:Awake and alert, CNII-XII grossly intact, follows commands GU, Foley in place  Na 155 Cl 116 WBC 11.4, increased   Assessment & Plan:   Acute on chronic resp failure with hypoxia, hypercarbia ETT 3/4 >>3/7 >> 3/10, 3/13- 3/16, 3/17 >>3/21 Severe COPD -FEV1 30%  RLL Cancer s/p SBRT S/p tracheostomy 3/21 - MV support - TCT as tolerated - Routine trach care - VAP/ PPI - avoid ativan/ zyprexa - Budesonide/Brovana/Yupelri, prn xopenex - ongoing pulm hygiene, PT/ OT - change PRN fentanyl to PRN low dose oxy  HAP:  Aeromonas and Hafnia Alvei pneumonia > both S to  meropenmem - remains on low dose NE, goal MAP > 65, suspect 2/2 due to ongoing sedation needs - S/p meropenem for total of 14 days (end 3/24)  Hypotension - resolved - DC midodrine  New onset paroxymal atrial fibrillation - currently in NSR. May be secondary to acute illness - echo 3/6 EF 60-65%, G1DD, mildly reduced RVSF, imaging difficult - Eliquis - Remain off amio. Monitor on tele - optimize electrolytes  High grade small bowel obstruction s/p ex lap 3/4 with resection and end ileostomy Severe protein calorie malnutrition - Surgery following for postop care. Increasing immodium - fiber for optimal goal stool output 800-1L - EN per RD, cortrak  Acute metabolic encephalopathy -likely related to hypercarbia Concern for opiate withdrawal Severe anxiety at baseline - stop precedex, prn fentanyl  Following commands - cont gabapentin and BuSpar (increased to 10mg  BID 3/23)  Hypernatremia - Increase FWF  Diabetes type 2  - CBG q4, SSI prn  Amiodarone extravasation in left arm -s/p hyaluronidase injection - Limb elevation/ wound care  Deconditioning - PT/ OT   Best Practice: (right click and "Reselect all SmartList Selections" daily)   Diet/type: tubefeeds DVT prophylaxis: DOAC - eliquis GI prophylaxis: H2B Lines: PICC Foley:  N/A Code Status:  full code Last date of multidisciplinary goals of care discussion: ongoing, planning for LTACH  The patient is critically ill with multiple organ systems failure and requires high complexity decision making for assessment and support, frequent evaluation and titration of therapies, application of advanced monitoring technologies and extensive interpretation of multiple databases.  Independent Critical Care Time: 35 Minutes.   Mechele Collin, M.D. [facility] Pulmonary/Critical Care Medicine 12/02/2023 8:05 AM   Please see Amion for pager number to reach on-call Pulmonary and Critical Care Team.

## 2023-12-03 ENCOUNTER — Inpatient Hospital Stay (HOSPITAL_COMMUNITY)

## 2023-12-03 DIAGNOSIS — J9601 Acute respiratory failure with hypoxia: Secondary | ICD-10-CM | POA: Diagnosis not present

## 2023-12-03 DIAGNOSIS — J449 Chronic obstructive pulmonary disease, unspecified: Secondary | ICD-10-CM | POA: Diagnosis not present

## 2023-12-03 DIAGNOSIS — Z930 Tracheostomy status: Secondary | ICD-10-CM | POA: Diagnosis not present

## 2023-12-03 LAB — CBC
HCT: 25.3 % — ABNORMAL LOW (ref 36.0–46.0)
Hemoglobin: 7.8 g/dL — ABNORMAL LOW (ref 12.0–15.0)
MCH: 30.1 pg (ref 26.0–34.0)
MCHC: 30.8 g/dL (ref 30.0–36.0)
MCV: 97.7 fL (ref 80.0–100.0)
Platelets: 394 10*3/uL (ref 150–400)
RBC: 2.59 MIL/uL — ABNORMAL LOW (ref 3.87–5.11)
RDW: 17.1 % — ABNORMAL HIGH (ref 11.5–15.5)
WBC: 12.4 10*3/uL — ABNORMAL HIGH (ref 4.0–10.5)
nRBC: 0.4 % — ABNORMAL HIGH (ref 0.0–0.2)

## 2023-12-03 LAB — PHOSPHORUS: Phosphorus: 2.5 mg/dL (ref 2.5–4.6)

## 2023-12-03 LAB — BASIC METABOLIC PANEL
Anion gap: 8 (ref 5–15)
BUN: 37 mg/dL — ABNORMAL HIGH (ref 8–23)
CO2: 32 mmol/L (ref 22–32)
Calcium: 9.5 mg/dL (ref 8.9–10.3)
Chloride: 109 mmol/L (ref 98–111)
Creatinine, Ser: 0.62 mg/dL (ref 0.44–1.00)
GFR, Estimated: >60 mL/min (ref >60)
Glucose, Bld: 106 mg/dL — ABNORMAL HIGH (ref 70–99)
Potassium: 3.9 mmol/L (ref 3.5–5.1)
Sodium: 149 mmol/L — ABNORMAL HIGH (ref 135–145)

## 2023-12-03 LAB — GLUCOSE, CAPILLARY
Glucose-Capillary: 107 mg/dL — ABNORMAL HIGH (ref 70–99)
Glucose-Capillary: 86 mg/dL (ref 70–99)
Glucose-Capillary: 86 mg/dL (ref 70–99)
Glucose-Capillary: 95 mg/dL (ref 70–99)
Glucose-Capillary: 97 mg/dL (ref 70–99)

## 2023-12-03 LAB — MAGNESIUM: Magnesium: 1.8 mg/dL (ref 1.7–2.4)

## 2023-12-03 MED ORDER — BANATROL TF EN LIQD: 60.0000 mL | Freq: Four times a day (QID) | ENTERAL | Status: DC

## 2023-12-03 MED ORDER — HYDROMORPHONE HCL 1 MG/ML IJ SOLN
0.5000 mg | Freq: Once | INTRAMUSCULAR | Status: AC | PRN
  Administered 2023-12-03: 0.5 mg via INTRAVENOUS
  Filled 2023-12-03: qty 0.5

## 2023-12-03 MED ORDER — BANATROL TF EN LIQD
120.0000 mL | Freq: Four times a day (QID) | ENTERAL | Status: DC
  Administered 2023-12-03 (×3): 120 mL
  Filled 2023-12-03 (×3): qty 120

## 2023-12-03 MED ORDER — LOPERAMIDE HCL 1 MG/7.5ML PO SUSP
8.0000 mg | Freq: Four times a day (QID) | ORAL | Status: DC
  Administered 2023-12-03 (×3): 8 mg
  Filled 2023-12-03 (×5): qty 60

## 2023-12-03 MED ORDER — DIPHENOXYLATE-ATROPINE 2.5-0.025 MG PO TABS
1.0000 | ORAL_TABLET | Freq: Four times a day (QID) | ORAL | Status: DC
  Administered 2023-12-03 – 2023-12-04 (×5): 1
  Filled 2023-12-03 (×5): qty 1

## 2023-12-03 MED ORDER — GUAIFENESIN 100 MG/5ML PO LIQD
5.0000 mL | Freq: Four times a day (QID) | ORAL | Status: DC
  Administered 2023-12-03 – 2023-12-11 (×34): 5 mL
  Filled 2023-12-03 (×34): qty 5

## 2023-12-03 MED ORDER — APIXABAN 5 MG PO TABS
5.0000 mg | ORAL_TABLET | Freq: Two times a day (BID) | ORAL | Status: DC
  Administered 2023-12-03 – 2023-12-11 (×16): 5 mg
  Filled 2023-12-03 (×16): qty 1

## 2023-12-03 NOTE — Progress Notes (Signed)
 SLP Cancellation Note  Patient Details Name: Jenny White MRN: [id number] DOB: 29-Nov-1945   Cancelled treatment:       Reason Eval/Treat Not Completed: Medical issues which prohibited therapy. Patient current with trach on vent and plan is 30-60 minute long trials of trach collar. SLP will continue to follow and plan for coordination of PMV trial when on trach collar if appropriate.  Angela Nevin, MA, CCC-SLP Speech Therapy

## 2023-12-03 NOTE — TOC Progression Note (Signed)
 Transition of Care [facility]) - Progression Note    Patient Details  Name: ARIENNE GARTIN MRN: [id number] Date of Birth: [birth year]  [facility]) CM/SW Contact  Harriet Masson, RN Phone Number: 12/03/2023, 12:49 PM  Clinical Narrative:    Per [facility] Medical Director, we will need to wait until output has decreased and is stable so we can make it a smooth transition over to [facility].  MD aware.  TOC following.    Expected Discharge Plan: Long Term Acute Care (LTAC) Barriers to Discharge: Continued Medical Work up  Expected Discharge Plan and Services   Discharge Planning Services: CM Consult Post Acute Care Choice: Long Term Acute Care (LTAC) Living arrangements for the past 2 months: Single Family Home                                       Social Determinants of Health (SDOH) Interventions SDOH Screenings   Food Insecurity: No Food Insecurity (11/11/2023)  Housing: Low Risk  (11/11/2023)  Transportation Needs: No Transportation Needs (11/11/2023)  Utilities: Not At Risk (11/11/2023)  Depression (PHQ2-9): Low Risk  (06/17/2023)  Financial Resource Strain: Low Risk  (11/06/2023)   Received from [facility]  Social Connections: Unknown (11/11/2023)  Tobacco Use: Medium Risk (11/11/2023)    Readmission Risk Interventions     No data to display

## 2023-12-03 NOTE — Progress Notes (Signed)
 Physical Therapy Treatment Patient Details Name: Jenny White MRN: [id number] DOB: Aug 27, 1946 Today's Date: 12/03/2023   History of Present Illness 78 yo female presenting with n/v on 3/4. CT(+) SBO. 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 Trach placed 3/21. PMH COPD, GERD, nephrolithiasis, HTN, OSA, lung CA (05/2023) s/p radiation, arthritis, spinal stenosis L3-4 fusion.    PT Comments  Pt able to tolerate transfer today. Used Stedy with +2 max assist. Progress slow, steady. Will need extended rehab. Plan currently for LTAC.     If plan is discharge home, recommend the following: Two people to help with walking and/or transfers;Two people to help with bathing/dressing/bathroom   Can travel by private vehicle     No  Equipment Recommendations  BSC/3in1;Wheelchair cushion (measurements PT);Wheelchair (measurements PT);Hoyer lift;Hospital bed    Recommendations for Other Services       Precautions / Restrictions Precautions Precautions: Fall;Other (comment) Recall of Precautions/Restrictions: Impaired Precaution/Restrictions Comments: ostomy, trach, midline abd incision, ostomy, cortrak Restrictions Weight Bearing Restrictions Per Provider Order: No     Mobility  Bed Mobility Overal bed mobility: Needs Assistance     Sidelying to sit: Max assist, +2 for physical assistance, HOB elevated       General bed mobility comments: Assist to bring legs off EOB, elevate trunk into sitting and bring hips to EOB.    Transfers Overall transfer level: Needs assistance Equipment used: Ambulation equipment used Transfers: Sit to/from Stand, Bed to chair/wheelchair/BSC Sit to Stand: +2 physical assistance, Max assist, Via lift equipment           General transfer comment: Heavy assist using bed pad to bring hips up. Used Stedy for bed to chair. Maxisky pad in place for return to bed. Transfer via Lift Equipment:  Stedy  Ambulation/Gait                   Stairs             Wheelchair Mobility     Tilt Bed    Modified Rankin (Stroke Patients Only)       Balance Overall balance assessment: Needs assistance Sitting-balance support: Feet supported, Bilateral upper extremity supported Sitting balance-Leahy Scale: Poor Sitting balance - Comments: Min to mod assist for static sitting Postural control: Posterior lean, Right lateral lean Standing balance support: Bilateral upper extremity supported Standing balance-Leahy Scale: Zero Standing balance comment: +2 max assist with [employer]                            Communication Communication Communication: Impaired Factors Affecting Communication: Trach/intubated  Cognition Arousal: Alert Behavior During Therapy: WFL for tasks assessed/performed   PT - Cognitive impairments: Difficult to assess Difficult to assess due to: Tracheostomy                       Following commands: Impaired Following commands impaired: Follows one step commands with increased time    Cueing Cueing Techniques: Verbal cues, Gestural cues, Tactile cues  Exercises      General Comments General comments (skin integrity, edema, etc.): VSS on vent      Pertinent Vitals/Pain Pain Assessment Pain Assessment: Faces Faces Pain Scale: No hurt    Home Living                          Prior Function  PT Goals (current goals can now be found in the care plan section) Acute Rehab PT Goals Patient Stated Goal: not stated Progress towards PT goals: Progressing toward goals    Frequency    Min 2X/week      PT Plan      Co-evaluation              AM-PAC PT "6 Clicks" Mobility   Outcome Measure  Help needed turning from your back to your side while in a flat bed without using bedrails?: A Lot Help needed moving from lying on your back to sitting on the side of a flat bed without using  bedrails?: Total Help needed moving to and from a bed to a chair (including a wheelchair)?: Total Help needed standing up from a chair using your arms (e.g., wheelchair or bedside chair)?: Total Help needed to walk in hospital room?: Total Help needed climbing 3-5 steps with a railing? : Total 6 Click Score: 7    End of Session Equipment Utilized During Treatment: Oxygen Activity Tolerance: Patient limited by fatigue Patient left: in chair;with chair alarm set;with call bell/phone within reach Nurse Communication: Mobility status;Need for lift equipment (Nurse assist with all mobility) PT Visit Diagnosis: Other abnormalities of gait and mobility (R26.89);Difficulty in walking, not elsewhere classified (R26.2);Muscle weakness (generalized) (M62.81)     Time: 1610-9604 PT Time Calculation (min) (ACUTE ONLY): 26 min  Charges:    $Therapeutic Activity: 23-37 mins PT General Charges $$ ACUTE PT VISIT: 1 Visit                     [facility] PT Acute Rehabilitation Services Office [phone]    Angelina Ok [facility] 12/03/2023, 5:58 PM

## 2023-12-03 NOTE — Progress Notes (Addendum)
 Patient ID: Jenny White, female   DOB: [birth year], 78 y.o.   MRN: [id number] 20 Days Post-Op    Subjective: Having some mild abdominal pain but denies other complaints. Appears sleepy this am  Objective: Vital signs in last 24 hours: Temp:  [97.6 F (36.4 C)-99.1 F (37.3 C)] 98 F (36.7 C) (03/26 0743) Pulse Rate:  [66-98] 89 (03/26 0730) Resp:  [10-33] 24 (03/26 0730) BP: (88-144)/(39-66) 129/56 (03/26 0700) SpO2:  [85 %-100 %] 100 % (03/26 0730) FiO2 (%):  [40 %] 40 % (03/26 0400) Last BM Date : 12/02/23  Intake/Output from previous day: 03/25 0701 - 03/26 0700 In: 1280 [I.V.:30; NG/GT:1250] Out: 2985 [Urine:925; Stool:2060] Intake/Output this shift: Total I/O In: 300 [NG/GT:300] Out: 150 [Urine:150]  GI: soft, NT, incision intact with small amount of eschar. No erythema or discharge, a lot of liquid stool in bag  Ileostomy: 2L/24h Lab Results: CBC  Recent Labs    12/02/23 0412 12/03/23 0346  WBC 11.4* 12.4*  HGB 8.2* 7.8*  HCT 27.7* 25.3*  PLT 429* 394   BMET Recent Labs    12/02/23 0412 12/03/23 0346  NA 155* 149*  K 4.0 3.9  CL 116* 109  CO2 34* 32  GLUCOSE 103* 106*  BUN 35* 37*  CREATININE 0.62 0.62  CALCIUM 9.6 9.5   PT/INR No results for input(s): "LABPROT", "INR" in the last 72 hours. ABG No results for input(s): "PHART", "HCO3" in the last 72 hours.  Invalid input(s): "PCO2", "PO2"  Studies/Results: No results found.   Anti-infectives: Anti-infectives (From admission, onward)    Start     Dose/Rate Route Frequency Ordered Stop   11/25/23 1145  meropenem (MERREM) 1 g in sodium chloride 0.9 % 100 mL IVPB        1 g 200 mL/hr over 30 Minutes Intravenous Every 12 hours 11/25/23 1045 12/01/23 2100   11/20/23 1045  tobramycin (PF) (TOBI) nebulizer solution 300 mg  Status:  Discontinued        300 mg Nebulization 2 times daily 11/20/23 0952 11/23/23 1133   11/17/23 1000  meropenem (MERREM) 1 g in sodium chloride 0.9 % 100 mL IVPB  Status:   Discontinued        1 g 200 mL/hr over 30 Minutes Intravenous Every 12 hours 11/17/23 0832 11/23/23 1133   11/15/23 1400  piperacillin-tazobactam (ZOSYN) IVPB 3.375 g  Status:  Discontinued        3.375 g 100 mL/hr over 30 Minutes Intravenous Every 8 hours 11/15/23 1223 11/17/23 0832   11/11/23 2000  piperacillin-tazobactam (ZOSYN) IVPB 3.375 g  Status:  Discontinued        3.375 g 12.5 mL/hr over 240 Minutes Intravenous Every 8 hours 11/11/23 1233 11/15/23 1223   11/11/23 1245  piperacillin-tazobactam (ZOSYN) IVPB 3.375 g        3.375 g 100 mL/hr over 30 Minutes Intravenous  Once 11/11/23 1233 11/11/23 1316       Assessment/Plan: POD 22/20 S/P ex-lap small bowel resection, left open; ex-lap, small bowel resection, ileostomy and closure, Dr. Dwain Sarna for SBO - ileostomy output 2060 ml/24h - Continue fiber - increase today. increased imodium to 6mg  QID 3/25 - increase again today. Added lomotil 3/25. Continue tele - AF, WBC 12.4 (11.4) - Staples out - Remainder of care per CCM. Surgery will continue to follow.  FEN: post pyloric TFs ID: none VTE: eliquis bid   LOS: 22 days   Eric Form, [facility] 12/03/2023, 7:59  AM Please see Amion for pager number during day hours 7:00am-4:30pm   12/03/2023
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
## 2023-12-03 NOTE — Progress Notes (Signed)
 Nutrition Follow-up  DOCUMENTATION CODES:  Severe malnutrition in context of chronic illness (COPD and lung cancer)  INTERVENTION:  Recommend TPN or supplemental TPN due to pt's weight loss over hospital stay and high ostomy output; concerned about malabsorption since skin integrity worsening and pressure wounds now present even with tube feeds at goal rate for 9 days. Reached to [facility] and MD suggesting TPN feeds.  Tube feeding via post-pyloric Cortrak tube: Vital 1.5 at 50 ml/h (1200 ml per day)  Prosource TF20 60 ml daily  Provides 1880 kcal, 101 gm protein, 916 ml free water daily  Recommend when sodium levels normalize: 200 ml every 6 hours  Total free water: 1716 ml   Lomotil PO, Banatrol TF, imodium TF  NUTRITION DIAGNOSIS:   Severe Malnutrition related to chronic illness (COPD and lung cancer) as evidenced by severe muscle depletion, severe fat depletion. Ongoing.   GOAL:   Patient will meet greater than or equal to 90% of their needs Progressing with advancement of TF  MONITOR:   Vent status, Labs, TF tolerance, I & O's  REASON FOR ASSESSMENT:   Consult Enteral/tube feeding initiation and management  ASSESSMENT:   77 yo female admitted with mixed shock, septic and hypovolemic, high grade SBO with bowel ischemia. PMH includes COPD, GERD, nephrolithiasis, HTN, OSA, lung cancer (adenocarcinoma RLL dx 05/2023)  3/04 OR: Ex Lap, SB resection, Abthera wound VAC placement, open abdomen with bowel in discontinuity  3/05 PICC line placed 3/06 Return to OR: Re-opening of recent ex lap, SB resection with end ileostomy, abd wall closure and wound VAC remove 3/07 TPN initiated, Extubated, Re-Intubated 3/10 Extubated  3/11 off BiPAP 3/13 re-intubated, transferred to 4N ICU 3/14 Cortrak placed, currently gastric (per x-ray 3/15, Cortrak tube likely in third portion of duodenum) 3/15 Started trickle TF 3/18 TF to goal; wean TPN 3/21 bedside tracheostomy 3/25 transferred to  , vent support 3/26 Trach collar (30 minutes) trial  Followed up with pt today. Pt lethargic and at time of assessment and resting. Spoke with RN who reports no change of status. Administered fiber and imodium to help ileostomy output, but output continues to be high. SLP continuing to follow and will re-evaluate once pt transitions to trach collar. Surgery continues to follow ostomy output and the goal is for output to be 874ml-1L, which will require fiber, imodium, and lotomotil administration. Pt's hypernatremia continues, prescribed free water flushes every 2 hours to help bring down sodium levels. Will continue to follow ostomy output and plan for treatment when output within appropriate range.  Weight loss since admission concerning. Pt's weight began to decline after TPN was stopped and EN initiated in combination with high ostomy output. Pt came in with fluid and weighed 129#, but pt reported typical weight and last known wt without fluid as 120#. Pt now weighs 103# which indicates 14% loss over 22 day hospital stay. Pt's wounds have also progressed to stage II pressure wounds, indicating calorie and protein needs are not being met even with pt at goal rate for 9 days. Reached out to MD and pharmacist about restarting TPN.  Patient is currently intubated on ventilator support MV: 11.9 L/min Temp (24hrs), Avg:98.4 F (36.9 C), Min:97.6 F (36.4 C), Max:99.1 F (37.3 C)  Admit weight: 54.4kg  Current weight: 47 kg  Intake/Output Summary (Last 24 hours) at 12/03/2023 1558 Last data filed at 12/03/2023 1524 Gross per 24 hour  Intake 3150 ml  Output 3470 ml  Net -320 ml  Net IO Since Admission: 1,305.99 mL [12/03/23 1558]  Drains/Lines: Cortrak Tracheostomy Ileostomy: output 2L Urethral Catheter: output  Nutritionally Relevant Medications: Scheduled Meds:  diphenoxylate-atropine  1 tablet Per Tube QID   feeding supplement (PROSource TF20)  60 mL Per Tube Daily    fiber supplement (BANATROL TF)  120 mL Per Tube QID   free water  300 mL Per Tube Q2H   loperamide HCl  8 mg Per Tube QID   nutrition supplement (JUVEN)  1 packet Per Tube BID BM   pantoprazole (PROTONIX) IV  40 mg Intravenous Q24H    Continuous Infusions:  feeding supplement (VITAL 1.5 CAL) 50 mL/hr at 12/03/23 1500   PRN Meds:.alum & mag hydroxide-simeth, ondansetron (ZOFRAN) IV,  polyethylene glycol, senna  Labs Reviewed: Sodium 149<--155 Hgb 7.8 CBG ranges from 86-107 mg/dL over the last 24 hours  Diet Order:   Diet Order             Diet NPO time specified  Diet effective now                  EDUCATION NEEDS:   Not appropriate for education at this time  Skin:  Skin Assessment: Skin Integrity Issues: Skin Integrity Issues:: Stage II, DTI, Incisions DTI: coccyx Stage II: L heel, R heel Wound Vac: removed Incisions: abd Other: skin tear: R and L arm  Last BM:  2060 ml via ileostomy x 24 hours  Height:  Ht Readings from Last 1 Encounters:  11/20/23 5' 7.01" (1.702 m)   Weight:  Wt Readings from Last 1 Encounters:  12/02/23 47 kg   Ideal Body Weight:  61.4 kg  BMI:  Body mass index is 16.22 kg/m.  Estimated Nutritional Needs:  Kcal:  1850-2050  Protein:  85-105g  Fluid:  >/= 2L  Louis Meckel Dietetic Intern

## 2023-12-04 DIAGNOSIS — Z930 Tracheostomy status: Secondary | ICD-10-CM | POA: Diagnosis not present

## 2023-12-04 DIAGNOSIS — J9601 Acute respiratory failure with hypoxia: Secondary | ICD-10-CM | POA: Diagnosis not present

## 2023-12-04 DIAGNOSIS — J449 Chronic obstructive pulmonary disease, unspecified: Secondary | ICD-10-CM | POA: Diagnosis not present

## 2023-12-04 LAB — CBC
HCT: 23.7 % — ABNORMAL LOW (ref 36.0–46.0)
Hemoglobin: 7.1 g/dL — ABNORMAL LOW (ref 12.0–15.0)
MCH: 29.2 pg (ref 26.0–34.0)
MCHC: 30 g/dL (ref 30.0–36.0)
MCV: 97.5 fL (ref 80.0–100.0)
Platelets: 374 10*3/uL (ref 150–400)
RBC: 2.43 MIL/uL — ABNORMAL LOW (ref 3.87–5.11)
RDW: 16.8 % — ABNORMAL HIGH (ref 11.5–15.5)
WBC: 14.3 10*3/uL — ABNORMAL HIGH (ref 4.0–10.5)
nRBC: 0.1 % (ref 0.0–0.2)

## 2023-12-04 LAB — BASIC METABOLIC PANEL WITH GFR
Anion gap: 7 (ref 5–15)
BUN: 32 mg/dL — ABNORMAL HIGH (ref 8–23)
CO2: 30 mmol/L (ref 22–32)
Calcium: 9 mg/dL (ref 8.9–10.3)
Chloride: 103 mmol/L (ref 98–111)
Creatinine, Ser: 0.61 mg/dL (ref 0.44–1.00)
GFR, Estimated: >60 mL/min (ref >60)
Glucose, Bld: 118 mg/dL — ABNORMAL HIGH (ref 70–99)
Potassium: 4.2 mmol/L (ref 3.5–5.1)
Sodium: 140 mmol/L (ref 135–145)

## 2023-12-04 LAB — PHOSPHORUS: Phosphorus: 2.6 mg/dL (ref 2.5–4.6)

## 2023-12-04 LAB — GLUCOSE, CAPILLARY
Glucose-Capillary: 107 mg/dL — ABNORMAL HIGH (ref 70–99)
Glucose-Capillary: 110 mg/dL — ABNORMAL HIGH (ref 70–99)
Glucose-Capillary: 112 mg/dL — ABNORMAL HIGH (ref 70–99)

## 2023-12-04 LAB — MAGNESIUM: Magnesium: 1.6 mg/dL — ABNORMAL LOW (ref 1.7–2.4)

## 2023-12-04 MED ORDER — LOPERAMIDE HCL 1 MG/7.5ML PO SUSP
8.0000 mg | Freq: Four times a day (QID) | ORAL | Status: DC
  Administered 2023-12-04 – 2023-12-05 (×4): 8 mg
  Filled 2023-12-04 (×7): qty 60

## 2023-12-04 MED ORDER — FREE WATER
300.0000 mL | Status: DC
  Administered 2023-12-04 – 2023-12-06 (×14): 300 mL

## 2023-12-04 MED ORDER — MEDIHONEY WOUND/BURN DRESSING EX PSTE
1.0000 | PASTE | Freq: Every day | CUTANEOUS | Status: DC
  Administered 2023-12-04 – 2023-12-11 (×8): 1 via TOPICAL
  Filled 2023-12-04: qty 44

## 2023-12-04 MED ORDER — LOPERAMIDE HCL 1 MG/7.5ML PO SUSP
4.0000 mg | Freq: Four times a day (QID) | ORAL | Status: DC
  Administered 2023-12-04: 4 mg
  Filled 2023-12-04 (×2): qty 30

## 2023-12-04 MED ORDER — FERROUS SULFATE 220 (44 FE) MG/5ML PO SOLN
300.0000 mg | Freq: Every day | ORAL | Status: DC
  Filled 2023-12-04: qty 6.9

## 2023-12-04 MED ORDER — VIVONEX RTF PO LIQD
1000.0000 mL | ORAL | Status: DC
  Administered 2023-12-04 – 2023-12-05 (×3): 1000 mL via ORAL
  Filled 2023-12-04 (×4): qty 1000

## 2023-12-04 MED ORDER — FERROUS SULFATE 300 (60 FE) MG/5ML PO SOLN
300.0000 mg | Freq: Every day | ORAL | Status: DC
  Administered 2023-12-04 – 2023-12-11 (×8): 300 mg
  Filled 2023-12-04 (×8): qty 5

## 2023-12-04 MED ORDER — FERROUS SULFATE 300 (60 FE) MG/5ML PO SOLN
300.0000 mg | Freq: Every day | ORAL | Status: DC
  Filled 2023-12-04 (×2): qty 5

## 2023-12-04 MED ORDER — NON FORMULARY: 1000.0000 mL | Status: DC

## 2023-12-04 MED ORDER — BANATROL TF EN LIQD
120.0000 mL | Freq: Every day | ENTERAL | Status: DC
  Administered 2023-12-04 – 2023-12-09 (×31): 120 mL
  Filled 2023-12-04 (×30): qty 120

## 2023-12-04 MED ORDER — DIPHENOXYLATE-ATROPINE 2.5-0.025 MG PO TABS
2.0000 | ORAL_TABLET | Freq: Four times a day (QID) | ORAL | Status: DC
  Administered 2023-12-04 – 2023-12-11 (×29): 2
  Filled 2023-12-04 (×33): qty 2

## 2023-12-04 MED ORDER — MAGNESIUM SULFATE 2 GM/50ML IV SOLN
2.0000 g | Freq: Once | INTRAVENOUS | Status: AC
  Administered 2023-12-04: 2 g via INTRAVENOUS
  Filled 2023-12-04: qty 50

## 2023-12-04 MED ORDER — FERROUS SULFATE 220 (44 FE) MG/5ML PO SOLN
220.0000 mg | Freq: Every day | ORAL | Status: DC
  Filled 2023-12-04: qty 5

## 2023-12-04 NOTE — Progress Notes (Signed)
 Brief Nutrition Support Note  Discussed with care team about changing enteral feeding due to high output and weight loss. Plan to try elemental formula and monitor impact on output.  New recommendations below:  Initiate tube feeding via Cortrak: Vivonex at 75 ml/h (1800 ml per day)  Provides 1800 kcal, 90 gm protein, 1526 ml free water daily  Louis Meckel Dietetic Intern

## 2023-12-04 NOTE — Progress Notes (Signed)
 Patient ID: Jenny White, female   DOB: 1945/10/31, 78 y.o.   MRN: [id number] 21 Days Post-Op    Subjective: No new complaints. No n/v  Objective: Vital signs in last 24 hours: Temp:  [98.4 F (36.9 C)-98.9 F (37.2 C)] 98.5 F (36.9 C) (03/27 0359) Pulse Rate:  [66-108] 68 (03/27 0600) Resp:  [21-31] 25 (03/27 0600) BP: (90-175)/(41-92) 105/54 (03/27 0600) SpO2:  [94 %-100 %] 94 % (03/27 0600) FiO2 (%):  [35 %-40 %] 40 % (03/27 0940) Weight:  [47.2 kg] 47.2 kg (03/27 0500) Last BM Date : 12/04/23  Intake/Output from previous day: 03/26 0701 - 03/27 0700 In: 4450 [NG/GT:4450] Out: 3325 [Urine:1580; Stool:1745] Intake/Output this shift: Total I/O In: 20 [I.V.:20] Out: 575 [Urine:325; Stool:250]  GI: soft, NT, incision intact with small amount of eschar. No erythema or discharge, a lot of liquid stool in bag. Stoma pink and viable  Ileostomy: 1752mL/24h Lab Results: CBC  Recent Labs    12/03/23 0346 12/04/23 0416  WBC 12.4* 14.3*  HGB 7.8* 7.1*  HCT 25.3* 23.7*  PLT 394 374   BMET Recent Labs    12/03/23 0346 12/04/23 0416  NA 149* 140  K 3.9 4.2  CL 109 103  CO2 32 30  GLUCOSE 106* 118*  BUN 37* 32*  CREATININE 0.62 0.61  CALCIUM 9.5 9.0   PT/INR No results for input(s): "LABPROT", "INR" in the last 72 hours. ABG No results for input(s): "PHART", "HCO3" in the last 72 hours.  Invalid input(s): "PCO2", "PO2"  Studies/Results: DG CHEST PORT 1 VIEW Result Date: 12/03/2023 CLINICAL DATA:  Shortness of breath EXAM: PORTABLE CHEST 1 VIEW COMPARISON:  11/28/2023 FINDINGS: Tracheostomy remains in place, unchanged. Heart and mediastinal contours within normal limits. Aortic atherosclerosis. Small left pleural effusion with left basilar airspace opacity again noted, slightly improved. No confluent opacity on the right. No acute bony abnormality. IMPRESSION: Continued small left pleural effusion with left lower lobe atelectasis or infiltrate, slightly improved since  prior study. Electronically Signed   By: Charlett Nose M.D.   On: 12/03/2023 11:17     Anti-infectives: Anti-infectives (From admission, onward)    Start     Dose/Rate Route Frequency Ordered Stop   11/25/23 1145  meropenem (MERREM) 1 g in sodium chloride 0.9 % 100 mL IVPB        1 g 200 mL/hr over 30 Minutes Intravenous Every 12 hours 11/25/23 1045 12/01/23 2100   11/20/23 1045  tobramycin (PF) (TOBI) nebulizer solution 300 mg  Status:  Discontinued        300 mg Nebulization 2 times daily 11/20/23 0952 11/23/23 1133   11/17/23 1000  meropenem (MERREM) 1 g in sodium chloride 0.9 % 100 mL IVPB  Status:  Discontinued        1 g 200 mL/hr over 30 Minutes Intravenous Every 12 hours 11/17/23 0832 11/23/23 1133   11/15/23 1400  piperacillin-tazobactam (ZOSYN) IVPB 3.375 g  Status:  Discontinued        3.375 g 100 mL/hr over 30 Minutes Intravenous Every 8 hours 11/15/23 1223 11/17/23 0832   11/11/23 2000  piperacillin-tazobactam (ZOSYN) IVPB 3.375 g  Status:  Discontinued        3.375 g 12.5 mL/hr over 240 Minutes Intravenous Every 8 hours 11/11/23 1233 11/15/23 1223   11/11/23 1245  piperacillin-tazobactam (ZOSYN) IVPB 3.375 g        3.375 g 100 mL/hr over 30 Minutes Intravenous  Once 11/11/23 1233 11/11/23 1316  Assessment/Plan: POD 23/21 S/P ex-lap small bowel resection, left open; ex-lap, small bowel resection, ileostomy and closure, Dr. Dwain Sarna for SBO - ileostomy output 1745 ml/24h - Continue fiber - increase again today to 6 times daily. increased imodium to 8mg  QID 3/26, continue. Added lomotil 3/25 - inc today. Add iron. Discussed with pharm and appreciate their assistance - discussed with dietary possible switch to elemental Tfs to also help with output - per WOC RN abnormality of stoma noted during pouch change this am - stoma not able to be completely evaluated today with one piece in place and changed just this am. However stoma is pink, viable, and functional. Will  check stoma next pouch change - Continue tele - Staples out - Remainder of care per CCM. Surgery will continue to follow.  FEN: post pyloric Tfs (continue post pyloric due to aspiration risk) ID: none VTE: eliquis bid   LOS: 23 days   Eric Form, [facility] 12/04/2023, 10:19 AM Please see Amion for pager number during day hours 7:00am-4:30pm   12/04/2023

## 2023-12-04 NOTE — Progress Notes (Signed)
   12/04/23 0858  Oxygen Therapy/Pulse Ox  O2 Device (S)  Tracheostomy Collar  O2 Therapy Oxygen humidified  O2 Flow Rate (L/min) 10 L/min  FiO2 (%) 35 %    Pt was placed on trach collar for 30 minutes per MD order. Pt is tolerating well at this time.

## 2023-12-04 NOTE — Consult Note (Addendum)
 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 They are superficial, no mucous detachment. No signs of inflammatory process. Peristomal assessment: some maceration, not bag leaking.  Treatment options for stomal/peristomal skin:  2" ring # [id number]  Output 150 ml on the bag at 0900. Brown, liquid. Ostomy pouching: 1pc. [id number] and ring [id number] Education provided: Change bag MON and THURS. Clean the skin with saline, pat dry. Cut the barrier as the size and shape as the ostomy (40 mm x 30 mm) Remove the plastic protection, Apply the ring surrounding the cut on the barrier, apply on the skin. Remove the tape protection. Close the lock in roll system on the ostomy bag. Empty when is 1/3 full.  Enrolled patient in [employer] DC program: No (pt requires further teaching and the WOC team will follow the progressing of the fistulas to enroll)  WOC Nurse wound follow up Wound type: HAPI on Sacrum Measurement: 3.5 x 3.5 cm Wound bed: 100% gray (eschar) Drainage (amount, consistency, odor) Scant amount. Periwound: Dressing procedure/placement/frequency: Cleanse sacrum/buttocks/coccyx with Vashe wound cleanser [facility] [place] [phone])  Apply Medihoney daily on the wound bed. Cover with foam dressing, change every 3 days or PRN.  Please reconsult if further assistance is needed. Thank-you,  Denyse Amass BSN, RN, [employer], WOC  (Pager: [phone])

## 2023-12-04 NOTE — Progress Notes (Signed)
   12/04/23 0940  Oxygen Therapy/Pulse Ox  O2 Device (S)  Ventilator  O2 Therapy Oxygen  FiO2 (%) 40 %     Pt was placed back on ventilator per MD order. Pt is anxious, but tolerating well at this time.

## 2023-12-04 NOTE — Progress Notes (Signed)
   12/04/23 1545  Oxygen Therapy/Pulse Ox  O2 Device (S)  Ventilator  O2 Therapy Oxygen  FiO2 (%) 40 %    Placed back on ventilator per MD order.
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
## 2023-12-04 NOTE — Progress Notes (Signed)
 Occupational Therapy Treatment Patient Details Name: Jenny White MRN: [id number] DOB: [birth year] Today's Date: 12/04/2023   History of present illness 78 yo female presenting with n/v on 3/4. CT(+) SBO. 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 Trach placed 3/21. PMH COPD, GERD, nephrolithiasis, HTN, OSA, lung CA (05/2023) s/p radiation, arthritis, spinal stenosis L3-4 fusion.   OT comments  Pt making fair progress towards goals though remains limited by endurance, strength and balance deficits. With use of bed egress function, guided pt in sitting balance/postural correction activities and brief standing attempts at bedside with Max A x 2. Pt fatigued by these tasks and returned back to chair position w/ PA at bedside. Based on medical complexities and continued need to work on vent weaning, recommend continued OT in [facility] setting.       If plan is discharge home, recommend the following:  Two people to help with walking and/or transfers;A lot of help with bathing/dressing/bathroom;Assistance with cooking/housework;Assistance with feeding;Direct supervision/assist for medications management;Direct supervision/assist for financial management;Assist for transportation;Help with stairs or ramp for entrance;Supervision due to cognitive status   Equipment Recommendations  Other (comment) (TBD pending progress)    Recommendations for Other Services      Precautions / Restrictions Precautions Precautions: Fall;Other (comment) Recall of Precautions/Restrictions: Impaired Precaution/Restrictions Comments: ostomy, trach, midline abd incision, cortrak Restrictions Weight Bearing Restrictions Per Provider Order: No       Mobility Bed Mobility Overal bed mobility: Needs Assistance             General bed mobility comments: Use of bed egress function to sitting at foot of bed. Total A x 2 to scoot back up in bed once supine.     Transfers Overall transfer level: Needs assistance Equipment used: 2 person hand held assist Transfers: Sit to/from Stand Sit to Stand: Max assist, +2 safety/equipment, +2 physical assistance           General transfer comment: Max A x 2 for brief standing at bedside, unable to achieve full posture and fatigued from earlier tasks     Balance Overall balance assessment: Needs assistance Sitting-balance support: Feet supported, Bilateral upper extremity supported Sitting balance-Leahy Scale: Poor   Postural control: Posterior lean Standing balance support: Bilateral upper extremity supported Standing balance-Leahy Scale: Zero                             ADL either performed or assessed with clinical judgement   ADL Overall ADL's : Needs assistance/impaired                                       General ADL Comments: Emphasis on sitting balance/posture with bed egress function, brief standing. cues for breathing, rest breaks and anxiety strategies needed    Extremity/Trunk Assessment Upper Extremity Assessment Upper Extremity Assessment: Generalized weakness;Right hand dominant   Lower Extremity Assessment Lower Extremity Assessment: Defer to PT evaluation        Vision   Vision Assessment?: No apparent visual deficits   Perception     Praxis     Communication Communication Communication: Impaired Factors Affecting Communication: Trach/intubated   Cognition Arousal: Alert Behavior During Therapy: WFL for tasks assessed/performed Cognition: Difficult to assess Difficult to assess due to: Tracheostomy           OT -  Cognition Comments: Pt used head gestures to answer "yes" "no" questions. Pt was able to mouth or point to wants/needs. appears WFL- does become anxious with activity/WOB                 Following commands: Intact        Cueing   Cueing Techniques: Verbal cues, Gestural cues, Tactile cues  Exercises       Shoulder Instructions       General Comments Son present during session.    Pertinent Vitals/ Pain       Pain Assessment Pain Assessment: Faces Faces Pain Scale: No hurt  Home Living                                          Prior Functioning/Environment              Frequency  Min 1X/week        Progress Toward Goals  OT Goals(current goals can now be found in the care plan section)  Progress towards OT goals: Progressing toward goals  Acute Rehab OT Goals Patient Stated Goal: son hopeful for pt to go to [facility] and wean from vent OT Goal Formulation: With patient/family Time For Goal Achievement: 12/15/23 Potential to Achieve Goals: Good ADL Goals Pt Will Perform Grooming: with supervision;sitting Pt Will Perform Upper Body Dressing: with min assist;sitting Pt Will Perform Lower Body Dressing: with mod assist;sit to/from stand;sitting/lateral leans Pt Will Transfer to Toilet: with mod assist;stand pivot transfer;bedside commode Pt Will Perform Toileting - Clothing Manipulation and hygiene: with mod assist;sitting/lateral leans;sit to/from stand Additional ADL Goal #1: Pt will demonstrate improved activity tolerance by completing functional task of choice for 10 minutes while seated EOB without visible signs of fatigue Additional ADL Goal #2: Pt will complete bed mobility tasks with MOD A as a precursor to OOB ADLs  Plan      Co-evaluation                 AM-PAC OT "6 Clicks" Daily Activity     Outcome Measure   Help from another person eating meals?: Total Help from another person taking care of personal grooming?: A Little Help from another person toileting, which includes using toliet, bedpan, or urinal?: Total Help from another person bathing (including washing, rinsing, drying)?: A Lot Help from another person to put on and taking off regular upper body clothing?: A Lot Help from another person to put on and taking off regular  lower body clothing?: Total 6 Click Score: 10    End of Session Equipment Utilized During Treatment: Other (comment) (vent)  OT Visit Diagnosis: Unsteadiness on feet (R26.81);Other abnormalities of gait and mobility (R26.89);Muscle weakness (generalized) (M62.81);History of falling (Z91.81)   Activity Tolerance Patient tolerated treatment well;Patient limited by fatigue   Patient Left in bed;with call bell/phone within reach;with family/visitor present;Other (comment) (PA at bedside to assess ostomy)   Nurse Communication Mobility status        Time: 0347-4259 OT Time Calculation (min): 21 min  Charges: OT General Charges $OT Visit: 1 Visit OT Treatments $Therapeutic Activity: 8-22 mins  Bradd Canary, OTR/L Acute Rehab Services Office: [phone]   Lorre Munroe 12/04/2023, 12:20 PM

## 2023-12-04 NOTE — Progress Notes (Signed)
   12/04/23 1459  Oxygen Therapy/Pulse Ox  O2 Device (S)  Tracheostomy Collar  O2 Therapy Oxygen humidified  O2 Flow Rate (L/min) 10 L/min  FiO2 (%) 35 %    Pt was placed on trach collar per MD order.

## 2023-12-05 DIAGNOSIS — J9621 Acute and chronic respiratory failure with hypoxia: Secondary | ICD-10-CM

## 2023-12-05 DIAGNOSIS — L8990 Pressure ulcer of unspecified site, unspecified stage: Secondary | ICD-10-CM | POA: Insufficient documentation

## 2023-12-05 DIAGNOSIS — Z930 Tracheostomy status: Secondary | ICD-10-CM | POA: Diagnosis not present

## 2023-12-05 DIAGNOSIS — J9620 Acute and chronic respiratory failure, unspecified whether with hypoxia or hypercapnia: Secondary | ICD-10-CM | POA: Diagnosis not present

## 2023-12-05 DIAGNOSIS — I4891 Unspecified atrial fibrillation: Secondary | ICD-10-CM | POA: Diagnosis not present

## 2023-12-05 DIAGNOSIS — E870 Hyperosmolality and hypernatremia: Secondary | ICD-10-CM | POA: Diagnosis not present

## 2023-12-05 LAB — RENAL FUNCTION PANEL
Albumin: 2.2 g/dL — ABNORMAL LOW (ref 3.5–5.0)
Anion gap: 8 (ref 5–15)
BUN: 30 mg/dL — ABNORMAL HIGH (ref 8–23)
CO2: 28 mmol/L (ref 22–32)
Calcium: 8.8 mg/dL — ABNORMAL LOW (ref 8.9–10.3)
Chloride: 103 mmol/L (ref 98–111)
Creatinine, Ser: 0.51 mg/dL (ref 0.44–1.00)
GFR, Estimated: >60 mL/min (ref >60)
Glucose, Bld: 112 mg/dL — ABNORMAL HIGH (ref 70–99)
Phosphorus: 3.4 mg/dL (ref 2.5–4.6)
Potassium: 3.7 mmol/L (ref 3.5–5.1)
Sodium: 139 mmol/L (ref 135–145)

## 2023-12-05 LAB — CBC
HCT: 24.8 % — ABNORMAL LOW (ref 36.0–46.0)
Hemoglobin: 7.6 g/dL — ABNORMAL LOW (ref 12.0–15.0)
MCH: 29.6 pg (ref 26.0–34.0)
MCHC: 30.6 g/dL (ref 30.0–36.0)
MCV: 96.5 fL (ref 80.0–100.0)
Platelets: 416 10*3/uL — ABNORMAL HIGH (ref 150–400)
RBC: 2.57 MIL/uL — ABNORMAL LOW (ref 3.87–5.11)
RDW: 16.4 % — ABNORMAL HIGH (ref 11.5–15.5)
WBC: 15 10*3/uL — ABNORMAL HIGH (ref 4.0–10.5)
nRBC: 0.1 % (ref 0.0–0.2)

## 2023-12-05 LAB — GLUCOSE, CAPILLARY
Glucose-Capillary: 104 mg/dL — ABNORMAL HIGH (ref 70–99)
Glucose-Capillary: 109 mg/dL — ABNORMAL HIGH (ref 70–99)
Glucose-Capillary: 112 mg/dL — ABNORMAL HIGH (ref 70–99)
Glucose-Capillary: 121 mg/dL — ABNORMAL HIGH (ref 70–99)
Glucose-Capillary: 98 mg/dL (ref 70–99)

## 2023-12-05 LAB — MAGNESIUM: Magnesium: 2.1 mg/dL (ref 1.7–2.4)

## 2023-12-05 LAB — PREALBUMIN: Prealbumin: 19 mg/dL (ref 18–38)

## 2023-12-05 MED ORDER — HYDROMORPHONE HCL 1 MG/ML IJ SOLN
0.5000 mg | Freq: Once | INTRAMUSCULAR | Status: AC
  Administered 2023-12-05: 0.5 mg via INTRAVENOUS
  Filled 2023-12-05: qty 0.5

## 2023-12-05 MED ORDER — SIMETHICONE 40 MG/0.6ML PO SUSP
80.0000 mg | Freq: Four times a day (QID) | ORAL | Status: DC | PRN
  Filled 2023-12-05: qty 1.2

## 2023-12-05 MED ORDER — ALPRAZOLAM 0.5 MG PO TABS
0.2500 mg | ORAL_TABLET | Freq: Two times a day (BID) | ORAL | Status: AC | PRN
  Administered 2023-12-06 – 2023-12-07 (×3): 0.25 mg
  Filled 2023-12-05 (×3): qty 1

## 2023-12-05 MED ORDER — ALPRAZOLAM 0.5 MG PO TABS
0.2500 mg | ORAL_TABLET | Freq: Two times a day (BID) | ORAL | Status: DC | PRN
  Administered 2023-12-05: 0.25 mg via ORAL
  Filled 2023-12-05: qty 1

## 2023-12-05 MED ORDER — ALUM & MAG HYDROXIDE-SIMETH 200-200-20 MG/5ML PO SUSP: 15.0000 mL | ORAL | Status: DC | PRN

## 2023-12-05 MED ORDER — LOPERAMIDE HCL 1 MG/7.5ML PO SUSP
6.0000 mg | Freq: Four times a day (QID) | ORAL | Status: DC
  Administered 2023-12-05 – 2023-12-07 (×11): 6 mg
  Filled 2023-12-05 (×13): qty 45

## 2023-12-05 MED ORDER — LACTATED RINGERS IV BOLUS
1000.0000 mL | Freq: Once | INTRAVENOUS | Status: AC
Start: 2023-12-05 — End: 2023-12-05
  Administered 2023-12-05: 1000 mL via INTRAVENOUS

## 2023-12-05 MED ORDER — SIMETHICONE 40 MG/0.6ML PO SUSP
80.0000 mg | Freq: Once | ORAL | Status: AC
  Administered 2023-12-05: 80 mg
  Filled 2023-12-05: qty 1.2

## 2023-12-05 NOTE — Progress Notes (Signed)
 Trach sutures were removed per protocol. No complications with removal.

## 2023-12-05 NOTE — Progress Notes (Signed)
 Patient ID: Jenny White, female   DOB: 04-Jan-1946, 78 y.o.   MRN: [id number] 22 Days Post-Op    Subjective: Resting.  Output down to 1250cc.  Denies abdominal pain  Objective: Vital signs in last 24 hours: Temp:  [97.5 F (36.4 C)-97.9 F (36.6 C)] 97.6 F (36.4 C) (03/28 0400) Pulse Rate:  [65-96] 66 (03/28 0700) Resp:  [13-30] 20 (03/28 0700) BP: (94-125)/(39-61) 115/50 (03/28 0735) SpO2:  [89 %-100 %] 100 % (03/28 0700) FiO2 (%):  [35 %-40 %] 40 % (03/28 0735) Weight:  [44.9 kg] 44.9 kg (03/28 0500) Last BM Date : 12/04/23  Intake/Output from previous day: 03/27 0701 - 03/28 0700 In: 2764.6 [P.O.:100; I.V.:20; NG/GT:2644.6] Out: 2425 [Urine:1175; Stool:1250] Intake/Output this shift: No intake/output data recorded.  GI: soft, NT, incision intact with small amount of eschar, No erythema. Stoma pink and viable.  Some mix of more solid type stool today with liquid stool.  Possible" fistualae" noted by WOC yesterday unable to be assessed due to 1 piece ostomy appliance.   Ileostomy: 1283mL/24h Lab Results: CBC  Recent Labs    12/04/23 0416 12/05/23 0508  WBC 14.3* 15.0*  HGB 7.1* 7.6*  HCT 23.7* 24.8*  PLT 374 416*   BMET Recent Labs    12/04/23 0416 12/05/23 0508  NA 140 139  K 4.2 3.7  CL 103 103  CO2 30 28  GLUCOSE 118* 112*  BUN 32* 30*  CREATININE 0.61 0.51  CALCIUM 9.0 8.8*   PT/INR No results for input(s): "LABPROT", "INR" in the last 72 hours. ABG No results for input(s): "PHART", "HCO3" in the last 72 hours.  Invalid input(s): "PCO2", "PO2"  Studies/Results: No results found.    Anti-infectives: Anti-infectives (From admission, onward)    Start     Dose/Rate Route Frequency Ordered Stop   11/25/23 1145  meropenem (MERREM) 1 g in sodium chloride 0.9 % 100 mL IVPB        1 g 200 mL/hr over 30 Minutes Intravenous Every 12 hours 11/25/23 1045 12/01/23 2100   11/20/23 1045  tobramycin (PF) (TOBI) nebulizer solution 300 mg  Status:  Discontinued         300 mg Nebulization 2 times daily 11/20/23 0952 11/23/23 1133   11/17/23 1000  meropenem (MERREM) 1 g in sodium chloride 0.9 % 100 mL IVPB  Status:  Discontinued        1 g 200 mL/hr over 30 Minutes Intravenous Every 12 hours 11/17/23 0832 11/23/23 1133   11/15/23 1400  piperacillin-tazobactam (ZOSYN) IVPB 3.375 g  Status:  Discontinued        3.375 g 100 mL/hr over 30 Minutes Intravenous Every 8 hours 11/15/23 1223 11/17/23 0832   11/11/23 2000  piperacillin-tazobactam (ZOSYN) IVPB 3.375 g  Status:  Discontinued        3.375 g 12.5 mL/hr over 240 Minutes Intravenous Every 8 hours 11/11/23 1233 11/15/23 1223   11/11/23 1245  piperacillin-tazobactam (ZOSYN) IVPB 3.375 g        3.375 g 100 mL/hr over 30 Minutes Intravenous  Once 11/11/23 1233 11/11/23 1316       Assessment/Plan: POD 24/22 S/P ex-lap small bowel resection, left open; ex-lap, small bowel resection, ileostomy and closure, Dr. Dwain Sarna for SBO - ileostomy output 1250 ml/24h - Continue fiber - 6 times daily. imodium to 8mg  QID 3/26, continue. Added lomotil 3/25 - inc yesterday. Added iron 3/27. Given output is downtrending, will not adjust meds today to give a chance to stabilize  the additions and changes we have made. - continue TFs.  Prealbumin 19.  No needs for TNA at this time to supplement per dietitians concerns yesterday. - per WOC RN abnormality of stoma noted during pouch change this am - stoma not able to be completely evaluated with one piece in place. However stoma is pink, viable, and functional. Will check stoma next pouch change, but even if she has mucosal hyperplasia or fistula, nothing to do about this currently. - Remainder of care per CCM. Surgery will continue to follow.  FEN: post pyloric Tfs (continue post pyloric due to aspiration risk) ID: none VTE: eliquis bid   LOS: 24 days   Letha Cape, [facility] Surgery 12/05/2023, 8:02 AM Please see Amion for pager number during day  hours 7:00am-4:30pm   12/05/2023
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
## 2023-12-05 NOTE — Progress Notes (Signed)
 eLink Physician-Brief Progress Note Patient Name: Jenny White DOB: 1946-04-06 MRN: [id number]   Date of Service  12/05/2023  HPI/Events of Note  Patient needs a PRN anxiolytic ordered.  eICU Interventions  PRN Xanax ordered.        Migdalia Dk 12/05/2023, 6:44 AM

## 2023-12-05 NOTE — Progress Notes (Signed)
 Physical Therapy Treatment Patient Details Name: Jenny White MRN: [id number] DOB: 05/28/1946 Today's Date: 12/05/2023   History of Present Illness 78 yo female presenting with n/v on 3/4. CT(+) SBO. 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 Trach placed 3/21. 3/25 Transfer to ICU and placed on Vent. PMH COPD, GERD, nephrolithiasis, HTN, OSA, lung CA (05/2023) s/p radiation, arthritis, spinal stenosis L3-4 fusion.    PT Comments  Pt received in supine ~20 mins after RN premedicated her for pain, pt agreeable to participate in PT session with encouragement, pt gesturing to abdomen due to pain and appears anxious but with good participation once agreeable.  Pt needing up to +2 maxA to perform sit<>stand from bed chair egress posture and with poor progressing to fair seated tolerance at EOB with increased activity. Pt guarding at abdomen and RR mid to high 30's rpm with seated/standing activity. Frequent rest breaks sitting upright between transfer and seated exercise activities.   Trach ventilator 40% FiO2 PEEP 5 (PRVC supported) SpO2 96-100% with activity  Resting HR 78 bpm and WFL (~90 bpm) with seated/standing activity. BP 148/69 RR 34 rpm taken on LUE while in chair posture (cuff switched after this reading taken)    If plan is discharge home, recommend the following: Two people to help with walking and/or transfers;Two people to help with bathing/dressing/bathroom   Can travel by private vehicle     No  Equipment Recommendations  BSC/3in1;Wheelchair cushion (measurements PT);Wheelchair (measurements PT);Hoyer lift;Hospital bed    Recommendations for Other Services       Precautions / Restrictions Precautions Precautions: Fall;Other (comment) Recall of Precautions/Restrictions: Impaired Precaution/Restrictions Comments: ostomy, trach, midline abd incision, cortrak Restrictions Weight Bearing Restrictions Per Provider Order:  No     Mobility  Bed Mobility Overal bed mobility: Needs Assistance Bed Mobility: Supine to Sit, Sit to Supine, Rolling Rolling: Min assist, Used rails   Supine to sit: Used rails, HOB elevated Sit to supine: Used rails, HOB elevated   General bed mobility comments: Use of bed egress function to sitting at foot of bed. Total A +2 for posterior supine scoot toward HOB at end of session. Rolling to L/R with verbal/tactile cues to utilize legs and cross-body reaching with minA to each side.    Transfers Overall transfer level: Needs assistance Equipment used: 2 person hand held assist Transfers: Sit to/from Stand Sit to Stand: Max assist, +2 safety/equipment, +2 physical assistance, From elevated surface           General transfer comment: Max A +2 for sit<>stand from  bed egress posture with HHA on forearms and bil feet blocked for safety, pt standing nearly full upright on each attempt, tolerates <20 seconds total on attempts prior to requesting return to sitting. Support from bed pad under hips for lift assist and defer gait belt due to amount of lines/tubes and pt pain from abdominal incisions.    Ambulation/Gait             Pre-gait activities: pt not yet able to perform standing hip flexion or weight shifting, worked on hip/knee extension standing today.     Stairs             Wheelchair Mobility     Tilt Bed    Modified Rankin (Stroke Patients Only)       Balance Overall balance assessment: Needs assistance Sitting-balance support: Feet supported, Bilateral upper extremity supported, No upper extremity supported Sitting balance-Leahy Scale: Poor  Sitting balance - Comments: initially poor and reliant on BUE support but after first sit<>stand trial, seated balance improved to fair Postural control: Posterior lean (Tendency to seek back support sitting in bed chair egress posture) Standing balance support: Bilateral upper extremity supported Standing  balance-Leahy Scale: Zero Standing balance comment: +2 max assist with HHA and bed pad assist each trial                            Communication Communication Communication: Impaired Factors Affecting Communication: Trach/intubated  Cognition Arousal: Alert Behavior During Therapy: WFL for tasks assessed/performed, Anxious   PT - Cognitive impairments: Difficult to assess Difficult to assess due to: Tracheostomy (on vent)                     PT - Cognition Comments: mouthing responses or nodding/shaking head appropriately; anxious regarding prospect of mobility/increased pain but agreeable to attempt with encouragement when given increased time for postural changes/movements. Following commands: Intact      Cueing Cueing Techniques: Verbal cues, Gestural cues, Tactile cues  Exercises General Exercises - Lower Extremity Ankle Circles/Pumps: AROM, AAROM, Both, 10 reps, Supine, Seated (~5 reps ea posture) Long Arc Quad: AROM, Both, Seated, Strengthening, 5 reps (5 reps first set, ~3 reps second set) Hip Flexion/Marching: AROM, Both, 10 reps, Seated, Strengthening, AAROM (AA a few reps for improved ROM; visual cues) Other Exercises Other Exercises: STS x 2 for BLE strengthening    General Comments General comments (skin integrity, edema, etc.): see VS in comments above; LUE bruising under where BP cuff had been + edema, RN notified; BP cuff moved to her R calf.      Pertinent Vitals/Pain Pain Assessment Pain Assessment: CPOT Faces Pain Scale: No hurt Facial Expression: Grimacing Body Movements: Protection Muscle Tension: Tense, rigid Compliance with ventilator (intubated pts.): Tolerating ventilator or movement Vocalization (extubated pts.): Talking in normal tone or no sound CPOT Total: 4 Pain Location: abdomen Pain Descriptors / Indicators: Grimacing, Operative site guarding Pain Intervention(s): Limited activity within patient's tolerance, Monitored  during session, Premedicated before session, Repositioned, Utilized relaxation techniques    Home Living                          Prior Function            PT Goals (current goals can now be found in the care plan section) Acute Rehab PT Goals Patient Stated Goal: not stated PT Goal Formulation: With patient Time For Goal Achievement: 12/14/23 Progress towards PT goals: Progressing toward goals    Frequency    Min 2X/week      PT Plan      Co-evaluation              AM-PAC PT "6 Clicks" Mobility   Outcome Measure  Help needed turning from your back to your side while in a flat bed without using bedrails?: A Little Help needed moving from lying on your back to sitting on the side of a flat bed without using bedrails?: Total Help needed moving to and from a bed to a chair (including a wheelchair)?: Total Help needed standing up from a chair using your arms (e.g., wheelchair or bedside chair)?: Total Help needed to walk in hospital room?: Total Help needed climbing 3-5 steps with a railing? : Total 6 Click Score: 8    End of Session Equipment Utilized During Treatment:  Oxygen Activity Tolerance: Patient tolerated treatment well;Other (comment);Patient limited by pain (abdominal incisional pain) Patient left: in bed;with call bell/phone within reach;with bed alarm set;Other (comment) (HOB >30*, pt defers fully upright due to c/o abdominal pain; kept SCDs off since BP cuff placed on her R calf at end of session) Nurse Communication: Mobility status;Other (comment) (LUE edema/bruising where BP cuff was located) PT Visit Diagnosis: Other abnormalities of gait and mobility (R26.89);Difficulty in walking, not elsewhere classified (R26.2);Muscle weakness (generalized) (M62.81)     Time: 1610-9604 PT Time Calculation (min) (ACUTE ONLY): 39 min  Charges:    $Therapeutic Exercise: 8-22 mins $Therapeutic Activity: 23-37 mins PT General Charges $$ ACUTE PT  VISIT: 1 Visit                     Trooper Olander P., PTA Acute Rehabilitation Services Secure Chat Preferred 9a-5:30pm Office: [phone]    Dorathy Kinsman [facility] 12/05/2023, 1:20 PM

## 2023-12-06 DIAGNOSIS — E870 Hyperosmolality and hypernatremia: Secondary | ICD-10-CM | POA: Diagnosis not present

## 2023-12-06 DIAGNOSIS — J9621 Acute and chronic respiratory failure with hypoxia: Secondary | ICD-10-CM | POA: Diagnosis not present

## 2023-12-06 DIAGNOSIS — I4891 Unspecified atrial fibrillation: Secondary | ICD-10-CM | POA: Diagnosis not present

## 2023-12-06 DIAGNOSIS — Z930 Tracheostomy status: Secondary | ICD-10-CM | POA: Diagnosis not present

## 2023-12-06 LAB — RENAL FUNCTION PANEL
Albumin: 2 g/dL — ABNORMAL LOW (ref 3.5–5.0)
Anion gap: 8 (ref 5–15)
BUN: 39 mg/dL — ABNORMAL HIGH (ref 8–23)
CO2: 26 mmol/L (ref 22–32)
Calcium: 9 mg/dL (ref 8.9–10.3)
Chloride: 102 mmol/L (ref 98–111)
Creatinine, Ser: 0.54 mg/dL (ref 0.44–1.00)
GFR, Estimated: >60 mL/min (ref >60)
Glucose, Bld: 127 mg/dL — ABNORMAL HIGH (ref 70–99)
Phosphorus: 3.5 mg/dL (ref 2.5–4.6)
Potassium: 3.8 mmol/L (ref 3.5–5.1)
Sodium: 136 mmol/L (ref 135–145)

## 2023-12-06 LAB — CBC
HCT: 21.8 % — ABNORMAL LOW (ref 36.0–46.0)
Hemoglobin: 6.7 g/dL — CL (ref 12.0–15.0)
MCH: 29.9 pg (ref 26.0–34.0)
MCHC: 30.7 g/dL (ref 30.0–36.0)
MCV: 97.3 fL (ref 80.0–100.0)
Platelets: 377 10*3/uL (ref 150–400)
RBC: 2.24 MIL/uL — ABNORMAL LOW (ref 3.87–5.11)
RDW: 16.5 % — ABNORMAL HIGH (ref 11.5–15.5)
WBC: 15.5 10*3/uL — ABNORMAL HIGH (ref 4.0–10.5)
nRBC: 0.1 % (ref 0.0–0.2)

## 2023-12-06 LAB — HEMOGLOBIN AND HEMATOCRIT, BLOOD
HCT: 26 % — ABNORMAL LOW (ref 36.0–46.0)
Hemoglobin: 7.9 g/dL — ABNORMAL LOW (ref 12.0–15.0)

## 2023-12-06 LAB — IRON AND TIBC
Iron: 17 ug/dL — ABNORMAL LOW (ref 28–170)
Saturation Ratios: 6 % — ABNORMAL LOW (ref 10.4–31.8)
TIBC: 283 ug/dL (ref 250–450)
UIBC: 266 ug/dL

## 2023-12-06 LAB — PREPARE RBC (CROSSMATCH)

## 2023-12-06 LAB — FERRITIN: Ferritin: 296 ng/mL (ref 11–307)

## 2023-12-06 LAB — GLUCOSE, CAPILLARY
Glucose-Capillary: 109 mg/dL — ABNORMAL HIGH (ref 70–99)
Glucose-Capillary: 89 mg/dL (ref 70–99)
Glucose-Capillary: 90 mg/dL (ref 70–99)
Glucose-Capillary: 96 mg/dL (ref 70–99)

## 2023-12-06 LAB — MAGNESIUM: Magnesium: 1.9 mg/dL (ref 1.7–2.4)

## 2023-12-06 MED ORDER — VIVONEX RTF PO LIQD
1000.0000 mL | ORAL | Status: DC
  Administered 2023-12-06 – 2023-12-09 (×5): 1000 mL
  Filled 2023-12-06 (×8): qty 1000

## 2023-12-06 MED ORDER — FREE WATER
300.0000 mL | Freq: Three times a day (TID) | Status: DC
  Administered 2023-12-06 – 2023-12-07 (×2): 300 mL

## 2023-12-06 MED ORDER — POTASSIUM CHLORIDE 20 MEQ PO PACK
40.0000 meq | PACK | Freq: Once | ORAL | Status: AC
  Administered 2023-12-06: 40 meq
  Filled 2023-12-06: qty 2

## 2023-12-06 MED ORDER — SODIUM CHLORIDE 0.9% IV SOLUTION: Freq: Once | INTRAVENOUS | Status: AC

## 2023-12-06 NOTE — Plan of Care (Signed)
  Problem: Clinical Measurements: Goal: Ability to maintain clinical measurements within normal limits will improve Outcome: Progressing   Problem: Pain Managment: Goal: General experience of comfort will improve and/or be controlled Outcome: Progressing   Problem: Safety: Goal: Ability to remain free from injury will improve Outcome: Progressing

## 2023-12-06 NOTE — Progress Notes (Signed)
 eLink Physician-Brief Progress Note Patient Name: JAKYLAH BASSINGER DOB: 1946/08/05 MRN: [id number]   Date of Service  12/06/2023  HPI/Events of Note  Hemoglobin 6.7 gm / dl. No evidence of overt bleeding.  eICU Interventions  One unit of PRBC ordered transfused.        Thomasene Lot Nawal Burling 12/06/2023, 6:08 AM

## 2023-12-06 NOTE — Progress Notes (Signed)
 23 Days Post-Op   Subjective/Chief Complaint: Output around 1500, on vent, responsive,. Due to get blood this am   Objective: Vital signs in last 24 hours: Temp:  [97.9 F (36.6 C)-98.4 F (36.9 C)] 98 F (36.7 C) (03/29 0741) Pulse Rate:  [66-90] 76 (03/29 0800) Resp:  [18-34] 28 (03/29 0800) BP: (80-148)/(40-69) 95/44 (03/29 0800) SpO2:  [90 %-100 %] 100 % (03/29 0800) FiO2 (%):  [40 %] 40 % (03/29 0741) Weight:  [50 kg] 50 kg (03/29 0500) Last BM Date : 12/05/23  Intake/Output from previous day: 03/28 0701 - 03/29 0700 In: 2539.1 [P.O.:125; YQ/MV:7846; IV Piggyback:89.1] Out: 3775 [Urine:2200; Stool:1575] Intake/Output this shift: No intake/output data recorded.  Abd soft nontender incision without infection small amount eschar, liquid stool and air in bag, not sure what fistulae are from woc note and don't see picture of this  Lab Results:  Recent Labs    12/05/23 0508 12/06/23 0523  WBC 15.0* 15.5*  HGB 7.6* 6.7*  HCT 24.8* 21.8*  PLT 416* 377   BMET Recent Labs    12/05/23 0508 12/06/23 0523  NA 139 136  K 3.7 3.8  CL 103 102  CO2 28 26  GLUCOSE 112* 127*  BUN 30* 39*  CREATININE 0.51 0.54  CALCIUM 8.8* 9.0   PT/INR No results for input(s): "LABPROT", "INR" in the last 72 hours. ABG No results for input(s): "PHART", "HCO3" in the last 72 hours.  Invalid input(s): "PCO2", "PO2"  Studies/Results: No results found.  Anti-infectives: Anti-infectives (From admission, onward)    Start     Dose/Rate Route Frequency Ordered Stop   11/25/23 1145  meropenem (MERREM) 1 g in sodium chloride 0.9 % 100 mL IVPB        1 g 200 mL/hr over 30 Minutes Intravenous Every 12 hours 11/25/23 1045 12/01/23 2100   11/20/23 1045  tobramycin (PF) (TOBI) nebulizer solution 300 mg  Status:  Discontinued        300 mg Nebulization 2 times daily 11/20/23 0952 11/23/23 1133   11/17/23 1000  meropenem (MERREM) 1 g in sodium chloride 0.9 % 100 mL IVPB  Status:  Discontinued         1 g 200 mL/hr over 30 Minutes Intravenous Every 12 hours 11/17/23 0832 11/23/23 1133   11/15/23 1400  piperacillin-tazobactam (ZOSYN) IVPB 3.375 g  Status:  Discontinued        3.375 g 100 mL/hr over 30 Minutes Intravenous Every 8 hours 11/15/23 1223 11/17/23 0832   11/11/23 2000  piperacillin-tazobactam (ZOSYN) IVPB 3.375 g  Status:  Discontinued        3.375 g 12.5 mL/hr over 240 Minutes Intravenous Every 8 hours 11/11/23 1233 11/15/23 1223   11/11/23 1245  piperacillin-tazobactam (ZOSYN) IVPB 3.375 g        3.375 g 100 mL/hr over 30 Minutes Intravenous  Once 11/11/23 1233 11/11/23 1316       Assessment/Plan: POD 25/23 S/P ex-lap small bowel resection, left open; ex-lap, small bowel resection, ileostomy and closure, Dr. Dwain Sarna for SBO - ileostomy output 1575 ml/24h - Continue fiber - 6 times daily. imodium to 8mg  QID 3/26, lomotil per last week. Added iron 3/27.  - continue TFs.  Prealbumin 19.  No needs for TNA at this time to supplement per dietitians concerns yesterday. - per WOC RN abnormality of stoma noted during pouch change this am - stoma not able to be completely evaluated with one piece in place. However stoma is pink, viable, and  functional. Will check stoma next pouch change, but even if she has mucosal hyperplasia or fistula, nothing to do about this currently. Please call our team when changing pouch - Remainder of care per CCM. Surgery will continue to follow.   FEN: post pyloric Tfs (continue post pyloric due to aspiration risk) ID: none VTE: eliquis bid   Emelia Loron 12/06/2023

## 2023-12-06 NOTE — Progress Notes (Signed)
 NAME:  Jenny White, MRN:  [id number], DOB:  27-May-1946, LOS: 25 ADMISSION DATE:  11/11/2023, CONSULTATION DATE:  11/11/2023 REFERRING MD:  Dwain Sarna - CCS CHIEF COMPLAINT: Shock    History of Present Illness:  Jenny White 78 year old woman with a pmhx significant for  who presented to the emergency room with complaints of abdominal pain with N/V for the past week. She states the abdominal  pain got worsened after dinner. She reported experiencing nausea and  vomiting this morning.    In the ED  her labs were significant for a lactate 4.1 After fluid resuscitation, Lactate decreased to 2.5. CT scan showed high grade small bowel obstruction with transition point in the right lower quadrant along the distal ileum. Some trace of ascites and mesenteric stranding.Concern for bowel wall ischemia. Broad spectrum antibiotics ( zosyn) was started, blood cultures pending.    Patient was taken to the ER emergently with CCS 3/4 for Ex-lap / LOA. Intraoperative course  was notable for free fluid on entry, with closed loop bowel obstruction noted secondary to tight band adhesion. Patient left in discontinuity/ anastomosis not completed with open abdomen and plan for second look in 24-48 hrs. Patient remained intubated and was transferred to ICU in critical condition.   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 Elevated LA found to have SBO taken emergently to surgery for EX-LAP.  3/7 extubated reintubated 3/10 extubated again 3/11 OFF NIPPV 3/12 got ativan at HS. Sleepy and difficult to arouse all day.  3/13 transferred to ICU for obtundation. Re-intubated.  3/16 Extubated  3/17 zyprexa x 1 overnight,  Obtunded , failed  BiPAP, reintubated 3/17 several conversations with unhappy family members, offered transfer to different facility, called [facility]>> no beds 3/18 resumed BuSpar, gabapentin, stopped oxycodone & fent gtt since oversedated 3/19 Amiodarone IV extravasated LUE 3/20 dex, low dose NE, diuresing  3/21 tracheostomy placed at bedside 3/24 completed antibitoic therapy for aeromonal and hafnia alvei pneumonia with meropenem for 14 days 3/25 transferred to for ongoing care - back on ventilatory support  Interim History / Subjective:  -Has intermittently done some short periods of PSV.  Did not tolerate ATC on 3/28.  Associated with increased work of breathing, anxiety -Some abdominal pain noted with increased work of breathing, appears to be related to accessory abdominal muscle use -Hemoglobin 6.7, 1 unit PRBC ordered this morning -I/O+ 830 cc total -Continues to have high ileostomy output > 1.6 L last 24 hours   Objective   Blood pressure (!) 95/44, pulse 76, temperature 98 F (36.7 C), temperature source Oral, resp. rate (!) 28, height 5\' 7"  (1.702 m), weight 50 kg, SpO2 100%.    Vent Mode: CPAP;PSV FiO2 (%):  [40 %] 40 % Set Rate:  [18 bmp] 18 bmp Vt Set:  [420 mL] 420 mL PEEP:  [5 cmH20] 5 cmH20 Pressure Support:  [8 cmH20-20 cmH20] 8 cmH20 Plateau Pressure:  [15 cmH20-16 cmH20] 16 cmH20   Intake/Output Summary (Last 24 hours) at 12/06/2023 0808 Last data filed at 12/06/2023 0600 Gross per 24 hour  Intake 2464.05 ml  Output 3775 ml  Net -1310.95 ml   Filed Weights   12/04/23 0500 12/05/23 0500 12/06/23 0500  Weight: 47.2 kg 44.9 kg 50 kg    Examination: General: Elderly woman laying in bed, comfortable Lungs: Trach in place, sutures out on 3/28.  Stoma looks good.  No secretions CV: Regular, no murmur Abdomen: Nontender Extremities: No edema Neuro: Wakes easily, interacts, follows commands.  Generalized weakness but nothing focal GU: Foley catheter  CXR 3/26 mild  increase R lower lobe markings, infiltrate vs atelectasis. Otherwise unchanged from 3/21 CXR  Resolved Hospital Problem list   Hypotension - not longer on midodrine encephalopathy  Assessment & Plan:  Acute on chronic resp failure with hypoxia, hypercarbia Severe COPD -FEV1 30% RLL Cancer s/p SBRT S/p tracheostomy 3/21 ETT 3/4 >>3/7 >> 3/10, 3/13- 3/16, 3/17 >>3/21 -3/25 -Continue pressure-point ventilation as she can tolerate -Work to transition to ATC if able, has been able to do some through the hospitalization but did not tolerate on 3/28 -Routine trach care -VAP prevention orders -Guaifenesin scheduled -Pulmonary hygiene, PT/OT -Careful with sedating medications -Follow intermittent chest x-ray -Low-dose Xanax available for anxiety component to her tachypnea, work of breathing  High grade small bowel obstruction  Severe protein calorie malnutrition 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 nutrition once able to get to ATC -Appreciate surgery recommendations, fiber, loperamide -Currently ostomy output is a barrier for discharge -Simethicone added for abdominal discomfort, bloating 3/28  Anemia -Following CBC -Goal hemoglobin 7.0 -Check iron studies -Follow for any evidence of active bleeding, none noted in ileostomy output  HAP:  Aeromonas and Hafnia Alvei pneumonia > both S to meropenmem -Treated, completed 3/24 -Off norepinephrine    New onset paroxymal atrial fibrillation - NSR. May be secondary to acute illness - echo 3/6 EF 60-65%, G1DD, mildly reduced RVSF, imaging difficult -Continue Eliquis for now, note anemia.  Will need to consider anticoagulation if hemoglobin continues to drop -Optimize electrolytes  Severe anxiety at baseline -Gabapentin, BuSpar as ordered   Hypernatremia, resolved -Continue free water -Follow BMP  Diabetes type 2   -Following off SSI -CBGs as ordered   Amiodarone extravasation in left arm  s/p hyaluronidase injection -Limb elevation, wound care   Deconditioning -PT/OT -Plan to Select once ostomy output close to 1 L daily  Best Practice (right click and "Reselect all SmartList Selections" daily)   Diet/type: tubefeeds DVT prophylaxis: DOAC GI prophylaxis: N/A Lines: Central line Foley:  Yes, and it is still needed Code Status:  full code Last date of multidisciplinary goals of care discussion [--]  Labs   CBC: Recent Labs  Lab 12/02/23 0412 12/03/23 0346 12/04/23 0416 12/05/23 0508 12/06/23 0523  WBC 11.4* 12.4* 14.3* 15.0* 15.5*  HGB 8.2* 7.8* 7.1* 7.6* 6.7*  HCT 27.7* 25.3* 23.7* 24.8* 21.8*  MCV 100.0 97.7 97.5 96.5 97.3  PLT 429* 394 374 416* 377    Basic Metabolic Panel: Recent Labs  Lab 12/02/23 0412 12/03/23 0346 12/04/23 0416 12/05/23 0508 12/06/23 0523  NA 155* 149* 140 139 136  K 4.0 3.9 4.2 3.7 3.8  CL 116* 109 103 103 102  CO2 34* 32 30 28 26   GLUCOSE 103* 106* 118* 112* 127*  BUN 35* 37* 32* 30* 39*  CREATININE 0.62 0.62 0.61 0.51 0.54  CALCIUM 9.6 9.5 9.0 8.8* 9.0  MG 1.9 1.8 1.6* 2.1 1.9  PHOS 2.4* 2.5 2.6 3.4 3.5   GFR: Estimated Creatinine Clearance: 45.7 mL/min (by C-G formula based on SCr of 0.54 mg/dL). Recent  Labs  Lab 12/03/23 0346 12/04/23 0416 12/05/23 0508 12/06/23 0523  WBC 12.4* 14.3* 15.0* 15.5*    Liver Function Tests: Recent Labs  Lab 11/30/23 0410 12/01/23 0333 12/05/23 0508 12/06/23 0523  ALBUMIN 3.1* 2.5* 2.2* 2.0*   No results for input(s): "LIPASE", "AMYLASE" in the last 168 hours. No results for input(s): "AMMONIA" in the last 168 hours.  ABG    Component Value Date/Time   PHART 7.328 (L) 11/24/2023 1348   PCO2ART 60.6 (H) 11/24/2023 1348   PO2ART 111 (H) 11/24/2023 1348   HCO3 31.6 (H) 11/24/2023 1348   TCO2 33 (H) 11/24/2023 1348   ACIDBASEDEF 1.0 11/17/2023 1333   O2SAT 98 11/24/2023 1348      Coagulation Profile: No results for input(s): "INR", "PROTIME" in the last 168 hours.  Cardiac Enzymes: No results for input(s): "CKTOTAL", "CKMB", "CKMBINDEX", "TROPONINI" in the last 168 hours.  HbA1C: Hgb A1c MFr Bld  Date/Time Value Ref Range Status  11/14/2023 03:35 PM 5.8 (H) 4.8 - 5.6 % Final    Comment:    (NOTE) Pre diabetes:          5.7%-6.4%  Diabetes:              >6.4%  Glycemic control for   <7.0% adults with diabetes     CBG: Recent Labs  Lab 12/05/23 0359 12/05/23 0804 12/05/23 1544 12/05/23 2347 12/06/23 0609  GLUCAP 98 121* 109* 112* 109*     Critical care time: 31 minutes     Levy Pupa, MD, PhD 12/06/2023, 8:08 AM [facility] [phone] or if no answer before 7:00PM call [phone] For any issues after 7:00PM please call eLink [phone]

## 2023-12-07 DIAGNOSIS — E870 Hyperosmolality and hypernatremia: Secondary | ICD-10-CM | POA: Diagnosis not present

## 2023-12-07 DIAGNOSIS — J9621 Acute and chronic respiratory failure with hypoxia: Secondary | ICD-10-CM | POA: Diagnosis not present

## 2023-12-07 DIAGNOSIS — I4891 Unspecified atrial fibrillation: Secondary | ICD-10-CM | POA: Diagnosis not present

## 2023-12-07 DIAGNOSIS — J9611 Chronic respiratory failure with hypoxia: Secondary | ICD-10-CM

## 2023-12-07 DIAGNOSIS — Z930 Tracheostomy status: Secondary | ICD-10-CM | POA: Diagnosis not present

## 2023-12-07 LAB — RENAL FUNCTION PANEL
Albumin: 2.1 g/dL — ABNORMAL LOW (ref 3.5–5.0)
Anion gap: 4 — ABNORMAL LOW (ref 5–15)
BUN: 50 mg/dL — ABNORMAL HIGH (ref 8–23)
CO2: 27 mmol/L (ref 22–32)
Calcium: 9.2 mg/dL (ref 8.9–10.3)
Chloride: 103 mmol/L (ref 98–111)
Creatinine, Ser: 0.62 mg/dL (ref 0.44–1.00)
GFR, Estimated: >60 mL/min (ref >60)
Glucose, Bld: 106 mg/dL — ABNORMAL HIGH (ref 70–99)
Phosphorus: 2.9 mg/dL (ref 2.5–4.6)
Potassium: 4.6 mmol/L (ref 3.5–5.1)
Sodium: 134 mmol/L — ABNORMAL LOW (ref 135–145)

## 2023-12-07 LAB — CBC
HCT: 25.7 % — ABNORMAL LOW (ref 36.0–46.0)
Hemoglobin: 7.9 g/dL — ABNORMAL LOW (ref 12.0–15.0)
MCH: 29.6 pg (ref 26.0–34.0)
MCHC: 30.7 g/dL (ref 30.0–36.0)
MCV: 96.3 fL (ref 80.0–100.0)
Platelets: 386 10*3/uL (ref 150–400)
RBC: 2.67 MIL/uL — ABNORMAL LOW (ref 3.87–5.11)
RDW: 16 % — ABNORMAL HIGH (ref 11.5–15.5)
WBC: 15.3 10*3/uL — ABNORMAL HIGH (ref 4.0–10.5)
nRBC: 0.1 % (ref 0.0–0.2)

## 2023-12-07 LAB — BPAM RBC
Blood Product Expiration Date: 202504162359
ISSUE DATE / TIME: 202503291010
Unit Type and Rh: 8400

## 2023-12-07 LAB — MAGNESIUM: Magnesium: 2 mg/dL (ref 1.7–2.4)

## 2023-12-07 LAB — TYPE AND SCREEN
ABO/RH(D): AB POS
Antibody Screen: NEGATIVE
Unit division: 0

## 2023-12-07 LAB — GLUCOSE, CAPILLARY
Glucose-Capillary: 105 mg/dL — ABNORMAL HIGH (ref 70–99)
Glucose-Capillary: 112 mg/dL — ABNORMAL HIGH (ref 70–99)
Glucose-Capillary: 121 mg/dL — ABNORMAL HIGH (ref 70–99)
Glucose-Capillary: 94 mg/dL (ref 70–99)

## 2023-12-07 MED ORDER — ALPRAZOLAM 0.5 MG PO TABS
0.2500 mg | ORAL_TABLET | Freq: Two times a day (BID) | ORAL | Status: DC | PRN
  Administered 2023-12-07 – 2023-12-08 (×2): 0.25 mg
  Filled 2023-12-07 (×2): qty 1

## 2023-12-07 MED ORDER — BUSPIRONE HCL 10 MG PO TABS
10.0000 mg | ORAL_TABLET | Freq: Three times a day (TID) | ORAL | Status: DC
  Administered 2023-12-07 – 2023-12-08 (×4): 10 mg
  Filled 2023-12-07 (×4): qty 1

## 2023-12-07 NOTE — Plan of Care (Signed)
   Problem: Clinical Measurements: Goal: Will remain free from infection Outcome: Progressing   Problem: Clinical Measurements: Goal: Diagnostic test results will improve Outcome: Progressing   Problem: Clinical Measurements: Goal: Respiratory complications will improve Outcome: Progressing

## 2023-12-07 NOTE — Progress Notes (Addendum)
 NAME:  Jenny White, MRN:  [id number], DOB:  1945-10-15, LOS: 26 ADMISSION DATE:  11/11/2023, CONSULTATION DATE:  11/11/2023 REFERRING MD:  Dwain Sarna - [facility] CHIEF COMPLAINT: Shock    History of Present Illness:  Jenny White 78 year old woman with a pmhx significant for  who presented to the emergency room with complaints of abdominal pain with N/V for the past week. She states the abdominal  pain got worsened after dinner. She reported experiencing nausea and  vomiting this morning.    In the ED  her labs were significant for a lactate 4.1 After fluid resuscitation, Lactate decreased to 2.5. CT scan showed high grade small bowel obstruction with transition point in the right lower quadrant along the distal ileum. Some trace of ascites and mesenteric stranding.Concern for bowel wall ischemia. Broad spectrum antibiotics ( zosyn) was started, blood cultures pending.    Patient was taken to the ER emergently with CCS 3/4 for Ex-lap / LOA. Intraoperative course  was notable for free fluid on entry, with closed loop bowel obstruction noted secondary to tight band adhesion. Patient left in discontinuity/ anastomosis not completed with open abdomen and plan for second look in 24-48 hrs. Patient remained intubated and was transferred to ICU in critical condition.   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 Elevated LA found to have SBO taken emergently to surgery for EX-LAP.  3/7 extubated reintubated 3/10 extubated again 3/11 OFF NIPPV 3/12 got ativan at HS. Sleepy and difficult to arouse all day.  3/13 transferred to ICU for obtundation. Re-intubated.  3/16 Extubated  3/17 zyprexa x 1 overnight,  Obtunded , failed  BiPAP, reintubated 3/17 several conversations with unhappy family members, offered transfer to different facility, called [facility] no beds 3/18 resumed BuSpar, gabapentin, stopped oxycodone & fent gtt since oversedated 3/19 Amiodarone IV extravasated LUE 3/20 dex, low dose NE, diuresing  3/21 tracheostomy placed at bedside 3/24 completed antibitoic therapy for aeromonal and hafnia alvei pneumonia with meropenem for 14 days 3/25 transferred to for ongoing care - back on ventilatory support  Interim History / Subjective:  PSV  trialed this AM s/p 10 hour trial yesterday. Patient is currently very anxious. Has Ostomy output 1L over last 24 hours   Objective   Blood pressure (!) 106/46, pulse 68, temperature 98 F (36.7 C), temperature source Oral, resp. rate (!) 25, height 5\' 7"  (1.702 m), weight 47.7 kg, SpO2 100%.    Vent Mode: CPAP;PSV FiO2 (%):  [40 %] 40 % Set Rate:  [18 bmp] 18 bmp Vt Set:  [420 mL] 420 mL PEEP:  [5 cmH20] 5 cmH20 Pressure Support:  [8 cmH20] 8 cmH20 Plateau Pressure:  [17 cmH20-23 cmH20] 17 cmH20   Intake/Output Summary (Last 24 hours) at 12/07/2023 0824 Last data filed at 12/07/2023 0630 Gross per 24 hour  Intake 3402.5 ml  Output 2250 ml  Net 1152.5 ml   Filed Weights   12/05/23 0500 12/06/23 0500 12/07/23 0500  Weight: 44.9 kg 50 kg 47.7 kg    Examination: General:  anxious appearing woman laying in bed, anxious Lungs: Tach in place. MMM CV: Regular, no murmurs  Abdomen: non tender non distended. Ostomy site without drainage or erythema Extremities: No  edema Neuro: awake, interactive, voluntary movements this AM GU: Foley catheter  CXR 3/26 mild increase R lower lobe markings, infiltrate vs atelectasis. Otherwise unchanged from 3/21 CXR  Resolved Hospital Problem list   Hypotension - not longer on midodrine encephalopathy  Assessment & Plan:  Acute on chronic resp failure with hypoxia, hypercarbia Severe COPD -FEV1 30% RLL Cancer s/p  SBRT S/p tracheostomy 3/21 ETT 3/4 >>3/7 >> 3/10, 3/13- 3/16, 3/17 >>3/21 -3/25 -Continue pressure-point ventilation as she can tolerate -Work to transition to ATC if able, PS trial today -Routine trach care -VAP prevention orders -Guaifenesin scheduled -Pulmonary hygiene, PT/OT -Careful with sedating medications -Follow intermittent chest x-ray -Low-dose Xanax available for anxiety component to her tachypnea, work of breathing  High grade small bowel obstruction  Severe protein calorie malnutrition 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 nutrition once able to get to ATC -Appreciate surgery recommendations, fiber, loperamide -Currently ostomy output is a barrier for discharge - Per pharmacy, patient with high dose of imodium; will need to down titrate to safe levels - Qtc monitoring - Simethicone added for abdominal discomfort, bloating 3/28  Anemia -Following CBC -Goal hemoglobin 7.0 -Normal ferritin, sat ratio 6 -Follow for any evidence of active bleeding, none noted in ileostomy output - Continue iron supplementation  Severe anxiety at baseline -Gabapentin,  -Increase BuSpar BID > TID - Low dose Xanax  HAP:  Aeromonas and Hafnia Alvei pneumonia > both S to meropenmem -Treated, completed 3/24 -Off norepinephrine    New onset paroxymal atrial fibrillation - NSR. May be secondary to acute illness - echo 3/6 EF 60-65%, G1DD, mildly reduced RVSF, imaging difficult -Continue Eliquis for now, note anemia.  Will need to consider anticoagulation if hemoglobin continues to drop -Optimize electrolytes  Hypernatremia, resolved Hyponatremia - likely in setting of free trial and increase tube feeds  - DC free water - Follow BMP  Diabetes type 2  -Following off SSI -CBGs with tube feeds    Amiodarone extravasation in left arm  s/p hyaluronidase  injection -Limb elevation, wound care   Deconditioning -PT/OT -Plan to [facility] once ostomy output close to 1 L daily  Best Practice (right click and "Reselect all SmartList Selections" daily)   Diet/type: tubefeeds DVT prophylaxis: DOAC GI prophylaxis: N/A Lines: Central line Foley:  Yes, and it is still needed Code Status:  full code Last date of multidisciplinary goals of care discussion [--]  Labs   CBC: Recent Labs  Lab 12/03/23 0346 12/04/23 0416 12/05/23 0508 12/06/23 0523 12/06/23 1517 12/07/23 0505  WBC 12.4* 14.3* 15.0* 15.5*  --  15.3*  HGB 7.8* 7.1* 7.6* 6.7* 7.9* 7.9*  HCT 25.3* 23.7* 24.8* 21.8* 26.0* 25.7*  MCV 97.7 97.5 96.5 97.3  --  96.3  PLT 394 374 416* 377  --  386    Basic Metabolic Panel: Recent Labs  Lab 12/03/23 0346 12/04/23 0416 12/05/23 0508 12/06/23 0523 12/07/23 0505  NA 149* 140 139 136 134*  K 3.9 4.2 3.7 3.8 4.6  CL 109 103 103 102 103  CO2 32 30 28 26 27   GLUCOSE 106* 118* 112* 127* 106*  BUN 37* 32* 30* 39* 50*  CREATININE 0.62 0.61 0.51 0.54 0.62  CALCIUM 9.5 9.0 8.8* 9.0 9.2  MG 1.8 1.6* 2.1 1.9 2.0  PHOS 2.5 2.6 3.4 3.5 2.9   GFR: Estimated Creatinine Clearance: 43.6 mL/min (by C-G formula based  on SCr of 0.62 mg/dL). Recent Labs  Lab 12/04/23 0416 12/05/23 0508 12/06/23 0523 12/07/23 0505  WBC 14.3* 15.0* 15.5* 15.3*    Liver Function Tests: Recent Labs  Lab 12/01/23 0333 12/05/23 0508 12/06/23 0523 12/07/23 0505  ALBUMIN 2.5* 2.2* 2.0* 2.1*   No results for input(s): "LIPASE", "AMYLASE" in the last 168 hours. No results for input(s): "AMMONIA" in the last 168 hours.  ABG    Component Value Date/Time   PHART 7.328 (L) 11/24/2023 1348   PCO2ART 60.6 (H) 11/24/2023 1348   PO2ART 111 (H) 11/24/2023 1348   HCO3 31.6 (H) 11/24/2023 1348   TCO2 33 (H) 11/24/2023 1348   ACIDBASEDEF 1.0 11/17/2023 1333   O2SAT 98 11/24/2023 1348     Coagulation Profile: No results for input(s): "INR", "PROTIME" in  the last 168 hours.  Cardiac Enzymes: No results for input(s): "CKTOTAL", "CKMB", "CKMBINDEX", "TROPONINI" in the last 168 hours.  HbA1C: Hgb A1c MFr Bld  Date/Time Value Ref Range Status  11/14/2023 03:35 PM 5.8 (H) 4.8 - 5.6 % Final    Comment:    (NOTE) Pre diabetes:          5.7%-6.4%  Diabetes:              >6.4%  Glycemic control for   <7.0% adults with diabetes     CBG: Recent Labs  Lab 12/06/23 0609 12/06/23 1159 12/06/23 1809 12/06/23 2336 12/07/23 0509  GLUCAP 109* 96 89 90 94     Critical care time:     Morene Crocker, MD [facility] Medicine Program - PGY-2 12/07/2023, 8:24 AM

## 2023-12-08 ENCOUNTER — Other Ambulatory Visit: Payer: Self-pay

## 2023-12-08 ENCOUNTER — Inpatient Hospital Stay (HOSPITAL_COMMUNITY)

## 2023-12-08 DIAGNOSIS — Z9889 Other specified postprocedural states: Secondary | ICD-10-CM | POA: Diagnosis not present

## 2023-12-08 DIAGNOSIS — R9431 Abnormal electrocardiogram [ECG] [EKG]: Secondary | ICD-10-CM | POA: Diagnosis not present

## 2023-12-08 DIAGNOSIS — R4182 Altered mental status, unspecified: Secondary | ICD-10-CM

## 2023-12-08 DIAGNOSIS — K567 Ileus, unspecified: Secondary | ICD-10-CM | POA: Diagnosis not present

## 2023-12-08 DIAGNOSIS — J9622 Acute and chronic respiratory failure with hypercapnia: Secondary | ICD-10-CM | POA: Diagnosis not present

## 2023-12-08 DIAGNOSIS — J9621 Acute and chronic respiratory failure with hypoxia: Secondary | ICD-10-CM | POA: Diagnosis not present

## 2023-12-08 LAB — CBC
HCT: 26.1 % — ABNORMAL LOW (ref 36.0–46.0)
Hemoglobin: 8 g/dL — ABNORMAL LOW (ref 12.0–15.0)
MCH: 30.3 pg (ref 26.0–34.0)
MCHC: 30.7 g/dL (ref 30.0–36.0)
MCV: 98.9 fL (ref 80.0–100.0)
Platelets: 377 10*3/uL (ref 150–400)
RBC: 2.64 MIL/uL — ABNORMAL LOW (ref 3.87–5.11)
RDW: 16 % — ABNORMAL HIGH (ref 11.5–15.5)
WBC: 16.8 10*3/uL — ABNORMAL HIGH (ref 4.0–10.5)
nRBC: 0.1 % (ref 0.0–0.2)

## 2023-12-08 LAB — RENAL FUNCTION PANEL
Albumin: 2.1 g/dL — ABNORMAL LOW (ref 3.5–5.0)
Anion gap: 5 (ref 5–15)
BUN: 61 mg/dL — ABNORMAL HIGH (ref 8–23)
CO2: 27 mmol/L (ref 22–32)
Calcium: 9.5 mg/dL (ref 8.9–10.3)
Chloride: 102 mmol/L (ref 98–111)
Creatinine, Ser: 0.69 mg/dL (ref 0.44–1.00)
GFR, Estimated: >60 mL/min (ref >60)
Glucose, Bld: 112 mg/dL — ABNORMAL HIGH (ref 70–99)
Phosphorus: 3.6 mg/dL (ref 2.5–4.6)
Potassium: 4.8 mmol/L (ref 3.5–5.1)
Sodium: 134 mmol/L — ABNORMAL LOW (ref 135–145)

## 2023-12-08 LAB — URINALYSIS, COMPLETE (UACMP) WITH MICROSCOPIC
Bilirubin Urine: NEGATIVE
Glucose, UA: NEGATIVE mg/dL
Ketones, ur: NEGATIVE mg/dL
Nitrite: NEGATIVE
Protein, ur: 30 mg/dL — AB
RBC / HPF: >50 RBC/hpf (ref 0–5)
Specific Gravity, Urine: 1.011 (ref 1.005–1.030)
WBC, UA: >50 WBC/hpf (ref 0–5)
pH: 5 (ref 5.0–8.0)

## 2023-12-08 LAB — POCT I-STAT 7, (LYTES, BLD GAS, ICA,H+H)
Acid-base deficit: 3 mmol/L — ABNORMAL HIGH (ref 0.0–2.0)
Acid-base deficit: 4 mmol/L — ABNORMAL HIGH (ref 0.0–2.0)
Acid-base deficit: 3 mmol/L — ABNORMAL HIGH (ref 0.0–2.0)
Bicarbonate: 26.4 mmol/L (ref 20.0–28.0)
Bicarbonate: 25.4 mmol/L (ref 20.0–28.0)
Bicarbonate: 25.1 mmol/L (ref 20.0–28.0)
Calcium, Ion: 1.49 mmol/L — ABNORMAL HIGH (ref 1.15–1.40)
Calcium, Ion: 1.47 mmol/L — ABNORMAL HIGH (ref 1.15–1.40)
Calcium, Ion: 1.47 mmol/L — ABNORMAL HIGH (ref 1.15–1.40)
HCT: 27 % — ABNORMAL LOW (ref 36.0–46.0)
HCT: 26 % — ABNORMAL LOW (ref 36.0–46.0)
HCT: 27 % — ABNORMAL LOW (ref 36.0–46.0)
Hemoglobin: 9.2 g/dL — ABNORMAL LOW (ref 12.0–15.0)
Hemoglobin: 8.8 g/dL — ABNORMAL LOW (ref 12.0–15.0)
Hemoglobin: 9.2 g/dL — ABNORMAL LOW (ref 12.0–15.0)
O2 Saturation: 84 %
O2 Saturation: 97 %
O2 Saturation: 98 %
Patient temperature: 96.3
Patient temperature: 96.3
Patient temperature: 93.1
Potassium: 5.2 mmol/L — ABNORMAL HIGH (ref 3.5–5.1)
Potassium: 5.2 mmol/L — ABNORMAL HIGH (ref 3.5–5.1)
Potassium: 5.3 mmol/L — ABNORMAL HIGH (ref 3.5–5.1)
Sodium: 129 mmol/L — ABNORMAL LOW (ref 135–145)
Sodium: 128 mmol/L — ABNORMAL LOW (ref 135–145)
Sodium: 127 mmol/L — ABNORMAL LOW (ref 135–145)
TCO2: 29 mmol/L (ref 22–32)
TCO2: 28 mmol/L (ref 22–32)
TCO2: 27 mmol/L (ref 22–32)
pCO2 arterial: 71.1 mmHg (ref 32–48)
pCO2 arterial: 71.2 mmHg (ref 32–48)
pCO2 arterial: 54.3 mmHg — ABNORMAL HIGH (ref 32–48)
pH, Arterial: 7.171 — CL (ref 7.35–7.45)
pH, Arterial: 7.153 — CL (ref 7.35–7.45)
pH, Arterial: 7.256 — ABNORMAL LOW (ref 7.35–7.45)
pO2, Arterial: 59 mmHg — ABNORMAL LOW (ref 83–108)
pO2, Arterial: 115 mmHg — ABNORMAL HIGH (ref 83–108)
pO2, Arterial: 121 mmHg — ABNORMAL HIGH (ref 83–108)

## 2023-12-08 LAB — LACTIC ACID, PLASMA
Lactic Acid, Venous: 0.5 mmol/L (ref 0.5–1.9)
Lactic Acid, Venous: 0.7 mmol/L (ref 0.5–1.9)

## 2023-12-08 LAB — GLUCOSE, CAPILLARY
Glucose-Capillary: 122 mg/dL — ABNORMAL HIGH (ref 70–99)
Glucose-Capillary: 80 mg/dL (ref 70–99)
Glucose-Capillary: 88 mg/dL (ref 70–99)
Glucose-Capillary: 91 mg/dL (ref 70–99)

## 2023-12-08 LAB — ECHOCARDIOGRAM LIMITED
Height: 67 in
Weight: 1682.55 [oz_av]

## 2023-12-08 LAB — MRSA NEXT GEN BY PCR, NASAL: MRSA by PCR Next Gen: NOT DETECTED

## 2023-12-08 LAB — TROPONIN I (HIGH SENSITIVITY): Troponin I (High Sensitivity): 31 ng/L — ABNORMAL HIGH (ref <18)

## 2023-12-08 LAB — MAGNESIUM: Magnesium: 2 mg/dL (ref 1.7–2.4)

## 2023-12-08 LAB — PROCALCITONIN: Procalcitonin: <0.1 ng/mL

## 2023-12-08 MED ORDER — VANCOMYCIN HCL IN DEXTROSE 1-5 GM/200ML-% IV SOLN
1000.0000 mg | INTRAVENOUS | Status: AC
  Administered 2023-12-08: 1000 mg via INTRAVENOUS
  Filled 2023-12-08: qty 200

## 2023-12-08 MED ORDER — LACTATED RINGERS IV BOLUS
1000.0000 mL | Freq: Once | INTRAVENOUS | Status: AC
  Administered 2023-12-08: 1000 mL via INTRAVENOUS

## 2023-12-08 MED ORDER — VANCOMYCIN HCL 750 MG/150ML IV SOLN: 750.0000 mg | INTRAVENOUS | Status: DC

## 2023-12-08 MED ORDER — LOPERAMIDE HCL 1 MG/7.5ML PO SUSP
4.0000 mg | Freq: Four times a day (QID) | ORAL | Status: DC
  Administered 2023-12-08 – 2023-12-11 (×14): 4 mg
  Filled 2023-12-08 (×16): qty 30

## 2023-12-08 MED ORDER — SODIUM CHLORIDE 0.9 % IV SOLN
1.0000 g | Freq: Two times a day (BID) | INTRAVENOUS | Status: DC
  Administered 2023-12-08 – 2023-12-11 (×6): 1 g via INTRAVENOUS
  Filled 2023-12-08 (×6): qty 20

## 2023-12-08 MED ORDER — SODIUM CHLORIDE 0.9 % IV SOLN
250.0000 mL | INTRAVENOUS | Status: AC
Start: 2023-12-08 — End: 2023-12-09
  Administered 2023-12-08: 250 mL via INTRAVENOUS

## 2023-12-08 MED ORDER — NOREPINEPHRINE 4 MG/250ML-% IV SOLN
2.0000 ug/min | INTRAVENOUS | Status: DC
  Administered 2023-12-08: 6 ug/min via INTRAVENOUS
  Administered 2023-12-08: 2 ug/min via INTRAVENOUS
  Administered 2023-12-09: 6 ug/min via INTRAVENOUS
  Administered 2023-12-10: 3 ug/min via INTRAVENOUS
  Filled 2023-12-08 (×4): qty 250

## 2023-12-08 MED ORDER — BUSPIRONE HCL 10 MG PO TABS
10.0000 mg | ORAL_TABLET | Freq: Two times a day (BID) | ORAL | Status: DC
  Administered 2023-12-08 – 2023-12-11 (×6): 10 mg
  Filled 2023-12-08 (×6): qty 1

## 2023-12-08 MED ORDER — LACTATED RINGERS IV BOLUS
500.0000 mL | Freq: Once | INTRAVENOUS | Status: AC
  Administered 2023-12-08: 500 mL via INTRAVENOUS

## 2023-12-08 NOTE — Progress Notes (Signed)
 EKG and echo without ischemic changes.Troponin not elevated.  She has become hypothermic. Bear hugger placed.  Will order PICC line for IV access incase she has vasopressor increase.  Repeating ABG at 6:30pm to evaluate recent vent changes.  Check lactic acid  Adding vancomycin for concern of sepsis.  Melody Comas, MD [facility] Office: [phone]   See Amion for personal pager PCCM on call pager [phone] until 7pm. Please call Elink 7p-7a. [phone]

## 2023-12-08 NOTE — Progress Notes (Signed)
 Physical Therapy Treatment Patient Details Name: Jenny White MRN: [id number] DOB: 1946/08/20 Today's Date: 12/08/2023   History of Present Illness 78 yo female presenting with n/v on 3/4. CT(+) SBO. 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 Trach placed 3/21. 3/25 Transfer to ICU and placed on Vent. PMH COPD, GERD, nephrolithiasis, HTN, OSA, lung CA (05/2023) s/p radiation, arthritis, spinal stenosis L3-4 fusion.    PT Comments  Pt lethargic and only following occasional 1 step commands. Unable to maintain arousal for any significant length of time. Will continue to work on strength, mobility, and balance. Plan is for pt to continue therapy at [facility].     If plan is discharge home, recommend the following: Two people to help with walking and/or transfers;Two people to help with bathing/dressing/bathroom   Can travel by private vehicle     No  Equipment Recommendations       Recommendations for Other Services       Precautions / Restrictions Precautions Precautions: Fall;Other (comment) Recall of Precautions/Restrictions: Impaired Precaution/Restrictions Comments: ostomy, trach, midline abd incision, cortrak Restrictions Weight Bearing Restrictions Per Provider Order: No     Mobility  Bed Mobility               General bed mobility comments: Placed bed in chair position    Transfers                        Ambulation/Gait                   Stairs             Wheelchair Mobility     Tilt Bed    Modified Rankin (Stroke Patients Only)       Balance                                            Communication Communication Communication: Impaired Factors Affecting Communication: Trach/intubated  Cognition Arousal: Lethargic Behavior During Therapy: Flat affect   PT - Cognitive impairments: Difficult to assess Difficult to assess due to:  Tracheostomy                     PT - Cognition Comments: Pt only followed occasional 1 step command. Lethargic with only brief arousal with stimulation. Following commands: Impaired Following commands impaired: Follows one step commands inconsistently    Cueing Cueing Techniques: Verbal cues, Tactile cues  Exercises General Exercises - Upper Extremity Shoulder Flexion: PROM, 10 reps, Supine, Both Elbow Flexion: PROM, 10 reps, Supine, Both Elbow Extension: PROM, 10 reps, Supine, Both Wrist Flexion: PROM, 10 reps, Supine, Both Wrist Extension: PROM, 10 reps, Supine, Both General Exercises - Lower Extremity Ankle Circles/Pumps: PROM, 10 reps, Supine, Both Short Arc Quad: PROM, 10 reps, Both, Supine Heel Slides: PROM, 10 reps, Both, Supine Hip ABduction/ADduction: PROM, 10 reps, Both, Supine    General Comments General comments (skin integrity, edema, etc.): VSS on trach collar. SpO2 96%, HR 60's, RR24-29      Pertinent Vitals/Pain Pain Assessment Pain Assessment: Faces Faces Pain Scale: No hurt    Home Living                          Prior Function  PT Goals (current goals can now be found in the care plan section) Acute Rehab PT Goals Patient Stated Goal: not stated Progress towards PT goals: Not progressing toward goals - comment (lethargy)    Frequency    Min 2X/week      PT Plan      Co-evaluation              AM-PAC PT "6 Clicks" Mobility   Outcome Measure    Help needed moving from lying on your back to sitting on the side of a flat bed without using bedrails?: Total Help needed moving to and from a bed to a chair (including a wheelchair)?: Total Help needed standing up from a chair using your arms (e.g., wheelchair or bedside chair)?: Total Help needed to walk in hospital room?: Total Help needed climbing 3-5 steps with a railing? : Total 6 Click Score: 5    End of Session Equipment Utilized During Treatment:  Oxygen Activity Tolerance: Patient limited by lethargy Patient left: in bed;with call bell/phone within reach;with nursing/sitter in room Nurse Communication: Mobility status PT Visit Diagnosis: Other abnormalities of gait and mobility (R26.89);Difficulty in walking, not elsewhere classified (R26.2);Muscle weakness (generalized) (M62.81)     Time: 8295-6213 PT Time Calculation (min) (ACUTE ONLY): 17 min  Charges:    $Therapeutic Exercise: 8-22 mins PT General Charges $$ ACUTE PT VISIT: 1 Visit                     [facility] PT Acute Rehabilitation Services Office [phone]    Angelina Ok [facility] 12/08/2023, 11:42 AM

## 2023-12-08 NOTE — Progress Notes (Signed)
 RT attempted ABG x2 unsuccessfully. Second RT coming to attempt ABG now.

## 2023-12-08 NOTE — Progress Notes (Signed)
 NAME:  Jenny White, MRN:  [id number], DOB:  1946-03-27, LOS: 27 ADMISSION DATE:  11/11/2023, CONSULTATION DATE:  11/11/2023 REFERRING MD:  Dwain Sarna - CCS CHIEF COMPLAINT: Shock    History of Present Illness:  Jenny White 78 year old woman with a pmhx significant for  who presented to the emergency room with complaints of abdominal pain with N/V for the past week. She states the abdominal  pain got worsened after dinner. She reported experiencing nausea and  vomiting this morning.    In the ED  her labs were significant for a lactate 4.1 After fluid resuscitation, Lactate decreased to 2.5. CT scan showed high grade small bowel obstruction with transition point in the right lower quadrant along the distal ileum. Some trace of ascites and mesenteric stranding.Concern for bowel wall ischemia. Broad spectrum antibiotics ( zosyn) was started, blood cultures pending.    Patient was taken to the ER emergently with CCS 3/4 for Ex-lap / LOA. Intraoperative course  was notable for free fluid on entry, with closed loop bowel obstruction noted secondary to tight band adhesion. Patient left in discontinuity/ anastomosis not completed with open abdomen and plan for second look in 24-48 hrs. Patient remained intubated and was transferred to ICU in critical condition.   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 Elevated LA found to have SBO taken emergently to surgery for EX-LAP.  3/7 extubated reintubated 3/10 extubated again 3/11 OFF NIPPV 3/12 got ativan at HS. Sleepy and difficult to arouse all day.  3/13 transferred to ICU for obtundation. Re-intubated.  3/16 Extubated  3/17 zyprexa x 1 overnight,  Obtunded , failed  BiPAP, reintubated 3/17 several conversations with unhappy family members, offered transfer to different facility, called [facility]>> no beds 3/18 resumed BuSpar, gabapentin, stopped oxycodone & fent gtt since oversedated 3/19 Amiodarone IV extravasated LUE 3/20 dex, low dose NE, diuresing  3/21 tracheostomy placed at bedside 3/24 completed antibitoic therapy for aeromonal and hafnia alvei pneumonia with meropenem for 14 days 3/25 transferred to for ongoing care - back on ventilatory support  Interim History / Subjective:  Ostomy output over last 24 hours. Mildly anxious this morning during pre-attending rounds. Denied pain, discomfort.   On re-evaluation with Dr. Francine Graven, patient had a change in mental status while on TCT. MAP 56. Patient was placed back on full ventilatory support with mild improvement. No secretions noted on suctioning. Reviewed medications, and changed made were increased Buspar to 10 mg TID. She received one dose of xanax ~midnight and one prior to that earlier in the day.   Initial ABG at bedside with CO2 75. Once on ventilatory support, patient was following commands and more alert. MAP improving as well. However, after 1HR on ventilatory support, patient with worsening bradycardia, hypotension, and   Objective   Blood pressure (!) 90/38, pulse 65, temperature (!) 96.3 F (35.7 C), temperature source Axillary, resp. rate (!) 27, height 5\' 7"  (1.702 m), weight 47.7 kg, SpO2 96%.    Vent Mode: Stand-by FiO2 (%):  [35 %-40 %] 35 % Set Rate:  [18 bmp] 18 bmp Vt Set:  [420 mL] 420 mL PEEP:  [5 cmH20] 5 cmH20 Plateau Pressure:  [19 cmH20] 19 cmH20  Intake/Output Summary (Last 24 hours) at 12/08/2023 1220 Last data filed at 12/08/2023 1100 Gross per 24 hour  Intake 1825 ml  Output 2825 ml  Net -1000 ml   Filed Weights   12/05/23 0500 12/06/23 0500 12/07/23 0500  Weight: 44.9 kg 50 kg 47.7 kg    Examination: General: encephalopathic in bed Lungs: Tach  in place. Gag reflex, cough response. Decreased air movement bilaterally CV: RRR Abdomen: non tender non distended. Ostomy site without drainage or erythema Extremities: L arm with edema without signs of infiltration. 2+ pulses Neuro: initially awake, interactive, voluntary movements ~7AM; somnolent, awake to sternal rub. Pupils equal and reactive.  GU: Foley catheter  Resolved Hospital Problem list   Hypotension - not longer on midodrine encephalopathy  Assessment & Plan:   Shock Bradycardia, improving Leukocytosis Bradycardia with hypotension. Patient was also hypercarbic after TCT this AM. Multifactorial, medication induced s.p Xanax to which patient had had an adverse reaction to before with encephalopathy. Dose of Buspar increased overnight with potential contribution. Recently treated for aeromonas and Hafnia pneumonia with worsening leukocytosis now and new lower lobe infiltrates on XR today. Normal TSH this admission - Normal  - S/p 1L bolus - On levophed for vasopressor support - EKG with sinus bradycardia; follow up troponin and TTE - Repeat ABG to monitor for acidosis; increase RR on vent  - Adding meropenem for abx coverage;  Follow up tracheal aspirate, blood cultures, and U/A  Follow up procalcitonin - Check AM cortisol - Stop Xanax; add to allergen list - Neuro checks q4  Acute on chronic resp failure with hypoxia, hypercarbia Severe COPD -FEV1 30% RLL Cancer s/p SBRT S/p tracheostomy 3/21 ETT 3/4 >>3/7 >> 3/10, 3/13- 3/16, 3/17 >>3/21 -3/25 -Continue pressure-point ventilation as she can tolerate -Work to transition to ATC if able, PS trial today -Routine trach care -VAP prevention orders -Guaifenesin scheduled -Pulmonary hygiene, PT/OT -Careful with sedating medications -Follow intermittent chest x-ray -Low-dose Xanax available for anxiety component to her tachypnea, work of breathing  High grade small bowel obstruction  Severe protein calorie  malnutrition 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 nutrition once able to get to ATC -Appreciate surgery recommendations, fiber, loperamide, imodium - Currently ostomy output is a barrier for discharge - Per pharmacy, patient with high dose of imodium; will need to down titrate to safe levels - Qtc monitoring - Simethicone added for abdominal discomfort, bloating 3/28  Anemia -Following CBC -Goal hemoglobin 7.0 -Normal ferritin, sat ratio 6 -Follow for any evidence of active bleeding, none noted in ileostomy output - Continue iron supplementation  Severe anxiety at baseline -Gabapentin -DecreaseBuSpar BID  -No benzodiazepines for this patient  HAP:  Aeromonas and Hafnia Alvei pneumonia > both S to meropenmem -Treated, completed 3/24    New onset paroxymal atrial fibrillation - NSR. May be secondary to acute illness - echo 3/6 EF 60-65%, G1DD, mildly reduced RVSF, imaging difficult -Continue Eliquis for now, note anemia.  Will need to consider anticoagulation if hemoglobin continues to drop -Optimize electrolytes  Hypernatremia, resolved Hyponatremia - likely in setting of free trial and increase tube feeds  - Follow BMP  Diabetes type 2  -Following off SSI -CBGs with tube feeds    Amiodarone extravasation in left arm  s/p hyaluronidase injection -Limb elevation, wound care   Deconditioning -PT/OT -Plan to [facility] once ostomy output close to 1 L  daily  Best Practice (right click and "Reselect all SmartList Selections" daily)   Diet/type: tubefeeds DVT prophylaxis: DOAC GI prophylaxis: N/A Lines: Central line Foley:  Yes, and it is still needed Code Status:  full code Last date of multidisciplinary goals of care discussion [--]  Labs   CBC: Recent Labs  Lab 12/04/23 0416 12/05/23 0508 12/06/23 0523 12/06/23 1517 12/07/23 0505  12/08/23 0343  WBC 14.3* 15.0* 15.5*  --  15.3* 16.8*  HGB 7.1* 7.6* 6.7* 7.9* 7.9* 8.0*  HCT 23.7* 24.8* 21.8* 26.0* 25.7* 26.1*  MCV 97.5 96.5 97.3  --  96.3 98.9  PLT 374 416* 377  --  386 377    Basic Metabolic Panel: Recent Labs  Lab 12/04/23 0416 12/05/23 0508 12/06/23 0523 12/07/23 0505 12/08/23 0343  NA 140 139 136 134* 134*  K 4.2 3.7 3.8 4.6 4.8  CL 103 103 102 103 102  CO2 30 28 26 27 27   GLUCOSE 118* 112* 127* 106* 112*  BUN 32* 30* 39* 50* 61*  CREATININE 0.61 0.51 0.54 0.62 0.69  CALCIUM 9.0 8.8* 9.0 9.2 9.5  MG 1.6* 2.1 1.9 2.0 2.0  PHOS 2.6 3.4 3.5 2.9 3.6   GFR: Estimated Creatinine Clearance: 43.6 mL/min (by C-G formula based on SCr of 0.69 mg/dL). Recent Labs  Lab 12/05/23 0508 12/06/23 0523 12/07/23 0505 12/08/23 0343  WBC 15.0* 15.5* 15.3* 16.8*    Liver Function Tests: Recent Labs  Lab 12/05/23 0508 12/06/23 0523 12/07/23 0505 12/08/23 0343  ALBUMIN 2.2* 2.0* 2.1* 2.1*   No results for input(s): "LIPASE", "AMYLASE" in the last 168 hours. No results for input(s): "AMMONIA" in the last 168 hours.  ABG    Component Value Date/Time   PHART 7.328 (L) 11/24/2023 1348   PCO2ART 60.6 (H) 11/24/2023 1348   PO2ART 111 (H) 11/24/2023 1348   HCO3 31.6 (H) 11/24/2023 1348   TCO2 33 (H) 11/24/2023 1348   ACIDBASEDEF 1.0 11/17/2023 1333   O2SAT 98 11/24/2023 1348     Coagulation Profile: No results for input(s): "INR", "PROTIME" in the last 168 hours.  Cardiac Enzymes: No results for input(s): "CKTOTAL", "CKMB", "CKMBINDEX", "TROPONINI" in the last 168 hours.  HbA1C: Hgb A1c MFr Bld  Date/Time Value Ref Range Status  11/14/2023 03:35 PM 5.8 (H) 4.8 - 5.6 % Final    Comment:    (NOTE) Pre diabetes:          5.7%-6.4%  Diabetes:              >6.4%  Glycemic control for   <7.0% adults with diabetes     CBG: Recent Labs  Lab 12/07/23 1122 12/07/23 1728 12/07/23 2357 12/08/23 0615 12/08/23 1201  GLUCAP 121* 105* 112* 122*  91     Critical care time:     Morene Crocker, MD [facility] Internal Medicine Program - PGY-2 12/08/2023, 12:20 PM

## 2023-12-08 NOTE — Progress Notes (Signed)
 RT obtained ABG and reported results to MD: 7.15/71.2/115/25.4  MD increased tidal volume to 490 and RR to 24.   RT attempted to get sputum at this time without success. RT left sputum trap in line. RN aware.   RT will continue to monitor.

## 2023-12-08 NOTE — Progress Notes (Signed)
 RRT placed pt on 35% 10L ATC at this time, pt tolerating well, RN aware. RRT will continue to monitor.

## 2023-12-08 NOTE — Consult Note (Addendum)
 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 They are superficial, no mucous detachment. No signs of inflammatory process. Peristomal assessment: some maceration, not bag leaking.  Treatment options for stomal/peristomal skin:  2" ring # [id number]   Output 100 ml on the bag at 0900. Brown, liquid. Ostomy pouching: 1pc. [id number] and ring [id number] Education provided: Change bag MON and THURS. Clean the skin with saline, pat dry. Cut the barrier as the size and shape as the ostomy (40 mm x 28 mm) Remove the plastic protection, Apply the ring surrounding the cut on the barrier, apply on the skin. Remove the tape protection. Close the lock in roll system on the ostomy bag. Empty when is 1/3 full.   Enrolled patient in [facility] [employer] DC program: YES (03/31) The WOC team will follow her on Thursday for ostomy teaching.    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 Drainage (amount, consistency, odor) Minimum amount, no odor, serous. Periwound: intact. Dressing procedure/placement/frequency: Cleanse sacrum/buttocks/coccyx with Vashe wound cleanser [facility] [place] [phone])  Apply Medihoney daily on the wound bed. Cover with foam dressing, change every 3 days or PRN.  The WOC team will follow her weekly for the sacral wound. Please reconsult if further assistance is needed. Thank-you,  Denyse Amass BSN, RN, [employer], WOC  (Pager: [phone])

## 2023-12-08 NOTE — Progress Notes (Signed)
 Echocardiogram 2D Echocardiogram has been performed.  Warren Lacy Seirra Kos RDCS 12/08/2023, 3:50 PM  Dr. Royann Shivers notified of stat

## 2023-12-08 NOTE — Progress Notes (Signed)
 RRT obtained ABG & reported results to MD. Results as follows: 7.17/71.1/59/26.4 No new orders for RRT at this time, pt back on vent.

## 2023-12-08 NOTE — Progress Notes (Signed)
 25 Days Post-Op   Subjective/Chief Complaint: Alert and responsive   Objective: Vital signs in last 24 hours: Temp:  [96.2 F (35.7 C)-97.5 F (36.4 C)] 96.3 F (35.7 C) (03/31 0750) Pulse Rate:  [55-78] 60 (03/31 0600) Resp:  [16-28] 24 (03/31 0600) BP: (90-111)/(39-49) 93/43 (03/31 0600) SpO2:  [100 %] 100 % (03/31 0745) FiO2 (%):  [40 %] 40 % (03/31 0745) Last BM Date : 12/07/23  Intake/Output from previous day: 03/30 0701 - 03/31 0700 In: 1905 [NG/GT:1905] Out: 2900 [Urine:1925; Stool:975] Intake/Output this shift: No intake/output data recorded.  Ab soft nontender ileostomy productive  Lab Results:  Recent Labs    12/07/23 0505 12/08/23 0343  WBC 15.3* 16.8*  HGB 7.9* 8.0*  HCT 25.7* 26.1*  PLT 386 377   BMET Recent Labs    12/07/23 0505 12/08/23 0343  NA 134* 134*  K 4.6 4.8  CL 103 102  CO2 27 27  GLUCOSE 106* 112*  BUN 50* 61*  CREATININE 0.62 0.69  CALCIUM 9.2 9.5   PT/INR No results for input(s): "LABPROT", "INR" in the last 72 hours. ABG No results for input(s): "PHART", "HCO3" in the last 72 hours.  Invalid input(s): "PCO2", "PO2"  Studies/Results: No results found.  Anti-infectives: Anti-infectives (From admission, onward)    Start     Dose/Rate Route Frequency Ordered Stop   11/25/23 1145  meropenem (MERREM) 1 g in sodium chloride 0.9 % 100 mL IVPB        1 g 200 mL/hr over 30 Minutes Intravenous Every 12 hours 11/25/23 1045 12/01/23 2100   11/20/23 1045  tobramycin (PF) (TOBI) nebulizer solution 300 mg  Status:  Discontinued        300 mg Nebulization 2 times daily 11/20/23 0952 11/23/23 1133   11/17/23 1000  meropenem (MERREM) 1 g in sodium chloride 0.9 % 100 mL IVPB  Status:  Discontinued        1 g 200 mL/hr over 30 Minutes Intravenous Every 12 hours 11/17/23 0832 11/23/23 1133   11/15/23 1400  piperacillin-tazobactam (ZOSYN) IVPB 3.375 g  Status:  Discontinued        3.375 g 100 mL/hr over 30 Minutes Intravenous Every 8  hours 11/15/23 1223 11/17/23 0832   11/11/23 2000  piperacillin-tazobactam (ZOSYN) IVPB 3.375 g  Status:  Discontinued        3.375 g 12.5 mL/hr over 240 Minutes Intravenous Every 8 hours 11/11/23 1233 11/15/23 1223   11/11/23 1245  piperacillin-tazobactam (ZOSYN) IVPB 3.375 g        3.375 g 100 mL/hr over 30 Minutes Intravenous  Once 11/11/23 1233 11/11/23 1316       Assessment/Plan: POD 26/24 S/P ex-lap small bowel resection, left open; ex-lap, small bowel resection, ileostomy and closure, Dr. Dwain Sarna for SBO - ileostomy output 975 ml/24h - Continue fiber - and AD. I decreased loperamide today - continue TFs.  Prealbumin 19.  No needs for TPN - per WOC RN abnormality of stoma noted during pouch change this am - stoma not able to be completely evaluated with one piece in place. However stoma is pink, viable, and functional. Will check stoma next pouch change, but even if she has mucosal hyperplasia or fistula, nothing to do about this currently. Please call our team when changing pouch - Remainder of care per CCM.  -will see again later this week, please call if needed   FEN: post pyloric Tfs (continue post pyloric due to aspiration risk) ID: none VTE: eliquis bid  Emelia Loron 12/08/2023

## 2023-12-08 NOTE — Progress Notes (Signed)
 Around 1100-1200, this RN and student RN, Tobi Bastos, assessed patient. The patient was on trach collar and vital signs were stable, but the patient was not responsive to voice nor to sternal rub. Eyes were partially open, although the patient would not track my finger. She was unable to follow commands as well. Her skin color was pale. MD Francine Graven and MD Lily Kocher were made aware and saw patient at the bedside. Patient was placed on trach vent again. See new orders.

## 2023-12-08 NOTE — Progress Notes (Addendum)
 Pharmacy Antibiotic Note  Jenny White is a 78 y.o. female admitted on 11/11/2023 with pneumonia.  Pharmacy has been consulted for Meropenem dosing.  Plan: Meropenem 1gm IV q12h Will f/u renal function, micro data, and pt's clinical condition  ADDENDUM (1820): Adding vancomycin coverage.  Vancomycin1000mg  IV now then  750 mg IV Q 24 hrs. Goal AUC 400-550. Expected AUC: 537 SCr used: 0.8 Will f/u renal function, micro data, and pt's clinical condition Vanc levels prn  Height: 5\' 7"  (170.2 cm) Weight: 47.7 kg (105 lb 2.6 oz) IBW/kg (Calculated) : 61.6  Temp (24hrs), Avg:96.7 F (35.9 C), Min:96.2 F (35.7 C), Max:97.5 F (36.4 C)  Recent Labs  Lab 12/04/23 0416 12/05/23 0508 12/06/23 0523 12/07/23 0505 12/08/23 0343  WBC 14.3* 15.0* 15.5* 15.3* 16.8*  CREATININE 0.61 0.51 0.54 0.62 0.69    Estimated Creatinine Clearance: 43.6 mL/min (by C-G formula based on SCr of 0.69 mg/dL).    No Known Allergies  Antimicrobials this admission: Zosyn 3/4 >> 3/10 Meropenem 3/11 >> 3/16, 3/18 >> 3/24; restart 3/31 >> Tobramycin nebs 3/13 >>3/16 Topical miconazole 3/22 >> 4/1  Vancomycin 3/31 >>  Microbiology results: 3/31: Ucx: 3/31 BCx:  3/31 Trach asp: 3/31 MRSA PCR: 3/13 BAL: neg 3/9 Trach asp : Aeromonas, Hafnia alvei (S imipenem)  3/4 Bcx : ngtd   3/4 MRSA : neg   Thank you for allowing pharmacy to be a part of this patient's care.  Christoper Fabian, PharmD, BCPS Please see amion for complete clinical pharmacist phone list 12/08/2023 3:18 PM

## 2023-12-09 ENCOUNTER — Inpatient Hospital Stay (HOSPITAL_COMMUNITY)

## 2023-12-09 DIAGNOSIS — R609 Edema, unspecified: Secondary | ICD-10-CM

## 2023-12-09 LAB — CBC WITH DIFFERENTIAL/PLATELET
Abs Immature Granulocytes: 0.62 10*3/uL — ABNORMAL HIGH (ref 0.00–0.07)
Basophils Absolute: 0.1 10*3/uL (ref 0.0–0.1)
Basophils Relative: 1 %
Eosinophils Absolute: 0.4 10*3/uL (ref 0.0–0.5)
Eosinophils Relative: 2 %
HCT: 25 % — ABNORMAL LOW (ref 36.0–46.0)
Hemoglobin: 7.8 g/dL — ABNORMAL LOW (ref 12.0–15.0)
Immature Granulocytes: 4 %
Lymphocytes Relative: 4 %
Lymphs Abs: 0.7 10*3/uL (ref 0.7–4.0)
MCH: 30.2 pg (ref 26.0–34.0)
MCHC: 31.2 g/dL (ref 30.0–36.0)
MCV: 96.9 fL (ref 80.0–100.0)
Monocytes Absolute: 1.8 10*3/uL — ABNORMAL HIGH (ref 0.1–1.0)
Monocytes Relative: 11 %
Neutro Abs: 13 10*3/uL — ABNORMAL HIGH (ref 1.7–7.7)
Neutrophils Relative %: 78 %
Platelets: 359 10*3/uL (ref 150–400)
RBC: 2.58 MIL/uL — ABNORMAL LOW (ref 3.87–5.11)
RDW: 15.8 % — ABNORMAL HIGH (ref 11.5–15.5)
WBC: 16.6 10*3/uL — ABNORMAL HIGH (ref 4.0–10.5)
nRBC: 0.2 % (ref 0.0–0.2)

## 2023-12-09 LAB — POTASSIUM
Potassium: 5.8 mmol/L — ABNORMAL HIGH (ref 3.5–5.1)
Potassium: 5.1 mmol/L (ref 3.5–5.1)
Potassium: 5.3 mmol/L — ABNORMAL HIGH (ref 3.5–5.1)
Potassium: 5.1 mmol/L (ref 3.5–5.1)

## 2023-12-09 LAB — RENAL FUNCTION PANEL
Albumin: 2 g/dL — ABNORMAL LOW (ref 3.5–5.0)
Albumin: 3.1 g/dL — ABNORMAL LOW (ref 3.5–5.0)
Anion gap: 7 (ref 5–15)
Anion gap: 8 (ref 5–15)
BUN: 76 mg/dL — ABNORMAL HIGH (ref 8–23)
BUN: 82 mg/dL — ABNORMAL HIGH (ref 8–23)
CO2: 23 mmol/L (ref 22–32)
CO2: 22 mmol/L (ref 22–32)
Calcium: 9.3 mg/dL (ref 8.9–10.3)
Calcium: 9.2 mg/dL (ref 8.9–10.3)
Chloride: 98 mmol/L (ref 98–111)
Chloride: 96 mmol/L — ABNORMAL LOW (ref 98–111)
Creatinine, Ser: 0.87 mg/dL (ref 0.44–1.00)
Creatinine, Ser: 0.87 mg/dL (ref 0.44–1.00)
GFR, Estimated: >60 mL/min (ref >60)
GFR, Estimated: >60 mL/min (ref >60)
Glucose, Bld: 83 mg/dL (ref 70–99)
Glucose, Bld: 142 mg/dL — ABNORMAL HIGH (ref 70–99)
Phosphorus: 3.7 mg/dL (ref 2.5–4.6)
Phosphorus: 3.2 mg/dL (ref 2.5–4.6)
Potassium: 5.7 mmol/L — ABNORMAL HIGH (ref 3.5–5.1)
Potassium: 5.2 mmol/L — ABNORMAL HIGH (ref 3.5–5.1)
Sodium: 128 mmol/L — ABNORMAL LOW (ref 135–145)
Sodium: 126 mmol/L — ABNORMAL LOW (ref 135–145)

## 2023-12-09 LAB — MAGNESIUM: Magnesium: 1.9 mg/dL (ref 1.7–2.4)

## 2023-12-09 LAB — GLUCOSE, CAPILLARY
Glucose-Capillary: 101 mg/dL — ABNORMAL HIGH (ref 70–99)
Glucose-Capillary: 55 mg/dL — ABNORMAL LOW (ref 70–99)
Glucose-Capillary: 64 mg/dL — ABNORMAL LOW (ref 70–99)
Glucose-Capillary: 82 mg/dL (ref 70–99)
Glucose-Capillary: 90 mg/dL (ref 70–99)
Glucose-Capillary: 98 mg/dL (ref 70–99)

## 2023-12-09 LAB — CORTISOL-AM, BLOOD: Cortisol - AM: 15.5 ug/dL (ref 6.7–22.6)

## 2023-12-09 MED ORDER — BANATROL TF EN LIQD
60.0000 mL | Freq: Three times a day (TID) | ENTERAL | Status: DC
  Administered 2023-12-09 – 2023-12-11 (×7): 60 mL
  Filled 2023-12-09 (×7): qty 60

## 2023-12-09 MED ORDER — MAGNESIUM SULFATE 2 GM/50ML IV SOLN
2.0000 g | Freq: Once | INTRAVENOUS | Status: AC
  Administered 2023-12-09: 2 g via INTRAVENOUS
  Filled 2023-12-09: qty 50

## 2023-12-09 MED ORDER — DEXTROSE 50 % IV SOLN
12.5000 g | INTRAVENOUS | Status: AC
  Administered 2023-12-09: 12.5 g via INTRAVENOUS
  Filled 2023-12-09: qty 50

## 2023-12-09 MED ORDER — SCOPOLAMINE 1 MG/3DAYS TD PT72
1.0000 | MEDICATED_PATCH | TRANSDERMAL | Status: DC
  Administered 2023-12-09: 1.5 mg via TRANSDERMAL
  Filled 2023-12-09: qty 1

## 2023-12-09 MED ORDER — PROCHLORPERAZINE EDISYLATE 10 MG/2ML IJ SOLN
10.0000 mg | Freq: Once | INTRAMUSCULAR | Status: AC
  Administered 2023-12-09: 10 mg via INTRAVENOUS
  Filled 2023-12-09: qty 2

## 2023-12-09 MED ORDER — SODIUM ZIRCONIUM CYCLOSILICATE 10 G PO PACK
10.0000 g | PACK | Freq: Once | ORAL | Status: AC
  Administered 2023-12-09: 10 g
  Filled 2023-12-09: qty 1

## 2023-12-09 MED ORDER — SODIUM ZIRCONIUM CYCLOSILICATE 10 G PO PACK: 10.0000 g | PACK | Freq: Once | ORAL | Status: DC

## 2023-12-09 MED ORDER — METOCLOPRAMIDE HCL 5 MG/ML IJ SOLN
5.0000 mg | Freq: Once | INTRAMUSCULAR | Status: AC
  Administered 2023-12-09: 5 mg via INTRAVENOUS
  Filled 2023-12-09: qty 2

## 2023-12-09 MED ORDER — DEXTROSE 50 % IV SOLN
1.0000 | Freq: Once | INTRAVENOUS | Status: AC
  Administered 2023-12-09: 50 mL via INTRAVENOUS
  Filled 2023-12-09: qty 50

## 2023-12-09 MED ORDER — ALBUTEROL SULFATE (2.5 MG/3ML) 0.083% IN NEBU
10.0000 mg | INHALATION_SOLUTION | Freq: Once | RESPIRATORY_TRACT | Status: AC
  Administered 2023-12-09: 10 mg via RESPIRATORY_TRACT
  Filled 2023-12-09: qty 12

## 2023-12-09 MED ORDER — INSULIN ASPART 100 UNIT/ML IV SOLN
10.0000 [IU] | Freq: Once | INTRAVENOUS | Status: AC
  Administered 2023-12-09: 10 [IU] via INTRAVENOUS

## 2023-12-09 MED ORDER — SODIUM CHLORIDE 0.9 % IV BOLUS
500.0000 mL | Freq: Once | INTRAVENOUS | Status: AC
  Administered 2023-12-09: 500 mL via INTRAVENOUS

## 2023-12-09 MED ORDER — PROSOURCE TF20 ENFIT COMPATIBL EN LIQD
60.0000 mL | Freq: Every day | ENTERAL | Status: DC
  Administered 2023-12-09 – 2023-12-11 (×3): 60 mL
  Filled 2023-12-09 (×3): qty 60

## 2023-12-09 MED ORDER — VITAL 1.5 CAL PO LIQD
1000.0000 mL | ORAL | Status: DC
  Administered 2023-12-09 – 2023-12-10 (×2): 1000 mL

## 2023-12-09 MED ORDER — ALBUMIN HUMAN 25 % IV SOLN
25.0000 g | Freq: Four times a day (QID) | INTRAVENOUS | Status: AC
  Administered 2023-12-09 (×3): 25 g via INTRAVENOUS
  Filled 2023-12-09 (×3): qty 100

## 2023-12-09 NOTE — Plan of Care (Signed)
  Problem: Clinical Measurements: Goal: Will remain free from infection Outcome: Progressing Goal: Cardiovascular complication will be avoided Outcome: Progressing   Problem: Activity: Goal: Risk for activity intolerance will decrease Outcome: Progressing   Problem: Coping: Goal: Level of anxiety will decrease Outcome: Progressing   Problem: Safety: Goal: Ability to remain free from injury will improve Outcome: Progressing   Problem: Activity: Goal: Ability to tolerate increased activity will improve Outcome: Progressing

## 2023-12-09 NOTE — Progress Notes (Signed)
 eLink Physician-Brief Progress Note Patient Name: Jenny White DOB: 01-21-1946 MRN: [id number]   Date of Service  12/09/2023  HPI/Events of Note  Hyperkalemia 5.7  eICU Interventions  Albuterol, dextrose, insulin, loklema x 1 NS bolus x 500 cc     Intervention Category Minor Interventions: Electrolytes abnormality - evaluation and management  Autry Droege Mechele Collin 12/09/2023, 6:52 AM

## 2023-12-09 NOTE — Progress Notes (Signed)
 NAME:  Jenny White, MRN:  [id number], DOB:  1946-05-20, LOS: 28 ADMISSION DATE:  11/11/2023, CONSULTATION DATE:  11/11/2023 REFERRING MD:  Dwain Sarna - CCS CHIEF COMPLAINT: Shock    History of Present Illness:  Jenny White 78 year old woman with a pmhx significant for  who presented to the emergency room with complaints of abdominal pain with N/V for the past week. She states the abdominal  pain got worsened after dinner. She reported experiencing nausea and  vomiting this morning.    In the ED  her labs were significant for a lactate 4.1 After fluid resuscitation, Lactate decreased to 2.5. CT scan showed high grade small bowel obstruction with transition point in the right lower quadrant along the distal ileum. Some trace of ascites and mesenteric stranding.Concern for bowel wall ischemia. Broad spectrum antibiotics ( zosyn) was started, blood cultures pending.    Patient was taken to the ER emergently with CCS 3/4 for Ex-lap / LOA. Intraoperative course  was notable for free fluid on entry, with closed loop bowel obstruction noted secondary to tight band adhesion. Patient left in discontinuity/ anastomosis not completed with open abdomen and plan for second look in 24-48 hrs. Patient remained intubated and was transferred to ICU in critical condition.   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 Elevated LA found to have SBO taken emergently to surgery for EX-LAP.  3/7 extubated reintubated 3/10 extubated again 3/11 OFF NIPPV 3/12 got ativan at HS. Sleepy and difficult to arouse all day.  3/13 transferred to ICU for obtundation. Re-intubated.  3/16 Extubated  3/17 zyprexa x 1 overnight,  Obtunded , failed  BiPAP, reintubated 3/17 several conversations with unhappy family members, offered transfer to different facility, called [facility]>> no beds 3/18 resumed BuSpar, gabapentin, stopped oxycodone & fent gtt since oversedated 3/19 Amiodarone IV extravasated LUE 3/20 dex, low dose NE, diuresing  3/21 tracheostomy placed at bedside 3/24 completed antibitoic therapy for aeromonal and hafnia alvei pneumonia with meropenem for 14 days 3/25 transferred to for ongoing care - back on ventilatory support 3/31 shock with hypothermia, bradycardia - c/f septic shock  Interim History / Subjective:  Requiring pressor support today, blood pressure and HR slowly improving. Hyperkalemic overnight. More interactive this AM. No chest pain, dyspnea. Endorsing anxiety this morning and suprapubic tenderness. No increased gas in ostomy pouch.  Stool output 1.29 L  Objective   Blood pressure (!) 102/37, pulse (!) 55, temperature (!) 97.5 F (36.4 C), temperature source Oral, resp. rate 19, height 5\' 7"  (1.702 m), weight 47.7 kg, SpO2 100%.    Vent Mode: PRVC FiO2 (%):  [35 %-40 %] 40 % Set Rate:  [18 bmp-24 bmp] 24 bmp Vt Set:  [420 mL-490 mL] 490 mL PEEP:  [5 cmH20] 5 cmH20 Plateau Pressure:  [22 cmH20-29 cmH20] 29 cmH20   Intake/Output Summary (Last 24 hours) at 12/09/2023 0722 Last data filed at 12/09/2023 0700 Gross per 24 hour  Intake 3858.97 ml  Output 2040 ml  Net 1818.97 ml   Filed Weights   12/06/23 0500 12/07/23 0500 12/09/23 0441  Weight: 50 kg 47.7 kg 47.7 kg    Examination: General: Alert and lying in bed in mild distress Lungs: trach in  place with full ventilator support. Good air movement today without crackles or wheezing Abdomen: Ostomy site without drainage or erythema; mild suprapubic tenderness Extremities: 2+radial and pedal pulses. Non pitting edema in BL upper and lower extremities  Neuro: awake, interacting, following commands, responding to questions appropriately GU: Foley  catheter  Significant labs: Na 134>128 K 5.7>5.8 BUN 61>76 Cr 0.69>0.87 (BL 0.5)  Hgb 9.2>7.8 WBC 16.8>16.6 net 13 Lactic acid: wnl  MRSA PCR: not detected U/A: +hgb, RBCs, leukocytes, and bacteria  Culture: 3/31 urine: pending 3/31 blood:  3/31 Tracheal aspirate: unable to obtain  Imaging: - 3/31 TTE: LVEF 65-70% LV with normal function and no RWMA. Elevated pulmonary artery pressure  Resolved Hospital Problem list   Hypotension - not longer on midodrine encephalopathy  Assessment & Plan:   Shock Hypothermia, bradycardia, and encephalopathy. Etiology likely sepsis in this patient with multiple possible sources. TTE reassuring overnight without RV strain given elevated pulmonary artery pressure. Covering for HAP in setting of CXR changes. U/A with pyuria, bacteriuria, microscopic hematuria and hyphated yeast, concerning for active yeast infection rather than colonization; of note, she has had urethral catheter obstructions in the past week. Despite this, patient is improving with current therapies. Suspect hypovolemia from persistent high ostomy output also contributing - Continue on meropenum and vancomycin 3/31-  MRSA PCR negative, will DC vanc - On levophed for vasopressor support - Follow urine culture; collecting fungal blood culture  If further decline, will add fluconazole - S/p 1.5 fluid - TTE wo WMA; continue telemetry -- Stop Xanax; add to allergen list - Neuro checks q4 - Bladder scan today - Albumin IV 75g; if continues to be hypotensive, will add midodrine  Acute on chronic resp failure with hypoxia, hypercarbia Severe COPD -FEV1 30% RLL Cancer s/p SBRT S/p tracheostomy 3/21 ETT 3/4 >>3/7 >> 3/10, 3/13- 3/16, 3/17 >>3/21 -3/25 -Continue pressure-point ventilation as she can tolerate -Work to transition to Jenny White if able, PS trial today -Routine trach care -VAP prevention orders -Guaifenesin scheduled -Pulmonary hygiene, PT/OT -Careful with sedating  medications -Follow intermittent chest x-ray -Low-dose Xanax available for anxiety component to her tachypnea, work of breathing  Hyperkalemia Suspect daily K on Vivinex daily with ongoing intravascular depletion as the culprit - S/p 500 cc, lokelma, insulin, albuterol - EKG without PR, QRS prolongation, or peaked T waves; hold calcium gluconate - q4HR K  Hyponatremia Evidence of intravascular depletion over past 24 hrs.  - Monitor with volume repletion - Follow BMP  AKI BL 0.5. Progressively increasing in the past week. BUN also 71 today - Follow after fluid resuscitation  High grade small bowel obstruction  Severe protein calorie malnutrition End ileostomy S/p ex lap 3/4 with resection and end ileostomy Ileostomy output 1.29L overnight. Nausea and 1x bilious emesis episode. Repeat KUB unchanged; feeding tube in mid-duodenum - Continue tube feeding; will discuss with surgery fiber and tube feed formula given high content of K per day (~42mEq of K) - Appreciate surgery recommendations, fiber, loperamide, imodium - Currently ostomy output is a barrier for discharge; need <1L per [facility] - Currently on hig dose imodium; will need to down titrate to safe levels - Qtc monitoring - Simethicone added for abdominal discomfort, bloating 3/28  Anemia - Following CBC - Goal hemoglobin 7.0 - Normal ferritin, sat ratio 6 - No active disease - Continue iron supplementation - If continues to downtrend, will get CT abdomen and pelvis for retroperitoneal bleed  Severe anxiety at baseline -Gabapentin -DecreaseBuSpar BID  -No benzodiazepines  for this patient  HAP:  Aeromonas and Hafnia Alvei pneumonia > both S to meropenmem -Treated, completed 3/24    New onset paroxymal atrial fibrillation - NSR. May be secondary to acute illness - TTE 3/6 & 3/31 normal LVEF and no WMA -on DOAC; may need to reconsider if Hgb continues to drop -Optimize electrolytes  Diabetes type 2   -Following off SSI -CBGs with tube feeds    Deconditioning -PT/OT -Plan to [facility] once ostomy output close to 1 L daily  Best Practice (right click and "Reselect all SmartList Selections" daily)   Diet/type: tubefeeds DVT prophylaxis: DOAC GI prophylaxis: N/A Lines: Central line Foley:  Yes, and it is still needed Code Status:  full code Last date of multidisciplinary goals of care discussion [--]  Labs   CBC: Recent Labs  Lab 12/05/23 0508 12/06/23 0523 12/06/23 1517 12/07/23 0505 12/08/23 0343 12/08/23 1223 12/08/23 1641 12/08/23 1830 12/09/23 0539  WBC 15.0* 15.5*  --  15.3* 16.8*  --   --   --  16.6*  NEUTROABS  --   --   --   --   --   --   --   --  13.0*  HGB 7.6* 6.7*   < > 7.9* 8.0* 9.2* 8.8* 9.2* 7.8*  HCT 24.8* 21.8*   < > 25.7* 26.1* 27.0* 26.0* 27.0* 25.0*  MCV 96.5 97.3  --  96.3 98.9  --   --   --  96.9  PLT 416* 377  --  386 377  --   --   --  359   < > = values in this interval not displayed.    Basic Metabolic Panel: Recent Labs  Lab 12/05/23 0508 12/06/23 0523 12/07/23 0505 12/08/23 0343 12/08/23 1223 12/08/23 1641 12/08/23 1830 12/09/23 0539  NA 139 136 134* 134* 129* 128* 127* 128*  K 3.7 3.8 4.6 4.8 5.2* 5.2* 5.3* 5.7*  CL 103 102 103 102  --   --   --  98  CO2 28 26 27 27   --   --   --  23  GLUCOSE 112* 127* 106* 112*  --   --   --  83  BUN 30* 39* 50* 61*  --   --   --  76*  CREATININE 0.51 0.54 0.62 0.69  --   --   --  0.87  CALCIUM 8.8* 9.0 9.2 9.5  --   --   --  9.3  MG 2.1 1.9 2.0 2.0  --   --   --  1.9  PHOS 3.4 3.5 2.9 3.6  --   --   --  3.7   GFR: Estimated Creatinine Clearance: 40.1 mL/min (by C-G formula based on SCr of 0.87 mg/dL). Recent Labs  Lab 12/06/23 0523 12/07/23 0505 12/08/23 0343 12/08/23 1553 12/08/23 1839 12/08/23 2143 12/09/23 0539  PROCALCITON  --   --   --  <0.10  --   --   --   WBC 15.5* 15.3* 16.8*  --   --   --  16.6*  LATICACIDVEN  --   --   --   --  0.5 0.7  --     Liver Function  Tests: Recent Labs  Lab 12/05/23 0508 12/06/23 0523 12/07/23 0505 12/08/23 0343 12/09/23 0539  ALBUMIN 2.2* 2.0* 2.1* 2.1* 2.0*   No results for input(s): "LIPASE", "AMYLASE" in the last 168 hours. No results for input(s): "AMMONIA" in the last 168 hours.  ABG  Component Value Date/Time   PHART 7.256 (L) 12/08/2023 1830   PCO2ART 54.3 (H) 12/08/2023 1830   PO2ART 121 (H) 12/08/2023 1830   HCO3 25.1 12/08/2023 1830   TCO2 27 12/08/2023 1830   ACIDBASEDEF 3.0 (H) 12/08/2023 1830   O2SAT 98 12/08/2023 1830     Coagulation Profile: No results for input(s): "INR", "PROTIME" in the last 168 hours.  Cardiac Enzymes: No results for input(s): "CKTOTAL", "CKMB", "CKMBINDEX", "TROPONINI" in the last 168 hours.  HbA1C: Hgb A1c MFr Bld  Date/Time Value Ref Range Status  11/14/2023 03:35 PM 5.8 (H) 4.8 - 5.6 % Final    Comment:    (NOTE) Pre diabetes:          5.7%-6.4%  Diabetes:              >6.4%  Glycemic control for   <7.0% adults with diabetes     CBG: Recent Labs  Lab 12/08/23 1201 12/08/23 1817 12/08/23 2333 12/09/23 0339 12/09/23 0623  GLUCAP 91 88 80 90 82     Critical care time:     Morene Crocker, MD [facility] Internal Medicine Program - PGY-2 12/09/2023, 7:22 AM

## 2023-12-09 NOTE — Progress Notes (Signed)
 Nutrition Follow-up  DOCUMENTATION CODES:  Severe malnutrition in context of chronic illness (COPD and lung cancer)  INTERVENTION:  Adjust tube feeding via cortrak as no significant decrease in output was seen after using elemental formula: Vital 1.5 at 50 ml/h (1200 ml per day) Restart at 63mL/h and increase by 10mL q8h to goal Prosource TF20 60 ml 1x/d Provides 1880 kcal, 101 gm protein, 917 ml free water daily Decrease Banatrol to TID. This will provide 15g of soluble fiber and 9.6 mEq of potassium daily (TF+banatrol = 22g fiber daily) Recommend pulling cortrak tube back to pyloric region to increase absorptive surface Recommend restarting TPN as pt has experienced severe weight loss this admission and has also developed wounds. PICC line in place  NUTRITION DIAGNOSIS:  Severe Malnutrition related to chronic illness (COPD and lung cancer) as evidenced by severe muscle depletion, severe fat depletion. - Ongoing.   GOAL:  Patient will meet greater than or equal to 90% of their needs - TF infusing, high output ileostomy, likely not absorbing all nutrition  MONITOR:  Vent status, Labs, TF tolerance, I & O's  REASON FOR ASSESSMENT:  Consult Enteral/tube feeding initiation and management  ASSESSMENT:  78 yo female admitted with mixed shock, septic and hypovolemic, high grade SBO with bowel ischemia. PMH includes COPD, GERD, nephrolithiasis, HTN, OSA, lung cancer (adenocarcinoma RLL dx 05/2023)  3/04 OR: Ex Lap, SB resection, Abthera wound VAC placement, open abdomen with bowel in discontinuity  3/05 PICC line placed 3/06 Return to OR: Re-opening of recent ex lap, SB resection with end ileostomy, abd wall closure and wound VAC remove 3/07 TPN initiated, Extubated, Re-Intubated 3/10 Extubated  3/11 off BiPAP 3/13 re-intubated, transferred to 4N ICU 3/14 Cortrak placed, currently gastric (per x-ray 3/15, Cortrak tube likely in third portion of duodenum) 3/15 Started trickle TF 3/18  TF to goal; wean TPN 3/21 bedside tracheostomy 3/25 transferred to , vent support  Pt remains in ICU on vent support. Worsening hypotension over the last 24 hours. Ileostomy output remains high and is a barrier to discharge to [facility].  Imodium dose was decreased 3/31 and output from ileostomy has increased back to >1L. Do not feel that changing formula to elemental has impacted output. Will adjust formula back to semi-elemental to decrease daily volume being infused. Will also adjust banatrol as pt is currently receiving 12 packets per day and providing a significant amount of K ( ) in addition to copious amounts of fiber. Pt now with hyperkalemia. Currently receiving 60g of fiber which is ~2x the recommended daily intake.   Pt vomited early this AM. Imaging obtained and no signs of obstruction tube noted to be in the descending duodenum. Continue to recommend adjusting tube placement to the pyloric region to give pt more absorptive surface. Discussed with attending, ok to restart feeds. Relayed plan to RN.   MV: 11.9 L/min Temp (24hrs), Avg:96.3 F (35.7 C), Min:93.1 F (33.9 C), Max:97.8 F (36.6 C)  Admit weight: 54.4kg  Current weight: 47.7 kg  Intake/Output Summary (Last 24 hours) at 12/09/2023 1226 Last data filed at 12/09/2023 1104 Gross per 24 hour  Intake 3657.05 ml  Output 1600 ml  Net 2057.05 ml  Net IO Since Admission: 3,504.17 mL [12/09/23 1226]  Drains/Lines: Cortrak (post-pyloric, descending duodenum) PICC triple lumen Tracheostomy Ileostomy: output x 24 hours Urethral Catheter: output  Nutritionally Relevant Medications: Scheduled Meds:  diphenoxylate-atropine  2 tablet Per Tube QID   ferrous sulfate  300 mg Per Tube Daily  BANATROL TF  120 mL Per Tube 6 X Daily   loperamide HCl  4 mg Per Tube QID   JUVEN)  1 packet Per Tube BID BM   pantoprazole IV  40 mg Intravenous Q24H   Continuous Infusions:  meropenem (MERREM) IV Stopped (12/09/23  0421)   norepinephrine (LEVOPHED) Adult infusion 2 mcg/min (12/09/23 0800)   vancomycin     Vivonex RTF Stopped (12/09/23 0400)   PRN Meds:.alum & mag hydroxide-simeth, ondansetron, polyethylene glycol, senna, simethicone  Labs Reviewed: Na 128 K 5.8 BUN 76 CBG ranges from 64-122 mg/dL over the last 24 hours  Diet Order:   Diet Order             Diet NPO time specified  Diet effective now                  EDUCATION NEEDS:  Not appropriate for education at this time  Skin:  Skin Assessment: Skin Integrity Issues: Skin Integrity Issues:: Stage II, DTI, Incisions DTI: coccyx Stage II: L heel, R heel Wound Vac: removed Incisions: abd Other: skin tear: R and L arm  Last BM:  via ileostomy x 24 hours  Height:  Ht Readings from Last 1 Encounters:  12/04/23 5\' 7"  (1.702 m)   Weight:  Wt Readings from Last 1 Encounters:  12/09/23 47.7 kg   Ideal Body Weight:  61.4 kg  BMI:  Body mass index is 16.47 kg/m.  Estimated Nutritional Needs:  Kcal:  1600-1900 kcal/d Protein:  85-105g Fluid:  >/= 2L   Greig Castilla, RD, LDN Registered Dietitian II Please reach out via secure chat

## 2023-12-09 NOTE — Progress Notes (Signed)
 OT Cancellation Note  Patient Details Name: Jenny White MRN: [id number] DOB: March 17, 1946   Cancelled Treatment:    Reason Eval/Treat Not Completed: Medical issues which prohibited therapy (pt nauseated, RN/MD asking to let pt rest today. will follow up next date as schedule permits)  Carver Fila, OTD, OTR/L [facility] [phone]   Dalphine Handing 12/09/2023, 2:19 PM

## 2023-12-09 NOTE — Progress Notes (Signed)
 Hypoglycemic Event  CBG: 64   Treatment: D50 25 mL (12.5 gm)  Symptoms: Shaky  Follow-up CBG: Time:1251 CBG Result:101  Possible Reasons for Event: Inadequate meal intake    Jenny White

## 2023-12-09 NOTE — Plan of Care (Signed)
  Problem: Education: Goal: Knowledge of General Education information will improve Description: Including pain rating scale, medication(s)/side effects and non-pharmacologic comfort measures Outcome: Progressing   Problem: Pain Managment: Goal: General experience of comfort will improve and/or be controlled Outcome: Progressing   Problem: Nutritional: Goal: Maintenance of adequate nutrition will improve Outcome: Progressing   Problem: Education: Goal: Knowledge about tracheostomy care/management will improve Outcome: Progressing   Problem: Health Behavior/Discharge Planning: Goal: Ability to manage tracheostomy will improve Outcome: Progressing   Problem: Respiratory: Goal: Patent airway maintenance will improve Outcome: Progressing

## 2023-12-10 LAB — RENAL FUNCTION PANEL
Albumin: 3 g/dL — ABNORMAL LOW (ref 3.5–5.0)
Anion gap: 5 (ref 5–15)
BUN: 72 mg/dL — ABNORMAL HIGH (ref 8–23)
CO2: 23 mmol/L (ref 22–32)
Calcium: 9.7 mg/dL (ref 8.9–10.3)
Chloride: 105 mmol/L (ref 98–111)
Creatinine, Ser: 0.71 mg/dL (ref 0.44–1.00)
GFR, Estimated: >60 mL/min (ref >60)
Glucose, Bld: 101 mg/dL — ABNORMAL HIGH (ref 70–99)
Phosphorus: 3.2 mg/dL (ref 2.5–4.6)
Potassium: 4.8 mmol/L (ref 3.5–5.1)
Sodium: 133 mmol/L — ABNORMAL LOW (ref 135–145)

## 2023-12-10 LAB — CBC WITH DIFFERENTIAL/PLATELET
Abs Immature Granulocytes: 0.34 10*3/uL — ABNORMAL HIGH (ref 0.00–0.07)
Basophils Absolute: 0.1 10*3/uL (ref 0.0–0.1)
Basophils Relative: 1 %
Eosinophils Absolute: 0.4 10*3/uL (ref 0.0–0.5)
Eosinophils Relative: 3 %
HCT: 23.3 % — ABNORMAL LOW (ref 36.0–46.0)
Hemoglobin: 7.3 g/dL — ABNORMAL LOW (ref 12.0–15.0)
Immature Granulocytes: 3 %
Lymphocytes Relative: 5 %
Lymphs Abs: 0.6 10*3/uL — ABNORMAL LOW (ref 0.7–4.0)
MCH: 29.8 pg (ref 26.0–34.0)
MCHC: 31.3 g/dL (ref 30.0–36.0)
MCV: 95.1 fL (ref 80.0–100.0)
Monocytes Absolute: 1.6 10*3/uL — ABNORMAL HIGH (ref 0.1–1.0)
Monocytes Relative: 12 %
Neutro Abs: 9.9 10*3/uL — ABNORMAL HIGH (ref 1.7–7.7)
Neutrophils Relative %: 76 %
Platelets: 358 10*3/uL (ref 150–400)
RBC: 2.45 MIL/uL — ABNORMAL LOW (ref 3.87–5.11)
RDW: 16 % — ABNORMAL HIGH (ref 11.5–15.5)
WBC: 12.9 10*3/uL — ABNORMAL HIGH (ref 4.0–10.5)
nRBC: 0.6 % — ABNORMAL HIGH (ref 0.0–0.2)

## 2023-12-10 LAB — GLUCOSE, CAPILLARY
Glucose-Capillary: 153 mg/dL — ABNORMAL HIGH (ref 70–99)
Glucose-Capillary: 66 mg/dL — ABNORMAL LOW (ref 70–99)
Glucose-Capillary: 79 mg/dL (ref 70–99)
Glucose-Capillary: 91 mg/dL (ref 70–99)

## 2023-12-10 LAB — URINE CULTURE: Culture: <10000 — AB

## 2023-12-10 LAB — POTASSIUM: Potassium: 4.9 mmol/L (ref 3.5–5.1)

## 2023-12-10 MED ORDER — LOPERAMIDE HCL 1 MG/7.5ML PO SUSP: 4.0000 mg | Freq: Four times a day (QID) | ORAL | Status: DC

## 2023-12-10 MED ORDER — MIDODRINE HCL 5 MG PO TABS
2.5000 mg | ORAL_TABLET | Freq: Once | ORAL | Status: AC
  Administered 2023-12-10: 2.5 mg via ORAL
  Filled 2023-12-10: qty 1

## 2023-12-10 MED ORDER — NICOTINE 21 MG/24HR TD PT24: 21.0000 mg | MEDICATED_PATCH | Freq: Every day | TRANSDERMAL | Status: DC

## 2023-12-10 MED ORDER — BUDESONIDE 0.5 MG/2ML IN SUSP: 0.5000 mg | Freq: Two times a day (BID) | RESPIRATORY_TRACT | Status: DC

## 2023-12-10 MED ORDER — SODIUM CHLORIDE 0.9% FLUSH: 10.0000 mL | Freq: Two times a day (BID) | INTRAVENOUS | Status: DC

## 2023-12-10 MED ORDER — ACETAMINOPHEN 500 MG PO TABS: 1000.0000 mg | ORAL_TABLET | Freq: Three times a day (TID) | ORAL | Status: DC | PRN

## 2023-12-10 MED ORDER — MIDODRINE HCL 5 MG PO TABS
5.0000 mg | ORAL_TABLET | Freq: Three times a day (TID) | ORAL | Status: DC
  Administered 2023-12-10 – 2023-12-11 (×4): 5 mg
  Filled 2023-12-10 (×4): qty 1

## 2023-12-10 MED ORDER — ALUM & MAG HYDROXIDE-SIMETH 200-200-20 MG/5ML PO SUSP: 15.0000 mL | ORAL | Status: DC | PRN

## 2023-12-10 MED ORDER — MIDODRINE HCL 5 MG PO TABS: 5.0000 mg | ORAL_TABLET | Freq: Three times a day (TID) | ORAL | Status: DC

## 2023-12-10 MED ORDER — BUSPIRONE HCL 10 MG PO TABS: 10.0000 mg | ORAL_TABLET | Freq: Two times a day (BID) | ORAL | Status: DC

## 2023-12-10 MED ORDER — GUAIFENESIN 100 MG/5ML PO LIQD: 5.0000 mL | Freq: Four times a day (QID) | ORAL | Status: DC | PRN

## 2023-12-10 MED ORDER — CHLORHEXIDINE GLUCONATE CLOTH 2 % EX PADS: 6.0000 | MEDICATED_PAD | Freq: Every day | CUTANEOUS | Status: DC

## 2023-12-10 MED ORDER — BANATROL TF EN LIQD: 60.0000 mL | Freq: Three times a day (TID) | ENTERAL | Status: DC

## 2023-12-10 MED ORDER — MONTELUKAST SODIUM 10 MG PO TABS: 10.0000 mg | ORAL_TABLET | Freq: Every day | ORAL | Status: DC

## 2023-12-10 MED ORDER — GABAPENTIN 250 MG/5ML PO SOLN: 100.0000 mg | Freq: Two times a day (BID) | ORAL | Status: DC

## 2023-12-10 MED ORDER — JUVEN PO PACK: 1.0000 | PACK | Freq: Two times a day (BID) | ORAL | Status: DC

## 2023-12-10 MED ORDER — NOREPINEPHRINE 4 MG/250ML-% IV SOLN
2.0000 ug/min | INTRAVENOUS | Status: DC | PRN
Start: 2023-12-10 — End: 2023-12-11

## 2023-12-10 MED ORDER — ACETAMINOPHEN 500 MG PO TABS
1000.0000 mg | ORAL_TABLET | Freq: Three times a day (TID) | ORAL | Status: DC | PRN
  Administered 2023-12-10: 1000 mg via ORAL
  Filled 2023-12-10: qty 2

## 2023-12-10 MED ORDER — ARFORMOTEROL TARTRATE 15 MCG/2ML IN NEBU: 15.0000 ug | INHALATION_SOLUTION | Freq: Two times a day (BID) | RESPIRATORY_TRACT | Status: DC

## 2023-12-10 MED ORDER — MEDIHONEY WOUND/BURN DRESSING EX PSTE: 1.0000 | PASTE | Freq: Every day | CUTANEOUS | Status: DC

## 2023-12-10 MED ORDER — DEXTROSE 50 % IV SOLN
12.5000 g | INTRAVENOUS | Status: AC
  Administered 2023-12-10: 12.5 g via INTRAVENOUS

## 2023-12-10 MED ORDER — PROSOURCE TF20 ENFIT COMPATIBL EN LIQD: 60.0000 mL | Freq: Every day | ENTERAL | Status: DC

## 2023-12-10 MED ORDER — VITAL 1.5 CAL PO LIQD: 1000.0000 mL | ORAL | Status: DC

## 2023-12-10 MED ORDER — SODIUM CHLORIDE 0.9 % IV SOLN: 1.0000 g | Freq: Two times a day (BID) | INTRAVENOUS | Status: AC

## 2023-12-10 MED ORDER — ORAL CARE MOUTH RINSE: 15.0000 mL | OROMUCOSAL | Status: DC

## 2023-12-10 MED ORDER — MELATONIN 5 MG PO TABS: 5.0000 mg | ORAL_TABLET | Freq: Every evening | ORAL | Status: DC | PRN

## 2023-12-10 MED ORDER — DICLOFENAC SODIUM 1 % EX GEL
2.0000 g | Freq: Four times a day (QID) | CUTANEOUS | Status: DC
  Administered 2023-12-10 – 2023-12-11 (×4): 2 g via TOPICAL
  Filled 2023-12-10: qty 100

## 2023-12-10 MED ORDER — FERROUS SULFATE 300 (60 FE) MG/5ML PO SOLN: 300.0000 mg | Freq: Every day | ORAL | Status: DC

## 2023-12-10 MED ORDER — MIDODRINE HCL 5 MG PO TABS: 2.5000 mg | ORAL_TABLET | Freq: Three times a day (TID) | ORAL | Status: DC

## 2023-12-10 MED ORDER — DICLOFENAC SODIUM 1 % EX GEL: 4.0000 g | Freq: Four times a day (QID) | CUTANEOUS | Status: DC

## 2023-12-10 MED ORDER — REVEFENACIN 175 MCG/3ML IN SOLN: 175.0000 ug | Freq: Every day | RESPIRATORY_TRACT | Status: DC

## 2023-12-10 MED ORDER — SODIUM CHLORIDE 0.9% FLUSH
10.0000 mL | INTRAVENOUS | Status: DC | PRN
Start: 2023-12-10 — End: 2024-02-09

## 2023-12-10 MED ORDER — ALBUMIN HUMAN 25 % IV SOLN
25.0000 g | Freq: Four times a day (QID) | INTRAVENOUS | Status: AC
  Administered 2023-12-10 – 2023-12-11 (×3): 25 g via INTRAVENOUS
  Filled 2023-12-10 (×3): qty 100

## 2023-12-10 MED ORDER — SCOPOLAMINE 1 MG/3DAYS TD PT72: 1.0000 | MEDICATED_PATCH | TRANSDERMAL | Status: DC

## 2023-12-10 MED ORDER — DIPHENOXYLATE-ATROPINE 2.5-0.025 MG PO TABS: 2.0000 | ORAL_TABLET | Freq: Four times a day (QID) | ORAL | Status: DC

## 2023-12-10 MED ORDER — SIMETHICONE 40 MG/0.6ML PO SUSP: 80.0000 mg | Freq: Four times a day (QID) | ORAL | Status: DC | PRN

## 2023-12-10 MED ORDER — MIDODRINE HCL 5 MG PO TABS
2.5000 mg | ORAL_TABLET | Freq: Three times a day (TID) | ORAL | Status: DC
  Administered 2023-12-10: 2.5 mg
  Filled 2023-12-10: qty 1

## 2023-12-10 MED ORDER — LEVALBUTEROL HCL 0.63 MG/3ML IN NEBU: 0.6300 mg | INHALATION_SOLUTION | RESPIRATORY_TRACT | Status: DC | PRN

## 2023-12-10 MED ORDER — PANTOPRAZOLE SODIUM 40 MG IV SOLR: 40.0000 mg | INTRAVENOUS | Status: DC

## 2023-12-10 MED ORDER — APIXABAN 5 MG PO TABS
5.0000 mg | ORAL_TABLET | Freq: Two times a day (BID) | ORAL | Status: DC
Start: 2023-12-10 — End: 2024-02-09

## 2023-12-10 NOTE — Progress Notes (Signed)
 [facility] Tube Team Note:  Consult received to pull back existing Cortrak feeding tube.  New marking is 71cm.  No x-ray is required. RN may begin using tube.   If the tube becomes dislodged please keep the tube and contact the [facility] team at [email or web address] for replacement.  If after hours and replacement cannot be delayed, place a NG tube and confirm placement with an abdominal x-ray.    Shelle Iron RD, LDN Contact via [employer].

## 2023-12-10 NOTE — Discharge Summary (Cosign Needed)
 Physician Discharge Summary         Patient ID: Jenny White MRN: [id number] DOB/AGE: 01/28/1946 78 y.o.  Admit date: 11/11/2023 Discharge date: 12/11/2023  Discharge Diagnoses:    Active Hospital Problems   Diagnosis Date Noted   S/P exploratory laparotomy 11/11/2023   Chronic respiratory failure with hypoxia (HCC) 12/07/2023   Acute on chronic respiratory failure with hypoxia (HCC) 12/05/2023   Pressure injury of skin 12/05/2023   Protein-calorie malnutrition, severe 11/22/2023   Acute respiratory failure with hypoxia (HCC) 11/21/2023   Small bowel obstruction due to adhesions (HCC) 11/19/2023   Aspiration pneumonia (HCC) 11/19/2023   Septic shock (HCC) 11/19/2023   Paroxysmal atrial fibrillation (HCC) 11/19/2023   AKI (acute kidney injury) (HCC) 11/19/2023   Unspecified protein-calorie malnutrition (HCC) 11/19/2023   Shock (HCC) 11/11/2023    Resolved Hospital Problems  No resolved problems to display.      Discharge summary   Jenny White 78 year old woman with a pmhx significant for COPD, GERD, nephrolithiasis , HTN, OSA, Lung cancer ( Adenocarcinoma, diagnosed 05/2023) who presented to the emergency room with complaints of abdominal pain with N/V for the past week. She states the abdominal  pain got worsened after dinner. She reported experiencing nausea and  vomiting this morning.    In the ED  her labs were significant for a lactate 4.1 After fluid resuscitation, Lactate decreased to 2.5. CT scan showed high grade small bowel obstruction with transition point in the right lower quadrant along the distal ileum. Some trace of ascites and mesenteric stranding.Concern for bowel wall ischemia. Broad spectrum antibiotics ( zosyn) was started, blood cultures pending.    Patient was taken to the ER emergently with CCS 3/4 for Ex-lap / LOA. Intraoperative course  was notable for free fluid on entry, with closed loop bowel obstruction noted secondary to tight band adhesion. Patient left in  discontinuity/ anastomosis not completed with open abdomen and plan for second look in 24-48 hrs. Patient remained intubated and was transferred to ICU in critical condition. Her course was complicated by aspiration pneumonia to aeromonas and hafnia alvei; she completed a couse of meropenem.   Jenny White was initially extubated on 3/10. However, patient was oversedated and was transferred to ICU on 3/13 requiring reintubation. , extubated 3/16. On 3/17, patient received zyprexa overnight and was encephalophatic and tachypneic not responsive to BiPAP; she was re-intubated on 3/17 with patient's family support.  After three unsuccessful extubations, PCCM team discussed GOC with family as they initially did not want patient to be transfer to a facility but wanted to give patient to have a chance to wean off vent; PCCM discussed the need for long-term rehab needs after tracheostomy placement. Family ultimately approved and tracheostomy was placed at bedside on 3/21.   Patient's course was further complicated by severe anxiety, high output from ostomy site with several changes to tube feeding brands and antidiarrheal medications to achieve ostomy output goal of <1L. On 3/31, patient became hypotensive, bradycardic, and hypothermic. Initially encephalopathy s/p xanax, improved. Patient was started on empiric coverage for hospital acquired pneumonia and uti given high sediment in urine causing foley catheter kinks and urinary retention. At day of discharge, patient's blood, urine, and sputum cultures have not finalized. Patient is in fair state, now on low dose levophed and being transitioned to midodrine 5 mg TID.   Significant Hospital Events: Including procedures, antibiotic start and stop dates in addition to other pertinent events  3/4: Presented to ED with abdominal pain, N/V. Elevated LA found to have SBO taken emergently to surgery for EX-LAP.  3/7 extubated reintubated 3/10  extubated again 3/11 OFF NIPPV 3/12 got ativan at HS. Sleepy and difficult to arouse all day.  3/13 transferred to ICU for obtundation. Re-intubated.  3/16 Extubated  3/17 zyprexa x 1 overnight,  Obtunded , failed BiPAP, reintubated 3/17 several conversations with unhappy family members, offered transfer to different facility, called [facility]>> no beds 3/18 resumed BuSpar, gabapentin, stopped oxycodone & fent gtt since oversedated 3/18 Re-started cefepime for continued fever and leukocytosis 3/19 Amiodarone IV extravasated LUE 3/20 dex, low dose NE, diuresing  3/21 tracheostomy placed at bedside 3/23-24 failed TCT secondary to anxiety 3/24 completed antibitoic therapy for aeromonal and hafnia alvei pneumonia with meropenem for 14 days 3/24 Increased ostomy output >2L; General surgery guiding fiber and antidiarrheal medications  3/25 Transferred to for ongoing care - back on ventilatory support 3/31 Discontinued xanax as patient was hypotensive 3/31 shock with hypothermia, bradycardia - c/f septic shock; started on Cefepime and vancomycin. Foley cath exchanged 4/1 Discussion with daughter-in-law who is not happy with care.  4/1 Ongoing conversations with general surgery about tube feeds and ostomy output volume. Changed tube feeds 4/2 transitioned to 5 mg TID midodrine to lower levophed needs 3/31-4/2 no TCT due to severe anxiety 3/31 -4/2 decreased ostomy output <1.5L   Discharge Plan by Active Problems    Shock Improved s/p albumin resuscitation. Now off levophed.  Intermittently hypothermic. Bradycardia has resolved. Urine, respiratory, and blood cultures still pending. GBPs and GNRs on sputum now. Was initially started on Vancomycin but discontinued as MRSA PCR negative. Plan: - Continue on meropenem 3/31-4/7 for HAP and UTI - Follow urine, blood, and sputum cultures - Continue midodrine 5 mg TID - Patient responsive to Albumin therapy; will re-dose today - TTE wo WMA;  continue telemetry - No benzodiazepines or zyprexa for this patient as it causes severe sedation - Bladder scan qshift for concern of urinary retention  Acute on chronic resp failure with hypoxia, hypercarbia Severe COPD -FEV1 30% RLL Cancer s/p SBRT S/p tracheostomy 3/21 ETT 3/4 >>3/7 >> 3/10, 3/13- 3/16, 3/17 >>3/21 -3/25 Patient has been unable to tolerate PS/TCT trial due to severe anxiety with limited medications at this time. Currently being treated for HAP as above Plan: - Continue pressure-point ventilation as she can tolerate - Routine trach care - Guaifenesin PRN if increased secretions - Pulmonary hygiene - No benzodiazepines for this person; associated with encephalopathy  High grade small bowel obstruction  Severe protein calorie malnutrition End ileostomy S/p ex lap 3/4 with resection and end ileostomy Hyperkalemia; resolved s/p modification of tube feeds  Stool output 1.4L over 24hrs - Per surgery, patient ok to go to [facility] with current therapy - Imodium 4 mg 4x daily - Lomotil 2 tablets 4x daily - Banatrol fiber 60mL TID - Appreciate surgery recommendations - Monitor Qtc with EKG - On Vital feeding supplement from [employer]; 5ml/hr - Monitor CMP, magnesium, and phosphorus - Simethicone PRN - Scopolamine patch for nausea - AVOID Qtc prolonging medications given high doses of Imodium   Severe anxiety at baseline - Gabapentin and Buspar - No benzodiazepines for this patient  Hyponatremia, resolved Improved. 139 today - CTM - Follow up AM Na with redosing Albumin  Anemia No overt blood loss - Following CBC - Goal hemoglobin 7.0 - Normal ferritin, sat ratio 6 - Continue iron supplementation - If continues to downtrend, may  need to hold Eliquis  New onset paroxymal atrial fibrillation - NSR. - TTE 3/6 & 3/31 normal LVEF and no WMA - On DOAC; monitor Hgb - Optimize electrolytes  Diabetes type 2  - Following off SSI - CBGs with tube feeds    Social: Patient's daughter-in-law, Shaquoya Cosper, and patient's son, Zaley Talley and in charge of patient's care. Patient is Full code, full scope of care as last conversation on 12/08/2024.  Significant Hospital tests/ studies      Latest Ref Rng & Units 12/11/2023   12:19 AM 12/10/2023    5:16 AM 12/10/2023    2:02 AM  BMP  Glucose 70 - 99 mg/dL 161  096    BUN 8 - 23 mg/dL 65  72    Creatinine 0.45 - 1.00 mg/dL 4.09  8.11    Sodium 914 - 145 mmol/L 139  133    Potassium 3.5 - 5.1 mmol/L 5.1  4.8  4.9   Chloride 98 - 111 mmol/L 109  105    CO2 22 - 32 mmol/L 23  23    Calcium 8.9 - 10.3 mg/dL 78.2  9.7        Latest Ref Rng & Units 12/11/2023   12:19 AM 12/10/2023    5:16 AM 12/09/2023    5:39 AM  CBC  WBC 4.0 - 10.5 K/uL 10.9  12.9  16.6   Hemoglobin 12.0 - 15.0 g/dL 7.0  7.3  7.8   Hematocrit 36.0 - 46.0 % 21.9  23.3  25.0   Platelets 150 - 400 K/uL 331  358  359     Recent Results (from the past 240 hours)  Culture, Respiratory w Gram Stain     Status: None (Preliminary result)   Collection Time: 12/08/23  2:51 PM   Specimen: Tracheal Aspirate; Respiratory  Result Value Ref Range Status   Specimen Description TRACHEAL ASPIRATE  Final   Special Requests NONE  Final   Gram Stain   Final    NO WBC SEEN RARE YEAST WITH PSEUDOHYPHAE RARE GRAM POSITIVE COCCI RARE GRAM NEGATIVE RODS    Culture   Final    CULTURE REINCUBATED FOR BETTER GROWTH Performed at [facility], [address] [place], [place] [address]    Report Status PENDING  Incomplete  MRSA Next Gen by PCR, Nasal     Status: None   Collection Time: 12/08/23  3:53 PM   Specimen: Nasal Mucosa; Nasal Swab  Result Value Ref Range Status   MRSA by PCR Next Gen NOT DETECTED NOT DETECTED Final    Comment: (NOTE) The GeneXpert MRSA Assay (FDA approved for NASAL specimens only), is one component of a comprehensive MRSA colonization surveillance program. It is not intended to diagnose MRSA infection nor to guide or  monitor treatment for MRSA infections. Test performance is not FDA approved in patients less than 68 years old. Performed at [facility], [address] [place], [place] [address]   Culture, blood (Routine X 2) w Reflex to ID Panel     Status: None (Preliminary result)   Collection Time: 12/08/23  4:18 PM   Specimen: BLOOD LEFT ARM  Result Value Ref Range Status   Specimen Description BLOOD LEFT ARM  Final   Special Requests   Final    BOTTLES DRAWN AEROBIC ONLY Blood Culture results may not be optimal due to an inadequate volume of blood received in culture bottles   Culture   Final  NO GROWTH 3 DAYS Performed at [facility], [address] [place], [place] [address]    Report Status PENDING  Incomplete  Remove and replace urinary cath (placed > 5 days) then obtain urine culture from new indwelling urinary catheter.     Status: Abnormal   Collection Time: 12/09/23 11:37 AM   Specimen: Urine, Catheterized  Result Value Ref Range Status   Specimen Description URINE, CATHETERIZED  Final   Special Requests NONE  Final   Culture (A)  Final    <10,000 COLONIES/mL INSIGNIFICANT GROWTH Performed at [facility], [address], [place], [place] [address]    Report Status 12/10/2023 FINAL  Final  Fungus culture, blood     Status: None (Preliminary result)   Collection Time: 12/09/23 11:52 AM   Specimen: BLOOD  Result Value Ref Range Status   Specimen Description BLOOD SITE NOT SPECIFIED  Final   Special Requests   Final    BOTTLES DRAWN AEROBIC AND ANAEROBIC Blood Culture results may not be optimal due to an inadequate volume of blood received in culture bottles   Culture   Final    NO GROWTH 2 DAYS Performed at [facility], [address] [place], [place] [address]    Report Status PENDING  Incomplete    VAS [place] UPPER EXTREMITY VENOUS DUPLEX Result Date: 12/09/2023 UPPER VENOUS STUDY  Patient Name:  DERRA SHARTZER  Date of Exam:   12/09/2023 Medical Rec #:  [id number]   Accession #:    [id number] Date of Birth: May 23, 1946    Patient Gender: F Patient Age:   19 years Exam Location:  [facility] Procedure:      VAS [place] UPPER EXTREMITY VENOUS DUPLEX Referring Phys: Melody Comas --------------------------------------------------------------------------------  Indications: Rule out VTE, Edema, and Post-op Other Indications: Lung cancer. Comparison Study: No prior exam. Performing Technologist: Fernande Bras  Examination Guidelines: A complete evaluation includes B-mode imaging, spectral Doppler, color Doppler, and power Doppler as needed of all accessible portions of each vessel. Bilateral testing is considered an integral part of a complete examination. Limited examinations for reoccurring indications may be performed as noted.  Right Findings: +----------+------------+---------+-----------+----------+-------+ RIGHT     CompressiblePhasicitySpontaneousPropertiesSummary +----------+------------+---------+-----------+----------+-------+ Subclavian    Full       Yes       Yes                      +----------+------------+---------+-----------+----------+-------+  Left Findings: +----------+------------+---------+-----------+----------+-------+ LEFT      CompressiblePhasicitySpontaneousPropertiesSummary +----------+------------+---------+-----------+----------+-------+ IJV           Full       Yes       Yes                      +----------+------------+---------+-----------+----------+-------+ Subclavian    Full       Yes       Yes                      +----------+------------+---------+-----------+----------+-------+ Axillary      Full       Yes       Yes                      +----------+------------+---------+-----------+----------+-------+ Brachial      Full       Yes       Yes                      +----------+------------+---------+-----------+----------+-------+  Radial        Full                                           +----------+------------+---------+-----------+----------+-------+ Ulnar         Full                                          +----------+------------+---------+-----------+----------+-------+ Cephalic      Full       Yes       Yes                      +----------+------------+---------+-----------+----------+-------+ Basilic       Full       Yes       Yes                      +----------+------------+---------+-----------+----------+-------+  Summary:  Right: No evidence of thrombosis in the subclavian.  Left: No evidence of deep vein thrombosis in the upper extremity. No evidence of superficial vein thrombosis in the upper extremity.  *See table(s) above for measurements and observations.  Diagnosing physician: Lemar Livings MD Electronically signed by Lemar Livings MD on 12/09/2023 at 4:46:58 PM.    Final    DG Abd 1 View Result Date: 12/09/2023 CLINICAL DATA:  Abdominal pain and vomiting. EXAM: ABDOMEN - 1 VIEW COMPARISON:  11/28/2020 FINDINGS: No gaseous bowel dilatation to suggest obstruction. Feeding tube is identified with transpyloric tip placement in the distal descending duodenum. Bones appear diffusely demineralized. IMPRESSION: 1. No gaseous bowel dilatation. 2. Feeding tube tip is in the proximal to mid duodenum. Electronically Signed   By: Kennith Center M.D.   On: 12/09/2023 09:17   [place] EKG SITE RITE Result Date: 12/08/2023 If Site Rite image not attached, placement could not be confirmed due to current cardiac rhythm.  ECHOCARDIOGRAM LIMITED Result Date: 12/08/2023    ECHOCARDIOGRAM LIMITED REPORT   Patient Name:   JAMARA VARY Date of Exam: 12/08/2023 Medical Rec #:  [id number]  Height:       67.0 in Accession #:    [id number] Weight:       105.2 lb Date of Birth:  03/07/1946   BSA:          1.539 m Patient Age:    77 years   BP:           115/39 mmHg Patient Gender: F          HR:           52 bpm. Exam Location:  Inpatient Procedure: Limited Echo, Color Doppler  and Cardiac Doppler (Both Spectral and            Color Flow Doppler were utilized during procedure). Indications:    R94.31 Abnormal EKG  History:        Patient has prior history of Echocardiogram examinations, most                 recent 11/13/2023. COPD, Arrythmias:Atrial Fibrillation; Risk                 Factors:Hypertension.  Sonographer:    Irving Burton Senior RDCS Referring Phys: [id number] Martina Sinner  Sonographer Comments: Technically difficult study, supine on artificial  respirator IMPRESSIONS  1. Left ventricular ejection fraction, by estimation, is 65 to 70%. The left ventricle has normal function. The left ventricle has no regional wall motion abnormalities.  2. Right ventricular systolic function is normal. The right ventricular size is normal. There is moderately elevated pulmonary artery systolic pressure. The estimated right ventricular systolic pressure is 52.4 mmHg.  3. The mitral valve is abnormal. Mild to moderate mitral valve regurgitation. No evidence of mitral stenosis.  4. Aortic valve sclerosis/calcification is present, without any evidence of aortic stenosis.  5. The inferior vena cava is dilated in size with >50% respiratory variability, suggesting right atrial pressure of 8 mmHg. Comparison(s): No significant change from prior study. FINDINGS  Left Ventricle: Left ventricular ejection fraction, by estimation, is 65 to 70%. The left ventricle has normal function. The left ventricle has no regional wall motion abnormalities. The left ventricular internal cavity size was normal in size. Right Ventricle: The right ventricular size is normal. No increase in right ventricular wall thickness. Right ventricular systolic function is normal. There is moderately elevated pulmonary artery systolic pressure. The tricuspid regurgitant velocity is 3.33 m/s, and with an assumed right atrial pressure of 8 mmHg, the estimated right ventricular systolic pressure is 52.4 mmHg. Mitral Valve: The mitral valve is  abnormal. There is mild thickening of the mitral valve leaflet(s). Mild to moderate mitral valve regurgitation. No evidence of mitral valve stenosis. Tricuspid Valve: The tricuspid valve is grossly normal. Tricuspid valve regurgitation is mild. Aortic Valve: Aortic valve sclerosis/calcification is present, without any evidence of aortic stenosis. Pulmonic Valve: The pulmonic valve was grossly normal. Pulmonic valve regurgitation is mild. Venous: The inferior vena cava is dilated in size with greater than 50% respiratory variability, suggesting right atrial pressure of 8 mmHg. Additional Comments: Spectral Doppler performed. Color Doppler performed.  RIGHT VENTRICLE RV S prime:     10.90 cm/s TAPSE (M-mode): 2.3 cm AORTIC VALVE LVOT Vmax:   85.50 cm/s LVOT Vmean:  57.300 cm/s LVOT VTI:    0.160 m TRICUSPID VALVE TR Peak grad:   44.4 mmHg TR Vmax:        333.00 cm/s  SHUNTS Systemic VTI: 0.16 m Rachelle Hora Croitoru MD Electronically signed by Thurmon Fair MD Signature Date/Time: 12/08/2023/4:32:37 PM    Final    DG CHEST PORT 1 VIEW Result Date: 12/08/2023 CLINICAL DATA:  Respiratory distress. EXAM: PORTABLE CHEST 1 VIEW COMPARISON:  Chest radiograph dated 12/03/2023. FINDINGS: Tracheostomy in similar position. Feeding tube extends below the diaphragm with tip beyond the margin of the image. Small bilateral pleural effusions and bibasilar atelectasis or infiltrate. No pneumothorax. Stable cardiac silhouette. No acute osseous pathology. IMPRESSION: Small bilateral pleural effusions and bibasilar atelectasis or infiltrate. Electronically Signed   By: Elgie Collard M.D.   On: 12/08/2023 14:02   DG CHEST PORT 1 VIEW Result Date: 12/03/2023 CLINICAL DATA:  Shortness of breath EXAM: PORTABLE CHEST 1 VIEW COMPARISON:  11/28/2023 FINDINGS: Tracheostomy remains in place, unchanged. Heart and mediastinal contours within normal limits. Aortic atherosclerosis. Small left pleural effusion with left basilar airspace opacity  again noted, slightly improved. No confluent opacity on the right. No acute bony abnormality. IMPRESSION: Continued small left pleural effusion with left lower lobe atelectasis or infiltrate, slightly improved since prior study. Electronically Signed   By: Charlett Nose M.D.   On: 12/03/2023 11:17   DG Abd 1 View Result Date: 11/29/2023 CLINICAL DATA:  Abdominal pain. Status post exploratory laparotomy with bowel resection and ileostomy. EXAM: ABDOMEN - 1  VIEW COMPARISON:  One-view abdomen 11/27/2023. FINDINGS: A small bore feeding tube is stable in position over the distal duodenum. Bowel gas pattern is within normal limits. Contrast in the proximal ascending colon is stable. Postoperative changes of the lumbar spine are noted. IMPRESSION: 1. Stable position of small bore feeding tube over the distal duodenum. 2. No acute abnormality. Electronically Signed   By: Marin Roberts M.D.   On: 11/29/2023 12:48   DG Chest Port 1 View Result Date: 11/28/2023 CLINICAL DATA:  Tracheostomy EXAM: PORTABLE CHEST 1 VIEW COMPARISON:  11/26/2023, 11/24/2023, 11/21/2023 FINDINGS: Interval tracheostomy, tip of the tube is 4.7 cm superior to carina. Right upper extremity central venous catheter tip over the SVC. Enteric tube tip below the diaphragm but incompletely assessed. Irregular left suprahilar opacity with fiducial marker. Small left-sided effusion with persistent dense left lung base airspace disease. Trace right pleural effusion. IMPRESSION: 1. Interval tracheostomy. 2. Persistent small left effusion with dense left lung base airspace disease. Trace right effusion. Aeration does not appear significantly changed compared with 11/26/2023. Electronically Signed   By: Jasmine Pang M.D.   On: 11/28/2023 16:39   DG Abd Portable 1V Result Date: 11/27/2023 CLINICAL DATA:  Abdominal discomfort, recent laparotomy and small-bowel resection EXAM: PORTABLE ABDOMEN - 1 VIEW COMPARISON:  11/22/2023 FINDINGS: 2 supine frontal  views of the abdomen and pelvis are obtained. Enteric catheter passes below diaphragm, tip overlying the region of the distal duodenum. No bowel obstruction or ileus. No masses or abnormal calcifications. Persistent retrocardiac consolidation most consistent with atelectasis. No acute bony abnormalities. IMPRESSION: 1. Enteric catheter tip projecting over distal duodenum. 2. Unremarkable bowel gas pattern.  No obstruction or ileus. 3. Stable retrocardiac consolidation consistent with atelectasis. Electronically Signed   By: Sharlet Salina M.D.   On: 11/27/2023 20:10   DG CHEST PORT 1 VIEW Result Date: 11/26/2023 CLINICAL DATA:  Acute respiratory failure with hypoxia. EXAM: PORTABLE CHEST 1 VIEW COMPARISON:  Chest x-ray dated November 24, 2023. FINDINGS: The patient is rotated to the right. Unchanged endotracheal and feeding tubes. Unchanged right upper extremity PICC line. The heart size and mediastinal contours are within normal limits. Improving atelectasis and small pleural effusion of the right lung base. Relatively unchanged small left pleural effusion and left basilar atelectasis. Fiducial marker again noted in the left upper lobe. No pneumothorax. No acute osseous abnormality. IMPRESSION: 1. Improving right basilar atelectasis and small pleural effusion. 2. Unchanged left basilar atelectasis and small pleural effusion. Electronically Signed   By: Obie Dredge M.D.   On: 11/26/2023 15:06   DG CHEST PORT 1 VIEW Result Date: 11/24/2023 CLINICAL DATA:  Intubation EXAM: PORTABLE CHEST 1 VIEW COMPARISON:  Same-day x-ray FINDINGS: Interval placement of endotracheal tube with distal tip 4.4 cm above the carina. Enteric tube courses below the diaphragm with distal tip beyond the inferior margin of the film. Stable right PICC line terminating at the mid SVC level. Heart size within normal limits. Aortic atherosclerosis. Small bilateral pleural effusions with associated hazy bibasilar opacities, similar to  minimally progressed. No pneumothorax. IMPRESSION: 1. Interval placement of endotracheal tube with distal tip 4.4 cm above the carina. 2. Small bilateral pleural effusions with associated hazy bibasilar opacities, similar to minimally progressed. Electronically Signed   By: Duanne Guess D.O.   On: 11/24/2023 14:55   DG CHEST PORT 1 VIEW Result Date: 11/24/2023 CLINICAL DATA:  Dyspnea. EXAM: PORTABLE CHEST 1 VIEW COMPARISON:  Chest radiographs 11/21/2023 and 11/20/2023. CT 11/11/2023. FINDINGS: 0836 hours. Two views  submitted. Interval extubation. Feeding tube projects below the diaphragm, tip not visualized. There is a right arm PICC with its tip overlying the lower SVC. The heart size and mediastinal contours are stable with aortic atherosclerosis. No significant change in left greater than right pleural effusions and associated bibasilar pulmonary opacities. Left suprahilar fiducial marker noted. No pneumothorax. Numerous telemetry leads overlie the chest. IMPRESSION: Interval extubation. No significant change in left greater than right pleural effusions and associated bibasilar pulmonary opacities. Electronically Signed   By: Carey Bullocks M.D.   On: 11/24/2023 12:36   DG Abd Portable 1V Result Date: 11/22/2023 CLINICAL DATA:  NG tube placement. EXAM: PORTABLE ABDOMEN - 1 VIEW COMPARISON:  11/21/2023. FINDINGS: Enteric tube passes well below the diaphragm, tip projects in the central abdomen, likely within the third portion of the duodenum. This is similar to the previous day's exam allowing for differences in patient positioning. Paucity of bowel gas. No evidence of bowel obstruction. Stable right mid abdomen colostomy. IMPRESSION: Well-positioned enteric tube. Electronically Signed   By: Amie Portland M.D.   On: 11/22/2023 10:22   DG Abd Portable 1V Result Date: 11/21/2023 CLINICAL DATA:  NG tube placement. EXAM: PORTABLE ABDOMEN - 1 VIEW COMPARISON:  None Available. FINDINGS: Weighted enteric  tube in place, the tip is in the right upper quadrant, directed centrally. Tip is likely within the distal stomach. Midline skin staples. IMPRESSION: Weighted enteric tube with tip in the distal stomach. Electronically Signed   By: Narda Rutherford M.D.   On: 11/21/2023 11:17   DG Chest Port 1 View Result Date: 11/21/2023 CLINICAL DATA:  Hypoxia. EXAM: PORTABLE CHEST 1 VIEW COMPARISON:  November 20, 2023. FINDINGS: The heart size and mediastinal contours are within normal limits. Endotracheal tube is in grossly good position. Right-sided PICC line is unchanged. Small left pleural effusion is noted with probable minimal associated subsegmental atelectasis. Minimal right basilar subsegmental atelectasis is also noted with small pleural effusion. The visualized skeletal structures are unremarkable. IMPRESSION: Stable support apparatus. Stable bibasilar subsegmental atelectasis with small pleural effusions, left greater than right. Electronically Signed   By: Lupita Raider M.D.   On: 11/21/2023 10:17   EEG adult Result Date: 11/20/2023 Charlsie Quest, MD     11/20/2023  7:08 PM Patient Name: DREYA BUHRMAN MRN: [id number] Epilepsy Attending: Charlsie Quest Referring Physician/Provider: Duayne Cal, NP Date: 11/20/2023 Duration: 25.53 mins Patient history: 78yo F noted have facial twitching after intubation. EEG to evaluate for seizure Level of alertness: comatose AEDs during EEG study: GBP, versed Technical aspects: This EEG study was done with scalp electrodes positioned according to the 10-20 International system of electrode placement. Electrical activity was reviewed with band pass filter of 1-70Hz , sensitivity of 7 uV/mm, display speed of 40mm/sec with a 60Hz  notched filter applied as appropriate. EEG data were recorded continuously and digitally stored.  Video monitoring was available and reviewed as appropriate. Description: EEG showed continuous generalized 3 to 6 Hz theta-delta slowing admixed with  12-14hz  beta activity. Hyperventilation and photic stimulation were not performed.   ABNORMALITY - Continuous slow, generalized IMPRESSION: This study is suggestive of severe diffuse encephalopathy. No seizures or epileptiform discharges were seen throughout the recording. Charlsie Quest   DG Chest Port 1 View Result Date: 11/20/2023 CLINICAL DATA:  Intubated EXAM: PORTABLE CHEST 1 VIEW COMPARISON:  11/19/2023, 11/15/2023 FINDINGS: Interval intubation, tip of the endotracheal tube is about 3.9 cm superior to the carina. Right upper extremity central venous catheter  tip over the SVC. Normal cardiac size. Hyperinflation with emphysema. Patchy right infrahilar infiltrate. Small bilateral pleural effusions left greater than right. Dense left retrocardiac opacity. Aortic atherosclerosis. Clips in the left suprahilar lung adjacent to vague pulmonary opacity. IMPRESSION: 1. Interval intubation with tip of endotracheal tube about 3.9 cm superior to the carina. Right upper extremity central venous catheter tip over the SVC. 2. Hyperinflation with emphysema. Patchy right infrahilar infiltrate. Dense left retrocardiac opacity and small pleural effusions without significant change Electronically Signed   By: Jasmine Pang M.D.   On: 11/20/2023 17:00   CT HEAD WO CONTRAST ( ) Result Date: 11/20/2023 CLINICAL DATA:  Mental status change with unknown cause EXAM: CT HEAD WITHOUT CONTRAST TECHNIQUE: Contiguous axial images were obtained from the base of the skull through the vertex without intravenous contrast. RADIATION DOSE REDUCTION: This exam was performed according to the departmental dose-optimization program which includes automated exposure control, adjustment of the mA and/or kV according to patient size and/or use of iterative reconstruction technique. COMPARISON:  Brain MRI 06/22/2023 FINDINGS: Brain: No evidence of acute infarction, hemorrhage, hydrocephalus, extra-axial collection or mass lesion/mass effect.  Mild periventricular white matter low-density. Mild for age cerebral volume loss. Vascular: No hyperdense vessel or unexpected calcification. Skull: Normal. Negative for fracture or focal lesion. Sinuses/Orbits: Nasopharyngeal fluid level in the setting of intubation. IMPRESSION: No acute finding.  Stable from 2024 brain MRI. Electronically Signed   By: Tiburcio Pea M.D.   On: 11/20/2023 11:29   DG Chest Port 1 View Result Date: 11/19/2023 CLINICAL DATA:  Pneumonia EXAM: PORTABLE CHEST 1 VIEW COMPARISON:  11/15/2023, 11/14/2023, 11/12/2023, 06/09/2023 FINDINGS: Interval extubation and removal of esophageal tube. Right upper extremity central venous catheter tip over the SVC. Probable trace right effusion. At least small left effusion with persistent dense left lung base opacity. Stable cardiomediastinal silhouette with aortic atherosclerosis. Clip or marker in the left suprahilar lung IMPRESSION: 1. Interval extubation and removal of esophageal tube. 2. Persistent dense left lung base opacity with at least small left effusion. Trace right effusion. Findings appear slightly improved compared with 11/15/2023. Electronically Signed   By: Jasmine Pang M.D.   On: 11/19/2023 16:19   DG Chest Port 1 View Result Date: 11/15/2023 CLINICAL DATA:  ET tube placement EXAM: PORTABLE CHEST 1 VIEW COMPARISON:  X-ray 11/14/2023 FINDINGS: Stable ET tube, enteric tube and right-sided PICC. Hyperinflation. Persistent left retrocardiac opacity. Small pleural effusions. Stable cardiopericardial silhouette calcified aorta. Marker again seen in the medial left upper lobe. No pneumothorax. Overlapping cardiac leads. Apical pleural thickening. IMPRESSION: No significant interval change when adjusting for technique. Electronically Signed   By: Karen Kays M.D.   On: 11/15/2023 10:13   DG Chest Port 1 View Result Date: 11/14/2023 CLINICAL DATA:  Intubation EXAM: PORTABLE CHEST 1 VIEW COMPARISON:  X-ray 11/12/2023 and older  FINDINGS: The extreme lung apices are clipped off the edge of the film. ET tube in place. Enteric tube with tip extending beneath the diaphragm. Right-sided PICC with tip along the central SVC above the right atrium. Hyperinflation. No pneumothorax or edema. Chronic lung changes suggested. Tiny bilateral pleural effusions are seen with some focal left retrocardiac opacity. These are new from previous. Overlapping cardiac leads. Area of nodularity left upper lung with a marker is again noted. Nodular area at the right lung base as well. IMPRESSION: New right-sided PICC. Developing small bilateral pleural effusions and left retrocardiac opacity. Acute process is possible. Recommend follow-up. Electronically Signed   By: Karen Kays  M.D.   On: 11/14/2023 17:59   ECHOCARDIOGRAM COMPLETE Result Date: 11/13/2023    ECHOCARDIOGRAM REPORT   Patient Name:   SHIVANGI LUTZ Date of Exam: 11/13/2023 Medical Rec #:  [id number]  Height:       67.0 in Accession #:    [id number] Weight:       130.1 lb Date of Birth:  02/16/1946   BSA:          1.684 m Patient Age:    77 years   BP:           144/65 mmHg Patient Gender: F          HR:           90 bpm. Exam Location:  Inpatient Procedure: 2D Echo, Cardiac Doppler, Color Doppler and Intracardiac            Opacification Agent (Both Spectral and Color Flow Doppler were            utilized during procedure). Indications:   CHF-acute diastolic  History:       Patient has no prior history of Echocardiogram examinations.                Shock.  Sonographer:   Vern Claude Referring      [id number] GUYQIH CHAND Phys:  Sonographer Comments: Image acquisition challenging due to patient body habitus and Image acquisition challenging due to respiratory motion. IMPRESSIONS  1. Poor acoustic windows limit study.  2. Left ventricular ejection fraction, by estimation, is 60 to 65%. The left ventricle has normal function. Left ventricular diastolic parameters are consistent with Grade I diastolic  dysfunction (impaired relaxation).  3. Imaging of RV difficult . Right ventricular systolic function is mildly reduced. The right ventricular size is normal.  4. Trivial mitral valve regurgitation.  5. AV is thickened, calcified with mildly restricted motion.. Aortic valve regurgitation is not visualized. FINDINGS  Left Ventricle: Left ventricular ejection fraction, by estimation, is 60 to 65%. The left ventricle has normal function. The left ventricular internal cavity size was normal in size. There is no left ventricular hypertrophy. Left ventricular diastolic parameters are consistent with Grade I diastolic dysfunction (impaired relaxation). Right Ventricle: Imaging of RV difficult. The right ventricular size is normal. Right vetricular wall thickness was not assessed. Right ventricular systolic function is mildly reduced. Left Atrium: Left atrial size was normal in size. Right Atrium: Right atrial size was normal in size. Pericardium: There is no evidence of pericardial effusion. Mitral Valve: There is mild thickening of the mitral valve leaflet(s). There is mild calcification of the mitral valve leaflet(s). Trivial mitral valve regurgitation. MV peak gradient, 1.5 mmHg. The mean mitral valve gradient is 1.0 mmHg. Tricuspid Valve: The tricuspid valve is normal in structure. Tricuspid valve regurgitation is not demonstrated. Aortic Valve: AV is thickened, calcified with mildly restricted motion. Aortic valve regurgitation is not visualized. Aortic valve mean gradient measures 1.0 mmHg. Aortic valve peak gradient measures 2.4 mmHg. Aortic valve area, by VTI measures 2.47 cm. Pulmonic Valve: The pulmonic valve was not well visualized. Pulmonic valve regurgitation is not visualized. Aorta: The aortic root is normal in size and structure. IAS/Shunts: No atrial level shunt detected by color flow Doppler.  LEFT VENTRICLE PLAX 2D LVIDd:         3.70 cm     Diastology LVIDs:         2.50 cm     LV e' medial:    6.31  cm/s LV  PW:         0.70 cm     LV E/e' medial:  5.6 LV IVS:        0.70 cm     LV e' lateral:   9.03 cm/s LVOT diam:     2.00 cm     LV E/e' lateral: 3.9 LV SV:         29 LV SV Index:   17 LVOT Area:     3.14 cm  LV Volumes (MOD) LV vol d, MOD A4C: 60.4 ml LV vol s, MOD A4C: 18.6 ml LV SV MOD A4C:     60.4 ml RIGHT VENTRICLE RV Basal diam:  2.80 cm RV Mid diam:    2.30 cm RV S prime:     12.70 cm/s TAPSE (M-mode): 3.1 cm LEFT ATRIUM           Index       RIGHT ATRIUM          Index LA diam:      2.50 cm 1.48 cm/m  RA Area:     6.75 cm LA Vol (A2C): 16.5 ml 9.80 ml/m  RA Volume:   11.79 ml 7.00 ml/m LA Vol (A4C): 16.8 ml 9.97 ml/m  AORTIC VALVE                    PULMONIC VALVE AV Area (Vmax):    2.71 cm     PV Vmax:       0.66 m/s AV Area (Vmean):   2.27 cm     PV Peak grad:  1.7 mmHg AV Area (VTI):     2.47 cm AV Vmax:           78.00 cm/s AV Vmean:          53.950 cm/s AV VTI:            0.116 m AV Peak Grad:      2.4 mmHg AV Mean Grad:      1.0 mmHg LVOT Vmax:         67.20 cm/s LVOT Vmean:        39.050 cm/s LVOT VTI:          0.091 m LVOT/AV VTI ratio: 0.78  AORTA Ao Root diam: 3.20 cm Ao Asc diam:  3.10 cm MITRAL VALVE MV Area (PHT): 2.19 cm    SHUNTS MV Area VTI:   1.91 cm    Systemic VTI:  0.09 m MV Peak grad:  1.5 mmHg    Systemic Diam: 2.00 cm MV Mean grad:  1.0 mmHg MV Vmax:       0.61 m/s MV Vmean:      42.7 cm/s MV Decel Time: 346 msec MV E velocity: 35.30 cm/s MV A velocity: 58.60 cm/s MV E/A ratio:  0.60 Dietrich Pates MD Electronically signed by Dietrich Pates MD Signature Date/Time: 11/13/2023/2:11:14 PM    Final    DG Chest Port 1 View Result Date: 11/12/2023 CLINICAL DATA:  Endotracheal tube. EXAM: PORTABLE CHEST 1 VIEW COMPARISON:  November 11, 2023. FINDINGS: The heart size and mediastinal contours are within normal limits. Endotracheal and nasogastric tubes are unchanged in position. Left lung is clear. Minimal right basilar subsegmental atelectasis or scarring is noted. The visualized  skeletal structures are unremarkable. IMPRESSION: Stable support apparatus. Minimal right basilar subsegmental atelectasis or scarring. Electronically Signed   By: Lupita Raider M.D.   On: 11/12/2023 09:48   [place] EKG SITE RITE Result  Date: 11/12/2023 If Site Rite image not attached, placement could not be confirmed due to current cardiac rhythm.  DG CHEST PORT 1 VIEW Result Date: 11/11/2023 CLINICAL DATA:  Intubated EXAM: PORTABLE CHEST 1 VIEW COMPARISON:  06/09/2023, CT 11/11/2023, PET CT 09/08/2023 FINDINGS: Endotracheal tube tip is about 4.4 cm superior to the carina. Enteric tube tip below the diaphragm but incompletely visualized. Emphysema. Vague density with fiducial marker in the left upper lobe. Known peripheral right lung base mass is evident as vague right basilar opacity. Normal cardiac size with aortic atherosclerosis. IMPRESSION: 1. Endotracheal tube tip about 4.4 cm superior to carina 2. Emphysema. Vague right lung base opacity corresponding to known mass/consolidation in the region. Similar vague left upper lobe opacity with fiducial marker Electronically Signed   By: Jasmine Pang M.D.   On: 11/11/2023 19:36   DG Abdomen 1 View Result Date: 11/11/2023 CLINICAL DATA:  Status post nasogastric tube placement. Emesis and nausea. Non-small-cell lung cancer. 11/11/2023 EXAM: ABDOMEN - 1 VIEW COMPARISON:  CT chest, abdomen, and pelvis 11/11/2023 FINDINGS: New enteric tube descends below the diaphragm and curls along the greater curvature of the stomach with the tip overlying the right upper quadrant, likely within the distal stomach. Right upper quadrant cholecystectomy clips. Contrast excretion by the bilateral kidneys from IV contrast administered for CT 1.5 hours earlier. Fluid-filled bowel loops as on prior CT. The dilated loops of bowel and CT are likely grossly similar to prior, with left lower quadrant bowel loop measuring up to 3.9 cm in caliber. Kyphoplasty cement is again seen within the L2  vertebral body. L3-4 bilateral transpedicular rod and screw fusion hardware with associated intervertebral disc spacer. IMPRESSION: 1. New enteric tube tip overlies the right upper quadrant, likely within the distal stomach. 2. Dilated fluid-filled loops of bowel as on prior CT. Electronically Signed   By: Neita Garnet M.D.   On: 11/11/2023 15:58   CT Angio Chest/Abd/Pel for Dissection W and/or W/WO Result Date: 11/11/2023 CLINICAL DATA:  Non-small-cell lung cancer. Abdominal pain nausea vomiting. * Tracking Code: BO * EXAM: CT ANGIOGRAPHY CHEST, ABDOMEN AND PELVIS TECHNIQUE: Non-contrast CT of the chest was initially obtained. Multidetector CT imaging through the chest, abdomen and pelvis was performed using the standard protocol during bolus administration of intravenous contrast. Multiplanar reconstructed images and MIPs were obtained and reviewed to evaluate the vascular anatomy. RADIATION DOSE REDUCTION: This exam was performed according to the departmental dose-optimization program which includes automated exposure control, adjustment of the mA and/or kV according to patient size and/or use of iterative reconstruction technique. CONTRAST:  OMNIPAQUE IOHEXOL 350 MG/ML SOLN COMPARISON:  Chest CT with contrast 10/15/2023. PET-CT 09/08/2023. Older exams as well. FINDINGS: CTA CHEST FINDINGS Cardiovascular: Aortic root has a diameter of 3.2 cm. The ascending aorta at the level of the main pulmonary artery has a diameter of 3.7 x 3.4 cm. The descending thoracic aorta same level measures 2.6 x 2.5 cm. The distal aortic arch has a diameter approaching 3.0 cm. There is mild partially calcified atherosclerotic plaque. Some of the plaque along the descending thoracic aorta is irregular. Slight plaque also along the origin of the great vessels. No abnormal high density along the course of the thoracic aorta on the noncontrast dataset. No dissection or aneurysm formation. Coronary artery calcifications are seen.  Tiny pericardial effusion. Heart is nonenlarged. Mediastinum/Nodes: Patulous fluid-filled esophagus. Slight wall thickening of the distal esophagus. There is an enlarged heterogeneous thyroid gland. This was hypermetabolic on PET-CT scan. Please correlate  with known history. No specific abnormal lymph node enlargement identified in the axillary regions, hilum or mediastinum. Lungs/Pleura: Emphysematous lung changes identified. Bilateral apical pleural thickening. There is some left upper lobe anterior scarring and bronchiectasis, unchanged from previous. Areas of bronchial wall thickening identified along lower lungs. No pneumothorax, effusion or edema. Areas of mucous plugging along the left lower lobe inferolateral is again noted as well. The focal ill-defined opacity along the dependent right lower lobe is again seen. Previously this was measured at 3.5 x 3.5 cm and today when measured in similar fashion measures 2.5 x 4.3 cm. Overall similar when adjusted for variances in technique. No new dominant lung lesion. Some tiny nodules identified as well such as left lower lobe anteriorly on series 7, image 117 which is unchanged measuring 4 mm. The central nodule left suprahilar with tissue marker is stable. Previous dimension of 7 mm and today on series 7, image 34 7 mm. Musculoskeletal: Osteopenia. Scattered degenerative changes. There are multiple compression deformities along the spine which are new including T6 and T12. The T12 level has some sclerosis. Please correlate with the time course of injury. Review of the MIP images confirms the above findings. CTA ABDOMEN AND PELVIS FINDINGS VASCULAR Aorta: Partially calcified atherosclerotic plaque. No dissection or aneurysm formation. Maximal dimension of the inferior abdominal aorta of 2.2 cm. Overall there is moderate calcified plaque. Celiac: Mild disease along the origin. There is also calcified plaque along a atretic splenic artery. 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  SMA: Moderate atherosclerotic disease along the origin of the SMA with mild stenosis. More calcified plaque more distally along the SMA. Renals: Single bowel main renal arteries with some mild calcified plaque. IMA: Grossly patent. Inflow: There is calcified moderate disease diffusely along the iliac vessels with only mild areas of stenosis suggested along the common and external iliacs. More moderate along the internal. Of note the common femoral and visualized femoral vessels have more significant disease. Veins: No obvious venous abnormality within the limitations of this arterial phase study. Review of the MIP images confirms the above findings. NON-VASCULAR Hepatobiliary: With the limits of the early phase of the arterial bolus, grossly the liver is preserved. Previous cholecystectomy with a dilated common duct but unchanged from prior. Pancreas: Unremarkable. No pancreatic ductal dilatation or surrounding inflammatory changes. 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 Please correlate with history. Adrenals/Urinary Tract: Stable thickening of the adrenal glands. Large upper pole left-sided Bosniak 1 renal cyst measuring 7 cm. A few smaller foci identified as well in each kidney, too small to characterize and poorly seen on this arterial phase only examination. No collecting system dilatation. The ureters have normal course and caliber extending down to the urinary bladder. The bladder is underdistended. Stomach/Bowel: Dilated fluid-filled stomach and small bowel diffusely with caliber change seen of the distal small bowel such as axial image 217 of series 6,. Please see coronal series 9, image 79 in the right lower quadrant. No pneumatosis, free air. Trace free fluid. The colon is relatively decompressed with scattered stool. There are some loops of small bowel extending anterior to the left side of the transverse colon. Evaluation of the bowel wall enhancement is somewhat more difficult as this is a true  early arterial phase examination. Please correlate for any clinical evidence of ischemia. Again no pneumatosis. Lymphatic: No specific abnormal lymph node enlargement identified in the abdomen and pelvis. Reproductive: Uterus and bilateral adnexa are unremarkable. Other: No free intra-air. Scattered mild free fluid. Slight mesenteric  stranding. Mild mesenteric edema. Musculoskeletal: Degenerative changes seen of the spine and pelvis. Augmentation cement seen at L2. Fixation hardware posteriorly at L3-4. Associated streak artifact. Multilevel degenerative changes and stenosis. Critical Value/emergent results were called by telephone at the time of interpretation on 11/11/2023 at 12:40 pm to provider Alvira Monday, who verbally acknowledged these results. Review of the MIP images confirms the above findings. IMPRESSION: Scattered calcified as sclerotic plaque. Some areas of plaque are irregular. No dissection or aneurysm formation. There are areas of significant stenosis suggested particularly along the iliac vessels and femoral vessels at the edge of the imaging field. High-grade small-bowel obstruction is with transition point in the right lower quadrant along the distal ileum. There is some trace ascites and some mesenteric stranding. Evaluation the bowel wall in some areas is difficult due to the early phase of the arterial bolus. No pneumatosis. A component of bowel wall ischemia is difficult to completely exclude with this examination. Please correlate with clinical presentation. If needed short follow up could be considered. Distended esophagus with luminal fluid. This could relate to the bowel obstruction. Masslike parenchymal opacity again seen in the right lower lobe. Stable other small areas of lung nodularity as well as the focal nodule left upper lobe with a fiduciary marker distant with known history of neoplasm. No developing new lymph node enlargement seen in the chest, abdomen and pelvis. New  compression deformities along the thoracic spine at T12 and T6 compared to the study of 10/15/2023. Please correlate with any specific history and known injury. Additional workup as clinically appropriate to assess for etiology and exclude a marrow replacement process. Thyroid goiter. Emphysematous lung changes. Electronically Signed   By: Karen Kays M.D.   On: 11/11/2023 12:49   CT Chest W Contrast Result Date: 10/20/2023 CLINICAL DATA:  Non-small cell lung cancer, nonmetastatic, assess treatment response. * Tracking Code: BO * EXAM: CT CHEST WITH CONTRAST TECHNIQUE: Multidetector CT imaging of the chest was performed during intravenous contrast administration. RADIATION DOSE REDUCTION: This exam was performed according to the departmental dose-optimization program which includes automated exposure control, adjustment of the mA and/or kV according to patient size and/or use of iterative reconstruction technique. CONTRAST:  75mL OMNIPAQUE IOHEXOL 300 MG/ML  SOLN COMPARISON:  PET 09/08/2023 and CT chest 03/31/2023. FINDINGS: Cardiovascular: Atherosclerotic calcification of the aorta, aortic valve and coronary arteries. Heart is at the upper limits of normal in size. Small pericardial effusion. Enlarged pulmonic trunk. Mediastinum/Nodes: Enlarged thyroid, left greater than right, with small low-attenuation nodules measuring up to 9 mm on the right. No pathologically enlarged mediastinal lymph nodes. Bihilar lymph nodes are not enlarged by CT size criteria. No axillary adenopathy. Esophagus is grossly unremarkable. Lungs/Pleura: Biapical pleuroparenchymal scarring. Centrilobular and paraseptal emphysema. Additional scattered pulmonary parenchymal scarring. Masslike consolidation in the posterior right lower lobe measures approximately 3.5 x 3.5 cm (5/118), decreased in size from 4.3 x 5.7 cm on 03/31/2023. Peribronchovascular consolidation in the inferior right middle lobe (5/113), new. New patchy ground-glass in  the anterior segment left upper lobe (5/46). Fiducial marker in the perihilar left upper lobe, adjacent to a 7 mm nodule, previously a slightly thick-walled atypical pulmonary cyst on 03/31/2023. 4 mm anterior left lower lobe nodule (5/122), stable. No pleural fluid. Minimal debris in the airway. Upper Abdomen: Cholecystectomy. Low-attenuation mass in the left kidney. No specific follow-up necessary. Left renal vascular calcifications. Spleen is absent. Visualized portions of the liver, adrenal glands, kidneys, pancreas, stomach and bowel are otherwise grossly unremarkable. No  upper abdominal adenopathy. Musculoskeletal: Osteopenia. Degenerative changes in the spine. L2 vertebral body augmentation. Partially imaged posterior lumbar interbody fusion involving L3. No worrisome lytic or sclerotic lesions. IMPRESSION: 1. Interval decrease in size of masslike consolidation in the posterior right lower lobe, indicative of treatment response. New peribronchovascular consolidation in the inferior right middle lobe, possibly treatment related. 2. 7 mm perihilar left upper lobe nodule, previously seen as a minimally thick-walled atypical pulmonary cyst, overall decreased in size. Adjacent fiducial marker. Patchy ground-glass in the anterior segment left upper lobe may be treatment related. Recommend attention on follow-up. 3. Small pericardial effusion. 4. Enlarged nodular thyroid. Previous ultrasound 06/23/2023. No follow-up recommended unless clinically warranted. (Ref: J Am Coll Radiol. 2015 Feb;12(2): 143-50). 5. Aortic atherosclerosis (ICD10-I70.0). Coronary artery calcification. 6. Enlarged pulmonic trunk, indicative of pulmonary arterial hypertension. 7.  Emphysema (ICD10-J43.9). Electronically Signed   By: Leanna Battles M.D.   On: 10/20/2023 13:28    Discharge Exam: BP (!) 103/41   Pulse 74   Temp (!) 97.5 F (36.4 C) (Oral)   Resp (!) 24   Ht 5\' 7"  (1.702 m)   Wt 47.8 kg   SpO2 100%   BMI 16.50 kg/m    General: Chronically ill appearing, mildly anxious, in NAD Lungs: Trach in place without erythema or tenderness, with full ventilatory support. Good air movements bilaterally anteriorly Abdomen: No abdominal or suprapubic tenderness. Midline incision with right abdominal ostomy, stool in pouch.  Extremities: Warm and dry. Trace edema bilaterally Neuro: Awake, interactive, following directions. Mouthing words.  GU: Foley catheter  Labs at discharge   Lab Results  Component Value Date   CREATININE 0.65 12/11/2023   BUN 65 (H) 12/11/2023   NA 139 12/11/2023   K 5.1 12/11/2023   CL 109 12/11/2023   CO2 23 12/11/2023   Lab Results  Component Value Date   WBC 10.9 (H) 12/11/2023   HGB 7.0 (L) 12/11/2023   HCT 21.9 (L) 12/11/2023   MCV 95.2 12/11/2023   PLT 331 12/11/2023   Lab Results  Component Value Date   ALT 86 (H) 11/25/2023   AST 28 11/25/2023   ALKPHOS 103 11/25/2023   BILITOT 0.6 11/25/2023   Lab Results  Component Value Date   INR 1.0 11/12/2023   INR 0.9 06/16/2020    Current radiological studies    VAS [place] UPPER EXTREMITY VENOUS DUPLEX Result Date: 12/09/2023 UPPER VENOUS STUDY  Patient Name:  IDABELL PICKING  Date of Exam:   12/09/2023 Medical Rec #: [id number]   Accession #:    [id number] Date of Birth: Dec 15, 1945    Patient Gender: F Patient Age:   89 years Exam Location:  [facility] Procedure:      VAS [place] UPPER EXTREMITY VENOUS DUPLEX Referring Phys: Melody Comas --------------------------------------------------------------------------------  Indications: Rule out VTE, Edema, and Post-op Other Indications: Lung cancer. Comparison Study: No prior exam. Performing Technologist: Fernande Bras  Examination Guidelines: A complete evaluation includes B-mode imaging, spectral Doppler, color Doppler, and power Doppler as needed of all accessible portions of each vessel. Bilateral testing is considered an integral part of a complete examination. Limited  examinations for reoccurring indications may be performed as noted.  Right Findings: +----------+------------+---------+-----------+----------+-------+ RIGHT     CompressiblePhasicitySpontaneousPropertiesSummary +----------+------------+---------+-----------+----------+-------+ Subclavian    Full       Yes       Yes                      +----------+------------+---------+-----------+----------+-------+  Left Findings: +----------+------------+---------+-----------+----------+-------+ LEFT      CompressiblePhasicitySpontaneousPropertiesSummary +----------+------------+---------+-----------+----------+-------+ IJV           Full       Yes       Yes                      +----------+------------+---------+-----------+----------+-------+ Subclavian    Full       Yes       Yes                      +----------+------------+---------+-----------+----------+-------+ Axillary      Full       Yes       Yes                      +----------+------------+---------+-----------+----------+-------+ Brachial      Full       Yes       Yes                      +----------+------------+---------+-----------+----------+-------+ Radial        Full                                          +----------+------------+---------+-----------+----------+-------+ Ulnar         Full                                          +----------+------------+---------+-----------+----------+-------+ Cephalic      Full       Yes       Yes                      +----------+------------+---------+-----------+----------+-------+ Basilic       Full       Yes       Yes                      +----------+------------+---------+-----------+----------+-------+  Summary:  Right: No evidence of thrombosis in the subclavian.  Left: No evidence of deep vein thrombosis in the upper extremity. No evidence of superficial vein thrombosis in the upper extremity.  *See table(s) above for measurements and  observations.  Diagnosing physician: Lemar Livings MD Electronically signed by Lemar Livings MD on 12/09/2023 at 4:46:58 PM.    Final     Disposition:    Discharge disposition: [facility]        Discharge Instructions     Call MD for:  redness, tenderness, or signs of infection (pain, swelling, redness, odor or green/yellow discharge around incision site)   Complete by: As directed    Call MD for:  severe uncontrolled pain   Complete by: As directed    Call MD for:  temperature >100.4   Complete by: As directed    Diet - low sodium heart healthy   Complete by: As directed    Discharge wound care:   Complete by: As directed    Pressure Injury 11/25/23 Coccyx Left;Medial Deep Tissue Pressure Injury - Purple or maroon localized area of discolored intact skin or blood-filled blister due to damage of underlying soft tissue from pressure and/or shear. Purple 14 days  Pressure Injury 12/02/23 Heel Left Stage 2 -  Partial thickness  loss of dermis presenting as a shallow open injury with a red, pink wound bed without slough. 7 days    Wound care  Daily      Comments: Cleanse sacrum/buttocks/coccyx with Vashe wound cleanser [facility] [place] [phone])  Apply Medihoney daily on the wound bed. Cover with foam dressing, change every 3 days or PRN.  Pressure Injury 12/02/23 Heel Right Stage 2 -  Partial thickness loss of dermis presenting as a shallow open injury with a red, pink wound bed without slough.   Discharge wound care:   Complete by: As directed    Pressure Injury 11/25/23 Coccyx Left;Medial Deep Tissue Pressure Injury - Purple or maroon localized area of discolored intact skin or blood-filled blister due to damage of underlying soft tissue from pressure and/or shear. Purple 15 days    Pressure Injury 12/02/23 Heel Left Stage 2 -  Partial thickness loss of dermis presenting as a shallow open injury with a red, pink wound bed without slough. 8 days   Pressure Injury 12/02/23 Heel Right Stage  2 -  Partial thickness loss of dermis presenting as a shallow open injury with a red, pink wound bed without slough. 8 days   Increase activity slowly   Complete by: As directed        Allergies as of 12/11/2023   No Known Allergies      Medication List     STOP taking these medications    albuterol 108 (90 Base) MCG/ACT inhaler Commonly known as: VENTOLIN HFA   aspirin EC 81 MG tablet   benzonatate 100 MG capsule Commonly known as: TESSALON   Breztri Aerosphere 160-9-4.8 MCG/ACT Aero Generic drug: budeson-glycopyrrolate-formoterol   chlorpheniramine-HYDROcodone 10-8 MG/5ML Commonly known as: TUSSIONEX   cyclobenzaprine 5 MG tablet Commonly known as: FLEXERIL   dexamethasone 4 MG tablet Commonly known as: DECADRON   docusate sodium 100 MG capsule Commonly known as: COLACE   famotidine 40 MG tablet Commonly known as: PEPCID   gabapentin 100 MG capsule Commonly known as: NEURONTIN Replaced by: gabapentin 250 MG/5ML solution   HYDROcodone-acetaminophen 5-325 MG tablet Commonly known as: NORCO/VICODIN   ibuprofen 200 MG tablet Commonly known as: ADVIL   ipratropium-albuterol 0.5-2.5 (3) MG/3ML Soln Commonly known as: DUONEB   lisinopril-hydrochlorothiazide 10-12.5 MG tablet Commonly known as: ZESTORETIC   loratadine 10 MG tablet Commonly known as: CLARITIN   LORazepam 0.5 MG tablet Commonly known as: Ativan   nicotine polacrilex 2 MG gum Commonly known as: NICORETTE   nystatin 100000 UNIT/ML suspension Commonly known as: MYCOSTATIN   OHTUVAYRE IN   pantoprazole 20 MG tablet Commonly known as: PROTONIX Replaced by: pantoprazole 40 MG injection   Vitamin D 50 MCG (2000 UT) tablet       TAKE these medications    acetaminophen 500 MG tablet Commonly known as: TYLENOL Take 2 tablets (1,000 mg total) by mouth every 8 (eight) hours as needed for mild pain (pain score 1-3).   alum & mag hydroxide-simeth 200-200-20 MG/5ML suspension Commonly  known as: MAALOX/MYLANTA Place 15 mLs into feeding tube every 4 (four) hours as needed for indigestion or heartburn.   apixaban 5 MG Tabs tablet Commonly known as: ELIQUIS Place 1 tablet (5 mg total) into feeding tube 2 (two) times daily.   arformoterol 15 MCG/2ML Nebu Commonly known as: BROVANA Take 2 mLs (15 mcg total) by nebulization 2 (two) times daily.   budesonide 0.5 MG/2ML nebulizer solution Commonly known as: PULMICORT Take 2 mLs (0.5 mg total) by nebulization 2 (two)  times daily.   busPIRone 10 MG tablet Commonly known as: BUSPAR Place 1 tablet (10 mg total) into feeding tube 2 (two) times daily. What changed:  medication strength how much to take how to take this   Chlorhexidine Gluconate Cloth 2 % Pads Apply 6 each topically daily.   diclofenac Sodium 1 % Gel Commonly known as: VOLTAREN Apply 4 g topically 4 (four) times daily.   diphenoxylate-atropine 2.5-0.025 MG tablet Commonly known as: LOMOTIL Place 2 tablets into feeding tube 4 (four) times daily.   feeding supplement (PROSource TF20) liquid Place 60 mLs into feeding tube daily.   feeding supplement (VITAL 1.5 CAL) Liqd Place 1,000 mLs into feeding tube continuous.   nutrition supplement (JUVEN) Pack Place 1 packet into feeding tube 2 (two) times daily between meals.   ferrous sulfate 300 (60 Fe) MG/5ML syrup Place 5 mLs (300 mg total) into feeding tube daily.   fiber supplement (BANATROL TF) liquid Place 60 mLs into feeding tube 3 (three) times daily.   gabapentin 250 MG/5ML solution Commonly known as: NEURONTIN Place 2 mLs (100 mg total) into feeding tube every 12 (twelve) hours. Replaces: gabapentin 100 MG capsule   guaiFENesin 100 MG/5ML liquid Commonly known as: ROBITUSSIN Place 5 mLs into feeding tube every 6 (six) hours as needed for cough or to loosen phlegm.   leptospermum manuka honey Pste paste Apply 1 Application topically daily.   levalbuterol 0.63 MG/3ML nebulizer  solution Commonly known as: XOPENEX Take 3 mLs (0.63 mg total) by nebulization every 3 (three) hours as needed for wheezing or shortness of breath.   loperamide HCl 1 MG/7.5ML suspension Commonly known as: IMODIUM Place 30 mLs (4 mg total) into feeding tube 4 (four) times daily.   melatonin 5 MG Tabs Place 1 tablet (5 mg total) into feeding tube at bedtime as needed.   meropenem 1 g in sodium chloride 0.9 % 100 mL Inject 1 g into the vein every 12 (twelve) hours for 5 days.   midodrine 5 MG tablet Commonly known as: PROAMATINE Place 1 tablet (5 mg total) into feeding tube 3 (three) times daily with meals.   montelukast 10 MG tablet Commonly known as: SINGULAIR Place 1 tablet (10 mg total) into feeding tube at bedtime. What changed: how to take this   mouth rinse Liqd solution 15 mLs by Mouth Rinse route every 2 (two) hours.   nicotine 21 mg/24hr patch Commonly known as: NICODERM CQ - dosed in mg/24 hours Place 1 patch (21 mg total) onto the skin daily.   pantoprazole 40 MG injection Commonly known as: PROTONIX Inject 40 mg into the vein daily. Replaces: pantoprazole 20 MG tablet   revefenacin 175 MCG/3ML nebulizer solution Commonly known as: YUPELRI Take 3 mLs (175 mcg total) by nebulization daily.   scopolamine 1 MG/3DAYS Commonly known as: TRANSDERM-SCOP Place 1 patch (1.5 mg total) onto the skin every 3 (three) days. Start taking on: December 12, 2023   simethicone 40 MG/0.6ML drops Commonly known as: MYLICON Place 1.2 mLs (80 mg total) into feeding tube 4 (four) times daily as needed for flatulence (gas pains).   sodium chloride flush 0.9 % Soln Commonly known as: NS 10-40 mLs by Intracatheter route every 12 (twelve) hours.   sodium chloride flush 0.9 % Soln Commonly known as: NS 10-40 mLs by Intracatheter route as needed (flush).                 Follow-up appointment    Discharge Condition:  fair   Signed: Morene Crocker 12/11/2023,  10:52 AM

## 2023-12-10 NOTE — Progress Notes (Signed)
 NAME:  Jenny White, MRN:  [id number], DOB:  [birth year], LOS: 29 ADMISSION DATE:  11/11/2023, CONSULTATION DATE:  11/11/2023 REFERRING MD:  Dwain Sarna - CCS CHIEF COMPLAINT: Shock    History of Present Illness:  Jenny White 78 year old woman with a pmhx significant for  who presented to the emergency room with complaints of abdominal pain with N/V for the past week. She states the abdominal  pain got worsened after dinner. She reported experiencing nausea and  vomiting this morning.    In the ED  her labs were significant for a lactate 4.1 After fluid resuscitation, Lactate decreased to 2.5. CT scan showed high grade small bowel obstruction with transition point in the right lower quadrant along the distal ileum. Some trace of ascites and mesenteric stranding.Concern for bowel wall ischemia. Broad spectrum antibiotics ( zosyn) was started, blood cultures pending.    Patient was taken to the ER emergently with CCS 3/4 for Ex-lap / LOA. Intraoperative course  was notable for free fluid on entry, with closed loop bowel obstruction noted secondary to tight band adhesion. Patient left in discontinuity/ anastomosis not completed with open abdomen and plan for second look in 24-48 hrs. Patient remained intubated and was transferred to ICU in critical condition.   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 Elevated LA found to have SBO taken emergently to surgery for EX-LAP.  3/7 extubated reintubated 3/10 extubated again 3/11 OFF NIPPV 3/12 got ativan at HS. Sleepy and difficult to arouse all day.  3/13 transferred to ICU for obtundation. Re-intubated.  3/16 Extubated  3/17 zyprexa x 1 overnight,  Obtunded , failed  BiPAP, reintubated 3/17 several conversations with unhappy family members, offered transfer to different facility, called [facility]>> no beds 3/18 resumed BuSpar, gabapentin, stopped oxycodone & fent gtt since oversedated 3/19 Amiodarone IV extravasated LUE 3/20 dex, low dose NE, diuresing  3/21 tracheostomy placed at bedside 3/24 completed antibitoic therapy for aeromonal and hafnia alvei pneumonia with meropenem for 14 days 3/25 transferred to for ongoing care - back on ventilatory support 3/31 shock with hypothermia, bradycardia - c/f septic shock  Interim History / Subjective:  Doing better this AM. Mildly anxious but interactive. Denies abdominal pain or difficulty breathing. Had questions about her blood pressure. Having difficulty writing this AM because R arm tremor with anxiety  Objective   Blood pressure (!) 105/41, pulse 75, temperature 97.6 F (36.4 C), temperature source Oral, resp. rate (!) 21, height 5\' 7"  (1.702 m), weight 47.7 kg, SpO2 100%.    Vent Mode: PRVC FiO2 (%):  [40 %] 40 % Set Rate:  [24 bmp] 24 bmp Vt Set:  [490 mL] 490 mL PEEP:  [5 cmH20] 5 cmH20 Plateau Pressure:  [24 cmH20-27 cmH20] 26 cmH20   Intake/Output Summary (Last 24 hours) at 12/10/2023 0754 Last data filed at 12/10/2023 0700 Gross per 24 hour  Intake 1718.76 ml  Output 3450 ml  Net -1731.24 ml   Filed Weights   12/07/23 0500 12/09/23 0441 12/10/23 0427  Weight: 47.7 kg 47.7 kg 47.7 kg    Examination: General: Alert and lying in bed with mild distress, anxious Lungs: Trach in place with full support.  Good air movement bilaterally Abdomen:  output in ostomy bag. No tenderness with palpation of abdomen and suprapubic area. Extremities: 2+ pulses on bilateral lower extremities. Trace-1+ edema, unchanged Neuro: Awake, interacting. Moving all extremities following directions GU: Foley catheter  Significant labs: Na 126>133 K 5.2>4.8 BUN 82>72 Cr 0.87>0.71 (BL 0.5)  Hgb 7.8>7.3 WBC  16.6>12.9 net 9.9 MRSA PCR: not detected U/A: +hgb, RBCs, leukocytes, and bacteria  Culture: 3/31 urine:  <10,000 colonies with insignificant growth 3/31 blood: NGTD 3/31 Tracheal aspirate: GPC, GNR  Imaging: - 3/31 TTE: LVEF 65-70% LV with normal function and no RWMA. Elevated pulmonary artery pressure  Resolved Hospital Problem list   Hypotension - not longer on midodrine Encephalopathy Hyperkalemia  Assessment & Plan:   Shock Still requires vasopressor support, though improved s/p albumin resuscitation.  Intermittently hypothermic. Bradycardia has resolved. Urine, respiratory, and blood cultures still pending. GBPs and GNRs on sputum now. - Continue on meropenum 3/31-  MRSA PCR negative, will DC vanc 4/1 - On levophed for vasopressor support  Added midodrine 5 mg TID - Follow urine culture; collecting fungal blood culture - S/p 75g of albumin with autodiuresis; will redose today - TTE wo WMA; continue telemetry -- Stop Xanax; add to allergen list - Neuro checks q4 - Bladder scan qshift, no retention  Acute on chronic resp failure with hypoxia, hypercarbia Severe COPD -FEV1 30% RLL Cancer s/p SBRT S/p tracheostomy 3/21 ETT 3/4 >>3/7 >> 3/10, 3/13- 3/16, 3/17 >>3/21 -3/25 Patient has been unable to tolerate PS/TCT trial - Continue pressure-point ventilation as she can tolerate - Routine trach care - Guaifenesin scheduled - Pulmonary hygiene, PT/OT - No benzodiazepines for this person; associated with encephalopathy - Follow intermittent chest x-ray  Hyponatremia Improved. 134 today s/p 75g albumin and auto diuresis without lasix - CTM - Follow up AM Na with redosing Albumin  AKI Improving - Continue to follow after fluid resuscitation  High grade small bowel obstruction  Severe protein calorie malnutrition End ileostomy S/p ex lap 3/4 with resection and end ileostomy Hyperkalemia; resolved s/p modification of tube feeds  Stool output over 24hrs; of  note, tube feeds were stopped ~7 hrs yesterday - Per surgery, patient ok to go to [facility] with current therapy - Imodium 4 mg 4x daily - Lomotil 2 tablets 4x daily - Banatrol fiber 60mL TID - Scopolamine patch for nausea - Appreciate surgery recommendations - Qtc monitoring - Simethicone PRN - Now on Vital feeding supplement from [employer]; 34ml/hr  Anemia - Following CBC - Goal hemoglobin 7.0 - Normal ferritin, sat ratio 6 - Continue iron supplementation - If continues to downtrend, will get CT abdomen and pelvis for retroperitoneal bleed  Severe anxiety at baseline -Gabapentin -DecreaseBuSpar BID  -No benzodiazepines for this patient  HAP:  Aeromonas and Hafnia Alvei pneumonia > both S to meropenmem -Treated, completed 3/24    New onset paroxymal atrial fibrillation - NSR. - TTE 3/6 & 3/31 normal LVEF and no WMA -on DOAC; may need to reconsider if Hgb continues to drop -Optimize electrolytes  Diabetes type 2  -Following off SSI -CBGs with tube feeds    Deconditioning -PT/OT -Plan to [facility] once ostomy output close to 1 L daily  Best Practice (right click and "Reselect all SmartList Selections" daily)   Diet/type: tubefeeds DVT prophylaxis: DOAC GI prophylaxis: N/A Lines: Central line Foley:  Yes, and it is still needed Code Status:  full code Last date of multidisciplinary goals of care discussion [--]  Labs   CBC: Recent Labs  Lab 12/06/23 0523  12/06/23 1517 12/07/23 0505 12/08/23 0343 12/08/23 1223 12/08/23 1641 12/08/23 1830 12/09/23 0539 12/10/23 0516  WBC 15.5*  --  15.3* 16.8*  --   --   --  16.6* 12.9*  NEUTROABS  --   --   --   --   --   --   --  13.0* 9.9*  HGB 6.7*   < > 7.9* 8.0* 9.2* 8.8* 9.2* 7.8* 7.3*  HCT 21.8*   < > 25.7* 26.1* 27.0* 26.0* 27.0* 25.0* 23.3*  MCV 97.3  --  96.3 98.9  --   --   --  96.9 95.1  PLT 377  --  386 377  --   --   --  359 358   < > = values in this interval not displayed.    Basic Metabolic Panel: Recent  Labs  Lab 12/05/23 0508 12/06/23 0523 12/07/23 0505 12/08/23 0343 12/08/23 1223 12/08/23 1641 12/08/23 1830 12/09/23 0539 12/09/23 0719 12/09/23 1450 12/09/23 1821 12/09/23 2239 12/10/23 0202 12/10/23 0516  NA 139 136 134* 134*   < > 128* 127* 128*  --   --  126*  --   --  133*  K 3.7 3.8 4.6 4.8   < > 5.2* 5.3* 5.7*   < > 5.3* 5.2* 5.1 4.9 4.8  CL 103 102 103 102  --   --   --  98  --   --  96*  --   --  105  CO2 28 26 27 27   --   --   --  23  --   --  22  --   --  23  GLUCOSE 112* 127* 106* 112*  --   --   --  83  --   --  142*  --   --  101*  BUN 30* 39* 50* 61*  --   --   --  76*  --   --  82*  --   --  72*  CREATININE 0.51 0.54 0.62 0.69  --   --   --  0.87  --   --  0.87  --   --  0.71  CALCIUM 8.8* 9.0 9.2 9.5  --   --   --  9.3  --   --  9.2  --   --  9.7  MG 2.1 1.9 2.0 2.0  --   --   --  1.9  --   --   --   --   --   --   PHOS 3.4 3.5 2.9 3.6  --   --   --  3.7  --   --  3.2  --   --  3.2   < > = values in this interval not displayed.   GFR: Estimated Creatinine Clearance: 43.6 mL/min (by C-G formula based on SCr of 0.71 mg/dL). Recent Labs  Lab 12/07/23 0505 12/08/23 0343 12/08/23 1553 12/08/23 1839 12/08/23 2143 12/09/23 0539 12/10/23 0516  PROCALCITON  --   --  <0.10  --   --   --   --   WBC 15.3* 16.8*  --   --   --  16.6* 12.9*  LATICACIDVEN  --   --   --  0.5 0.7  --   --     Liver Function Tests: Recent Labs  Lab 12/07/23 0505 12/08/23 0343 12/09/23 0539 12/09/23 1821 12/10/23 0516  ALBUMIN 2.1* 2.1* 2.0* 3.1* 3.0*   No  results for input(s): "LIPASE", "AMYLASE" in the last 168 hours. No results for input(s): "AMMONIA" in the last 168 hours.  ABG    Component Value Date/Time   PHART 7.256 (L) 12/08/2023 1830   PCO2ART 54.3 (H) 12/08/2023 1830   PO2ART 121 (H) 12/08/2023 1830   HCO3 25.1 12/08/2023 1830   TCO2 27 12/08/2023 1830   ACIDBASEDEF 3.0 (H) 12/08/2023 1830   O2SAT 98 12/08/2023 1830     Coagulation Profile: No results for  input(s): "INR", "PROTIME" in the last 168 hours.  Cardiac Enzymes: No results for input(s): "CKTOTAL", "CKMB", "CKMBINDEX", "TROPONINI" in the last 168 hours.  HbA1C: Hgb A1c MFr Bld  Date/Time Value Ref Range Status  11/14/2023 03:35 PM 5.8 (H) 4.8 - 5.6 % Final    Comment:    (NOTE) Pre diabetes:          5.7%-6.4%  Diabetes:              >6.4%  Glycemic control for   <7.0% adults with diabetes     CBG: Recent Labs  Lab 12/09/23 1217 12/09/23 1251 12/09/23 1846 12/09/23 2340 12/10/23 0013  GLUCAP 64* 101* 98 55* 153*     Critical care time:     Morene Crocker, MD [facility] Internal Medicine Program - PGY-2 12/10/2023, 7:54 AM

## 2023-12-10 NOTE — TOC Transition Note (Addendum)
 Transition of Care [facility]) - Discharge Note   Patient Details  Name: Jenny White MRN: [id number] Date of Birth: 1946-07-06  Transition of Care [facility]) CM/SW Contact:  Lawerance Sabal, RN Phone Number: 12/10/2023, 2:23 PM   Clinical Narrative:     Patient to transition to [facility] on campus today, per medical team and [facility] liaison Ciera.  Accepting provider is [facility]. MD to MD report can be called to [phone]. RN to RN report can be called to the same number.  The staff at [facility] will call the unit and provide the room number as well as bed readiness for transport.     Barriers to Discharge: Continued Medical Work up   Patient Goals and CMS Choice   CMS Medicare.gov Compare Post Acute Care list provided to:: Patient Represenative (must comment) (Daughter in [place]) Choice offered to / list presented to : Adult Children      Discharge Placement                       Discharge Plan and Services Additional resources added to the After Visit Summary for     Discharge Planning Services: CM Consult Post Acute Care Choice: Long Term Acute Care (LTAC)                               Social Drivers of Health (SDOH) Interventions SDOH Screenings   Food Insecurity: No Food Insecurity (11/11/2023)  Housing: Low Risk  (11/11/2023)  Transportation Needs: No Transportation Needs (11/11/2023)  Utilities: Not At Risk (11/11/2023)  Depression (PHQ2-9): Low Risk  (06/17/2023)  Financial Resource Strain: Low Risk  (11/06/2023)   Received from [facility]  Social Connections: Unknown (11/11/2023)  Tobacco Use: Medium Risk (11/11/2023)     Readmission Risk Interventions     No data to display

## 2023-12-10 NOTE — Progress Notes (Signed)
 SLP Cancellation Note  Patient Details Name: LALITA EBEL MRN: [id number] DOB: March 18, 1946   Cancelled evaluation: Pt not ready for PMV eval per PCCM - SLP will follow along for readiness.  Sheniqua Carolan L. Samson Frederic, MA CCC/SLP Clinical Specialist - Acute Care SLP Acute Rehabilitation Services Office number [phone]           Blenda Mounts Laurice 12/10/2023, 10:23 AM

## 2023-12-10 NOTE — Plan of Care (Signed)
  Problem: Clinical Measurements: Goal: Diagnostic test results will improve Outcome: Progressing   Problem: Activity: Goal: Risk for activity intolerance will decrease Outcome: Progressing   Problem: Pain Managment: Goal: General experience of comfort will improve and/or be controlled Outcome: Progressing   Problem: Safety: Goal: Ability to remain free from injury will improve Outcome: Progressing   Problem: Activity: Goal: Ability to tolerate increased activity will improve Outcome: Progressing

## 2023-12-10 NOTE — Progress Notes (Signed)
 27 Days Post-Op   Subjective/Chief Complaint: Awake, alert   Objective: Vital signs in last 24 hours: Temp:  [96.4 F (35.8 C)-97.9 F (36.6 C)] 97.6 F (36.4 C) (04/02 0339) Pulse Rate:  [57-82] 78 (04/02 0615) Resp:  [15-33] 23 (04/02 0615) BP: (62-126)/(31-114) 114/45 (04/02 0615) SpO2:  [100 %] 100 % (04/02 0615) FiO2 (%):  [40 %] 40 % (04/02 0400) Weight:  [47.7 kg] 47.7 kg (04/02 0427) Last BM Date : 12/09/23  Intake/Output from previous day: 04/01 0701 - 04/02 0700 In: 1661.8 [I.V.:520.2; NG/GT:536; IV Piggyback:605.6] Out: 3450 [Urine:2600; Stool:850] Intake/Output this shift: No intake/output data recorded.  Ab soft nontender incision unchanged, ostomy pink functional thicker  Lab Results:  Recent Labs    12/09/23 0539 12/10/23 0516  WBC 16.6* 12.9*  HGB 7.8* 7.3*  HCT 25.0* 23.3*  PLT 359 358   BMET Recent Labs    12/09/23 1821 12/09/23 2239 12/10/23 0202 12/10/23 0516  NA 126*  --   --  133*  K 5.2*   < > 4.9 4.8  CL 96*  --   --  105  CO2 22  --   --  23  GLUCOSE 142*  --   --  101*  BUN 82*  --   --  72*  CREATININE 0.87  --   --  0.71  CALCIUM 9.2  --   --  9.7   < > = values in this interval not displayed.   PT/INR No results for input(s): "LABPROT", "INR" in the last 72 hours. ABG Recent Labs    12/08/23 1641 12/08/23 1830  PHART 7.153* 7.256*  HCO3 25.4 25.1    Studies/Results: VAS [place] UPPER EXTREMITY VENOUS DUPLEX Result Date: 12/09/2023 UPPER VENOUS STUDY  Patient Name:  Jenny White  Date of Exam:   12/09/2023 Medical Rec #: [id number]   Accession #:    [id number] Date of Birth: 1946-05-03    Patient Gender: F Patient Age:   78 years Exam Location:  [facility] Procedure:      VAS [place] UPPER EXTREMITY VENOUS DUPLEX Referring Phys: Melody Comas --------------------------------------------------------------------------------  Indications: Rule out VTE, Edema, and Post-op Other Indications: Lung cancer. Comparison Study: No prior  exam. Performing Technologist: Fernande Bras  Examination Guidelines: A complete evaluation includes B-mode imaging, spectral Doppler, color Doppler, and power Doppler as needed of all accessible portions of each vessel. Bilateral testing is considered an integral part of a complete examination. Limited examinations for reoccurring indications may be performed as noted.  Right Findings: +----------+------------+---------+-----------+----------+-------+ RIGHT     CompressiblePhasicitySpontaneousPropertiesSummary +----------+------------+---------+-----------+----------+-------+ Subclavian    Full       Yes       Yes                      +----------+------------+---------+-----------+----------+-------+  Left Findings: +----------+------------+---------+-----------+----------+-------+ LEFT      CompressiblePhasicitySpontaneousPropertiesSummary +----------+------------+---------+-----------+----------+-------+ IJV           Full       Yes       Yes                      +----------+------------+---------+-----------+----------+-------+ Subclavian    Full       Yes       Yes                      +----------+------------+---------+-----------+----------+-------+ Axillary      Full  Yes       Yes                      +----------+------------+---------+-----------+----------+-------+ Brachial      Full       Yes       Yes                      +----------+------------+---------+-----------+----------+-------+ Radial        Full                                          +----------+------------+---------+-----------+----------+-------+ Ulnar         Full                                          +----------+------------+---------+-----------+----------+-------+ Cephalic      Full       Yes       Yes                      +----------+------------+---------+-----------+----------+-------+ Basilic       Full       Yes       Yes                       +----------+------------+---------+-----------+----------+-------+  Summary:  Right: No evidence of thrombosis in the subclavian.  Left: No evidence of deep vein thrombosis in the upper extremity. No evidence of superficial vein thrombosis in the upper extremity.  *See table(s) above for measurements and observations.  Diagnosing physician: Lemar Livings MD Electronically signed by Lemar Livings MD on 12/09/2023 at 4:46:58 PM.    Final    DG Abd 1 View Result Date: 12/09/2023 CLINICAL DATA:  Abdominal pain and vomiting. EXAM: ABDOMEN - 1 VIEW COMPARISON:  11/28/2020 FINDINGS: No gaseous bowel dilatation to suggest obstruction. Feeding tube is identified with transpyloric tip placement in the distal descending duodenum. Bones appear diffusely demineralized. IMPRESSION: 1. No gaseous bowel dilatation. 2. Feeding tube tip is in the proximal to mid duodenum. Electronically Signed   By: Kennith Center M.D.   On: 12/09/2023 09:17   [place] EKG SITE RITE Result Date: 12/08/2023 If Site Rite image not attached, placement could not be confirmed due to current cardiac rhythm.  ECHOCARDIOGRAM LIMITED Result Date: 12/08/2023    ECHOCARDIOGRAM LIMITED REPORT   Patient Name:   Jenny White Date of Exam: 12/08/2023 Medical Rec #:  [id number]  Height:       67.0 in Accession #:    [id number] Weight:       105.2 lb Date of Birth:  [birth year]   BSA:          1.539 m Patient Age:    77 years   BP:           115/39 mmHg Patient Gender: F          HR:           52 bpm. Exam Location:  Inpatient Procedure: Limited Echo, Color Doppler and Cardiac Doppler (Both Spectral and            Color Flow Doppler were utilized during procedure). Indications:    R94.31 Abnormal EKG  History:  Patient has prior history of Echocardiogram examinations, most                 recent 11/13/2023. COPD, Arrythmias:Atrial Fibrillation; Risk                 Factors:Hypertension.  Sonographer:    Irving Burton Senior RDCS Referring Phys: [id number] Martina Sinner   Sonographer Comments: Technically difficult study, supine on artificial respirator IMPRESSIONS  1. Left ventricular ejection fraction, by estimation, is 65 to 70%. The left ventricle has normal function. The left ventricle has no regional wall motion abnormalities.  2. Right ventricular systolic function is normal. The right ventricular size is normal. There is moderately elevated pulmonary artery systolic pressure. The estimated right ventricular systolic pressure is 52.4 mmHg.  3. The mitral valve is abnormal. Mild to moderate mitral valve regurgitation. No evidence of mitral stenosis.  4. Aortic valve sclerosis/calcification is present, without any evidence of aortic stenosis.  5. The inferior vena cava is dilated in size with >50% respiratory variability, suggesting right atrial pressure of 8 mmHg. Comparison(s): No significant change from prior study. FINDINGS  Left Ventricle: Left ventricular ejection fraction, by estimation, is 65 to 70%. The left ventricle has normal function. The left ventricle has no regional wall motion abnormalities. The left ventricular internal cavity size was normal in size. Right Ventricle: The right ventricular size is normal. No increase in right ventricular wall thickness. Right ventricular systolic function is normal. There is moderately elevated pulmonary artery systolic pressure. The tricuspid regurgitant velocity is 3.33 m/s, and with an assumed right atrial pressure of 8 mmHg, the estimated right ventricular systolic pressure is 52.4 mmHg. Mitral Valve: The mitral valve is abnormal. There is mild thickening of the mitral valve leaflet(s). Mild to moderate mitral valve regurgitation. No evidence of mitral valve stenosis. Tricuspid Valve: The tricuspid valve is grossly normal. Tricuspid valve regurgitation is mild. Aortic Valve: Aortic valve sclerosis/calcification is present, without any evidence of aortic stenosis. Pulmonic Valve: The pulmonic valve was grossly normal.  Pulmonic valve regurgitation is mild. Venous: The inferior vena cava is dilated in size with greater than 50% respiratory variability, suggesting right atrial pressure of 8 mmHg. Additional Comments: Spectral Doppler performed. Color Doppler performed.  RIGHT VENTRICLE RV S prime:     10.90 cm/s TAPSE (M-mode): 2.3 cm AORTIC VALVE LVOT Vmax:   85.50 cm/s LVOT Vmean:  57.300 cm/s LVOT VTI:    0.160 m TRICUSPID VALVE TR Peak grad:   44.4 mmHg TR Vmax:        333.00 cm/s  SHUNTS Systemic VTI: 0.16 m Rachelle Hora Croitoru MD Electronically signed by Thurmon Fair MD Signature Date/Time: 12/08/2023/4:32:37 PM    Final    DG CHEST PORT 1 VIEW Result Date: 12/08/2023 CLINICAL DATA:  Respiratory distress. EXAM: PORTABLE CHEST 1 VIEW COMPARISON:  Chest radiograph dated 12/03/2023. FINDINGS: Tracheostomy in similar position. Feeding tube extends below the diaphragm with tip beyond the margin of the image. Small bilateral pleural effusions and bibasilar atelectasis or infiltrate. No pneumothorax. Stable cardiac silhouette. No acute osseous pathology. IMPRESSION: Small bilateral pleural effusions and bibasilar atelectasis or infiltrate. Electronically Signed   By: Elgie Collard M.D.   On: 12/08/2023 14:02    Anti-infectives: Anti-infectives (From admission, onward)    Start     Dose/Rate Route Frequency Ordered Stop   12/09/23 2000  vancomycin (VANCOREADY) IVPB 750 mg/150 mL  Status:  Discontinued        750 mg 150 mL/hr over 60 Minutes Intravenous  Every 24 hours 12/08/23 1821 12/09/23 1006   12/08/23 1915  vancomycin (VANCOCIN) IVPB 1000 mg/200 mL premix        1,000 mg 200 mL/hr over 60 Minutes Intravenous STAT 12/08/23 1821 12/08/23 1941   12/08/23 1600  meropenem (MERREM) 1 g in sodium chloride 0.9 % 100 mL IVPB        1 g 200 mL/hr over 30 Minutes Intravenous Every 12 hours 12/08/23 1522     11/25/23 1145  meropenem (MERREM) 1 g in sodium chloride 0.9 % 100 mL IVPB        1 g 200 mL/hr over 30 Minutes  Intravenous Every 12 hours 11/25/23 1045 12/01/23 2100   11/20/23 1045  tobramycin (PF) (TOBI) nebulizer solution 300 mg  Status:  Discontinued        300 mg Nebulization 2 times daily 11/20/23 0952 11/23/23 1133   11/17/23 1000  meropenem (MERREM) 1 g in sodium chloride 0.9 % 100 mL IVPB  Status:  Discontinued        1 g 200 mL/hr over 30 Minutes Intravenous Every 12 hours 11/17/23 0832 11/23/23 1133   11/15/23 1400  piperacillin-tazobactam (ZOSYN) IVPB 3.375 g  Status:  Discontinued        3.375 g 100 mL/hr over 30 Minutes Intravenous Every 8 hours 11/15/23 1223 11/17/23 0832   11/11/23 2000  piperacillin-tazobactam (ZOSYN) IVPB 3.375 g  Status:  Discontinued        3.375 g 12.5 mL/hr over 240 Minutes Intravenous Every 8 hours 11/11/23 1233 11/15/23 1223   11/11/23 1245  piperacillin-tazobactam (ZOSYN) IVPB 3.375 g        3.375 g 100 mL/hr over 30 Minutes Intravenous  Once 11/11/23 1233 11/11/23 1316       Assessment/Plan: POD 28/26 S/P ex-lap small bowel resection, left open; ex-lap, small bowel resection, ileostomy and closure, Dr. Dwain Sarna for SBO - ileostomy output 850 ml/24h - Continue current regimen -fine for dc to ltac -dont think she needs tpn   FEN: post pyloric Tfs (continue post pyloric due to aspiration risk) ID: none VTE: eliquis bid    Emelia Loron 12/10/2023

## 2023-12-10 NOTE — Progress Notes (Signed)
 Occupational Therapy Treatment Patient Details Name: Jenny White MRN: [id number] DOB: [birth year] Today's Date: 12/10/2023   History of present illness 78 yo female presenting with n/v on 3/4. CT(+) SBO. 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 Trach placed 3/21. 3/25 Transfer to ICU and placed on Vent. PMH COPD, GERD, nephrolithiasis, HTN, OSA, lung CA (05/2023) s/p radiation, arthritis, spinal stenosis L3-4 fusion.   OT comments  Pt is making fair progress towards acute OT goals. Pt continues to be limited by deficits listed below. Pt with improvement in following verbal commands and alertness on this date. Overall, pt required up to TOTAL A+2 for bed mobility tasks. Pt tolerated sitting EOB ~3 minutes with MAX A +2 for safety to improve core strength and static sitting balance. Pt with reports of abdominal discomfort while seated upright and repositioned to supine for comfort. Pt completed grooming task using suction toothbrush with MOD A for thoroughness at bed level. OT will continue to follow pt acutely to address functional needs with discharge recommendations to continue therapy at [facility].     40% FiO2 5PEEP SBP above 95   If plan is discharge home, recommend the following:  Two people to help with walking and/or transfers;A lot of help with bathing/dressing/bathroom;Assistance with cooking/housework;Assistance with feeding;Direct supervision/assist for medications management;Direct supervision/assist for financial management;Assist for transportation;Help with stairs or ramp for entrance;Supervision due to cognitive status   Equipment Recommendations  Other (comment)    Recommendations for Other Services Rehab consult    Precautions / Restrictions Precautions Precautions: Fall;Other (comment) Recall of Precautions/Restrictions: Impaired Precaution/Restrictions Comments: ostomy, trach, midline abd incision, cortrak,  watch sats and BP Restrictions Weight Bearing Restrictions Per Provider Order: No       Mobility Bed Mobility Overal bed mobility: Needs Assistance Bed Mobility: Supine to Sit, Sit to Supine     Supine to sit: +2 for physical assistance, HOB elevated, Used rails, Max assist Sit to supine: +2 for physical assistance, HOB elevated, Used rails, Total assist   General bed mobility comments: Pt required assistance bringing BLE OOB, facilitating trunk to upright position, and positioning of hips to increase correct joint alignment while seated EOB.    Transfers Overall transfer level: Needs assistance                 General transfer comment: DNT for safety concerns; Pt requiring MAX A +2 for safety to maintain balance while seatd EOB. Pt with decreased activity tolerance and reports of abdominal pain while seated EOB.     Balance Overall balance assessment: Needs assistance Sitting-balance support: Bilateral upper extremity supported, Feet supported Sitting balance-Leahy Scale: Poor Sitting balance - Comments: Pt with posterior lean, requiring MAX A to maintain balance. Pt symptomic seated upright at EOB but BP stable throughout Postural control: Posterior lean                                 ADL either performed or assessed with clinical judgement   ADL Overall ADL's : Needs assistance/impaired     Grooming: Oral care;Moderate assistance Grooming Details (indicate cue type and reason): Using suction toothbrush, completed at bed level Pt required MOD A for thoroughness. Pt fatigues quickly                             Functional mobility during ADLs: +2  for physical assistance;Total assistance General ADL Comments: Pt able to point to indicate wanting oral swab. Pt fatigued quickly with grooming task at bed level.    Extremity/Trunk Assessment Upper Extremity Assessment Upper Extremity Assessment: Generalized weakness;LUE  deficits/detail;Difficult to assess due to impaired cognition LUE Deficits / Details: swelling observed   Lower Extremity Assessment Lower Extremity Assessment: Defer to PT evaluation        Vision   Vision Assessment?: No apparent visual deficits   Perception Perception Perception: Not tested   Praxis Praxis Praxis: Not tested   Communication Communication Communication: Impaired Factors Affecting Communication: Trach/intubated   Cognition Arousal: Alert Behavior During Therapy: Flat affect Cognition: Difficult to assess Difficult to assess due to: Tracheostomy           OT - Cognition Comments: Pt used head gestures to answer "yes" "no" questions. Pt able to point to areas of pain. Pt followed 1-step verbal commands with increased time.                 Following commands: Impaired Following commands impaired: Follows one step commands with increased time      Cueing   Cueing Techniques: Verbal cues, Gestural cues, Tactile cues  Exercises      Shoulder Instructions       General Comments VSS on trach vent 40% 5PEEP; SBP above 95 throughout    Pertinent Vitals/ Pain       Pain Assessment Pain Assessment: Faces Faces Pain Scale: Hurts little more Pain Location: abdomen, B feet Pain Descriptors / Indicators: Discomfort Pain Intervention(s): Monitored during session, Limited activity within patient's tolerance  Home Living                                          Prior Functioning/Environment              Frequency  Min 1X/week        Progress Toward Goals  OT Goals(current goals can now be found in the care plan section)  Progress towards OT goals: Progressing toward goals  Acute Rehab OT Goals Patient Stated Goal: none stated OT Goal Formulation: Patient unable to participate in goal setting Time For Goal Achievement: 12/15/23 Potential to Achieve Goals: Good ADL Goals Pt Will Perform Grooming: with  supervision;sitting Pt Will Perform Upper Body Dressing: with min assist;sitting Pt Will Perform Lower Body Dressing: with mod assist;sit to/from stand;sitting/lateral leans Pt Will Transfer to Toilet: with mod assist;stand pivot transfer;bedside commode Pt Will Perform Toileting - Clothing Manipulation and hygiene: with mod assist;sitting/lateral leans;sit to/from stand Additional ADL Goal #1: Pt will demonstrate improved activity tolerance by completing functional task of choice for 10 minutes while seated EOB without visible signs of fatigue Additional ADL Goal #2: Pt will complete bed mobility tasks with MOD A as a precursor to OOB ADLs  Plan      Co-evaluation                 AM-PAC OT "6 Clicks" Daily Activity     Outcome Measure   Help from another person eating meals?: Total Help from another person taking care of personal grooming?: A Little Help from another person toileting, which includes using toliet, bedpan, or urinal?: Total Help from another person bathing (including washing, rinsing, drying)?: A Lot Help from another person to put on and taking off regular upper body clothing?: A Lot  Help from another person to put on and taking off regular lower body clothing?: Total 6 Click Score: 10    End of Session Equipment Utilized During Treatment: Oxygen  OT Visit Diagnosis: Unsteadiness on feet (R26.81);Other abnormalities of gait and mobility (R26.89);Muscle weakness (generalized) (M62.81);History of falling (Z91.81)   Activity Tolerance Patient limited by fatigue   Patient Left in bed;with call bell/phone within reach;with bed alarm set;with family/visitor present   Nurse Communication Mobility status        Time: 1121-1150 OT Time Calculation (min): 29 min  Charges: OT General Charges $OT Visit: 1 Visit OT Treatments $Self Care/Home Management : 8-22 mins $Therapeutic Activity: 8-22 mins  [address] [facility]  Tyyne Cliett 12/10/2023, 2:03 PM

## 2023-12-10 NOTE — Discharge Summary (Incomplete Revision)
 Physician Discharge Summary         Patient ID: Jenny White MRN: [id number] DOB/AGE: 01/28/1946 78 y.o.  Admit date: 11/11/2023 Discharge date: 12/11/2023  Discharge Diagnoses:    Active Hospital Problems   Diagnosis Date Noted   S/P exploratory laparotomy 11/11/2023   Chronic respiratory failure with hypoxia (HCC) 12/07/2023   Acute on chronic respiratory failure with hypoxia (HCC) 12/05/2023   Pressure injury of skin 12/05/2023   Protein-calorie malnutrition, severe 11/22/2023   Acute respiratory failure with hypoxia (HCC) 11/21/2023   Small bowel obstruction due to adhesions (HCC) 11/19/2023   Aspiration pneumonia (HCC) 11/19/2023   Septic shock (HCC) 11/19/2023   Paroxysmal atrial fibrillation (HCC) 11/19/2023   AKI (acute kidney injury) (HCC) 11/19/2023   Unspecified protein-calorie malnutrition (HCC) 11/19/2023   Shock (HCC) 11/11/2023    Resolved Hospital Problems  No resolved problems to display.      Discharge summary   Jenny White 78 year old woman with a pmhx significant for COPD, GERD, nephrolithiasis , HTN, OSA, Lung cancer ( Adenocarcinoma, diagnosed 05/2023) who presented to the emergency room with complaints of abdominal pain with N/V for the past week. She states the abdominal  pain got worsened after dinner. She reported experiencing nausea and  vomiting this morning.    In the ED  her labs were significant for a lactate 4.1 After fluid resuscitation, Lactate decreased to 2.5. CT scan showed high grade small bowel obstruction with transition point in the right lower quadrant along the distal ileum. Some trace of ascites and mesenteric stranding.Concern for bowel wall ischemia. Broad spectrum antibiotics ( zosyn) was started, blood cultures pending.    Patient was taken to the ER emergently with CCS 3/4 for Ex-lap / LOA. Intraoperative course  was notable for free fluid on entry, with closed loop bowel obstruction noted secondary to tight band adhesion. Patient left in  discontinuity/ anastomosis not completed with open abdomen and plan for second look in 24-48 hrs. Patient remained intubated and was transferred to ICU in critical condition. Her course was complicated by aspiration pneumonia to aeromonas and hafnia alvei; she completed a couse of meropenem.   Jenny White was initially extubated on 3/10. However, patient was oversedated and was transferred to ICU on 3/13 requiring reintubation. , extubated 3/16. On 3/17, patient received zyprexa overnight and was encephalophatic and tachypneic not responsive to BiPAP; she was re-intubated on 3/17 with patient's family support.  After three unsuccessful extubations, PCCM team discussed GOC with family as they initially did not want patient to be transfer to a facility but wanted to give patient to have a chance to wean off vent; PCCM discussed the need for long-term rehab needs after tracheostomy placement. Family ultimately approved and tracheostomy was placed at bedside on 3/21.   Patient's course was further complicated by severe anxiety, high output from ostomy site with several changes to tube feeding brands and antidiarrheal medications to achieve ostomy output goal of <1L. On 3/31, patient became hypotensive, bradycardic, and hypothermic. Initially encephalopathy s/p xanax, improved. Patient was started on empiric coverage for hospital acquired pneumonia and uti given high sediment in urine causing foley catheter kinks and urinary retention. At day of discharge, patient's blood, urine, and sputum cultures have not finalized. Patient is in fair state, now on low dose levophed and being transitioned to midodrine 5 mg TID.   Significant Hospital Events: Including procedures, antibiotic start and stop dates in addition to other pertinent events  3/4: Presented to ED with abdominal pain, N/V. Elevated LA found to have SBO taken emergently to surgery for EX-LAP.  3/7 extubated reintubated 3/10  extubated again 3/11 OFF NIPPV 3/12 got ativan at HS. Sleepy and difficult to arouse all day.  3/13 transferred to ICU for obtundation. Re-intubated.  3/16 Extubated  3/17 zyprexa x 1 overnight,  Obtunded , failed BiPAP, reintubated 3/17 several conversations with unhappy family members, offered transfer to different facility, called [facility]>> no beds 3/18 resumed BuSpar, gabapentin, stopped oxycodone & fent gtt since oversedated 3/18 Re-started cefepime for continued fever and leukocytosis 3/19 Amiodarone IV extravasated LUE 3/20 dex, low dose NE, diuresing  3/21 tracheostomy placed at bedside 3/23-24 failed TCT secondary to anxiety 3/24 completed antibitoic therapy for aeromonal and hafnia alvei pneumonia with meropenem for 14 days 3/24 Increased ostomy output >2L; General surgery guiding fiber and antidiarrheal medications  3/25 Transferred to for ongoing care - back on ventilatory support 3/31 Discontinued xanax as patient was hypotensive 3/31 shock with hypothermia, bradycardia - c/f septic shock; started on Cefepime and vancomycin. Foley cath exchanged 4/1 Discussion with daughter-in-law who is not happy with care.  4/1 Ongoing conversations with general surgery about tube feeds and ostomy output volume. Changed tube feeds 4/2 transitioned to 5 mg TID midodrine to lower levophed needs 3/31-4/2 no TCT due to severe anxiety 3/31 -4/2 decreased ostomy output <1.5L   Discharge Plan by Active Problems    Shock Improved s/p albumin resuscitation. Now off levophed.  Intermittently hypothermic. Bradycardia has resolved. Urine, respiratory, and blood cultures still pending. GBPs and GNRs on sputum now. Was initially started on Vancomycin but discontinued as MRSA PCR negative. Plan: - Continue on meropenem 3/31-4/7 for HAP and UTI - Follow urine, blood, and sputum cultures - Continue midodrine 5 mg TID - Patient responsive to Albumin therapy; will re-dose today - TTE wo WMA;  continue telemetry - No benzodiazepines or zyprexa for this patient as it causes severe sedation - Bladder scan qshift for concern of urinary retention  Acute on chronic resp failure with hypoxia, hypercarbia Severe COPD -FEV1 30% RLL Cancer s/p SBRT S/p tracheostomy 3/21 ETT 3/4 >>3/7 >> 3/10, 3/13- 3/16, 3/17 >>3/21 -3/25 Patient has been unable to tolerate PS/TCT trial due to severe anxiety with limited medications at this time. Currently being treated for HAP as above Plan: - Continue pressure-point ventilation as she can tolerate - Routine trach care - Guaifenesin PRN if increased secretions - Pulmonary hygiene - No benzodiazepines for this person; associated with encephalopathy  High grade small bowel obstruction  Severe protein calorie malnutrition End ileostomy S/p ex lap 3/4 with resection and end ileostomy Hyperkalemia; resolved s/p modification of tube feeds  Stool output 1.4L over 24hrs - Per surgery, patient ok to go to [facility] with current therapy - Imodium 4 mg 4x daily - Lomotil 2 tablets 4x daily - Banatrol fiber 60mL TID - Appreciate surgery recommendations - Monitor Qtc with EKG - On Vital feeding supplement from [employer]; 5ml/hr - Monitor CMP, magnesium, and phosphorus - Simethicone PRN - Scopolamine patch for nausea - AVOID Qtc prolonging medications given high doses of Imodium   Severe anxiety at baseline - Gabapentin and Buspar - No benzodiazepines for this patient  Hyponatremia, resolved Improved. 139 today - CTM - Follow up AM Na with redosing Albumin  Anemia No overt blood loss - Following CBC - Goal hemoglobin 7.0 - Normal ferritin, sat ratio 6 - Continue iron supplementation - If continues to downtrend, may  need to hold Eliquis  New onset paroxymal atrial fibrillation - NSR. - TTE 3/6 & 3/31 normal LVEF and no WMA - On DOAC; monitor Hgb - Optimize electrolytes  Diabetes type 2  - Following off SSI - CBGs with tube feeds    Social: Patient's daughter-in-law, Shaquoya Cosper, and patient's son, Zaley Talley and in charge of patient's care. Patient is Full code, full scope of care as last conversation on 12/08/2024.  Significant Hospital tests/ studies      Latest Ref Rng & Units 12/11/2023   12:19 AM 12/10/2023    5:16 AM 12/10/2023    2:02 AM  BMP  Glucose 70 - 99 mg/dL 161  096    BUN 8 - 23 mg/dL 65  72    Creatinine 0.45 - 1.00 mg/dL 4.09  8.11    Sodium 914 - 145 mmol/L 139  133    Potassium 3.5 - 5.1 mmol/L 5.1  4.8  4.9   Chloride 98 - 111 mmol/L 109  105    CO2 22 - 32 mmol/L 23  23    Calcium 8.9 - 10.3 mg/dL 78.2  9.7        Latest Ref Rng & Units 12/11/2023   12:19 AM 12/10/2023    5:16 AM 12/09/2023    5:39 AM  CBC  WBC 4.0 - 10.5 K/uL 10.9  12.9  16.6   Hemoglobin 12.0 - 15.0 g/dL 7.0  7.3  7.8   Hematocrit 36.0 - 46.0 % 21.9  23.3  25.0   Platelets 150 - 400 K/uL 331  358  359     Recent Results (from the past 240 hours)  Culture, Respiratory w Gram Stain     Status: None (Preliminary result)   Collection Time: 12/08/23  2:51 PM   Specimen: Tracheal Aspirate; Respiratory  Result Value Ref Range Status   Specimen Description TRACHEAL ASPIRATE  Final   Special Requests NONE  Final   Gram Stain   Final    NO WBC SEEN RARE YEAST WITH PSEUDOHYPHAE RARE GRAM POSITIVE COCCI RARE GRAM NEGATIVE RODS    Culture   Final    CULTURE REINCUBATED FOR BETTER GROWTH Performed at [facility], [address] [place], [place] [address]    Report Status PENDING  Incomplete  MRSA Next Gen by PCR, Nasal     Status: None   Collection Time: 12/08/23  3:53 PM   Specimen: Nasal Mucosa; Nasal Swab  Result Value Ref Range Status   MRSA by PCR Next Gen NOT DETECTED NOT DETECTED Final    Comment: (NOTE) The GeneXpert MRSA Assay (FDA approved for NASAL specimens only), is one component of a comprehensive MRSA colonization surveillance program. It is not intended to diagnose MRSA infection nor to guide or  monitor treatment for MRSA infections. Test performance is not FDA approved in patients less than 68 years old. Performed at [facility], [address] [place], [place] [address]   Culture, blood (Routine X 2) w Reflex to ID Panel     Status: None (Preliminary result)   Collection Time: 12/08/23  4:18 PM   Specimen: BLOOD LEFT ARM  Result Value Ref Range Status   Specimen Description BLOOD LEFT ARM  Final   Special Requests   Final    BOTTLES DRAWN AEROBIC ONLY Blood Culture results may not be optimal due to an inadequate volume of blood received in culture bottles   Culture   Final  NO GROWTH 3 DAYS Performed at [facility], [address] [place], [place] [address]    Report Status PENDING  Incomplete  Remove and replace urinary cath (placed > 5 days) then obtain urine culture from new indwelling urinary catheter.     Status: Abnormal   Collection Time: 12/09/23 11:37 AM   Specimen: Urine, Catheterized  Result Value Ref Range Status   Specimen Description URINE, CATHETERIZED  Final   Special Requests NONE  Final   Culture (A)  Final    <10,000 COLONIES/mL INSIGNIFICANT GROWTH Performed at [facility], [address], [place], [place] [address]    Report Status 12/10/2023 FINAL  Final  Fungus culture, blood     Status: None (Preliminary result)   Collection Time: 12/09/23 11:52 AM   Specimen: BLOOD  Result Value Ref Range Status   Specimen Description BLOOD SITE NOT SPECIFIED  Final   Special Requests   Final    BOTTLES DRAWN AEROBIC AND ANAEROBIC Blood Culture results may not be optimal due to an inadequate volume of blood received in culture bottles   Culture   Final    NO GROWTH 2 DAYS Performed at [facility], [address] [place], [place] [address]    Report Status PENDING  Incomplete    VAS [place] UPPER EXTREMITY VENOUS DUPLEX Result Date: 12/09/2023 UPPER VENOUS STUDY  Patient Name:  Jenny White  Date of Exam:   12/09/2023 Medical Rec #:  [id number]   Accession #:    [id number] Date of Birth: May 23, 1946    Patient Gender: F Patient Age:   19 years Exam Location:  [facility] Procedure:      VAS [place] UPPER EXTREMITY VENOUS DUPLEX Referring Phys: Melody Comas --------------------------------------------------------------------------------  Indications: Rule out VTE, Edema, and Post-op Other Indications: Lung cancer. Comparison Study: No prior exam. Performing Technologist: Fernande Bras  Examination Guidelines: A complete evaluation includes B-mode imaging, spectral Doppler, color Doppler, and power Doppler as needed of all accessible portions of each vessel. Bilateral testing is considered an integral part of a complete examination. Limited examinations for reoccurring indications may be performed as noted.  Right Findings: +----------+------------+---------+-----------+----------+-------+ RIGHT     CompressiblePhasicitySpontaneousPropertiesSummary +----------+------------+---------+-----------+----------+-------+ Subclavian    Full       Yes       Yes                      +----------+------------+---------+-----------+----------+-------+  Left Findings: +----------+------------+---------+-----------+----------+-------+ LEFT      CompressiblePhasicitySpontaneousPropertiesSummary +----------+------------+---------+-----------+----------+-------+ IJV           Full       Yes       Yes                      +----------+------------+---------+-----------+----------+-------+ Subclavian    Full       Yes       Yes                      +----------+------------+---------+-----------+----------+-------+ Axillary      Full       Yes       Yes                      +----------+------------+---------+-----------+----------+-------+ Brachial      Full       Yes       Yes                      +----------+------------+---------+-----------+----------+-------+  Radial        Full                                           +----------+------------+---------+-----------+----------+-------+ Ulnar         Full                                          +----------+------------+---------+-----------+----------+-------+ Cephalic      Full       Yes       Yes                      +----------+------------+---------+-----------+----------+-------+ Basilic       Full       Yes       Yes                      +----------+------------+---------+-----------+----------+-------+  Summary:  Right: No evidence of thrombosis in the subclavian.  Left: No evidence of deep vein thrombosis in the upper extremity. No evidence of superficial vein thrombosis in the upper extremity.  *See table(s) above for measurements and observations.  Diagnosing physician: Lemar Livings MD Electronically signed by Lemar Livings MD on 12/09/2023 at 4:46:58 PM.    Final    DG Abd 1 View Result Date: 12/09/2023 CLINICAL DATA:  Abdominal pain and vomiting. EXAM: ABDOMEN - 1 VIEW COMPARISON:  11/28/2020 FINDINGS: No gaseous bowel dilatation to suggest obstruction. Feeding tube is identified with transpyloric tip placement in the distal descending duodenum. Bones appear diffusely demineralized. IMPRESSION: 1. No gaseous bowel dilatation. 2. Feeding tube tip is in the proximal to mid duodenum. Electronically Signed   By: Kennith Center M.D.   On: 12/09/2023 09:17   [place] EKG SITE RITE Result Date: 12/08/2023 If Site Rite image not attached, placement could not be confirmed due to current cardiac rhythm.  ECHOCARDIOGRAM LIMITED Result Date: 12/08/2023    ECHOCARDIOGRAM LIMITED REPORT   Patient Name:   Jenny White Date of Exam: 12/08/2023 Medical Rec #:  [id number]  Height:       67.0 in Accession #:    [id number] Weight:       105.2 lb Date of Birth:  03/07/1946   BSA:          1.539 m Patient Age:    77 years   BP:           115/39 mmHg Patient Gender: F          HR:           52 bpm. Exam Location:  Inpatient Procedure: Limited Echo, Color Doppler  and Cardiac Doppler (Both Spectral and            Color Flow Doppler were utilized during procedure). Indications:    R94.31 Abnormal EKG  History:        Patient has prior history of Echocardiogram examinations, most                 recent 11/13/2023. COPD, Arrythmias:Atrial Fibrillation; Risk                 Factors:Hypertension.  Sonographer:    Irving Burton Senior RDCS Referring Phys: [id number] Martina Sinner  Sonographer Comments: Technically difficult study, supine on artificial  respirator IMPRESSIONS  1. Left ventricular ejection fraction, by estimation, is 65 to 70%. The left ventricle has normal function. The left ventricle has no regional wall motion abnormalities.  2. Right ventricular systolic function is normal. The right ventricular size is normal. There is moderately elevated pulmonary artery systolic pressure. The estimated right ventricular systolic pressure is 52.4 mmHg.  3. The mitral valve is abnormal. Mild to moderate mitral valve regurgitation. No evidence of mitral stenosis.  4. Aortic valve sclerosis/calcification is present, without any evidence of aortic stenosis.  5. The inferior vena cava is dilated in size with >50% respiratory variability, suggesting right atrial pressure of 8 mmHg. Comparison(s): No significant change from prior study. FINDINGS  Left Ventricle: Left ventricular ejection fraction, by estimation, is 65 to 70%. The left ventricle has normal function. The left ventricle has no regional wall motion abnormalities. The left ventricular internal cavity size was normal in size. Right Ventricle: The right ventricular size is normal. No increase in right ventricular wall thickness. Right ventricular systolic function is normal. There is moderately elevated pulmonary artery systolic pressure. The tricuspid regurgitant velocity is 3.33 m/s, and with an assumed right atrial pressure of 8 mmHg, the estimated right ventricular systolic pressure is 52.4 mmHg. Mitral Valve: The mitral valve is  abnormal. There is mild thickening of the mitral valve leaflet(s). Mild to moderate mitral valve regurgitation. No evidence of mitral valve stenosis. Tricuspid Valve: The tricuspid valve is grossly normal. Tricuspid valve regurgitation is mild. Aortic Valve: Aortic valve sclerosis/calcification is present, without any evidence of aortic stenosis. Pulmonic Valve: The pulmonic valve was grossly normal. Pulmonic valve regurgitation is mild. Venous: The inferior vena cava is dilated in size with greater than 50% respiratory variability, suggesting right atrial pressure of 8 mmHg. Additional Comments: Spectral Doppler performed. Color Doppler performed.  RIGHT VENTRICLE RV S prime:     10.90 cm/s TAPSE (M-mode): 2.3 cm AORTIC VALVE LVOT Vmax:   85.50 cm/s LVOT Vmean:  57.300 cm/s LVOT VTI:    0.160 m TRICUSPID VALVE TR Peak grad:   44.4 mmHg TR Vmax:        333.00 cm/s  SHUNTS Systemic VTI: 0.16 m Rachelle Hora Croitoru MD Electronically signed by Thurmon Fair MD Signature Date/Time: 12/08/2023/4:32:37 PM    Final    DG CHEST PORT 1 VIEW Result Date: 12/08/2023 CLINICAL DATA:  Respiratory distress. EXAM: PORTABLE CHEST 1 VIEW COMPARISON:  Chest radiograph dated 12/03/2023. FINDINGS: Tracheostomy in similar position. Feeding tube extends below the diaphragm with tip beyond the margin of the image. Small bilateral pleural effusions and bibasilar atelectasis or infiltrate. No pneumothorax. Stable cardiac silhouette. No acute osseous pathology. IMPRESSION: Small bilateral pleural effusions and bibasilar atelectasis or infiltrate. Electronically Signed   By: Elgie Collard M.D.   On: 12/08/2023 14:02   DG CHEST PORT 1 VIEW Result Date: 12/03/2023 CLINICAL DATA:  Shortness of breath EXAM: PORTABLE CHEST 1 VIEW COMPARISON:  11/28/2023 FINDINGS: Tracheostomy remains in place, unchanged. Heart and mediastinal contours within normal limits. Aortic atherosclerosis. Small left pleural effusion with left basilar airspace opacity  again noted, slightly improved. No confluent opacity on the right. No acute bony abnormality. IMPRESSION: Continued small left pleural effusion with left lower lobe atelectasis or infiltrate, slightly improved since prior study. Electronically Signed   By: Charlett Nose M.D.   On: 12/03/2023 11:17   DG Abd 1 View Result Date: 11/29/2023 CLINICAL DATA:  Abdominal pain. Status post exploratory laparotomy with bowel resection and ileostomy. EXAM: ABDOMEN - 1  VIEW COMPARISON:  One-view abdomen 11/27/2023. FINDINGS: A small bore feeding tube is stable in position over the distal duodenum. Bowel gas pattern is within normal limits. Contrast in the proximal ascending colon is stable. Postoperative changes of the lumbar spine are noted. IMPRESSION: 1. Stable position of small bore feeding tube over the distal duodenum. 2. No acute abnormality. Electronically Signed   By: Marin Roberts M.D.   On: 11/29/2023 12:48   DG Chest Port 1 View Result Date: 11/28/2023 CLINICAL DATA:  Tracheostomy EXAM: PORTABLE CHEST 1 VIEW COMPARISON:  11/26/2023, 11/24/2023, 11/21/2023 FINDINGS: Interval tracheostomy, tip of the tube is 4.7 cm superior to carina. Right upper extremity central venous catheter tip over the SVC. Enteric tube tip below the diaphragm but incompletely assessed. Irregular left suprahilar opacity with fiducial marker. Small left-sided effusion with persistent dense left lung base airspace disease. Trace right pleural effusion. IMPRESSION: 1. Interval tracheostomy. 2. Persistent small left effusion with dense left lung base airspace disease. Trace right effusion. Aeration does not appear significantly changed compared with 11/26/2023. Electronically Signed   By: Jasmine Pang M.D.   On: 11/28/2023 16:39   DG Abd Portable 1V Result Date: 11/27/2023 CLINICAL DATA:  Abdominal discomfort, recent laparotomy and small-bowel resection EXAM: PORTABLE ABDOMEN - 1 VIEW COMPARISON:  11/22/2023 FINDINGS: 2 supine frontal  views of the abdomen and pelvis are obtained. Enteric catheter passes below diaphragm, tip overlying the region of the distal duodenum. No bowel obstruction or ileus. No masses or abnormal calcifications. Persistent retrocardiac consolidation most consistent with atelectasis. No acute bony abnormalities. IMPRESSION: 1. Enteric catheter tip projecting over distal duodenum. 2. Unremarkable bowel gas pattern.  No obstruction or ileus. 3. Stable retrocardiac consolidation consistent with atelectasis. Electronically Signed   By: Sharlet Salina M.D.   On: 11/27/2023 20:10   DG CHEST PORT 1 VIEW Result Date: 11/26/2023 CLINICAL DATA:  Acute respiratory failure with hypoxia. EXAM: PORTABLE CHEST 1 VIEW COMPARISON:  Chest x-ray dated November 24, 2023. FINDINGS: The patient is rotated to the right. Unchanged endotracheal and feeding tubes. Unchanged right upper extremity PICC line. The heart size and mediastinal contours are within normal limits. Improving atelectasis and small pleural effusion of the right lung base. Relatively unchanged small left pleural effusion and left basilar atelectasis. Fiducial marker again noted in the left upper lobe. No pneumothorax. No acute osseous abnormality. IMPRESSION: 1. Improving right basilar atelectasis and small pleural effusion. 2. Unchanged left basilar atelectasis and small pleural effusion. Electronically Signed   By: Obie Dredge M.D.   On: 11/26/2023 15:06   DG CHEST PORT 1 VIEW Result Date: 11/24/2023 CLINICAL DATA:  Intubation EXAM: PORTABLE CHEST 1 VIEW COMPARISON:  Same-day x-ray FINDINGS: Interval placement of endotracheal tube with distal tip 4.4 cm above the carina. Enteric tube courses below the diaphragm with distal tip beyond the inferior margin of the film. Stable right PICC line terminating at the mid SVC level. Heart size within normal limits. Aortic atherosclerosis. Small bilateral pleural effusions with associated hazy bibasilar opacities, similar to  minimally progressed. No pneumothorax. IMPRESSION: 1. Interval placement of endotracheal tube with distal tip 4.4 cm above the carina. 2. Small bilateral pleural effusions with associated hazy bibasilar opacities, similar to minimally progressed. Electronically Signed   By: Duanne Guess D.O.   On: 11/24/2023 14:55   DG CHEST PORT 1 VIEW Result Date: 11/24/2023 CLINICAL DATA:  Dyspnea. EXAM: PORTABLE CHEST 1 VIEW COMPARISON:  Chest radiographs 11/21/2023 and 11/20/2023. CT 11/11/2023. FINDINGS: 0836 hours. Two views  submitted. Interval extubation. Feeding tube projects below the diaphragm, tip not visualized. There is a right arm PICC with its tip overlying the lower SVC. The heart size and mediastinal contours are stable with aortic atherosclerosis. No significant change in left greater than right pleural effusions and associated bibasilar pulmonary opacities. Left suprahilar fiducial marker noted. No pneumothorax. Numerous telemetry leads overlie the chest. IMPRESSION: Interval extubation. No significant change in left greater than right pleural effusions and associated bibasilar pulmonary opacities. Electronically Signed   By: Carey Bullocks M.D.   On: 11/24/2023 12:36   DG Abd Portable 1V Result Date: 11/22/2023 CLINICAL DATA:  NG tube placement. EXAM: PORTABLE ABDOMEN - 1 VIEW COMPARISON:  11/21/2023. FINDINGS: Enteric tube passes well below the diaphragm, tip projects in the central abdomen, likely within the third portion of the duodenum. This is similar to the previous day's exam allowing for differences in patient positioning. Paucity of bowel gas. No evidence of bowel obstruction. Stable right mid abdomen colostomy. IMPRESSION: Well-positioned enteric tube. Electronically Signed   By: Amie Portland M.D.   On: 11/22/2023 10:22   DG Abd Portable 1V Result Date: 11/21/2023 CLINICAL DATA:  NG tube placement. EXAM: PORTABLE ABDOMEN - 1 VIEW COMPARISON:  None Available. FINDINGS: Weighted enteric  tube in place, the tip is in the right upper quadrant, directed centrally. Tip is likely within the distal stomach. Midline skin staples. IMPRESSION: Weighted enteric tube with tip in the distal stomach. Electronically Signed   By: Narda Rutherford M.D.   On: 11/21/2023 11:17   DG Chest Port 1 View Result Date: 11/21/2023 CLINICAL DATA:  Hypoxia. EXAM: PORTABLE CHEST 1 VIEW COMPARISON:  November 20, 2023. FINDINGS: The heart size and mediastinal contours are within normal limits. Endotracheal tube is in grossly good position. Right-sided PICC line is unchanged. Small left pleural effusion is noted with probable minimal associated subsegmental atelectasis. Minimal right basilar subsegmental atelectasis is also noted with small pleural effusion. The visualized skeletal structures are unremarkable. IMPRESSION: Stable support apparatus. Stable bibasilar subsegmental atelectasis with small pleural effusions, left greater than right. Electronically Signed   By: Lupita Raider M.D.   On: 11/21/2023 10:17   EEG adult Result Date: 11/20/2023 Charlsie Quest, MD     11/20/2023  7:08 PM Patient Name: Jenny White MRN: [id number] Epilepsy Attending: Charlsie Quest Referring Physician/Provider: Duayne Cal, NP Date: 11/20/2023 Duration: 25.53 mins Patient history: 78yo F noted have facial twitching after intubation. EEG to evaluate for seizure Level of alertness: comatose AEDs during EEG study: GBP, versed Technical aspects: This EEG study was done with scalp electrodes positioned according to the 10-20 International system of electrode placement. Electrical activity was reviewed with band pass filter of 1-70Hz , sensitivity of 7 uV/mm, display speed of 40mm/sec with a 60Hz  notched filter applied as appropriate. EEG data were recorded continuously and digitally stored.  Video monitoring was available and reviewed as appropriate. Description: EEG showed continuous generalized 3 to 6 Hz theta-delta slowing admixed with  12-14hz  beta activity. Hyperventilation and photic stimulation were not performed.   ABNORMALITY - Continuous slow, generalized IMPRESSION: This study is suggestive of severe diffuse encephalopathy. No seizures or epileptiform discharges were seen throughout the recording. Charlsie Quest   DG Chest Port 1 View Result Date: 11/20/2023 CLINICAL DATA:  Intubated EXAM: PORTABLE CHEST 1 VIEW COMPARISON:  11/19/2023, 11/15/2023 FINDINGS: Interval intubation, tip of the endotracheal tube is about 3.9 cm superior to the carina. Right upper extremity central venous catheter  tip over the SVC. Normal cardiac size. Hyperinflation with emphysema. Patchy right infrahilar infiltrate. Small bilateral pleural effusions left greater than right. Dense left retrocardiac opacity. Aortic atherosclerosis. Clips in the left suprahilar lung adjacent to vague pulmonary opacity. IMPRESSION: 1. Interval intubation with tip of endotracheal tube about 3.9 cm superior to the carina. Right upper extremity central venous catheter tip over the SVC. 2. Hyperinflation with emphysema. Patchy right infrahilar infiltrate. Dense left retrocardiac opacity and small pleural effusions without significant change Electronically Signed   By: Jasmine Pang M.D.   On: 11/20/2023 17:00   CT HEAD WO CONTRAST ( ) Result Date: 11/20/2023 CLINICAL DATA:  Mental status change with unknown cause EXAM: CT HEAD WITHOUT CONTRAST TECHNIQUE: Contiguous axial images were obtained from the base of the skull through the vertex without intravenous contrast. RADIATION DOSE REDUCTION: This exam was performed according to the departmental dose-optimization program which includes automated exposure control, adjustment of the mA and/or kV according to patient size and/or use of iterative reconstruction technique. COMPARISON:  Brain MRI 06/22/2023 FINDINGS: Brain: No evidence of acute infarction, hemorrhage, hydrocephalus, extra-axial collection or mass lesion/mass effect.  Mild periventricular white matter low-density. Mild for age cerebral volume loss. Vascular: No hyperdense vessel or unexpected calcification. Skull: Normal. Negative for fracture or focal lesion. Sinuses/Orbits: Nasopharyngeal fluid level in the setting of intubation. IMPRESSION: No acute finding.  Stable from 2024 brain MRI. Electronically Signed   By: Tiburcio Pea M.D.   On: 11/20/2023 11:29   DG Chest Port 1 View Result Date: 11/19/2023 CLINICAL DATA:  Pneumonia EXAM: PORTABLE CHEST 1 VIEW COMPARISON:  11/15/2023, 11/14/2023, 11/12/2023, 06/09/2023 FINDINGS: Interval extubation and removal of esophageal tube. Right upper extremity central venous catheter tip over the SVC. Probable trace right effusion. At least small left effusion with persistent dense left lung base opacity. Stable cardiomediastinal silhouette with aortic atherosclerosis. Clip or marker in the left suprahilar lung IMPRESSION: 1. Interval extubation and removal of esophageal tube. 2. Persistent dense left lung base opacity with at least small left effusion. Trace right effusion. Findings appear slightly improved compared with 11/15/2023. Electronically Signed   By: Jasmine Pang M.D.   On: 11/19/2023 16:19   DG Chest Port 1 View Result Date: 11/15/2023 CLINICAL DATA:  ET tube placement EXAM: PORTABLE CHEST 1 VIEW COMPARISON:  X-ray 11/14/2023 FINDINGS: Stable ET tube, enteric tube and right-sided PICC. Hyperinflation. Persistent left retrocardiac opacity. Small pleural effusions. Stable cardiopericardial silhouette calcified aorta. Marker again seen in the medial left upper lobe. No pneumothorax. Overlapping cardiac leads. Apical pleural thickening. IMPRESSION: No significant interval change when adjusting for technique. Electronically Signed   By: Karen Kays M.D.   On: 11/15/2023 10:13   DG Chest Port 1 View Result Date: 11/14/2023 CLINICAL DATA:  Intubation EXAM: PORTABLE CHEST 1 VIEW COMPARISON:  X-ray 11/12/2023 and older  FINDINGS: The extreme lung apices are clipped off the edge of the film. ET tube in place. Enteric tube with tip extending beneath the diaphragm. Right-sided PICC with tip along the central SVC above the right atrium. Hyperinflation. No pneumothorax or edema. Chronic lung changes suggested. Tiny bilateral pleural effusions are seen with some focal left retrocardiac opacity. These are new from previous. Overlapping cardiac leads. Area of nodularity left upper lung with a marker is again noted. Nodular area at the right lung base as well. IMPRESSION: New right-sided PICC. Developing small bilateral pleural effusions and left retrocardiac opacity. Acute process is possible. Recommend follow-up. Electronically Signed   By: Karen Kays  M.D.   On: 11/14/2023 17:59   ECHOCARDIOGRAM COMPLETE Result Date: 11/13/2023    ECHOCARDIOGRAM REPORT   Patient Name:   Jenny White Date of Exam: 11/13/2023 Medical Rec #:  [id number]  Height:       67.0 in Accession #:    [id number] Weight:       130.1 lb Date of Birth:  02/16/1946   BSA:          1.684 m Patient Age:    77 years   BP:           144/65 mmHg Patient Gender: F          HR:           90 bpm. Exam Location:  Inpatient Procedure: 2D Echo, Cardiac Doppler, Color Doppler and Intracardiac            Opacification Agent (Both Spectral and Color Flow Doppler were            utilized during procedure). Indications:   CHF-acute diastolic  History:       Patient has no prior history of Echocardiogram examinations.                Shock.  Sonographer:   Vern Claude Referring      [id number] GUYQIH CHAND Phys:  Sonographer Comments: Image acquisition challenging due to patient body habitus and Image acquisition challenging due to respiratory motion. IMPRESSIONS  1. Poor acoustic windows limit study.  2. Left ventricular ejection fraction, by estimation, is 60 to 65%. The left ventricle has normal function. Left ventricular diastolic parameters are consistent with Grade I diastolic  dysfunction (impaired relaxation).  3. Imaging of RV difficult . Right ventricular systolic function is mildly reduced. The right ventricular size is normal.  4. Trivial mitral valve regurgitation.  5. AV is thickened, calcified with mildly restricted motion.. Aortic valve regurgitation is not visualized. FINDINGS  Left Ventricle: Left ventricular ejection fraction, by estimation, is 60 to 65%. The left ventricle has normal function. The left ventricular internal cavity size was normal in size. There is no left ventricular hypertrophy. Left ventricular diastolic parameters are consistent with Grade I diastolic dysfunction (impaired relaxation). Right Ventricle: Imaging of RV difficult. The right ventricular size is normal. Right vetricular wall thickness was not assessed. Right ventricular systolic function is mildly reduced. Left Atrium: Left atrial size was normal in size. Right Atrium: Right atrial size was normal in size. Pericardium: There is no evidence of pericardial effusion. Mitral Valve: There is mild thickening of the mitral valve leaflet(s). There is mild calcification of the mitral valve leaflet(s). Trivial mitral valve regurgitation. MV peak gradient, 1.5 mmHg. The mean mitral valve gradient is 1.0 mmHg. Tricuspid Valve: The tricuspid valve is normal in structure. Tricuspid valve regurgitation is not demonstrated. Aortic Valve: AV is thickened, calcified with mildly restricted motion. Aortic valve regurgitation is not visualized. Aortic valve mean gradient measures 1.0 mmHg. Aortic valve peak gradient measures 2.4 mmHg. Aortic valve area, by VTI measures 2.47 cm. Pulmonic Valve: The pulmonic valve was not well visualized. Pulmonic valve regurgitation is not visualized. Aorta: The aortic root is normal in size and structure. IAS/Shunts: No atrial level shunt detected by color flow Doppler.  LEFT VENTRICLE PLAX 2D LVIDd:         3.70 cm     Diastology LVIDs:         2.50 cm     LV e' medial:    6.31  cm/s LV  PW:         0.70 cm     LV E/e' medial:  5.6 LV IVS:        0.70 cm     LV e' lateral:   9.03 cm/s LVOT diam:     2.00 cm     LV E/e' lateral: 3.9 LV SV:         29 LV SV Index:   17 LVOT Area:     3.14 cm  LV Volumes (MOD) LV vol d, MOD A4C: 60.4 ml LV vol s, MOD A4C: 18.6 ml LV SV MOD A4C:     60.4 ml RIGHT VENTRICLE RV Basal diam:  2.80 cm RV Mid diam:    2.30 cm RV S prime:     12.70 cm/s TAPSE (M-mode): 3.1 cm LEFT ATRIUM           Index       RIGHT ATRIUM          Index LA diam:      2.50 cm 1.48 cm/m  RA Area:     6.75 cm LA Vol (A2C): 16.5 ml 9.80 ml/m  RA Volume:   11.79 ml 7.00 ml/m LA Vol (A4C): 16.8 ml 9.97 ml/m  AORTIC VALVE                    PULMONIC VALVE AV Area (Vmax):    2.71 cm     PV Vmax:       0.66 m/s AV Area (Vmean):   2.27 cm     PV Peak grad:  1.7 mmHg AV Area (VTI):     2.47 cm AV Vmax:           78.00 cm/s AV Vmean:          53.950 cm/s AV VTI:            0.116 m AV Peak Grad:      2.4 mmHg AV Mean Grad:      1.0 mmHg LVOT Vmax:         67.20 cm/s LVOT Vmean:        39.050 cm/s LVOT VTI:          0.091 m LVOT/AV VTI ratio: 0.78  AORTA Ao Root diam: 3.20 cm Ao Asc diam:  3.10 cm MITRAL VALVE MV Area (PHT): 2.19 cm    SHUNTS MV Area VTI:   1.91 cm    Systemic VTI:  0.09 m MV Peak grad:  1.5 mmHg    Systemic Diam: 2.00 cm MV Mean grad:  1.0 mmHg MV Vmax:       0.61 m/s MV Vmean:      42.7 cm/s MV Decel Time: 346 msec MV E velocity: 35.30 cm/s MV A velocity: 58.60 cm/s MV E/A ratio:  0.60 Dietrich Pates MD Electronically signed by Dietrich Pates MD Signature Date/Time: 11/13/2023/2:11:14 PM    Final    DG Chest Port 1 View Result Date: 11/12/2023 CLINICAL DATA:  Endotracheal tube. EXAM: PORTABLE CHEST 1 VIEW COMPARISON:  November 11, 2023. FINDINGS: The heart size and mediastinal contours are within normal limits. Endotracheal and nasogastric tubes are unchanged in position. Left lung is clear. Minimal right basilar subsegmental atelectasis or scarring is noted. The visualized  skeletal structures are unremarkable. IMPRESSION: Stable support apparatus. Minimal right basilar subsegmental atelectasis or scarring. Electronically Signed   By: Lupita Raider M.D.   On: 11/12/2023 09:48   [place] EKG SITE RITE Result  Date: 11/12/2023 If Site Rite image not attached, placement could not be confirmed due to current cardiac rhythm.  DG CHEST PORT 1 VIEW Result Date: 11/11/2023 CLINICAL DATA:  Intubated EXAM: PORTABLE CHEST 1 VIEW COMPARISON:  06/09/2023, CT 11/11/2023, PET CT 09/08/2023 FINDINGS: Endotracheal tube tip is about 4.4 cm superior to the carina. Enteric tube tip below the diaphragm but incompletely visualized. Emphysema. Vague density with fiducial marker in the left upper lobe. Known peripheral right lung base mass is evident as vague right basilar opacity. Normal cardiac size with aortic atherosclerosis. IMPRESSION: 1. Endotracheal tube tip about 4.4 cm superior to carina 2. Emphysema. Vague right lung base opacity corresponding to known mass/consolidation in the region. Similar vague left upper lobe opacity with fiducial marker Electronically Signed   By: Jasmine Pang M.D.   On: 11/11/2023 19:36   DG Abdomen 1 View Result Date: 11/11/2023 CLINICAL DATA:  Status post nasogastric tube placement. Emesis and nausea. Non-small-cell lung cancer. 11/11/2023 EXAM: ABDOMEN - 1 VIEW COMPARISON:  CT chest, abdomen, and pelvis 11/11/2023 FINDINGS: New enteric tube descends below the diaphragm and curls along the greater curvature of the stomach with the tip overlying the right upper quadrant, likely within the distal stomach. Right upper quadrant cholecystectomy clips. Contrast excretion by the bilateral kidneys from IV contrast administered for CT 1.5 hours earlier. Fluid-filled bowel loops as on prior CT. The dilated loops of bowel and CT are likely grossly similar to prior, with left lower quadrant bowel loop measuring up to 3.9 cm in caliber. Kyphoplasty cement is again seen within the L2  vertebral body. L3-4 bilateral transpedicular rod and screw fusion hardware with associated intervertebral disc spacer. IMPRESSION: 1. New enteric tube tip overlies the right upper quadrant, likely within the distal stomach. 2. Dilated fluid-filled loops of bowel as on prior CT. Electronically Signed   By: Neita Garnet M.D.   On: 11/11/2023 15:58   CT Angio Chest/Abd/Pel for Dissection W and/or W/WO Result Date: 11/11/2023 CLINICAL DATA:  Non-small-cell lung cancer. Abdominal pain nausea vomiting. * Tracking Code: BO * EXAM: CT ANGIOGRAPHY CHEST, ABDOMEN AND PELVIS TECHNIQUE: Non-contrast CT of the chest was initially obtained. Multidetector CT imaging through the chest, abdomen and pelvis was performed using the standard protocol during bolus administration of intravenous contrast. Multiplanar reconstructed images and MIPs were obtained and reviewed to evaluate the vascular anatomy. RADIATION DOSE REDUCTION: This exam was performed according to the departmental dose-optimization program which includes automated exposure control, adjustment of the mA and/or kV according to patient size and/or use of iterative reconstruction technique. CONTRAST:  OMNIPAQUE IOHEXOL 350 MG/ML SOLN COMPARISON:  Chest CT with contrast 10/15/2023. PET-CT 09/08/2023. Older exams as well. FINDINGS: CTA CHEST FINDINGS Cardiovascular: Aortic root has a diameter of 3.2 cm. The ascending aorta at the level of the main pulmonary artery has a diameter of 3.7 x 3.4 cm. The descending thoracic aorta same level measures 2.6 x 2.5 cm. The distal aortic arch has a diameter approaching 3.0 cm. There is mild partially calcified atherosclerotic plaque. Some of the plaque along the descending thoracic aorta is irregular. Slight plaque also along the origin of the great vessels. No abnormal high density along the course of the thoracic aorta on the noncontrast dataset. No dissection or aneurysm formation. Coronary artery calcifications are seen.  Tiny pericardial effusion. Heart is nonenlarged. Mediastinum/Nodes: Patulous fluid-filled esophagus. Slight wall thickening of the distal esophagus. There is an enlarged heterogeneous thyroid gland. This was hypermetabolic on PET-CT scan. Please correlate  with known history. No specific abnormal lymph node enlargement identified in the axillary regions, hilum or mediastinum. Lungs/Pleura: Emphysematous lung changes identified. Bilateral apical pleural thickening. There is some left upper lobe anterior scarring and bronchiectasis, unchanged from previous. Areas of bronchial wall thickening identified along lower lungs. No pneumothorax, effusion or edema. Areas of mucous plugging along the left lower lobe inferolateral is again noted as well. The focal ill-defined opacity along the dependent right lower lobe is again seen. Previously this was measured at 3.5 x 3.5 cm and today when measured in similar fashion measures 2.5 x 4.3 cm. Overall similar when adjusted for variances in technique. No new dominant lung lesion. Some tiny nodules identified as well such as left lower lobe anteriorly on series 7, image 117 which is unchanged measuring 4 mm. The central nodule left suprahilar with tissue marker is stable. Previous dimension of 7 mm and today on series 7, image 34 7 mm. Musculoskeletal: Osteopenia. Scattered degenerative changes. There are multiple compression deformities along the spine which are new including T6 and T12. The T12 level has some sclerosis. Please correlate with the time course of injury. Review of the MIP images confirms the above findings. CTA ABDOMEN AND PELVIS FINDINGS VASCULAR Aorta: Partially calcified atherosclerotic plaque. No dissection or aneurysm formation. Maximal dimension of the inferior abdominal aorta of 2.2 cm. Overall there is moderate calcified plaque. Celiac: Mild disease along the origin. There is also calcified plaque along a atretic splenic artery. 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  SMA: Moderate atherosclerotic disease along the origin of the SMA with mild stenosis. More calcified plaque more distally along the SMA. Renals: Single bowel main renal arteries with some mild calcified plaque. IMA: Grossly patent. Inflow: There is calcified moderate disease diffusely along the iliac vessels with only mild areas of stenosis suggested along the common and external iliacs. More moderate along the internal. Of note the common femoral and visualized femoral vessels have more significant disease. Veins: No obvious venous abnormality within the limitations of this arterial phase study. Review of the MIP images confirms the above findings. NON-VASCULAR Hepatobiliary: With the limits of the early phase of the arterial bolus, grossly the liver is preserved. Previous cholecystectomy with a dilated common duct but unchanged from prior. Pancreas: Unremarkable. No pancreatic ductal dilatation or surrounding inflammatory changes. 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 Please correlate with history. Adrenals/Urinary Tract: Stable thickening of the adrenal glands. Large upper pole left-sided Bosniak 1 renal cyst measuring 7 cm. A few smaller foci identified as well in each kidney, too small to characterize and poorly seen on this arterial phase only examination. No collecting system dilatation. The ureters have normal course and caliber extending down to the urinary bladder. The bladder is underdistended. Stomach/Bowel: Dilated fluid-filled stomach and small bowel diffusely with caliber change seen of the distal small bowel such as axial image 217 of series 6,. Please see coronal series 9, image 79 in the right lower quadrant. No pneumatosis, free air. Trace free fluid. The colon is relatively decompressed with scattered stool. There are some loops of small bowel extending anterior to the left side of the transverse colon. Evaluation of the bowel wall enhancement is somewhat more difficult as this is a true  early arterial phase examination. Please correlate for any clinical evidence of ischemia. Again no pneumatosis. Lymphatic: No specific abnormal lymph node enlargement identified in the abdomen and pelvis. Reproductive: Uterus and bilateral adnexa are unremarkable. Other: No free intra-air. Scattered mild free fluid. Slight mesenteric  stranding. Mild mesenteric edema. Musculoskeletal: Degenerative changes seen of the spine and pelvis. Augmentation cement seen at L2. Fixation hardware posteriorly at L3-4. Associated streak artifact. Multilevel degenerative changes and stenosis. Critical Value/emergent results were called by telephone at the time of interpretation on 11/11/2023 at 12:40 pm to provider Alvira Monday, who verbally acknowledged these results. Review of the MIP images confirms the above findings. IMPRESSION: Scattered calcified as sclerotic plaque. Some areas of plaque are irregular. No dissection or aneurysm formation. There are areas of significant stenosis suggested particularly along the iliac vessels and femoral vessels at the edge of the imaging field. High-grade small-bowel obstruction is with transition point in the right lower quadrant along the distal ileum. There is some trace ascites and some mesenteric stranding. Evaluation the bowel wall in some areas is difficult due to the early phase of the arterial bolus. No pneumatosis. A component of bowel wall ischemia is difficult to completely exclude with this examination. Please correlate with clinical presentation. If needed short follow up could be considered. Distended esophagus with luminal fluid. This could relate to the bowel obstruction. Masslike parenchymal opacity again seen in the right lower lobe. Stable other small areas of lung nodularity as well as the focal nodule left upper lobe with a fiduciary marker distant with known history of neoplasm. No developing new lymph node enlargement seen in the chest, abdomen and pelvis. New  compression deformities along the thoracic spine at T12 and T6 compared to the study of 10/15/2023. Please correlate with any specific history and known injury. Additional workup as clinically appropriate to assess for etiology and exclude a marrow replacement process. Thyroid goiter. Emphysematous lung changes. Electronically Signed   By: Karen Kays M.D.   On: 11/11/2023 12:49   CT Chest W Contrast Result Date: 10/20/2023 CLINICAL DATA:  Non-small cell lung cancer, nonmetastatic, assess treatment response. * Tracking Code: BO * EXAM: CT CHEST WITH CONTRAST TECHNIQUE: Multidetector CT imaging of the chest was performed during intravenous contrast administration. RADIATION DOSE REDUCTION: This exam was performed according to the departmental dose-optimization program which includes automated exposure control, adjustment of the mA and/or kV according to patient size and/or use of iterative reconstruction technique. CONTRAST:  75mL OMNIPAQUE IOHEXOL 300 MG/ML  SOLN COMPARISON:  PET 09/08/2023 and CT chest 03/31/2023. FINDINGS: Cardiovascular: Atherosclerotic calcification of the aorta, aortic valve and coronary arteries. Heart is at the upper limits of normal in size. Small pericardial effusion. Enlarged pulmonic trunk. Mediastinum/Nodes: Enlarged thyroid, left greater than right, with small low-attenuation nodules measuring up to 9 mm on the right. No pathologically enlarged mediastinal lymph nodes. Bihilar lymph nodes are not enlarged by CT size criteria. No axillary adenopathy. Esophagus is grossly unremarkable. Lungs/Pleura: Biapical pleuroparenchymal scarring. Centrilobular and paraseptal emphysema. Additional scattered pulmonary parenchymal scarring. Masslike consolidation in the posterior right lower lobe measures approximately 3.5 x 3.5 cm (5/118), decreased in size from 4.3 x 5.7 cm on 03/31/2023. Peribronchovascular consolidation in the inferior right middle lobe (5/113), new. New patchy ground-glass in  the anterior segment left upper lobe (5/46). Fiducial marker in the perihilar left upper lobe, adjacent to a 7 mm nodule, previously a slightly thick-walled atypical pulmonary cyst on 03/31/2023. 4 mm anterior left lower lobe nodule (5/122), stable. No pleural fluid. Minimal debris in the airway. Upper Abdomen: Cholecystectomy. Low-attenuation mass in the left kidney. No specific follow-up necessary. Left renal vascular calcifications. Spleen is absent. Visualized portions of the liver, adrenal glands, kidneys, pancreas, stomach and bowel are otherwise grossly unremarkable. No  upper abdominal adenopathy. Musculoskeletal: Osteopenia. Degenerative changes in the spine. L2 vertebral body augmentation. Partially imaged posterior lumbar interbody fusion involving L3. No worrisome lytic or sclerotic lesions. IMPRESSION: 1. Interval decrease in size of masslike consolidation in the posterior right lower lobe, indicative of treatment response. New peribronchovascular consolidation in the inferior right middle lobe, possibly treatment related. 2. 7 mm perihilar left upper lobe nodule, previously seen as a minimally thick-walled atypical pulmonary cyst, overall decreased in size. Adjacent fiducial marker. Patchy ground-glass in the anterior segment left upper lobe may be treatment related. Recommend attention on follow-up. 3. Small pericardial effusion. 4. Enlarged nodular thyroid. Previous ultrasound 06/23/2023. No follow-up recommended unless clinically warranted. (Ref: J Am Coll Radiol. 2015 Feb;12(2): 143-50). 5. Aortic atherosclerosis (ICD10-I70.0). Coronary artery calcification. 6. Enlarged pulmonic trunk, indicative of pulmonary arterial hypertension. 7.  Emphysema (ICD10-J43.9). Electronically Signed   By: Leanna Battles M.D.   On: 10/20/2023 13:28    Discharge Exam: BP (!) 103/41   Pulse 74   Temp (!) 97.5 F (36.4 C) (Oral)   Resp (!) 24   Ht 5\' 7"  (1.702 m)   Wt 47.8 kg   SpO2 100%   BMI 16.50 kg/m    General: Chronically ill appearing, mildly anxious, in NAD Lungs: Trach in place without erythema or tenderness, with full ventilatory support. Good air movements bilaterally anteriorly Abdomen: No abdominal or suprapubic tenderness. Midline incision with right abdominal ostomy, stool in pouch.  Extremities: Warm and dry. Trace edema bilaterally Neuro: Awake, interactive, following directions. Mouthing words.  GU: Foley catheter  Labs at discharge   Lab Results  Component Value Date   CREATININE 0.65 12/11/2023   BUN 65 (H) 12/11/2023   NA 139 12/11/2023   K 5.1 12/11/2023   CL 109 12/11/2023   CO2 23 12/11/2023   Lab Results  Component Value Date   WBC 10.9 (H) 12/11/2023   HGB 7.0 (L) 12/11/2023   HCT 21.9 (L) 12/11/2023   MCV 95.2 12/11/2023   PLT 331 12/11/2023   Lab Results  Component Value Date   ALT 86 (H) 11/25/2023   AST 28 11/25/2023   ALKPHOS 103 11/25/2023   BILITOT 0.6 11/25/2023   Lab Results  Component Value Date   INR 1.0 11/12/2023   INR 0.9 06/16/2020    Current radiological studies    VAS [place] UPPER EXTREMITY VENOUS DUPLEX Result Date: 12/09/2023 UPPER VENOUS STUDY  Patient Name:  Jenny White  Date of Exam:   12/09/2023 Medical Rec #: [id number]   Accession #:    [id number] Date of Birth: Dec 15, 1945    Patient Gender: F Patient Age:   89 years Exam Location:  [facility] Procedure:      VAS [place] UPPER EXTREMITY VENOUS DUPLEX Referring Phys: Melody Comas --------------------------------------------------------------------------------  Indications: Rule out VTE, Edema, and Post-op Other Indications: Lung cancer. Comparison Study: No prior exam. Performing Technologist: Fernande Bras  Examination Guidelines: A complete evaluation includes B-mode imaging, spectral Doppler, color Doppler, and power Doppler as needed of all accessible portions of each vessel. Bilateral testing is considered an integral part of a complete examination. Limited  examinations for reoccurring indications may be performed as noted.  Right Findings: +----------+------------+---------+-----------+----------+-------+ RIGHT     CompressiblePhasicitySpontaneousPropertiesSummary +----------+------------+---------+-----------+----------+-------+ Subclavian    Full       Yes       Yes                      +----------+------------+---------+-----------+----------+-------+  Left Findings: +----------+------------+---------+-----------+----------+-------+ LEFT      CompressiblePhasicitySpontaneousPropertiesSummary +----------+------------+---------+-----------+----------+-------+ IJV           Full       Yes       Yes                      +----------+------------+---------+-----------+----------+-------+ Subclavian    Full       Yes       Yes                      +----------+------------+---------+-----------+----------+-------+ Axillary      Full       Yes       Yes                      +----------+------------+---------+-----------+----------+-------+ Brachial      Full       Yes       Yes                      +----------+------------+---------+-----------+----------+-------+ Radial        Full                                          +----------+------------+---------+-----------+----------+-------+ Ulnar         Full                                          +----------+------------+---------+-----------+----------+-------+ Cephalic      Full       Yes       Yes                      +----------+------------+---------+-----------+----------+-------+ Basilic       Full       Yes       Yes                      +----------+------------+---------+-----------+----------+-------+  Summary:  Right: No evidence of thrombosis in the subclavian.  Left: No evidence of deep vein thrombosis in the upper extremity. No evidence of superficial vein thrombosis in the upper extremity.  *See table(s) above for measurements and  observations.  Diagnosing physician: Lemar Livings MD Electronically signed by Lemar Livings MD on 12/09/2023 at 4:46:58 PM.    Final     Disposition:    Discharge disposition: [facility]-Long Term Care        Discharge Instructions     Call MD for:  redness, tenderness, or signs of infection (pain, swelling, redness, odor or green/yellow discharge around incision site)   Complete by: As directed    Call MD for:  severe uncontrolled pain   Complete by: As directed    Call MD for:  temperature >100.4   Complete by: As directed    Diet - low sodium heart healthy   Complete by: As directed    Discharge wound care:   Complete by: As directed    Pressure Injury 11/25/23 Coccyx Left;Medial Deep Tissue Pressure Injury - Purple or maroon localized area of discolored intact skin or blood-filled blister due to damage of underlying soft tissue from pressure and/or shear. Purple 14 days  Pressure Injury 12/02/23 Heel Left Stage 2 -  Partial thickness  loss of dermis presenting as a shallow open injury with a red, pink wound bed without slough. 7 days    Wound care  Daily      Comments: Cleanse sacrum/buttocks/coccyx with Vashe wound cleanser Hart [place] [phone])  Apply Medihoney daily on the wound bed. Cover with foam dressing, change every 3 days or PRN.  Pressure Injury 12/02/23 Heel Right Stage 2 -  Partial thickness loss of dermis presenting as a shallow open injury with a red, pink wound bed without slough.   Discharge wound care:   Complete by: As directed    Pressure Injury 11/25/23 Coccyx Left;Medial Deep Tissue Pressure Injury - Purple or maroon localized area of discolored intact skin or blood-filled blister due to damage of underlying soft tissue from pressure and/or shear. Purple 15 days    Pressure Injury 12/02/23 Heel Left Stage 2 -  Partial thickness loss of dermis presenting as a shallow open injury with a red, pink wound bed without slough. 8 days   Pressure Injury 12/02/23 Heel Right Stage  2 -  Partial thickness loss of dermis presenting as a shallow open injury with a red, pink wound bed without slough. 8 days   Increase activity slowly   Complete by: As directed        Allergies as of 12/11/2023   No Known Allergies      Medication List     STOP taking these medications    albuterol 108 (90 Base) MCG/ACT inhaler Commonly known as: VENTOLIN HFA   aspirin EC 81 MG tablet   benzonatate 100 MG capsule Commonly known as: TESSALON   Breztri Aerosphere 160-9-4.8 MCG/ACT Aero Generic drug: budeson-glycopyrrolate-formoterol   chlorpheniramine-HYDROcodone 10-8 MG/5ML Commonly known as: TUSSIONEX   cyclobenzaprine 5 MG tablet Commonly known as: FLEXERIL   dexamethasone 4 MG tablet Commonly known as: DECADRON   docusate sodium 100 MG capsule Commonly known as: COLACE   famotidine 40 MG tablet Commonly known as: PEPCID   gabapentin 100 MG capsule Commonly known as: NEURONTIN Replaced by: gabapentin 250 MG/5ML solution   HYDROcodone-acetaminophen 5-325 MG tablet Commonly known as: NORCO/VICODIN   ibuprofen 200 MG tablet Commonly known as: ADVIL   ipratropium-albuterol 0.5-2.5 (3) MG/3ML Soln Commonly known as: DUONEB   lisinopril-hydrochlorothiazide 10-12.5 MG tablet Commonly known as: ZESTORETIC   loratadine 10 MG tablet Commonly known as: CLARITIN   LORazepam 0.5 MG tablet Commonly known as: Ativan   nicotine polacrilex 2 MG gum Commonly known as: NICORETTE   nystatin 100000 UNIT/ML suspension Commonly known as: MYCOSTATIN   OHTUVAYRE IN   pantoprazole 20 MG tablet Commonly known as: PROTONIX Replaced by: pantoprazole 40 MG injection   Vitamin D 50 MCG (2000 UT) tablet       TAKE these medications    acetaminophen 500 MG tablet Commonly known as: TYLENOL Take 2 tablets (1,000 mg total) by mouth every 8 (eight) hours as needed for mild pain (pain score 1-3).   alum & mag hydroxide-simeth 200-200-20 MG/5ML suspension Commonly  known as: MAALOX/MYLANTA Place 15 mLs into feeding tube every 4 (four) hours as needed for indigestion or heartburn.   apixaban 5 MG Tabs tablet Commonly known as: ELIQUIS Place 1 tablet (5 mg total) into feeding tube 2 (two) times daily.   arformoterol 15 MCG/2ML Nebu Commonly known as: BROVANA Take 2 mLs (15 mcg total) by nebulization 2 (two) times daily.   budesonide 0.5 MG/2ML nebulizer solution Commonly known as: PULMICORT Take 2 mLs (0.5 mg total) by nebulization 2 (two)  times daily.   busPIRone 10 MG tablet Commonly known as: BUSPAR Place 1 tablet (10 mg total) into feeding tube 2 (two) times daily. What changed:  medication strength how much to take how to take this   Chlorhexidine Gluconate Cloth 2 % Pads Apply 6 each topically daily.   diclofenac Sodium 1 % Gel Commonly known as: VOLTAREN Apply 4 g topically 4 (four) times daily.   diphenoxylate-atropine 2.5-0.025 MG tablet Commonly known as: LOMOTIL Place 2 tablets into feeding tube 4 (four) times daily.   feeding supplement (PROSource TF20) liquid Place 60 mLs into feeding tube daily.   feeding supplement (VITAL 1.5 CAL) Liqd Place 1,000 mLs into feeding tube continuous.   nutrition supplement (JUVEN) Pack Place 1 packet into feeding tube 2 (two) times daily between meals.   ferrous sulfate 300 (60 Fe) MG/5ML syrup Place 5 mLs (300 mg total) into feeding tube daily.   fiber supplement (BANATROL TF) liquid Place 60 mLs into feeding tube 3 (three) times daily.   gabapentin 250 MG/5ML solution Commonly known as: NEURONTIN Place 2 mLs (100 mg total) into feeding tube every 12 (twelve) hours. Replaces: gabapentin 100 MG capsule   guaiFENesin 100 MG/5ML liquid Commonly known as: ROBITUSSIN Place 5 mLs into feeding tube every 6 (six) hours as needed for cough or to loosen phlegm.   leptospermum manuka honey Pste paste Apply 1 Application topically daily.   levalbuterol 0.63 MG/3ML nebulizer  solution Commonly known as: XOPENEX Take 3 mLs (0.63 mg total) by nebulization every 3 (three) hours as needed for wheezing or shortness of breath.   loperamide HCl 1 MG/7.5ML suspension Commonly known as: IMODIUM Place 30 mLs (4 mg total) into feeding tube 4 (four) times daily.   melatonin 5 MG Tabs Place 1 tablet (5 mg total) into feeding tube at bedtime as needed.   meropenem 1 g in sodium chloride 0.9 % 100 mL Inject 1 g into the vein every 12 (twelve) hours for 5 days.   midodrine 5 MG tablet Commonly known as: PROAMATINE Place 1 tablet (5 mg total) into feeding tube 3 (three) times daily with meals.   montelukast 10 MG tablet Commonly known as: SINGULAIR Place 1 tablet (10 mg total) into feeding tube at bedtime. What changed: how to take this   mouth rinse Liqd solution 15 mLs by Mouth Rinse route every 2 (two) hours.   nicotine 21 mg/24hr patch Commonly known as: NICODERM CQ - dosed in mg/24 hours Place 1 patch (21 mg total) onto the skin daily.   pantoprazole 40 MG injection Commonly known as: PROTONIX Inject 40 mg into the vein daily. Replaces: pantoprazole 20 MG tablet   revefenacin 175 MCG/3ML nebulizer solution Commonly known as: YUPELRI Take 3 mLs (175 mcg total) by nebulization daily.   scopolamine 1 MG/3DAYS Commonly known as: TRANSDERM-SCOP Place 1 patch (1.5 mg total) onto the skin every 3 (three) days. Start taking on: December 12, 2023   simethicone 40 MG/0.6ML drops Commonly known as: MYLICON Place 1.2 mLs (80 mg total) into feeding tube 4 (four) times daily as needed for flatulence (gas pains).   sodium chloride flush 0.9 % Soln Commonly known as: NS 10-40 mLs by Intracatheter route every 12 (twelve) hours.   sodium chloride flush 0.9 % Soln Commonly known as: NS 10-40 mLs by Intracatheter route as needed (flush).                 Follow-up appointment    Discharge Condition:  fair   Signed: Morene Crocker 12/11/2023,  10:52 AM  needed for flatulence (gas pains).   sodium chloride flush 0.9 % Soln Commonly known as: NS 10-40 mLs by Intracatheter route every 12 (twelve) hours.   sodium chloride flush 0.9 % Soln Commonly known as: NS 10-40 mLs by Intracatheter route as needed (flush).               Discharge Care Instructions  (From admission, onward)           Start     Ordered   12/10/23 0000  Discharge wound care:       Comments: Pressure Injury 11/25/23 Coccyx  Left;Medial Deep Tissue Pressure Injury - Purple or maroon localized area of discolored intact skin or blood-filled blister due to damage of underlying soft tissue from pressure and/or shear. Purple 14 days  Pressure Injury 12/02/23 Heel Left Stage 2 -  Partial thickness loss of dermis presenting as a shallow open injury with a red, pink wound bed without slough. 7 days    Wound care  Daily      Comments: Cleanse sacrum/buttocks/coccyx with Vashe wound cleanser Hart [place] [phone])  Apply Medihoney daily on the wound bed. Cover with foam dressing, change every 3 days or PRN.  Pressure Injury 12/02/23 Heel Right Stage 2 -  Partial thickness loss of dermis presenting as a shallow open injury with a red, pink wound bed without slough.   12/10/23 1433             Follow-up appointment    Discharge Condition:    fair   Signed: Morene Crocker 12/10/2023, 2:33 PM

## 2023-12-11 ENCOUNTER — Inpatient Hospital Stay (HOSPITAL_COMMUNITY)

## 2023-12-11 ENCOUNTER — Inpatient Hospital Stay
Admission: AD | Admit: 2023-12-11 | Discharge: 2024-03-09 | Disposition: E | Source: Other Acute Inpatient Hospital | Attending: Internal Medicine | Admitting: Internal Medicine

## 2023-12-11 ENCOUNTER — Other Ambulatory Visit (HOSPITAL_COMMUNITY)

## 2023-12-11 LAB — RENAL FUNCTION PANEL
Albumin: 3.4 g/dL — ABNORMAL LOW (ref 3.5–5.0)
Anion gap: 7 (ref 5–15)
BUN: 65 mg/dL — ABNORMAL HIGH (ref 8–23)
CO2: 23 mmol/L (ref 22–32)
Calcium: 10.1 mg/dL (ref 8.9–10.3)
Chloride: 109 mmol/L (ref 98–111)
Creatinine, Ser: 0.65 mg/dL (ref 0.44–1.00)
GFR, Estimated: >60 mL/min (ref >60)
Glucose, Bld: 127 mg/dL — ABNORMAL HIGH (ref 70–99)
Phosphorus: 2.4 mg/dL — ABNORMAL LOW (ref 2.5–4.6)
Potassium: 5.1 mmol/L (ref 3.5–5.1)
Sodium: 139 mmol/L (ref 135–145)

## 2023-12-11 LAB — CBC
HCT: 21.9 % — ABNORMAL LOW (ref 36.0–46.0)
Hemoglobin: 7 g/dL — ABNORMAL LOW (ref 12.0–15.0)
MCH: 30.4 pg (ref 26.0–34.0)
MCHC: 32 g/dL (ref 30.0–36.0)
MCV: 95.2 fL (ref 80.0–100.0)
Platelets: 331 10*3/uL (ref 150–400)
RBC: 2.3 MIL/uL — ABNORMAL LOW (ref 3.87–5.11)
RDW: 16.3 % — ABNORMAL HIGH (ref 11.5–15.5)
WBC: 10.9 10*3/uL — ABNORMAL HIGH (ref 4.0–10.5)
nRBC: 1.7 % — ABNORMAL HIGH (ref 0.0–0.2)

## 2023-12-11 LAB — BLOOD GAS, ARTERIAL
Acid-Base Excess: 3.4 mmol/L — ABNORMAL HIGH (ref 0.0–2.0)
Bicarbonate: 29.7 mmol/L — ABNORMAL HIGH (ref 20.0–28.0)
O2 Saturation: 98.4 %
Patient temperature: 36.1
pCO2 arterial: 53 mmHg — ABNORMAL HIGH (ref 32–48)
pH, Arterial: 7.35 (ref 7.35–7.45)
pO2, Arterial: 67 mmHg — ABNORMAL LOW (ref 83–108)

## 2023-12-11 LAB — MAGNESIUM: Magnesium: 2.3 mg/dL (ref 1.7–2.4)

## 2023-12-11 LAB — CULTURE, RESPIRATORY W GRAM STAIN: Gram Stain: NONE SEEN

## 2023-12-11 LAB — GLUCOSE, CAPILLARY
Glucose-Capillary: 114 mg/dL — ABNORMAL HIGH (ref 70–99)
Glucose-Capillary: 130 mg/dL — ABNORMAL HIGH (ref 70–99)

## 2023-12-11 MED ORDER — SODIUM PHOSPHATES 45 MMOLE/15ML IV SOLN
15.0000 mmol | Freq: Once | INTRAVENOUS | Status: DC
  Administered 2023-12-11: 15 mmol via INTRAVENOUS
  Filled 2023-12-11: qty 5

## 2023-12-11 MED ORDER — CHLORHEXIDINE GLUCONATE CLOTH 2 % EX PADS
6.0000 | MEDICATED_PAD | Freq: Every day | CUTANEOUS | Status: DC
  Administered 2023-12-11: 6 via TOPICAL

## 2023-12-11 NOTE — Progress Notes (Signed)
 Physical Therapy Treatment Patient Details Name: Jenny White MRN: [id number] DOB: 02/26/1946 Today's Date: 12/11/2023   History of Present Illness 78 yo female presenting with n/v on 3/4. CT(+) SBO. 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 Trach placed 3/21. 3/25 Transfer to ICU and placed on Vent. PMH COPD, GERD, nephrolithiasis, HTN, OSA, lung CA (05/2023) s/p radiation, arthritis, spinal stenosis L3-4 fusion.    PT Comments  Pt received in supine, agreeable to therapy session with encouragement, with participation in supine and seated chair egress posture BLE exercises. Pt not agreeable to attempt sit<>stand transfers this date due to c/o pain in RLQ/ostomy site and anxiety, SBP stable with positional changes with soft DBP throughout. Pt tolerates 90 degree seated posture ~10 mins (mostly resting against back rest) prior to requesting return to supine due to pain and HR raising to higher 90 bpm. Emphasis on benefits of frequent repositioning and gradually increased activity/positioning for BLE and UE edema mgmt. Pt with pitting edema 2+ to 3+ on LLE distal to knee including dorsal foot and 1+ to 2+ on RLE. Pt continues to benefit from PT services to progress toward functional mobility goals.  SpO2 100% on trach vent PRVC mode; FiO2 40%; PEEP 5 RR 24 rpm resting, to 28 rpm with seated activity HR 87 bpm resting to 95 bpm with seated activity  Orthostatic Lying   BP- Lying 115/44  Pulse- Lying 87  Orthostatic Sitting  BP- Sitting (90/90 degree bed chair posture) 113/45  Pulse- Sitting 89   Orthostatic Lying   BP- Lying 104/54 (After return to supine; HOB >30*, pt c/o increased pain in abdomen)  Pulse- Lying 90-95     If plan is discharge home, recommend the following: Two people to help with walking and/or transfers;Two people to help with bathing/dressing/bathroom   Can travel by private vehicle     No  Equipment  Recommendations  BSC/3in1;Wheelchair cushion (measurements PT);Wheelchair (measurements PT);Hoyer lift;Hospital bed    Recommendations for Other Services       Precautions / Restrictions Precautions Precautions: Fall;Other (comment) Recall of Precautions/Restrictions: Impaired Precaution/Restrictions Comments: ostomy, trach, midline abd incision, cortrak, watch sats and BP Restrictions Weight Bearing Restrictions Per Provider Order: No     Mobility  Bed Mobility Overal bed mobility: Needs Assistance Bed Mobility: Supine to Sit, Sit to Supine     Supine to sit: +2 for physical assistance, HOB elevated, Used rails, Total assist, Max assist (from chair egress posture to sit upright away from back support) Sit to supine: +2 for physical assistance, HOB elevated, Used rails, Total assist   General bed mobility comments: upright sitting in bed chair egress posture (with seat of air bed deflated), pt using bil side rails for support while PTA places pillow behind her lower and mid-back. Pt does not tolerate forward seated posture away from back support for long and often c/o feeling hot and R abdominal pain, RN called to room for pain meds and arriving at end of session.    Transfers Overall transfer level: Needs assistance                 General transfer comment: DNT for safety concerns; pt c/o pain too high and more anxious with forward sitting in chair egress posture, RN going for pain meds and pt unsafe to attempt standing with +1 today from bed due to anxiety/impulsivity and decreased attention to task.    Ambulation/Gait  Stairs             Wheelchair Mobility     Tilt Bed    Modified Rankin (Stroke Patients Only)       Balance Overall balance assessment: Needs assistance Sitting-balance support: Bilateral upper extremity supported, Feet supported Sitting balance-Leahy Scale: Poor Sitting balance - Comments: Pt with posterior  lean, requiring Mod/Max A to maintain balance. BP stable Postural control: Posterior lean     Standing balance comment: defer for pt safety                            Communication Communication Communication: Impaired Factors Affecting Communication: Trach/intubated  Cognition Arousal: Alert Behavior During Therapy: Flat affect, Anxious   PT - Cognitive impairments: Difficult to assess, Initiation, Attention Difficult to assess due to: Tracheostomy                     PT - Cognition Comments: Pt alert and following commands well this date, limited due to c/o pain in R abdomen and appears more anxious with AAROM and mobility, did tolerate sitting upright in bed chair posture (with seat deflated) but mostly leaning back against support of pillow while in chair posture ~10 mins; able to make her needs known via gestures and head shakes/nods Following commands: Impaired Following commands impaired: Follows one step commands with increased time    Cueing Cueing Techniques: Verbal cues, Gestural cues, Tactile cues  Exercises General Exercises - Lower Extremity Ankle Circles/Pumps: AROM, AAROM, Both, 10 reps, Supine, Seated (AA for improved ROM) Long Arc Quad: AROM, Both, Seated, Strengthening, AAROM, 10 reps (in bed egress chair posture) Straight Leg Raises: PROM, Both, 5 reps, Supine Hip Flexion/Marching: AROM, Both, 10 reps, Seated, Strengthening, AAROM (AA for improved ROM; dense multimodal cues to continue) Other Exercises Other Exercises: seated bed "crunches" pulling on bil side rails to lean forward x3 trials, pt grimacing and limited tolerance, needs external support to maintain forward sitting Other Exercises: bil heel cord stretch x60 sec ea ankle    General Comments General comments (skin integrity, edema, etc.): see VS above; stable on trach vent; ostomy bag about 1/2 full of dark liquid, wound ostomy RN arriving at end of session and notified.       Pertinent Vitals/Pain Pain Assessment Pain Assessment: 0-10 Pain Score: 8  (5/10 prior to sitting forward in chair posture in air bed; pt using fingers to show pain score and gestures to R abdomen) Facial Expression: Grimacing Body Movements: Restlessness Muscle Tension: Relaxed Compliance with ventilator (intubated pts.): Tolerating ventilator or movement Vocalization (extubated pts.): N/A CPOT Total: 4 Pain Location: abdomen, B feet Pain Descriptors / Indicators: Discomfort, Grimacing, Guarding, Sore, Dull Pain Intervention(s): Limited activity within patient's tolerance, Monitored during session, Repositioned, Patient requesting pain meds-RN notified    Home Living                          Prior Function            PT Goals (current goals can now be found in the care plan section) Acute Rehab PT Goals PT Goal Formulation: With patient Time For Goal Achievement: 12/14/23 Progress towards PT goals: Progressing toward goals    Frequency    Min 2X/week      PT Plan      Co-evaluation              AM-PAC  PT "6 Clicks" Mobility   Outcome Measure  Help needed turning from your back to your side while in a flat bed without using bedrails?: A Little Help needed moving from lying on your back to sitting on the side of a flat bed without using bedrails?: Total Help needed moving to and from a bed to a chair (including a wheelchair)?: Total Help needed standing up from a chair using your arms (e.g., wheelchair or bedside chair)?: Total Help needed to walk in hospital room?: Total Help needed climbing 3-5 steps with a railing? : Total 6 Click Score: 8    End of Session Equipment Utilized During Treatment: Oxygen;Other (comment) (trach vent) Activity Tolerance: Patient limited by pain;Patient limited by fatigue Patient left: in bed;with call bell/phone within reach;Other (comment);with SCD's reapplied;with nursing/sitter in room (SCD machine error, and bed  alarm needs to be placed once wound ostomy RN done, primary RN notified and agreeable) Nurse Communication: Mobility status;Patient requests pain meds;Other (comment) (see above) PT Visit Diagnosis: Other abnormalities of gait and mobility (R26.89);Difficulty in walking, not elsewhere classified (R26.2);Muscle weakness (generalized) (M62.81)     Time: 1610-9604 PT Time Calculation (min) (ACUTE ONLY): 38 min  Charges:    $Therapeutic Exercise: 8-22 mins $Therapeutic Activity: 23-37 mins PT General Charges $$ ACUTE PT VISIT: 1 Visit                     Quindarius Cabello P., PTA Acute Rehabilitation Services Secure Chat Preferred 9a-5:30pm Office: [phone]    Dorathy Kinsman [facility] 12/11/2023, 12:12 PM

## 2023-12-11 NOTE — Progress Notes (Signed)
 Pharmacy Electrolyte Replacement  Recent Labs:  Recent Labs    12/11/23 0019  K 5.1  MG 2.3  PHOS 2.4*  CREATININE 0.65    Low Critical Values (K </= 2.5, Phos </= 1, Mg </= 1) Present: None  MD Contacted: n/a  Plan: 15 mmol IV NaPhos x1  Cedric Fishman, PharmD, BCPS, BCCCP Clinical Pharmacist

## 2023-12-11 NOTE — TOC Transition Note (Signed)
 Transition of Care [facility]) - Discharge Note   Patient Details  Name: Jenny White MRN: [id number] Date of Birth: Aug 06, 1946  [facility]) CM/SW Contact:  Harriet Masson, RN Phone Number: 12/11/2023, 11:37 AM   Clinical Narrative:    Patient is stable to transfer to [facility]. Notified,Theresa, of transfer.  TOC will sign off.    Final next level of care: Long Term Acute Care (LTAC) Barriers to Discharge: Continued Medical Work up   Patient Goals and CMS Choice   CMS Medicare.gov Compare Post Acute Care list provided to:: Patient Represenative (must comment) (Daughter in [place]) Choice offered to / list presented to : Adult Children      Discharge Placement                       Discharge Plan and Services Additional resources added to the After Visit Summary for     Discharge Planning Services: CM Consult Post Acute Care Choice: Long Term Acute Care (LTAC)                               Social Drivers of Health (SDOH) Interventions SDOH Screenings   Food Insecurity: No Food Insecurity (11/11/2023)  Housing: Low Risk  (11/11/2023)  Transportation Needs: No Transportation Needs (11/11/2023)  Utilities: Not At Risk (11/11/2023)  Depression (PHQ2-9): Low Risk  (06/17/2023)  Financial Resource Strain: Low Risk  (11/06/2023)   Received from [facility]  Social Connections: Unknown (11/11/2023)  Tobacco Use: Medium Risk (11/11/2023)     Readmission Risk Interventions     No data to display

## 2023-12-11 NOTE — Progress Notes (Addendum)
 Patient ID: Jenny White, female   DOB: [birth year], 78 y.o.   MRN: [id number] Would continue current therapy and monitor output Will follow prn at this point as don't think much else to do. Hopefully she can eat at some point if able to come off vent.

## 2023-12-11 NOTE — Consult Note (Signed)
 WOC Nurse ostomy follow up Stoma type/location: RLQ ileostomy, output decreasing.   Stomal assessment/size: oval pink patent and producing liquid green stool  os at center.  Appears to be a tract at 3 o'clock where scant stool is produced.  I cannot appreciate an opening, but after cleansing, stool is produced.  There is no separation at the mucocutaneous junction Peristomal assessment: erythema Treatment options for stomal/peristomal skin:  stoma powder and skin prep barrier ring Output liquid green stool Ostomy pouching: 1pc. Flexible convex Education provided: Pouch change performed.  No family in room.  Family observed last pouch change. Is discharging soon to [facility]. Enrolled patient in [place] Secure Start Discharge program: Yes Will follow through discharge.  Mike Gip MSN, RN, FNP-BC CWON Wound, Ostomy, Continence Nurse Outpatient [facility] [phone] Pager [phone]

## 2023-12-12 DIAGNOSIS — I482 Chronic atrial fibrillation, unspecified: Secondary | ICD-10-CM

## 2023-12-12 DIAGNOSIS — Z930 Tracheostomy status: Secondary | ICD-10-CM

## 2023-12-12 DIAGNOSIS — J449 Chronic obstructive pulmonary disease, unspecified: Secondary | ICD-10-CM

## 2023-12-12 DIAGNOSIS — J9621 Acute and chronic respiratory failure with hypoxia: Secondary | ICD-10-CM

## 2023-12-12 LAB — CBC WITH DIFFERENTIAL/PLATELET
Abs Immature Granulocytes: 0.28 10*3/uL — ABNORMAL HIGH (ref 0.00–0.07)
Abs Immature Granulocytes: 0.2 10*3/uL — ABNORMAL HIGH (ref 0.00–0.07)
Basophils Absolute: 0.1 10*3/uL (ref 0.0–0.1)
Basophils Absolute: 0.1 10*3/uL (ref 0.0–0.1)
Basophils Relative: 1 %
Basophils Relative: 1 %
Eosinophils Absolute: 0.3 10*3/uL (ref 0.0–0.5)
Eosinophils Absolute: 0.4 10*3/uL (ref 0.0–0.5)
Eosinophils Relative: 3 %
Eosinophils Relative: 3 %
HCT: 25.1 % — ABNORMAL LOW (ref 36.0–46.0)
HCT: 25.1 % — ABNORMAL LOW (ref 36.0–46.0)
Hemoglobin: 7.7 g/dL — ABNORMAL LOW (ref 12.0–15.0)
Hemoglobin: 7.7 g/dL — ABNORMAL LOW (ref 12.0–15.0)
Immature Granulocytes: 2 %
Immature Granulocytes: 2 %
Lymphocytes Relative: 6 %
Lymphocytes Relative: 6 %
Lymphs Abs: 0.7 10*3/uL (ref 0.7–4.0)
Lymphs Abs: 0.7 10*3/uL (ref 0.7–4.0)
MCH: 29.7 pg (ref 26.0–34.0)
MCH: 29.8 pg (ref 26.0–34.0)
MCHC: 30.7 g/dL (ref 30.0–36.0)
MCHC: 30.7 g/dL (ref 30.0–36.0)
MCV: 96.9 fL (ref 80.0–100.0)
MCV: 97.3 fL (ref 80.0–100.0)
Monocytes Absolute: 1.6 10*3/uL — ABNORMAL HIGH (ref 0.1–1.0)
Monocytes Absolute: 1.7 10*3/uL — ABNORMAL HIGH (ref 0.1–1.0)
Monocytes Relative: 13 %
Monocytes Relative: 14 %
Neutro Abs: 9.1 10*3/uL — ABNORMAL HIGH (ref 1.7–7.7)
Neutro Abs: 8.7 10*3/uL — ABNORMAL HIGH (ref 1.7–7.7)
Neutrophils Relative %: 75 %
Neutrophils Relative %: 74 %
Platelets: 371 10*3/uL (ref 150–400)
Platelets: 385 10*3/uL (ref 150–400)
RBC: 2.59 MIL/uL — ABNORMAL LOW (ref 3.87–5.11)
RBC: 2.58 MIL/uL — ABNORMAL LOW (ref 3.87–5.11)
RDW: 16.6 % — ABNORMAL HIGH (ref 11.5–15.5)
RDW: 16.8 % — ABNORMAL HIGH (ref 11.5–15.5)
WBC: 12.1 10*3/uL — ABNORMAL HIGH (ref 4.0–10.5)
WBC: 11.8 10*3/uL — ABNORMAL HIGH (ref 4.0–10.5)
nRBC: 0.5 % — ABNORMAL HIGH (ref 0.0–0.2)
nRBC: 0.6 % — ABNORMAL HIGH (ref 0.0–0.2)

## 2023-12-12 LAB — RENAL FUNCTION PANEL
Albumin: 3.3 g/dL — ABNORMAL LOW (ref 3.5–5.0)
Albumin: 3.3 g/dL — ABNORMAL LOW (ref 3.5–5.0)
Anion gap: 7 (ref 5–15)
Anion gap: 7 (ref 5–15)
BUN: 59 mg/dL — ABNORMAL HIGH (ref 8–23)
BUN: 49 mg/dL — ABNORMAL HIGH (ref 8–23)
CO2: 26 mmol/L (ref 22–32)
CO2: 28 mmol/L (ref 22–32)
Calcium: 10.4 mg/dL — ABNORMAL HIGH (ref 8.9–10.3)
Calcium: 10.7 mg/dL — ABNORMAL HIGH (ref 8.9–10.3)
Chloride: 112 mmol/L — ABNORMAL HIGH (ref 98–111)
Chloride: 110 mmol/L (ref 98–111)
Creatinine, Ser: 0.55 mg/dL (ref 0.44–1.00)
Creatinine, Ser: 0.45 mg/dL (ref 0.44–1.00)
GFR, Estimated: >60 mL/min (ref >60)
GFR, Estimated: >60 mL/min (ref >60)
Glucose, Bld: 110 mg/dL — ABNORMAL HIGH (ref 70–99)
Glucose, Bld: 95 mg/dL (ref 70–99)
Phosphorus: 2.8 mg/dL (ref 2.5–4.6)
Phosphorus: 2.7 mg/dL (ref 2.5–4.6)
Potassium: 4.5 mmol/L (ref 3.5–5.1)
Potassium: 4.4 mmol/L (ref 3.5–5.1)
Sodium: 145 mmol/L (ref 135–145)
Sodium: 145 mmol/L (ref 135–145)

## 2023-12-12 LAB — MAGNESIUM
Magnesium: 2.2 mg/dL (ref 1.7–2.4)
Magnesium: 2.3 mg/dL (ref 1.7–2.4)

## 2023-12-13 DIAGNOSIS — J9621 Acute and chronic respiratory failure with hypoxia: Secondary | ICD-10-CM

## 2023-12-13 DIAGNOSIS — I482 Chronic atrial fibrillation, unspecified: Secondary | ICD-10-CM

## 2023-12-13 DIAGNOSIS — Z930 Tracheostomy status: Secondary | ICD-10-CM

## 2023-12-13 DIAGNOSIS — J449 Chronic obstructive pulmonary disease, unspecified: Secondary | ICD-10-CM

## 2023-12-13 LAB — RENAL FUNCTION PANEL
Albumin: 3.3 g/dL — ABNORMAL LOW (ref 3.5–5.0)
Anion gap: 6 (ref 5–15)
BUN: 39 mg/dL — ABNORMAL HIGH (ref 8–23)
CO2: 29 mmol/L (ref 22–32)
Calcium: 10.5 mg/dL — ABNORMAL HIGH (ref 8.9–10.3)
Chloride: 112 mmol/L — ABNORMAL HIGH (ref 98–111)
Creatinine, Ser: 0.45 mg/dL (ref 0.44–1.00)
GFR, Estimated: >60 mL/min (ref >60)
Glucose, Bld: 105 mg/dL — ABNORMAL HIGH (ref 70–99)
Phosphorus: 2.2 mg/dL — ABNORMAL LOW (ref 2.5–4.6)
Potassium: 4.4 mmol/L (ref 3.5–5.1)
Sodium: 147 mmol/L — ABNORMAL HIGH (ref 135–145)

## 2023-12-13 LAB — CBC WITH DIFFERENTIAL/PLATELET
Abs Immature Granulocytes: 0.25 10*3/uL — ABNORMAL HIGH (ref 0.00–0.07)
Basophils Absolute: 0.1 10*3/uL (ref 0.0–0.1)
Basophils Relative: 1 %
Eosinophils Absolute: 0.4 10*3/uL (ref 0.0–0.5)
Eosinophils Relative: 3 %
HCT: 24.1 % — ABNORMAL LOW (ref 36.0–46.0)
Hemoglobin: 7.4 g/dL — ABNORMAL LOW (ref 12.0–15.0)
Immature Granulocytes: 2 %
Lymphocytes Relative: 7 %
Lymphs Abs: 0.9 10*3/uL (ref 0.7–4.0)
MCH: 29.6 pg (ref 26.0–34.0)
MCHC: 30.7 g/dL (ref 30.0–36.0)
MCV: 96.4 fL (ref 80.0–100.0)
Monocytes Absolute: 2.1 10*3/uL — ABNORMAL HIGH (ref 0.1–1.0)
Monocytes Relative: 16 %
Neutro Abs: 9.4 10*3/uL — ABNORMAL HIGH (ref 1.7–7.7)
Neutrophils Relative %: 71 %
Platelets: 378 10*3/uL (ref 150–400)
RBC: 2.5 MIL/uL — ABNORMAL LOW (ref 3.87–5.11)
RDW: 16.8 % — ABNORMAL HIGH (ref 11.5–15.5)
WBC: 13.2 10*3/uL — ABNORMAL HIGH (ref 4.0–10.5)
nRBC: 0.3 % — ABNORMAL HIGH (ref 0.0–0.2)

## 2023-12-13 LAB — CULTURE, BLOOD (ROUTINE X 2): Culture: NO GROWTH

## 2023-12-13 LAB — MAGNESIUM: Magnesium: 2 mg/dL (ref 1.7–2.4)

## 2023-12-14 LAB — CBC WITH DIFFERENTIAL/PLATELET
Abs Immature Granulocytes: 0.14 10*3/uL — ABNORMAL HIGH (ref 0.00–0.07)
Basophils Absolute: 0.1 10*3/uL (ref 0.0–0.1)
Basophils Relative: 1 %
Eosinophils Absolute: 0.6 10*3/uL — ABNORMAL HIGH (ref 0.0–0.5)
Eosinophils Relative: 5 %
HCT: 23.5 % — ABNORMAL LOW (ref 36.0–46.0)
Hemoglobin: 7.2 g/dL — ABNORMAL LOW (ref 12.0–15.0)
Immature Granulocytes: 1 %
Lymphocytes Relative: 8 %
Lymphs Abs: 1 10*3/uL (ref 0.7–4.0)
MCH: 30 pg (ref 26.0–34.0)
MCHC: 30.6 g/dL (ref 30.0–36.0)
MCV: 97.9 fL (ref 80.0–100.0)
Monocytes Absolute: 2.2 10*3/uL — ABNORMAL HIGH (ref 0.1–1.0)
Monocytes Relative: 17 %
Neutro Abs: 9 10*3/uL — ABNORMAL HIGH (ref 1.7–7.7)
Neutrophils Relative %: 68 %
Platelets: 343 10*3/uL (ref 150–400)
RBC: 2.4 MIL/uL — ABNORMAL LOW (ref 3.87–5.11)
RDW: 17 % — ABNORMAL HIGH (ref 11.5–15.5)
WBC: 13.1 10*3/uL — ABNORMAL HIGH (ref 4.0–10.5)
nRBC: 0.5 % — ABNORMAL HIGH (ref 0.0–0.2)

## 2023-12-14 LAB — RENAL FUNCTION PANEL
Albumin: 3 g/dL — ABNORMAL LOW (ref 3.5–5.0)
Anion gap: 8 (ref 5–15)
BUN: 32 mg/dL — ABNORMAL HIGH (ref 8–23)
CO2: 28 mmol/L (ref 22–32)
Calcium: 10.2 mg/dL (ref 8.9–10.3)
Chloride: 111 mmol/L (ref 98–111)
Creatinine, Ser: 0.4 mg/dL — ABNORMAL LOW (ref 0.44–1.00)
GFR, Estimated: >60 mL/min (ref >60)
Glucose, Bld: 101 mg/dL — ABNORMAL HIGH (ref 70–99)
Phosphorus: 2.2 mg/dL — ABNORMAL LOW (ref 2.5–4.6)
Potassium: 4.4 mmol/L (ref 3.5–5.1)
Sodium: 147 mmol/L — ABNORMAL HIGH (ref 135–145)

## 2023-12-14 LAB — MAGNESIUM: Magnesium: 1.9 mg/dL (ref 1.7–2.4)

## 2023-12-15 ENCOUNTER — Other Ambulatory Visit (HOSPITAL_COMMUNITY)

## 2023-12-15 LAB — BASIC METABOLIC PANEL WITH GFR
Anion gap: 8 (ref 5–15)
BUN: 36 mg/dL — ABNORMAL HIGH (ref 8–23)
CO2: 33 mmol/L — ABNORMAL HIGH (ref 22–32)
Calcium: 10.2 mg/dL (ref 8.9–10.3)
Chloride: 108 mmol/L (ref 98–111)
Creatinine, Ser: 0.46 mg/dL (ref 0.44–1.00)
GFR, Estimated: >60 mL/min (ref >60)
Glucose, Bld: 104 mg/dL — ABNORMAL HIGH (ref 70–99)
Potassium: 4.3 mmol/L (ref 3.5–5.1)
Sodium: 149 mmol/L — ABNORMAL HIGH (ref 135–145)

## 2023-12-15 LAB — CBC
HCT: 24.5 % — ABNORMAL LOW (ref 36.0–46.0)
Hemoglobin: 7.4 g/dL — ABNORMAL LOW (ref 12.0–15.0)
MCH: 30 pg (ref 26.0–34.0)
MCHC: 30.2 g/dL (ref 30.0–36.0)
MCV: 99.2 fL (ref 80.0–100.0)
Platelets: 349 10*3/uL (ref 150–400)
RBC: 2.47 MIL/uL — ABNORMAL LOW (ref 3.87–5.11)
RDW: 17 % — ABNORMAL HIGH (ref 11.5–15.5)
WBC: 11.7 10*3/uL — ABNORMAL HIGH (ref 4.0–10.5)
nRBC: 0.8 % — ABNORMAL HIGH (ref 0.0–0.2)

## 2023-12-15 LAB — LACTIC ACID, PLASMA: Lactic Acid, Venous: 0.9 mmol/L (ref 0.5–1.9)

## 2023-12-16 ENCOUNTER — Other Ambulatory Visit (HOSPITAL_COMMUNITY)

## 2023-12-16 DIAGNOSIS — J449 Chronic obstructive pulmonary disease, unspecified: Secondary | ICD-10-CM

## 2023-12-16 DIAGNOSIS — Z930 Tracheostomy status: Secondary | ICD-10-CM

## 2023-12-16 DIAGNOSIS — I482 Chronic atrial fibrillation, unspecified: Secondary | ICD-10-CM

## 2023-12-16 DIAGNOSIS — J9621 Acute and chronic respiratory failure with hypoxia: Secondary | ICD-10-CM

## 2023-12-16 LAB — FUNGUS CULTURE, BLOOD: Culture: NO GROWTH

## 2023-12-16 MED ORDER — LIDOCAINE VISCOUS HCL 2 % MT SOLN
15.0000 mL | Freq: Once | OROMUCOSAL | Status: AC
  Administered 2023-12-16: 5 mL via OROMUCOSAL

## 2023-12-16 MED ORDER — IOHEXOL 300 MG/ML  SOLN
50.0000 mL | Freq: Once | INTRAMUSCULAR | Status: AC | PRN
  Administered 2023-12-16: 15 mL

## 2023-12-17 DIAGNOSIS — Z930 Tracheostomy status: Secondary | ICD-10-CM

## 2023-12-17 DIAGNOSIS — I482 Chronic atrial fibrillation, unspecified: Secondary | ICD-10-CM

## 2023-12-17 DIAGNOSIS — J9621 Acute and chronic respiratory failure with hypoxia: Secondary | ICD-10-CM

## 2023-12-17 DIAGNOSIS — J449 Chronic obstructive pulmonary disease, unspecified: Secondary | ICD-10-CM

## 2023-12-17 LAB — BASIC METABOLIC PANEL WITH GFR
Anion gap: 9 (ref 5–15)
BUN: 31 mg/dL — ABNORMAL HIGH (ref 8–23)
CO2: 29 mmol/L (ref 22–32)
Calcium: 10.1 mg/dL (ref 8.9–10.3)
Chloride: 103 mmol/L (ref 98–111)
Creatinine, Ser: 0.54 mg/dL (ref 0.44–1.00)
GFR, Estimated: >60 mL/min (ref >60)
Glucose, Bld: 205 mg/dL — ABNORMAL HIGH (ref 70–99)
Potassium: 4.3 mmol/L (ref 3.5–5.1)
Sodium: 141 mmol/L (ref 135–145)

## 2023-12-18 DIAGNOSIS — I482 Chronic atrial fibrillation, unspecified: Secondary | ICD-10-CM

## 2023-12-18 DIAGNOSIS — Z930 Tracheostomy status: Secondary | ICD-10-CM

## 2023-12-18 DIAGNOSIS — J9621 Acute and chronic respiratory failure with hypoxia: Secondary | ICD-10-CM

## 2023-12-18 DIAGNOSIS — J449 Chronic obstructive pulmonary disease, unspecified: Secondary | ICD-10-CM

## 2023-12-19 DIAGNOSIS — Z930 Tracheostomy status: Secondary | ICD-10-CM

## 2023-12-19 DIAGNOSIS — J9621 Acute and chronic respiratory failure with hypoxia: Secondary | ICD-10-CM

## 2023-12-19 DIAGNOSIS — I482 Chronic atrial fibrillation, unspecified: Secondary | ICD-10-CM

## 2023-12-19 DIAGNOSIS — J449 Chronic obstructive pulmonary disease, unspecified: Secondary | ICD-10-CM

## 2023-12-20 DIAGNOSIS — J449 Chronic obstructive pulmonary disease, unspecified: Secondary | ICD-10-CM

## 2023-12-20 DIAGNOSIS — J9621 Acute and chronic respiratory failure with hypoxia: Secondary | ICD-10-CM

## 2023-12-20 DIAGNOSIS — I482 Chronic atrial fibrillation, unspecified: Secondary | ICD-10-CM

## 2023-12-20 DIAGNOSIS — Z930 Tracheostomy status: Secondary | ICD-10-CM

## 2023-12-21 DIAGNOSIS — I482 Chronic atrial fibrillation, unspecified: Secondary | ICD-10-CM

## 2023-12-21 DIAGNOSIS — J9621 Acute and chronic respiratory failure with hypoxia: Secondary | ICD-10-CM

## 2023-12-21 DIAGNOSIS — J449 Chronic obstructive pulmonary disease, unspecified: Secondary | ICD-10-CM

## 2023-12-21 DIAGNOSIS — Z930 Tracheostomy status: Secondary | ICD-10-CM

## 2023-12-21 LAB — BASIC METABOLIC PANEL WITH GFR
Anion gap: 7 (ref 5–15)
BUN: 39 mg/dL — ABNORMAL HIGH (ref 8–23)
CO2: 37 mmol/L — ABNORMAL HIGH (ref 22–32)
Calcium: 10.6 mg/dL — ABNORMAL HIGH (ref 8.9–10.3)
Chloride: 101 mmol/L (ref 98–111)
Creatinine, Ser: 0.43 mg/dL — ABNORMAL LOW (ref 0.44–1.00)
GFR, Estimated: >60 mL/min (ref >60)
Glucose, Bld: 125 mg/dL — ABNORMAL HIGH (ref 70–99)
Potassium: 5.1 mmol/L (ref 3.5–5.1)
Sodium: 145 mmol/L (ref 135–145)

## 2023-12-21 LAB — CBC
HCT: 27.6 % — ABNORMAL LOW (ref 36.0–46.0)
Hemoglobin: 8 g/dL — ABNORMAL LOW (ref 12.0–15.0)
MCH: 29.1 pg (ref 26.0–34.0)
MCHC: 29 g/dL — ABNORMAL LOW (ref 30.0–36.0)
MCV: 100.4 fL — ABNORMAL HIGH (ref 80.0–100.0)
Platelets: 315 10*3/uL (ref 150–400)
RBC: 2.75 MIL/uL — ABNORMAL LOW (ref 3.87–5.11)
RDW: 17.1 % — ABNORMAL HIGH (ref 11.5–15.5)
WBC: 15.2 10*3/uL — ABNORMAL HIGH (ref 4.0–10.5)
nRBC: 0.2 % (ref 0.0–0.2)

## 2023-12-22 DIAGNOSIS — J9621 Acute and chronic respiratory failure with hypoxia: Secondary | ICD-10-CM

## 2023-12-22 DIAGNOSIS — J449 Chronic obstructive pulmonary disease, unspecified: Secondary | ICD-10-CM

## 2023-12-22 DIAGNOSIS — Z930 Tracheostomy status: Secondary | ICD-10-CM

## 2023-12-22 DIAGNOSIS — I482 Chronic atrial fibrillation, unspecified: Secondary | ICD-10-CM

## 2023-12-23 DIAGNOSIS — J449 Chronic obstructive pulmonary disease, unspecified: Secondary | ICD-10-CM

## 2023-12-23 DIAGNOSIS — I482 Chronic atrial fibrillation, unspecified: Secondary | ICD-10-CM

## 2023-12-23 DIAGNOSIS — Z930 Tracheostomy status: Secondary | ICD-10-CM

## 2023-12-23 DIAGNOSIS — J9621 Acute and chronic respiratory failure with hypoxia: Secondary | ICD-10-CM

## 2023-12-23 LAB — CBC
HCT: 27.9 % — ABNORMAL LOW (ref 36.0–46.0)
Hemoglobin: 8.3 g/dL — ABNORMAL LOW (ref 12.0–15.0)
MCH: 29 pg (ref 26.0–34.0)
MCHC: 29.7 g/dL — ABNORMAL LOW (ref 30.0–36.0)
MCV: 97.6 fL (ref 80.0–100.0)
Platelets: 303 10*3/uL (ref 150–400)
RBC: 2.86 MIL/uL — ABNORMAL LOW (ref 3.87–5.11)
RDW: 17.1 % — ABNORMAL HIGH (ref 11.5–15.5)
WBC: 16.9 10*3/uL — ABNORMAL HIGH (ref 4.0–10.5)
nRBC: 0.5 % — ABNORMAL HIGH (ref 0.0–0.2)

## 2023-12-23 LAB — BASIC METABOLIC PANEL WITH GFR
Anion gap: 6 (ref 5–15)
BUN: 28 mg/dL — ABNORMAL HIGH (ref 8–23)
CO2: 37 mmol/L — ABNORMAL HIGH (ref 22–32)
Calcium: 9.9 mg/dL (ref 8.9–10.3)
Chloride: 102 mmol/L (ref 98–111)
Creatinine, Ser: 0.45 mg/dL (ref 0.44–1.00)
GFR, Estimated: >60 mL/min (ref >60)
Glucose, Bld: 158 mg/dL — ABNORMAL HIGH (ref 70–99)
Potassium: 4.5 mmol/L (ref 3.5–5.1)
Sodium: 145 mmol/L (ref 135–145)

## 2023-12-23 LAB — MAGNESIUM: Magnesium: 2 mg/dL (ref 1.7–2.4)

## 2023-12-24 ENCOUNTER — Other Ambulatory Visit (HOSPITAL_COMMUNITY)

## 2023-12-24 DIAGNOSIS — J9621 Acute and chronic respiratory failure with hypoxia: Secondary | ICD-10-CM

## 2023-12-24 DIAGNOSIS — J449 Chronic obstructive pulmonary disease, unspecified: Secondary | ICD-10-CM

## 2023-12-24 DIAGNOSIS — Z930 Tracheostomy status: Secondary | ICD-10-CM

## 2023-12-24 DIAGNOSIS — I482 Chronic atrial fibrillation, unspecified: Secondary | ICD-10-CM

## 2023-12-25 DIAGNOSIS — J449 Chronic obstructive pulmonary disease, unspecified: Secondary | ICD-10-CM

## 2023-12-25 DIAGNOSIS — Z930 Tracheostomy status: Secondary | ICD-10-CM

## 2023-12-25 DIAGNOSIS — I482 Chronic atrial fibrillation, unspecified: Secondary | ICD-10-CM

## 2023-12-25 DIAGNOSIS — J9621 Acute and chronic respiratory failure with hypoxia: Secondary | ICD-10-CM

## 2023-12-26 DIAGNOSIS — Z930 Tracheostomy status: Secondary | ICD-10-CM

## 2023-12-26 DIAGNOSIS — J449 Chronic obstructive pulmonary disease, unspecified: Secondary | ICD-10-CM

## 2023-12-26 DIAGNOSIS — I482 Chronic atrial fibrillation, unspecified: Secondary | ICD-10-CM

## 2023-12-26 DIAGNOSIS — J9621 Acute and chronic respiratory failure with hypoxia: Secondary | ICD-10-CM

## 2023-12-27 DIAGNOSIS — I482 Chronic atrial fibrillation, unspecified: Secondary | ICD-10-CM

## 2023-12-27 DIAGNOSIS — J449 Chronic obstructive pulmonary disease, unspecified: Secondary | ICD-10-CM

## 2023-12-27 DIAGNOSIS — J9621 Acute and chronic respiratory failure with hypoxia: Secondary | ICD-10-CM

## 2023-12-27 DIAGNOSIS — Z930 Tracheostomy status: Secondary | ICD-10-CM

## 2023-12-27 LAB — CBC
HCT: 26.7 % — ABNORMAL LOW (ref 36.0–46.0)
Hemoglobin: 8.2 g/dL — ABNORMAL LOW (ref 12.0–15.0)
MCH: 29.4 pg (ref 26.0–34.0)
MCHC: 30.7 g/dL (ref 30.0–36.0)
MCV: 95.7 fL (ref 80.0–100.0)
Platelets: 263 10*3/uL (ref 150–400)
RBC: 2.79 MIL/uL — ABNORMAL LOW (ref 3.87–5.11)
RDW: 17.2 % — ABNORMAL HIGH (ref 11.5–15.5)
WBC: 20 10*3/uL — ABNORMAL HIGH (ref 4.0–10.5)
nRBC: 0.7 % — ABNORMAL HIGH (ref 0.0–0.2)

## 2023-12-27 LAB — MAGNESIUM: Magnesium: 1.9 mg/dL (ref 1.7–2.4)

## 2023-12-27 LAB — BASIC METABOLIC PANEL WITH GFR
Anion gap: 8 (ref 5–15)
BUN: 27 mg/dL — ABNORMAL HIGH (ref 8–23)
CO2: 37 mmol/L — ABNORMAL HIGH (ref 22–32)
Calcium: 9.3 mg/dL (ref 8.9–10.3)
Chloride: 93 mmol/L — ABNORMAL LOW (ref 98–111)
Creatinine, Ser: 0.36 mg/dL — ABNORMAL LOW (ref 0.44–1.00)
GFR, Estimated: >60 mL/min (ref >60)
Glucose, Bld: 155 mg/dL — ABNORMAL HIGH (ref 70–99)
Potassium: 4.7 mmol/L (ref 3.5–5.1)
Sodium: 138 mmol/L (ref 135–145)

## 2023-12-28 DIAGNOSIS — Z930 Tracheostomy status: Secondary | ICD-10-CM

## 2023-12-28 DIAGNOSIS — J9621 Acute and chronic respiratory failure with hypoxia: Secondary | ICD-10-CM

## 2023-12-28 DIAGNOSIS — J449 Chronic obstructive pulmonary disease, unspecified: Secondary | ICD-10-CM

## 2023-12-28 DIAGNOSIS — I482 Chronic atrial fibrillation, unspecified: Secondary | ICD-10-CM

## 2023-12-28 LAB — BLOOD GAS, ARTERIAL
Acid-Base Excess: 15.3 mmol/L — ABNORMAL HIGH (ref 0.0–2.0)
Bicarbonate: 45.3 mmol/L — ABNORMAL HIGH (ref 20.0–28.0)
O2 Saturation: 91.6 %
Patient temperature: 37
pCO2 arterial: 84 mmHg (ref 32–48)
pH, Arterial: 7.34 — ABNORMAL LOW (ref 7.35–7.45)
pO2, Arterial: 55 mmHg — ABNORMAL LOW (ref 83–108)

## 2023-12-29 DIAGNOSIS — I482 Chronic atrial fibrillation, unspecified: Secondary | ICD-10-CM

## 2023-12-29 DIAGNOSIS — J9621 Acute and chronic respiratory failure with hypoxia: Secondary | ICD-10-CM

## 2023-12-29 DIAGNOSIS — Z930 Tracheostomy status: Secondary | ICD-10-CM

## 2023-12-29 DIAGNOSIS — J449 Chronic obstructive pulmonary disease, unspecified: Secondary | ICD-10-CM

## 2023-12-30 DIAGNOSIS — J9621 Acute and chronic respiratory failure with hypoxia: Secondary | ICD-10-CM

## 2023-12-30 DIAGNOSIS — I482 Chronic atrial fibrillation, unspecified: Secondary | ICD-10-CM

## 2023-12-30 DIAGNOSIS — Z930 Tracheostomy status: Secondary | ICD-10-CM

## 2023-12-30 DIAGNOSIS — J449 Chronic obstructive pulmonary disease, unspecified: Secondary | ICD-10-CM

## 2023-12-30 LAB — BLOOD GAS, ARTERIAL
Acid-Base Excess: 15.2 mmol/L — ABNORMAL HIGH (ref 0.0–2.0)
Bicarbonate: 44.6 mmol/L — ABNORMAL HIGH (ref 20.0–28.0)
O2 Saturation: 98.6 %
Patient temperature: 36.9
pCO2 arterial: 79 mmHg (ref 32–48)
pH, Arterial: 7.36 (ref 7.35–7.45)
pO2, Arterial: 100 mmHg (ref 83–108)

## 2023-12-30 LAB — BASIC METABOLIC PANEL WITH GFR
Anion gap: 6 (ref 5–15)
BUN: 22 mg/dL (ref 8–23)
CO2: 40 mmol/L — ABNORMAL HIGH (ref 22–32)
Calcium: 9.5 mg/dL (ref 8.9–10.3)
Chloride: 91 mmol/L — ABNORMAL LOW (ref 98–111)
Creatinine, Ser: 0.36 mg/dL — ABNORMAL LOW (ref 0.44–1.00)
GFR, Estimated: >60 mL/min (ref >60)
Glucose, Bld: 81 mg/dL (ref 70–99)
Potassium: 4.6 mmol/L (ref 3.5–5.1)
Sodium: 137 mmol/L (ref 135–145)

## 2023-12-30 LAB — CBC
HCT: 26.9 % — ABNORMAL LOW (ref 36.0–46.0)
Hemoglobin: 8.2 g/dL — ABNORMAL LOW (ref 12.0–15.0)
MCH: 29.5 pg (ref 26.0–34.0)
MCHC: 30.5 g/dL (ref 30.0–36.0)
MCV: 96.8 fL (ref 80.0–100.0)
Platelets: 237 10*3/uL (ref 150–400)
RBC: 2.78 MIL/uL — ABNORMAL LOW (ref 3.87–5.11)
RDW: 17.3 % — ABNORMAL HIGH (ref 11.5–15.5)
WBC: 16.2 10*3/uL — ABNORMAL HIGH (ref 4.0–10.5)
nRBC: 0.7 % — ABNORMAL HIGH (ref 0.0–0.2)

## 2023-12-30 LAB — MAGNESIUM: Magnesium: 2 mg/dL (ref 1.7–2.4)

## 2023-12-31 DIAGNOSIS — I482 Chronic atrial fibrillation, unspecified: Secondary | ICD-10-CM

## 2023-12-31 DIAGNOSIS — Z930 Tracheostomy status: Secondary | ICD-10-CM

## 2023-12-31 DIAGNOSIS — J449 Chronic obstructive pulmonary disease, unspecified: Secondary | ICD-10-CM

## 2023-12-31 DIAGNOSIS — J9621 Acute and chronic respiratory failure with hypoxia: Secondary | ICD-10-CM

## 2024-01-01 ENCOUNTER — Encounter (HOSPITAL_COMMUNITY)

## 2024-01-01 ENCOUNTER — Other Ambulatory Visit (HOSPITAL_COMMUNITY)

## 2024-01-01 DIAGNOSIS — J9621 Acute and chronic respiratory failure with hypoxia: Secondary | ICD-10-CM

## 2024-01-01 DIAGNOSIS — I482 Chronic atrial fibrillation, unspecified: Secondary | ICD-10-CM

## 2024-01-01 DIAGNOSIS — J449 Chronic obstructive pulmonary disease, unspecified: Secondary | ICD-10-CM

## 2024-01-01 DIAGNOSIS — Z930 Tracheostomy status: Secondary | ICD-10-CM

## 2024-01-01 LAB — BLOOD GAS, ARTERIAL
Acid-Base Excess: 14.1 mmol/L — ABNORMAL HIGH (ref 0.0–2.0)
Bicarbonate: 45.2 mmol/L — ABNORMAL HIGH (ref 20.0–28.0)
O2 Saturation: 93.2 %
Patient temperature: 36.7
pCO2 arterial: 93 mmHg (ref 32–48)
pH, Arterial: 7.29 — ABNORMAL LOW (ref 7.35–7.45)
pO2, Arterial: 60 mmHg — ABNORMAL LOW (ref 83–108)

## 2024-01-02 ENCOUNTER — Other Ambulatory Visit (HOSPITAL_COMMUNITY)

## 2024-01-02 DIAGNOSIS — Z930 Tracheostomy status: Secondary | ICD-10-CM

## 2024-01-02 DIAGNOSIS — J9621 Acute and chronic respiratory failure with hypoxia: Secondary | ICD-10-CM

## 2024-01-02 DIAGNOSIS — J449 Chronic obstructive pulmonary disease, unspecified: Secondary | ICD-10-CM

## 2024-01-02 DIAGNOSIS — I482 Chronic atrial fibrillation, unspecified: Secondary | ICD-10-CM

## 2024-01-03 DIAGNOSIS — J9621 Acute and chronic respiratory failure with hypoxia: Secondary | ICD-10-CM

## 2024-01-03 DIAGNOSIS — Z930 Tracheostomy status: Secondary | ICD-10-CM

## 2024-01-03 DIAGNOSIS — I482 Chronic atrial fibrillation, unspecified: Secondary | ICD-10-CM

## 2024-01-03 DIAGNOSIS — J449 Chronic obstructive pulmonary disease, unspecified: Secondary | ICD-10-CM

## 2024-01-04 DIAGNOSIS — I482 Chronic atrial fibrillation, unspecified: Secondary | ICD-10-CM

## 2024-01-04 DIAGNOSIS — Z930 Tracheostomy status: Secondary | ICD-10-CM

## 2024-01-04 DIAGNOSIS — J449 Chronic obstructive pulmonary disease, unspecified: Secondary | ICD-10-CM

## 2024-01-04 DIAGNOSIS — J9621 Acute and chronic respiratory failure with hypoxia: Secondary | ICD-10-CM

## 2024-01-04 LAB — COMPREHENSIVE METABOLIC PANEL WITH GFR
ALT: 34 U/L (ref 0–44)
AST: 17 U/L (ref 15–41)
Albumin: 2.2 g/dL — ABNORMAL LOW (ref 3.5–5.0)
Alkaline Phosphatase: 70 U/L (ref 38–126)
Anion gap: 8 (ref 5–15)
BUN: 16 mg/dL (ref 8–23)
CO2: 37 mmol/L — ABNORMAL HIGH (ref 22–32)
Calcium: 9.3 mg/dL (ref 8.9–10.3)
Chloride: 93 mmol/L — ABNORMAL LOW (ref 98–111)
Creatinine, Ser: 0.39 mg/dL — ABNORMAL LOW (ref 0.44–1.00)
GFR, Estimated: >60 mL/min (ref >60)
Glucose, Bld: 146 mg/dL — ABNORMAL HIGH (ref 70–99)
Potassium: 3.9 mmol/L (ref 3.5–5.1)
Sodium: 138 mmol/L (ref 135–145)
Total Bilirubin: 0.6 mg/dL (ref 0.0–1.2)
Total Protein: 4.8 g/dL — ABNORMAL LOW (ref 6.5–8.1)

## 2024-01-04 LAB — CULTURE, RESPIRATORY W GRAM STAIN

## 2024-01-04 LAB — CBC WITH DIFFERENTIAL/PLATELET
Abs Immature Granulocytes: 0 10*3/uL (ref 0.00–0.07)
Basophils Absolute: 0 10*3/uL (ref 0.0–0.1)
Basophils Relative: 0 %
Eosinophils Absolute: 0 10*3/uL (ref 0.0–0.5)
Eosinophils Relative: 0 %
HCT: 25.8 % — ABNORMAL LOW (ref 36.0–46.0)
Hemoglobin: 8.3 g/dL — ABNORMAL LOW (ref 12.0–15.0)
Lymphocytes Relative: 1 %
Lymphs Abs: 0.2 10*3/uL — ABNORMAL LOW (ref 0.7–4.0)
MCH: 29.4 pg (ref 26.0–34.0)
MCHC: 32.2 g/dL (ref 30.0–36.0)
MCV: 91.5 fL (ref 80.0–100.0)
Monocytes Absolute: 0.6 10*3/uL (ref 0.1–1.0)
Monocytes Relative: 4 %
Neutro Abs: 14.6 10*3/uL — ABNORMAL HIGH (ref 1.7–7.7)
Neutrophils Relative %: 95 %
Platelets: 216 10*3/uL (ref 150–400)
RBC: 2.82 MIL/uL — ABNORMAL LOW (ref 3.87–5.11)
RDW: 17.7 % — ABNORMAL HIGH (ref 11.5–15.5)
WBC: 15.4 10*3/uL — ABNORMAL HIGH (ref 4.0–10.5)
nRBC: 1.1 % — ABNORMAL HIGH (ref 0.0–0.2)
nRBC: 5 /100{WBCs} — ABNORMAL HIGH

## 2024-01-05 DIAGNOSIS — J9621 Acute and chronic respiratory failure with hypoxia: Secondary | ICD-10-CM

## 2024-01-05 DIAGNOSIS — J449 Chronic obstructive pulmonary disease, unspecified: Secondary | ICD-10-CM

## 2024-01-05 DIAGNOSIS — Z930 Tracheostomy status: Secondary | ICD-10-CM

## 2024-01-05 DIAGNOSIS — I482 Chronic atrial fibrillation, unspecified: Secondary | ICD-10-CM

## 2024-01-05 LAB — COMPREHENSIVE METABOLIC PANEL WITH GFR
ALT: 29 U/L (ref 0–44)
AST: 16 U/L (ref 15–41)
Albumin: 2.1 g/dL — ABNORMAL LOW (ref 3.5–5.0)
Alkaline Phosphatase: 74 U/L (ref 38–126)
Anion gap: 10 (ref 5–15)
BUN: 20 mg/dL (ref 8–23)
CO2: 35 mmol/L — ABNORMAL HIGH (ref 22–32)
Calcium: 9.3 mg/dL (ref 8.9–10.3)
Chloride: 92 mmol/L — ABNORMAL LOW (ref 98–111)
Creatinine, Ser: 0.43 mg/dL — ABNORMAL LOW (ref 0.44–1.00)
GFR, Estimated: >60 mL/min (ref >60)
Glucose, Bld: 108 mg/dL — ABNORMAL HIGH (ref 70–99)
Potassium: 4.1 mmol/L (ref 3.5–5.1)
Sodium: 137 mmol/L (ref 135–145)
Total Bilirubin: 0.6 mg/dL (ref 0.0–1.2)
Total Protein: 4.9 g/dL — ABNORMAL LOW (ref 6.5–8.1)

## 2024-01-05 LAB — BLOOD GAS, ARTERIAL
Acid-Base Excess: 12.9 mmol/L — ABNORMAL HIGH (ref 0.0–2.0)
Acid-Base Excess: 14 mmol/L — ABNORMAL HIGH (ref 0.0–2.0)
Bicarbonate: 41.6 mmol/L — ABNORMAL HIGH (ref 20.0–28.0)
Bicarbonate: 42.4 mmol/L — ABNORMAL HIGH (ref 20.0–28.0)
O2 Saturation: 94.6 %
O2 Saturation: 97.4 %
Patient temperature: 37.2
Patient temperature: 37
pCO2 arterial: 73 mmHg (ref 32–48)
pCO2 arterial: 70 mmHg (ref 32–48)
pH, Arterial: 7.37 (ref 7.35–7.45)
pH, Arterial: 7.39 (ref 7.35–7.45)
pO2, Arterial: 69 mmHg — ABNORMAL LOW (ref 83–108)
pO2, Arterial: 72 mmHg — ABNORMAL LOW (ref 83–108)

## 2024-01-05 LAB — CBC
HCT: 24.5 % — ABNORMAL LOW (ref 36.0–46.0)
Hemoglobin: 7.9 g/dL — ABNORMAL LOW (ref 12.0–15.0)
MCH: 29.5 pg (ref 26.0–34.0)
MCHC: 32.2 g/dL (ref 30.0–36.0)
MCV: 91.4 fL (ref 80.0–100.0)
Platelets: 230 10*3/uL (ref 150–400)
RBC: 2.68 MIL/uL — ABNORMAL LOW (ref 3.87–5.11)
RDW: 17.9 % — ABNORMAL HIGH (ref 11.5–15.5)
WBC: 15.9 10*3/uL — ABNORMAL HIGH (ref 4.0–10.5)
nRBC: 0.9 % — ABNORMAL HIGH (ref 0.0–0.2)

## 2024-01-06 DIAGNOSIS — I482 Chronic atrial fibrillation, unspecified: Secondary | ICD-10-CM

## 2024-01-06 DIAGNOSIS — Z930 Tracheostomy status: Secondary | ICD-10-CM

## 2024-01-06 DIAGNOSIS — J449 Chronic obstructive pulmonary disease, unspecified: Secondary | ICD-10-CM

## 2024-01-06 DIAGNOSIS — J9621 Acute and chronic respiratory failure with hypoxia: Secondary | ICD-10-CM

## 2024-01-06 NOTE — Progress Notes (Signed)
 Interventional Radiology progress note concerning g-tube request for this patient admitted to [facility]:   0954: Spoke to Lenetta Quest (son) this AM to obtain consent in preparation for gastrostomy tube placement as planned 4/30.   He states that family would not like this procedure performed at this time. Shared with him reasoning behind including g-tube in care plan. He will speak more to his family and call team back.  Patient case placed on hold with IR at this time. 1500: Called Tranese Delawder to discuss family decision. They maintain that they would not like G tube at this time.   IR consult will be discontinued. Please do not hesitate to reach back out with new consult if family would like care to include gastrostomy tube placement.

## 2024-01-07 DIAGNOSIS — J9621 Acute and chronic respiratory failure with hypoxia: Secondary | ICD-10-CM

## 2024-01-07 DIAGNOSIS — J449 Chronic obstructive pulmonary disease, unspecified: Secondary | ICD-10-CM

## 2024-01-07 DIAGNOSIS — Z930 Tracheostomy status: Secondary | ICD-10-CM

## 2024-01-07 DIAGNOSIS — I482 Chronic atrial fibrillation, unspecified: Secondary | ICD-10-CM

## 2024-01-08 ENCOUNTER — Other Ambulatory Visit (HOSPITAL_COMMUNITY)

## 2024-01-08 DIAGNOSIS — Z930 Tracheostomy status: Secondary | ICD-10-CM

## 2024-01-08 DIAGNOSIS — J449 Chronic obstructive pulmonary disease, unspecified: Secondary | ICD-10-CM

## 2024-01-08 DIAGNOSIS — I482 Chronic atrial fibrillation, unspecified: Secondary | ICD-10-CM

## 2024-01-08 DIAGNOSIS — J9621 Acute and chronic respiratory failure with hypoxia: Secondary | ICD-10-CM

## 2024-01-08 LAB — BASIC METABOLIC PANEL WITH GFR
Anion gap: 9 (ref 5–15)
BUN: 29 mg/dL — ABNORMAL HIGH (ref 8–23)
CO2: 33 mmol/L — ABNORMAL HIGH (ref 22–32)
Calcium: 9.1 mg/dL (ref 8.9–10.3)
Chloride: 96 mmol/L — ABNORMAL LOW (ref 98–111)
Creatinine, Ser: 0.45 mg/dL (ref 0.44–1.00)
GFR, Estimated: >60 mL/min (ref >60)
Glucose, Bld: 98 mg/dL (ref 70–99)
Potassium: 4.2 mmol/L (ref 3.5–5.1)
Sodium: 138 mmol/L (ref 135–145)

## 2024-01-08 LAB — BLOOD GAS, ARTERIAL
Acid-Base Excess: 13 mmol/L — ABNORMAL HIGH (ref 0.0–2.0)
Bicarbonate: 40.9 mmol/L — ABNORMAL HIGH (ref 20.0–28.0)
O2 Saturation: 98.9 %
Patient temperature: 35.6
pCO2 arterial: 62 mmHg — ABNORMAL HIGH (ref 32–48)
pH, Arterial: 7.42 (ref 7.35–7.45)
pO2, Arterial: 79 mmHg — ABNORMAL LOW (ref 83–108)

## 2024-01-08 LAB — CBC
HCT: 23.9 % — ABNORMAL LOW (ref 36.0–46.0)
Hemoglobin: 7.6 g/dL — ABNORMAL LOW (ref 12.0–15.0)
MCH: 29.6 pg (ref 26.0–34.0)
MCHC: 31.8 g/dL (ref 30.0–36.0)
MCV: 93 fL (ref 80.0–100.0)
Platelets: 189 10*3/uL (ref 150–400)
RBC: 2.57 MIL/uL — ABNORMAL LOW (ref 3.87–5.11)
RDW: 18 % — ABNORMAL HIGH (ref 11.5–15.5)
WBC: 14.7 10*3/uL — ABNORMAL HIGH (ref 4.0–10.5)
nRBC: 0.5 % — ABNORMAL HIGH (ref 0.0–0.2)

## 2024-01-08 LAB — MAGNESIUM: Magnesium: 2 mg/dL (ref 1.7–2.4)

## 2024-01-08 LAB — TROPONIN I (HIGH SENSITIVITY): Troponin I (High Sensitivity): 11 ng/L (ref <18)

## 2024-01-08 LAB — PREPARE RBC (CROSSMATCH)

## 2024-01-08 MED ORDER — LIDOCAINE HCL 1 % IJ SOLN
20.0000 mL | Freq: Once | INTRAMUSCULAR | Status: AC
  Administered 2024-01-08: 10 mL via INTRADERMAL

## 2024-01-08 NOTE — Procedures (Signed)
 PROCEDURE SUMMARY:  Successful image-guided diagnostic and therapeutic thoracentesis from the left chest.  Yielded 500 milliliters of yellow fluid.  No immediate complications.  EBL: zero Patient tolerated well.   Specimen not sent for labs.  Post-procedure CXR ordered.   Please see imaging section of Epic for full dictation.  Braniya Farrugia NP 01/08/2024 4:03 PM

## 2024-01-08 DEATH — deceased

## 2024-01-09 ENCOUNTER — Other Ambulatory Visit: Payer: Self-pay

## 2024-01-09 DIAGNOSIS — J9621 Acute and chronic respiratory failure with hypoxia: Secondary | ICD-10-CM

## 2024-01-09 DIAGNOSIS — Z930 Tracheostomy status: Secondary | ICD-10-CM

## 2024-01-09 DIAGNOSIS — J449 Chronic obstructive pulmonary disease, unspecified: Secondary | ICD-10-CM

## 2024-01-09 DIAGNOSIS — I482 Chronic atrial fibrillation, unspecified: Secondary | ICD-10-CM

## 2024-01-09 LAB — BPAM RBC
Blood Product Expiration Date: 202505262359
ISSUE DATE / TIME: 202505011702
Unit Type and Rh: 8400

## 2024-01-09 LAB — TYPE AND SCREEN
ABO/RH(D): AB POS
Antibody Screen: NEGATIVE
Unit division: 0

## 2024-01-09 MED ORDER — DEXTROSE 5 % IV SOLN: 2.0000 g | Freq: Three times a day (TID) | INTRAVENOUS | Status: DC

## 2024-01-09 NOTE — Consult Note (Addendum)
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 distal stomach, partially obscured by spinal hardware. Bibasilar opacities, favor atelectasis. Electronically Signed   By: Janeece Mechanic M.D.   On: 12/11/2023 17:05    Labs:  CBC: Recent Labs    01/04/24 0408 01/05/24 0409 01/08/24 0203 01/09/24 0525  WBC 15.4* 15.9* 14.7* 10.8*  HGB 8.3* 7.9* 7.6* 9.9*  HCT 25.8* 24.5* 23.9* 30.0*  PLT 216 230 189 221    COAGS: Recent Labs    11/12/23 0250  INR 1.0    BMP: Recent Labs    12/30/23 0539 01/04/24 0408 01/05/24 0409 01/08/24 0203  NA 137 138 137 138  K 4.6 3.9 4.1 4.2  CL 91* 93* 92* 96*  CO2 40* 37* 35* 33*  GLUCOSE 81 146* 108* 98  BUN 22 16 20  29*  CALCIUM  9.5 9.3 9.3 9.1  CREATININE 0.36* 0.39* 0.43* 0.45  GFRNONAA >60 >60 >60 >60    LIVER FUNCTION TESTS: Recent Labs    11/24/23 0557 11/25/23 0623 11/27/23 0220 12/13/23 0358 12/14/23 0342 01/04/24 0408 01/05/24 0409  BILITOT 0.5 0.6  --   --   --  0.6 0.6  AST 37 28  --   --   --  17 16  ALT 116* 86*  --   --   --  34 29  ALKPHOS 102 103  --   --   --   70 74  PROT 4.6* 5.2*  --   --   --  4.8* 4.9*  ALBUMIN  2.0* 2.1*   < > 3.3* 3.0* 2.2* 2.1*   < > = values in this interval not displayed.    TUMOR MARKERS: No results for input(s): "AFPTM", "CEA", "CA199", "CHROMGRNA" in the last 8760 hours.  Assessment and Plan:  Request for  image guided percutaneous gastrostomy tube placement No contraindications for procedure identified in ROS, physical exam, or review of pre-sedation considerations.  Labs reviewed and within acceptable range after transfusion yesterday. Will repeat INR, CBC on 5/5 AM. 01/02/24 abd CT imaging available and reviewed.  VSS, afebrile. Approved by Dr. Marlena Sima for 01/12/24 after eliquis  washout over the weekend.  Hold tube feeds Monday starting MN for 5/5 procedure.  Risks and benefits image guided gastrostomy tube placement was discussed with the patient's son Nylla Bubier including, but not limited to the need for a barium enema during the procedure, bleeding, infection, peritonitis and/or damage to adjacent structures.  All of the patient's questions were answered, patient is agreeable to proceed.  Consent signed and in chart.     Thank you for allowing our service to participate in INGEBORG SCHRACK 's care.    Electronically Signed: Terressa Fess, NP   01/09/2024, 12:29 PM     I spent a total of 40 Minutes    in face to face in clinical consultation, greater than 50% of which was counseling/coordinating care for image guided gastrostomy tube placement   (A copy of this note was sent to the referring provider and the time of visit.)

## 2024-01-10 DIAGNOSIS — I482 Chronic atrial fibrillation, unspecified: Secondary | ICD-10-CM

## 2024-01-10 DIAGNOSIS — J9621 Acute and chronic respiratory failure with hypoxia: Secondary | ICD-10-CM

## 2024-01-10 DIAGNOSIS — J449 Chronic obstructive pulmonary disease, unspecified: Secondary | ICD-10-CM

## 2024-01-10 DIAGNOSIS — Z930 Tracheostomy status: Secondary | ICD-10-CM

## 2024-01-10 LAB — CBC WITH DIFFERENTIAL/PLATELET
Abs Immature Granulocytes: 0.06 10*3/uL (ref 0.00–0.07)
Basophils Absolute: 0 10*3/uL (ref 0.0–0.1)
Basophils Relative: 0 %
Eosinophils Absolute: 0 10*3/uL (ref 0.0–0.5)
Eosinophils Relative: 0 %
HCT: 30 % — ABNORMAL LOW (ref 36.0–46.0)
Hemoglobin: 9.9 g/dL — ABNORMAL LOW (ref 12.0–15.0)
Immature Granulocytes: 1 %
Lymphocytes Relative: 3 %
Lymphs Abs: 0.3 10*3/uL — ABNORMAL LOW (ref 0.7–4.0)
MCH: 29 pg (ref 26.0–34.0)
MCHC: 33 g/dL (ref 30.0–36.0)
MCV: 88 fL (ref 80.0–100.0)
Monocytes Absolute: 0.5 10*3/uL (ref 0.1–1.0)
Monocytes Relative: 5 %
Neutro Abs: 9.9 10*3/uL — ABNORMAL HIGH (ref 1.7–7.7)
Neutrophils Relative %: 91 %
Platelets: 221 10*3/uL (ref 150–400)
RBC: 3.41 MIL/uL — ABNORMAL LOW (ref 3.87–5.11)
RDW: 17.2 % — ABNORMAL HIGH (ref 11.5–15.5)
WBC: 10.8 10*3/uL — ABNORMAL HIGH (ref 4.0–10.5)
nRBC: 0.6 % — ABNORMAL HIGH (ref 0.0–0.2)

## 2024-01-11 DIAGNOSIS — J449 Chronic obstructive pulmonary disease, unspecified: Secondary | ICD-10-CM

## 2024-01-11 DIAGNOSIS — J9621 Acute and chronic respiratory failure with hypoxia: Secondary | ICD-10-CM

## 2024-01-11 DIAGNOSIS — Z930 Tracheostomy status: Secondary | ICD-10-CM

## 2024-01-11 DIAGNOSIS — I482 Chronic atrial fibrillation, unspecified: Secondary | ICD-10-CM

## 2024-01-12 ENCOUNTER — Encounter: Payer: Self-pay | Admitting: Internal Medicine

## 2024-01-12 ENCOUNTER — Other Ambulatory Visit (HOSPITAL_COMMUNITY)

## 2024-01-12 DIAGNOSIS — J449 Chronic obstructive pulmonary disease, unspecified: Secondary | ICD-10-CM

## 2024-01-12 DIAGNOSIS — J9621 Acute and chronic respiratory failure with hypoxia: Secondary | ICD-10-CM

## 2024-01-12 DIAGNOSIS — Z930 Tracheostomy status: Secondary | ICD-10-CM

## 2024-01-12 DIAGNOSIS — I482 Chronic atrial fibrillation, unspecified: Secondary | ICD-10-CM

## 2024-01-12 LAB — COMPREHENSIVE METABOLIC PANEL WITH GFR
ALT: 40 U/L (ref 0–44)
AST: 19 U/L (ref 15–41)
Albumin: 2 g/dL — ABNORMAL LOW (ref 3.5–5.0)
Alkaline Phosphatase: 80 U/L (ref 38–126)
Anion gap: 8 (ref 5–15)
BUN: 23 mg/dL (ref 8–23)
CO2: 29 mmol/L (ref 22–32)
Calcium: 8.9 mg/dL (ref 8.9–10.3)
Chloride: 99 mmol/L (ref 98–111)
Creatinine, Ser: 0.47 mg/dL (ref 0.44–1.00)
GFR, Estimated: >60 mL/min (ref >60)
Glucose, Bld: 72 mg/dL (ref 70–99)
Potassium: 4 mmol/L (ref 3.5–5.1)
Sodium: 136 mmol/L (ref 135–145)
Total Bilirubin: 0.6 mg/dL (ref 0.0–1.2)
Total Protein: 5 g/dL — ABNORMAL LOW (ref 6.5–8.1)

## 2024-01-12 LAB — CBC
HCT: 27.9 % — ABNORMAL LOW (ref 36.0–46.0)
Hemoglobin: 9.1 g/dL — ABNORMAL LOW (ref 12.0–15.0)
MCH: 29.1 pg (ref 26.0–34.0)
MCHC: 32.6 g/dL (ref 30.0–36.0)
MCV: 89.1 fL (ref 80.0–100.0)
Platelets: 230 10*3/uL (ref 150–400)
RBC: 3.13 MIL/uL — ABNORMAL LOW (ref 3.87–5.11)
RDW: 17.2 % — ABNORMAL HIGH (ref 11.5–15.5)
WBC: 6.2 10*3/uL (ref 4.0–10.5)
nRBC: 0.5 % — ABNORMAL HIGH (ref 0.0–0.2)

## 2024-01-12 LAB — PROTIME-INR
INR: 1 (ref 0.8–1.2)
Prothrombin Time: 13.7 s (ref 11.4–15.2)

## 2024-01-12 MED ORDER — FENTANYL CITRATE (PF) 100 MCG/2ML IJ SOLN
INTRAMUSCULAR | Status: AC | PRN
  Administered 2024-01-12: 25 ug via INTRAVENOUS
  Administered 2024-01-12: 50 ug via INTRAVENOUS

## 2024-01-12 MED ORDER — CEFAZOLIN SODIUM-DEXTROSE 2-4 GM/100ML-% IV SOLN
INTRAVENOUS | Status: AC | PRN
  Administered 2024-01-12: 2 g via INTRAVENOUS

## 2024-01-12 MED ORDER — LIDOCAINE VISCOUS HCL 2 % MT SOLN: 15.0000 mL | OROMUCOSAL | Status: AC

## 2024-01-12 MED ORDER — GLUCAGON HCL RDNA (DIAGNOSTIC) 1 MG IJ SOLR
INTRAMUSCULAR | Status: AC | PRN
  Administered 2024-01-12: 1 mg via INTRAVENOUS

## 2024-01-12 MED ORDER — LIDOCAINE HCL (PF) 1 % IJ SOLN
20.0000 mL | Freq: Once | INTRAMUSCULAR | Status: AC
  Administered 2024-01-12: 20 mL via INTRADERMAL

## 2024-01-12 MED ORDER — MIDAZOLAM HCL 2 MG/2ML IJ SOLN
INTRAMUSCULAR | Status: AC | PRN
  Administered 2024-01-12: .5 mg via INTRAVENOUS

## 2024-01-12 MED ORDER — IOHEXOL 300 MG/ML  SOLN
50.0000 mL | Freq: Once | INTRAMUSCULAR | Status: DC | PRN
Start: 2024-01-12 — End: 2024-02-09

## 2024-01-12 NOTE — Procedures (Signed)
 VIR Procedure Note  Preop Dx: pneumonia Post Dx: pneumonia  Procedure: G tube placement Operator: Reagan Camera MD  EBL: None Complications: No immediate  Findings: Successful G tube placement.  NGT removed during procedure. Disposition: Return to floor in stable condition  Recommendations: May use immediately for meds. May resume G tube feeds tomorrow.  Please call with questions, concerns, or change in patient condition  Reagan Camera MD

## 2024-01-13 DIAGNOSIS — J9621 Acute and chronic respiratory failure with hypoxia: Secondary | ICD-10-CM

## 2024-01-13 DIAGNOSIS — Z930 Tracheostomy status: Secondary | ICD-10-CM

## 2024-01-13 DIAGNOSIS — I482 Chronic atrial fibrillation, unspecified: Secondary | ICD-10-CM

## 2024-01-13 DIAGNOSIS — J449 Chronic obstructive pulmonary disease, unspecified: Secondary | ICD-10-CM

## 2024-01-14 DIAGNOSIS — J9621 Acute and chronic respiratory failure with hypoxia: Secondary | ICD-10-CM

## 2024-01-14 DIAGNOSIS — Z930 Tracheostomy status: Secondary | ICD-10-CM

## 2024-01-14 DIAGNOSIS — I482 Chronic atrial fibrillation, unspecified: Secondary | ICD-10-CM

## 2024-01-14 DIAGNOSIS — J449 Chronic obstructive pulmonary disease, unspecified: Secondary | ICD-10-CM

## 2024-01-15 DIAGNOSIS — Z930 Tracheostomy status: Secondary | ICD-10-CM

## 2024-01-15 DIAGNOSIS — J9621 Acute and chronic respiratory failure with hypoxia: Secondary | ICD-10-CM

## 2024-01-15 DIAGNOSIS — J449 Chronic obstructive pulmonary disease, unspecified: Secondary | ICD-10-CM

## 2024-01-15 DIAGNOSIS — I482 Chronic atrial fibrillation, unspecified: Secondary | ICD-10-CM

## 2024-01-16 DIAGNOSIS — J9621 Acute and chronic respiratory failure with hypoxia: Secondary | ICD-10-CM

## 2024-01-16 DIAGNOSIS — Z930 Tracheostomy status: Secondary | ICD-10-CM

## 2024-01-16 DIAGNOSIS — I482 Chronic atrial fibrillation, unspecified: Secondary | ICD-10-CM

## 2024-01-16 DIAGNOSIS — J449 Chronic obstructive pulmonary disease, unspecified: Secondary | ICD-10-CM

## 2024-01-19 DIAGNOSIS — J449 Chronic obstructive pulmonary disease, unspecified: Secondary | ICD-10-CM

## 2024-01-19 DIAGNOSIS — J9621 Acute and chronic respiratory failure with hypoxia: Secondary | ICD-10-CM

## 2024-01-19 DIAGNOSIS — I482 Chronic atrial fibrillation, unspecified: Secondary | ICD-10-CM

## 2024-01-19 DIAGNOSIS — Z930 Tracheostomy status: Secondary | ICD-10-CM

## 2024-01-19 LAB — CBC WITH DIFFERENTIAL/PLATELET
Abs Immature Granulocytes: 0 10*3/uL (ref 0.00–0.07)
Basophils Absolute: 0 10*3/uL (ref 0.0–0.1)
Basophils Relative: 0 %
Eosinophils Absolute: 0.2 10*3/uL (ref 0.0–0.5)
Eosinophils Relative: 2 %
HCT: 27.5 % — ABNORMAL LOW (ref 36.0–46.0)
Hemoglobin: 8.7 g/dL — ABNORMAL LOW (ref 12.0–15.0)
Lymphocytes Relative: 6 %
Lymphs Abs: 0.5 10*3/uL — ABNORMAL LOW (ref 0.7–4.0)
MCH: 28.8 pg (ref 26.0–34.0)
MCHC: 31.6 g/dL (ref 30.0–36.0)
MCV: 91.1 fL (ref 80.0–100.0)
Monocytes Absolute: 0.2 10*3/uL (ref 0.1–1.0)
Monocytes Relative: 2 %
Neutro Abs: 7.9 10*3/uL — ABNORMAL HIGH (ref 1.7–7.7)
Neutrophils Relative %: 90 %
Platelets: 377 10*3/uL (ref 150–400)
RBC: 3.02 MIL/uL — ABNORMAL LOW (ref 3.87–5.11)
RDW: 17.7 % — ABNORMAL HIGH (ref 11.5–15.5)
WBC: 8.8 10*3/uL (ref 4.0–10.5)
nRBC: 11 /100{WBCs} — ABNORMAL HIGH
nRBC: 3 % — ABNORMAL HIGH (ref 0.0–0.2)

## 2024-01-19 LAB — COMPREHENSIVE METABOLIC PANEL WITH GFR
ALT: 18 U/L (ref 0–44)
AST: 16 U/L (ref 15–41)
Albumin: 1.7 g/dL — ABNORMAL LOW (ref 3.5–5.0)
Alkaline Phosphatase: 75 U/L (ref 38–126)
Anion gap: 11 (ref 5–15)
BUN: 19 mg/dL (ref 8–23)
CO2: 30 mmol/L (ref 22–32)
Calcium: 9.3 mg/dL (ref 8.9–10.3)
Chloride: 101 mmol/L (ref 98–111)
Creatinine, Ser: 0.38 mg/dL — ABNORMAL LOW (ref 0.44–1.00)
GFR, Estimated: >60 mL/min (ref >60)
Glucose, Bld: 90 mg/dL (ref 70–99)
Potassium: 4.4 mmol/L (ref 3.5–5.1)
Sodium: 142 mmol/L (ref 135–145)
Total Bilirubin: 0.4 mg/dL (ref 0.0–1.2)
Total Protein: 5.1 g/dL — ABNORMAL LOW (ref 6.5–8.1)

## 2024-01-20 DIAGNOSIS — I482 Chronic atrial fibrillation, unspecified: Secondary | ICD-10-CM

## 2024-01-20 DIAGNOSIS — J9621 Acute and chronic respiratory failure with hypoxia: Secondary | ICD-10-CM

## 2024-01-20 DIAGNOSIS — Z930 Tracheostomy status: Secondary | ICD-10-CM

## 2024-01-20 DIAGNOSIS — J449 Chronic obstructive pulmonary disease, unspecified: Secondary | ICD-10-CM

## 2024-01-21 DIAGNOSIS — I482 Chronic atrial fibrillation, unspecified: Secondary | ICD-10-CM

## 2024-01-21 DIAGNOSIS — J9621 Acute and chronic respiratory failure with hypoxia: Secondary | ICD-10-CM

## 2024-01-21 DIAGNOSIS — J449 Chronic obstructive pulmonary disease, unspecified: Secondary | ICD-10-CM

## 2024-01-21 DIAGNOSIS — Z930 Tracheostomy status: Secondary | ICD-10-CM

## 2024-01-22 DIAGNOSIS — J9621 Acute and chronic respiratory failure with hypoxia: Secondary | ICD-10-CM

## 2024-01-22 DIAGNOSIS — Z930 Tracheostomy status: Secondary | ICD-10-CM

## 2024-01-22 DIAGNOSIS — J449 Chronic obstructive pulmonary disease, unspecified: Secondary | ICD-10-CM

## 2024-01-22 DIAGNOSIS — I482 Chronic atrial fibrillation, unspecified: Secondary | ICD-10-CM

## 2024-01-22 LAB — BLOOD GAS, ARTERIAL
Acid-Base Excess: 17.5 mmol/L — ABNORMAL HIGH (ref 0.0–2.0)
Bicarbonate: 44.8 mmol/L — ABNORMAL HIGH (ref 20.0–28.0)
O2 Saturation: 95.7 %
Patient temperature: 37
pCO2 arterial: 63 mmHg — ABNORMAL HIGH (ref 32–48)
pH, Arterial: 7.46 — ABNORMAL HIGH (ref 7.35–7.45)
pO2, Arterial: 64 mmHg — ABNORMAL LOW (ref 83–108)

## 2024-01-23 DIAGNOSIS — J9621 Acute and chronic respiratory failure with hypoxia: Secondary | ICD-10-CM

## 2024-01-23 DIAGNOSIS — J449 Chronic obstructive pulmonary disease, unspecified: Secondary | ICD-10-CM

## 2024-01-23 DIAGNOSIS — I482 Chronic atrial fibrillation, unspecified: Secondary | ICD-10-CM

## 2024-01-23 DIAGNOSIS — Z930 Tracheostomy status: Secondary | ICD-10-CM

## 2024-01-24 LAB — CBC
HCT: 29 % — ABNORMAL LOW (ref 36.0–46.0)
Hemoglobin: 9 g/dL — ABNORMAL LOW (ref 12.0–15.0)
MCH: 28.3 pg (ref 26.0–34.0)
MCHC: 31 g/dL (ref 30.0–36.0)
MCV: 91.2 fL (ref 80.0–100.0)
Platelets: 528 10*3/uL — ABNORMAL HIGH (ref 150–400)
RBC: 3.18 MIL/uL — ABNORMAL LOW (ref 3.87–5.11)
RDW: 17.5 % — ABNORMAL HIGH (ref 11.5–15.5)
WBC: 17.4 10*3/uL — ABNORMAL HIGH (ref 4.0–10.5)
nRBC: 0.9 % — ABNORMAL HIGH (ref 0.0–0.2)

## 2024-01-24 LAB — BASIC METABOLIC PANEL WITH GFR
Anion gap: 9 (ref 5–15)
BUN: 25 mg/dL — ABNORMAL HIGH (ref 8–23)
CO2: 33 mmol/L — ABNORMAL HIGH (ref 22–32)
Calcium: 9.2 mg/dL (ref 8.9–10.3)
Chloride: 99 mmol/L (ref 98–111)
Creatinine, Ser: 0.36 mg/dL — ABNORMAL LOW (ref 0.44–1.00)
GFR, Estimated: >60 mL/min (ref >60)
Glucose, Bld: 105 mg/dL — ABNORMAL HIGH (ref 70–99)
Potassium: 4.5 mmol/L (ref 3.5–5.1)
Sodium: 141 mmol/L (ref 135–145)

## 2024-01-26 DIAGNOSIS — Z930 Tracheostomy status: Secondary | ICD-10-CM

## 2024-01-26 DIAGNOSIS — J9621 Acute and chronic respiratory failure with hypoxia: Secondary | ICD-10-CM

## 2024-01-26 DIAGNOSIS — J449 Chronic obstructive pulmonary disease, unspecified: Secondary | ICD-10-CM

## 2024-01-26 DIAGNOSIS — I482 Chronic atrial fibrillation, unspecified: Secondary | ICD-10-CM

## 2024-01-27 DIAGNOSIS — I482 Chronic atrial fibrillation, unspecified: Secondary | ICD-10-CM

## 2024-01-27 DIAGNOSIS — J449 Chronic obstructive pulmonary disease, unspecified: Secondary | ICD-10-CM

## 2024-01-27 DIAGNOSIS — J9621 Acute and chronic respiratory failure with hypoxia: Secondary | ICD-10-CM

## 2024-01-27 DIAGNOSIS — Z930 Tracheostomy status: Secondary | ICD-10-CM

## 2024-01-27 LAB — BLOOD GAS, ARTERIAL
Acid-Base Excess: 15.3 mmol/L — ABNORMAL HIGH (ref 0.0–2.0)
Bicarbonate: 41.2 mmol/L — ABNORMAL HIGH (ref 20.0–28.0)
O2 Saturation: 95.3 %
Patient temperature: 36.4
pCO2 arterial: 53 mmHg — ABNORMAL HIGH (ref 32–48)
pH, Arterial: 7.5 — ABNORMAL HIGH (ref 7.35–7.45)
pO2, Arterial: 58 mmHg — ABNORMAL LOW (ref 83–108)

## 2024-01-28 DIAGNOSIS — Z930 Tracheostomy status: Secondary | ICD-10-CM

## 2024-01-28 DIAGNOSIS — J9621 Acute and chronic respiratory failure with hypoxia: Secondary | ICD-10-CM

## 2024-01-28 DIAGNOSIS — J449 Chronic obstructive pulmonary disease, unspecified: Secondary | ICD-10-CM

## 2024-01-28 DIAGNOSIS — I482 Chronic atrial fibrillation, unspecified: Secondary | ICD-10-CM

## 2024-01-29 ENCOUNTER — Institutional Professional Consult (permissible substitution) (HOSPITAL_COMMUNITY)

## 2024-01-29 DIAGNOSIS — J449 Chronic obstructive pulmonary disease, unspecified: Secondary | ICD-10-CM

## 2024-01-29 DIAGNOSIS — I482 Chronic atrial fibrillation, unspecified: Secondary | ICD-10-CM

## 2024-01-29 DIAGNOSIS — J9621 Acute and chronic respiratory failure with hypoxia: Secondary | ICD-10-CM

## 2024-01-29 DIAGNOSIS — Z930 Tracheostomy status: Secondary | ICD-10-CM

## 2024-01-30 ENCOUNTER — Institutional Professional Consult (permissible substitution) (HOSPITAL_COMMUNITY)

## 2024-01-30 DIAGNOSIS — J9621 Acute and chronic respiratory failure with hypoxia: Secondary | ICD-10-CM

## 2024-01-30 DIAGNOSIS — I482 Chronic atrial fibrillation, unspecified: Secondary | ICD-10-CM

## 2024-01-30 DIAGNOSIS — Z930 Tracheostomy status: Secondary | ICD-10-CM

## 2024-01-30 DIAGNOSIS — J449 Chronic obstructive pulmonary disease, unspecified: Secondary | ICD-10-CM

## 2024-01-30 LAB — BASIC METABOLIC PANEL WITH GFR
Anion gap: 9 (ref 5–15)
BUN: 27 mg/dL — ABNORMAL HIGH (ref 8–23)
CO2: 33 mmol/L — ABNORMAL HIGH (ref 22–32)
Calcium: 9.5 mg/dL (ref 8.9–10.3)
Chloride: 102 mmol/L (ref 98–111)
Creatinine, Ser: 0.46 mg/dL (ref 0.44–1.00)
GFR, Estimated: >60 mL/min (ref >60)
Glucose, Bld: 130 mg/dL — ABNORMAL HIGH (ref 70–99)
Potassium: 4.1 mmol/L (ref 3.5–5.1)
Sodium: 144 mmol/L (ref 135–145)

## 2024-01-30 LAB — CBC
HCT: 29 % — ABNORMAL LOW (ref 36.0–46.0)
Hemoglobin: 9.1 g/dL — ABNORMAL LOW (ref 12.0–15.0)
MCH: 28.3 pg (ref 26.0–34.0)
MCHC: 31.4 g/dL (ref 30.0–36.0)
MCV: 90.1 fL (ref 80.0–100.0)
Platelets: 670 10*3/uL — ABNORMAL HIGH (ref 150–400)
RBC: 3.22 MIL/uL — ABNORMAL LOW (ref 3.87–5.11)
RDW: 17.5 % — ABNORMAL HIGH (ref 11.5–15.5)
WBC: 23.8 10*3/uL — ABNORMAL HIGH (ref 4.0–10.5)
nRBC: 0.8 % — ABNORMAL HIGH (ref 0.0–0.2)

## 2024-01-31 DIAGNOSIS — J9621 Acute and chronic respiratory failure with hypoxia: Secondary | ICD-10-CM

## 2024-01-31 DIAGNOSIS — J449 Chronic obstructive pulmonary disease, unspecified: Secondary | ICD-10-CM

## 2024-01-31 DIAGNOSIS — I482 Chronic atrial fibrillation, unspecified: Secondary | ICD-10-CM

## 2024-01-31 DIAGNOSIS — Z930 Tracheostomy status: Secondary | ICD-10-CM

## 2024-02-01 LAB — VANCOMYCIN, TROUGH: Vancomycin Tr: 7 ug/mL — ABNORMAL LOW (ref 15–20)

## 2024-02-02 DIAGNOSIS — Z930 Tracheostomy status: Secondary | ICD-10-CM

## 2024-02-02 DIAGNOSIS — I482 Chronic atrial fibrillation, unspecified: Secondary | ICD-10-CM

## 2024-02-02 DIAGNOSIS — J449 Chronic obstructive pulmonary disease, unspecified: Secondary | ICD-10-CM

## 2024-02-02 DIAGNOSIS — J9621 Acute and chronic respiratory failure with hypoxia: Secondary | ICD-10-CM

## 2024-02-02 LAB — COMPREHENSIVE METABOLIC PANEL WITH GFR
ALT: 18 U/L (ref 0–44)
AST: 19 U/L (ref 15–41)
Albumin: <1.5 g/dL — ABNORMAL LOW (ref 3.5–5.0)
Alkaline Phosphatase: 99 U/L (ref 38–126)
Anion gap: 7 (ref 5–15)
BUN: 23 mg/dL (ref 8–23)
CO2: 32 mmol/L (ref 22–32)
Calcium: 8.9 mg/dL (ref 8.9–10.3)
Chloride: 103 mmol/L (ref 98–111)
Creatinine, Ser: 0.44 mg/dL (ref 0.44–1.00)
GFR, Estimated: >60 mL/min (ref >60)
Glucose, Bld: 111 mg/dL — ABNORMAL HIGH (ref 70–99)
Potassium: 4 mmol/L (ref 3.5–5.1)
Sodium: 142 mmol/L (ref 135–145)
Total Bilirubin: 0.6 mg/dL (ref 0.0–1.2)
Total Protein: 4.6 g/dL — ABNORMAL LOW (ref 6.5–8.1)

## 2024-02-02 LAB — CBC WITH DIFFERENTIAL/PLATELET
Abs Immature Granulocytes: 0.82 10*3/uL — ABNORMAL HIGH (ref 0.00–0.07)
Basophils Absolute: 0.1 10*3/uL (ref 0.0–0.1)
Basophils Relative: 1 %
Eosinophils Absolute: 0.2 10*3/uL (ref 0.0–0.5)
Eosinophils Relative: 1 %
HCT: 25.9 % — ABNORMAL LOW (ref 36.0–46.0)
Hemoglobin: 8.1 g/dL — ABNORMAL LOW (ref 12.0–15.0)
Immature Granulocytes: 5 %
Lymphocytes Relative: 6 %
Lymphs Abs: 1.1 10*3/uL (ref 0.7–4.0)
MCH: 28.7 pg (ref 26.0–34.0)
MCHC: 31.3 g/dL (ref 30.0–36.0)
MCV: 91.8 fL (ref 80.0–100.0)
Monocytes Absolute: 1.1 10*3/uL — ABNORMAL HIGH (ref 0.1–1.0)
Monocytes Relative: 7 %
Neutro Abs: 13.5 10*3/uL — ABNORMAL HIGH (ref 1.7–7.7)
Neutrophils Relative %: 80 %
Platelets: 598 10*3/uL — ABNORMAL HIGH (ref 150–400)
RBC: 2.82 MIL/uL — ABNORMAL LOW (ref 3.87–5.11)
RDW: 18 % — ABNORMAL HIGH (ref 11.5–15.5)
WBC: 16.8 10*3/uL — ABNORMAL HIGH (ref 4.0–10.5)
nRBC: 1.6 % — ABNORMAL HIGH (ref 0.0–0.2)

## 2024-02-03 ENCOUNTER — Institutional Professional Consult (permissible substitution) (HOSPITAL_COMMUNITY)

## 2024-02-03 DIAGNOSIS — I482 Chronic atrial fibrillation, unspecified: Secondary | ICD-10-CM

## 2024-02-03 DIAGNOSIS — Z930 Tracheostomy status: Secondary | ICD-10-CM

## 2024-02-03 DIAGNOSIS — J9621 Acute and chronic respiratory failure with hypoxia: Secondary | ICD-10-CM

## 2024-02-03 DIAGNOSIS — J449 Chronic obstructive pulmonary disease, unspecified: Secondary | ICD-10-CM

## 2024-02-03 LAB — VANCOMYCIN, TROUGH: Vancomycin Tr: 22 ug/mL (ref 15–20)

## 2024-02-03 MED ORDER — LIDOCAINE HCL (PF) 1 % IJ SOLN: 10.0000 mL | Freq: Once | INTRAMUSCULAR | Status: DC

## 2024-02-03 NOTE — Procedures (Signed)
 PROCEDURE SUMMARY:  Successful image-guided therapeutic left thoracentesis. Yielded 750 mL of dark yellow fluid. Patient tolerated procedure well. EBL: trace No immediate complications.  Specimen not sent for labs. Post procedure CXR ordered.  Please see imaging section of Epic for full dictation.  Damian Duke Timothea Bodenheimer PA-C 02/03/2024 12:08 PM

## 2024-02-03 NOTE — Progress Notes (Signed)
 Patient currently has DNR order in place. Discussion with the patient and family, son Mr. Braelin Brosch, regarding wishes.  The original DNR order is maintained and prior treatment limitations are upheld during the procedure.  This conversation was witnessed by Lorinda Root, PA-C.  Melainie Krinsky H Norvil Martensen PA-C 02/03/2024 9:18 AM

## 2024-02-04 DIAGNOSIS — I482 Chronic atrial fibrillation, unspecified: Secondary | ICD-10-CM

## 2024-02-04 DIAGNOSIS — J9621 Acute and chronic respiratory failure with hypoxia: Secondary | ICD-10-CM

## 2024-02-04 DIAGNOSIS — Z930 Tracheostomy status: Secondary | ICD-10-CM

## 2024-02-04 DIAGNOSIS — J449 Chronic obstructive pulmonary disease, unspecified: Secondary | ICD-10-CM

## 2024-02-04 LAB — CBC
HCT: 26.5 % — ABNORMAL LOW (ref 36.0–46.0)
Hemoglobin: 7.8 g/dL — ABNORMAL LOW (ref 12.0–15.0)
MCH: 28 pg (ref 26.0–34.0)
MCHC: 29.4 g/dL — ABNORMAL LOW (ref 30.0–36.0)
MCV: 95 fL (ref 80.0–100.0)
Platelets: 492 10*3/uL — ABNORMAL HIGH (ref 150–400)
RBC: 2.79 MIL/uL — ABNORMAL LOW (ref 3.87–5.11)
RDW: 18.4 % — ABNORMAL HIGH (ref 11.5–15.5)
WBC: 21.1 10*3/uL — ABNORMAL HIGH (ref 4.0–10.5)
nRBC: 1.2 % — ABNORMAL HIGH (ref 0.0–0.2)

## 2024-02-04 LAB — BLOOD GAS, ARTERIAL
Acid-Base Excess: 12.8 mmol/L — ABNORMAL HIGH (ref 0.0–2.0)
Bicarbonate: 44.4 mmol/L — ABNORMAL HIGH (ref 20.0–28.0)
O2 Saturation: 83.5 %
Patient temperature: 34.7
pCO2 arterial: 89 mmHg (ref 32–48)
pH, Arterial: 7.29 — ABNORMAL LOW (ref 7.35–7.45)
pO2, Arterial: 41 mmHg — ABNORMAL LOW (ref 83–108)

## 2024-02-04 LAB — TSH: TSH: 2.703 u[IU]/mL (ref 0.350–4.500)

## 2024-02-04 LAB — LACTIC ACID, PLASMA: Lactic Acid, Venous: 1 mmol/L (ref 0.5–1.9)

## 2024-02-05 DIAGNOSIS — J449 Chronic obstructive pulmonary disease, unspecified: Secondary | ICD-10-CM

## 2024-02-05 DIAGNOSIS — Z930 Tracheostomy status: Secondary | ICD-10-CM

## 2024-02-05 DIAGNOSIS — J9621 Acute and chronic respiratory failure with hypoxia: Secondary | ICD-10-CM

## 2024-02-05 DIAGNOSIS — I482 Chronic atrial fibrillation, unspecified: Secondary | ICD-10-CM

## 2024-02-05 LAB — VANCOMYCIN, TROUGH: Vancomycin Tr: 20 ug/mL (ref 15–20)

## 2024-02-05 LAB — BLOOD GAS, ARTERIAL
Acid-Base Excess: 16.7 mmol/L — ABNORMAL HIGH (ref 0.0–2.0)
Bicarbonate: 44.8 mmol/L — ABNORMAL HIGH (ref 20.0–28.0)
O2 Saturation: 100 %
Patient temperature: 35.8
pCO2 arterial: 81 mmHg (ref 32–48)
pH, Arterial: 7.35 (ref 7.35–7.45)
pO2, Arterial: 89 mmHg (ref 83–108)

## 2024-02-06 ENCOUNTER — Institutional Professional Consult (permissible substitution) (HOSPITAL_COMMUNITY)

## 2024-02-06 DIAGNOSIS — Z930 Tracheostomy status: Secondary | ICD-10-CM

## 2024-02-06 DIAGNOSIS — J449 Chronic obstructive pulmonary disease, unspecified: Secondary | ICD-10-CM

## 2024-02-06 DIAGNOSIS — I482 Chronic atrial fibrillation, unspecified: Secondary | ICD-10-CM

## 2024-02-06 DIAGNOSIS — J9621 Acute and chronic respiratory failure with hypoxia: Secondary | ICD-10-CM

## 2024-02-07 DIAGNOSIS — J9621 Acute and chronic respiratory failure with hypoxia: Secondary | ICD-10-CM

## 2024-02-07 DIAGNOSIS — J449 Chronic obstructive pulmonary disease, unspecified: Secondary | ICD-10-CM

## 2024-02-07 DIAGNOSIS — Z930 Tracheostomy status: Secondary | ICD-10-CM

## 2024-02-07 DIAGNOSIS — I482 Chronic atrial fibrillation, unspecified: Secondary | ICD-10-CM

## 2024-02-07 LAB — BASIC METABOLIC PANEL WITH GFR
Anion gap: 7 (ref 5–15)
BUN: 35 mg/dL — ABNORMAL HIGH (ref 8–23)
CO2: 36 mmol/L — ABNORMAL HIGH (ref 22–32)
Calcium: 9.2 mg/dL (ref 8.9–10.3)
Chloride: 102 mmol/L (ref 98–111)
Creatinine, Ser: 0.42 mg/dL — ABNORMAL LOW (ref 0.44–1.00)
GFR, Estimated: >60 mL/min (ref >60)
Glucose, Bld: 110 mg/dL — ABNORMAL HIGH (ref 70–99)
Potassium: 4.2 mmol/L (ref 3.5–5.1)
Sodium: 145 mmol/L (ref 135–145)

## 2024-02-07 LAB — VANCOMYCIN, TROUGH: Vancomycin Tr: 22 ug/mL (ref 15–20)

## 2024-02-07 LAB — CULTURE, RESPIRATORY W GRAM STAIN

## 2024-02-08 LAB — CBC WITH DIFFERENTIAL/PLATELET
Abs Immature Granulocytes: 0.67 10*3/uL — ABNORMAL HIGH (ref 0.00–0.07)
Basophils Absolute: 0 10*3/uL (ref 0.0–0.1)
Basophils Relative: 0 %
Eosinophils Absolute: 0.1 10*3/uL (ref 0.0–0.5)
Eosinophils Relative: 1 %
HCT: 25.6 % — ABNORMAL LOW (ref 36.0–46.0)
Hemoglobin: 7.2 g/dL — ABNORMAL LOW (ref 12.0–15.0)
Immature Granulocytes: 3 %
Lymphocytes Relative: 6 %
Lymphs Abs: 1.3 10*3/uL (ref 0.7–4.0)
MCH: 27.7 pg (ref 26.0–34.0)
MCHC: 28.1 g/dL — ABNORMAL LOW (ref 30.0–36.0)
MCV: 98.5 fL (ref 80.0–100.0)
Monocytes Absolute: 1.3 10*3/uL — ABNORMAL HIGH (ref 0.1–1.0)
Monocytes Relative: 6 %
Neutro Abs: 17.5 10*3/uL — ABNORMAL HIGH (ref 1.7–7.7)
Neutrophils Relative %: 84 %
Platelets: 477 10*3/uL — ABNORMAL HIGH (ref 150–400)
RBC: 2.6 MIL/uL — ABNORMAL LOW (ref 3.87–5.11)
RDW: 18.7 % — ABNORMAL HIGH (ref 11.5–15.5)
Smear Review: UNDETERMINED
WBC: 20.9 10*3/uL — ABNORMAL HIGH (ref 4.0–10.5)
nRBC: 2.3 % — ABNORMAL HIGH (ref 0.0–0.2)

## 2024-02-08 LAB — BASIC METABOLIC PANEL WITH GFR
Anion gap: 6 (ref 5–15)
BUN: 36 mg/dL — ABNORMAL HIGH (ref 8–23)
CO2: 43 mmol/L — ABNORMAL HIGH (ref 22–32)
Calcium: 9.7 mg/dL (ref 8.9–10.3)
Chloride: 99 mmol/L (ref 98–111)
Creatinine, Ser: 0.39 mg/dL — ABNORMAL LOW (ref 0.44–1.00)
GFR, Estimated: >60 mL/min (ref >60)
Glucose, Bld: 140 mg/dL — ABNORMAL HIGH (ref 70–99)
Potassium: 4 mmol/L (ref 3.5–5.1)
Sodium: 148 mmol/L — ABNORMAL HIGH (ref 135–145)

## 2024-02-08 LAB — MAGNESIUM: Magnesium: 2.1 mg/dL (ref 1.7–2.4)

## 2024-03-09 DEATH — deceased

## 2024-04-21 ENCOUNTER — Other Ambulatory Visit: Payer: Medicare Other

## 2024-04-28 ENCOUNTER — Ambulatory Visit: Payer: Medicare Other | Admitting: Internal Medicine
